# Patient Record
Sex: Female | Born: 1960 | Race: White | Hispanic: No | Smoking: Former smoker
Health system: Southern US, Community
[De-identification: ages and names within clinical notes are randomized; demographics above are authoritative.]

## PROBLEM LIST (undated history)

## (undated) DIAGNOSIS — I739 Peripheral vascular disease, unspecified: Secondary | ICD-10-CM

## (undated) DIAGNOSIS — I639 Cerebral infarction, unspecified: Secondary | ICD-10-CM

## (undated) DIAGNOSIS — D649 Anemia, unspecified: Secondary | ICD-10-CM

## (undated) DIAGNOSIS — C801 Malignant (primary) neoplasm, unspecified: Secondary | ICD-10-CM

## (undated) DIAGNOSIS — J189 Pneumonia, unspecified organism: Secondary | ICD-10-CM

## (undated) DIAGNOSIS — K869 Disease of pancreas, unspecified: Secondary | ICD-10-CM

## (undated) DIAGNOSIS — J449 Chronic obstructive pulmonary disease, unspecified: Secondary | ICD-10-CM

## (undated) DIAGNOSIS — R0600 Dyspnea, unspecified: Secondary | ICD-10-CM

## (undated) DIAGNOSIS — M1990 Unspecified osteoarthritis, unspecified site: Secondary | ICD-10-CM

## (undated) DIAGNOSIS — I10 Essential (primary) hypertension: Secondary | ICD-10-CM

## (undated) DIAGNOSIS — G629 Polyneuropathy, unspecified: Secondary | ICD-10-CM

## (undated) DIAGNOSIS — E039 Hypothyroidism, unspecified: Secondary | ICD-10-CM

## (undated) DIAGNOSIS — Z8489 Family history of other specified conditions: Secondary | ICD-10-CM

## (undated) HISTORY — PX: CARDIAC CATHETERIZATION: SHX172

---

## 2005-08-15 ENCOUNTER — Ambulatory Visit: Payer: Self-pay | Admitting: Internal Medicine

## 2008-04-26 ENCOUNTER — Emergency Department (HOSPITAL_BASED_OUTPATIENT_CLINIC_OR_DEPARTMENT_OTHER): Admission: EM | Admit: 2008-04-26 | Discharge: 2008-04-26 | Payer: Self-pay | Admitting: Emergency Medicine

## 2010-12-13 ENCOUNTER — Emergency Department (INDEPENDENT_AMBULATORY_CARE_PROVIDER_SITE_OTHER): Payer: BC Managed Care – PPO

## 2010-12-13 ENCOUNTER — Emergency Department (HOSPITAL_BASED_OUTPATIENT_CLINIC_OR_DEPARTMENT_OTHER)
Admission: EM | Admit: 2010-12-13 | Discharge: 2010-12-14 | Disposition: A | Discharge status: Home / Self Care | Payer: BC Managed Care – PPO | Attending: Emergency Medicine | Admitting: Emergency Medicine

## 2010-12-13 DIAGNOSIS — Z79899 Other long term (current) drug therapy: Secondary | ICD-10-CM | POA: Insufficient documentation

## 2010-12-13 DIAGNOSIS — R109 Unspecified abdominal pain: Secondary | ICD-10-CM

## 2010-12-13 DIAGNOSIS — K861 Other chronic pancreatitis: Secondary | ICD-10-CM

## 2010-12-13 DIAGNOSIS — K7689 Other specified diseases of liver: Secondary | ICD-10-CM | POA: Insufficient documentation

## 2010-12-13 DIAGNOSIS — K8689 Other specified diseases of pancreas: Secondary | ICD-10-CM | POA: Insufficient documentation

## 2010-12-13 DIAGNOSIS — E119 Type 2 diabetes mellitus without complications: Secondary | ICD-10-CM | POA: Insufficient documentation

## 2010-12-13 LAB — COMPREHENSIVE METABOLIC PANEL
AST: 18 U/L (ref 0–37)
Alkaline Phosphatase: 73 U/L (ref 39–117)
BUN: 9 mg/dL (ref 6–23)
CO2: 22 mEq/L (ref 19–32)
Creatinine, Ser: 0.6 mg/dL (ref 0.4–1.2)
GFR calc non Af Amer: >60 mL/min (ref >60)
Glucose, Bld: 314 mg/dL — ABNORMAL HIGH (ref 70–99)
Sodium: 133 mEq/L — ABNORMAL LOW (ref 135–145)
Total Bilirubin: 0.3 mg/dL (ref 0.3–1.2)

## 2010-12-13 LAB — URINALYSIS, ROUTINE W REFLEX MICROSCOPIC
Bilirubin Urine: NEGATIVE
Glucose, UA: >1000 mg/dL — AB
Leukocytes, UA: NEGATIVE
Nitrite: NEGATIVE
Protein, ur: 100 mg/dL — AB

## 2010-12-13 LAB — URINE MICROSCOPIC-ADD ON

## 2010-12-13 LAB — CBC
MCHC: 35.4 g/dL (ref 30.0–36.0)
MCV: 86.5 fL (ref 78.0–100.0)
Platelets: 264 10*3/uL (ref 150–400)
RDW: 13.4 % (ref 11.5–15.5)
WBC: 6.7 10*3/uL (ref 4.0–10.5)

## 2010-12-13 LAB — DIFFERENTIAL
Eosinophils Absolute: 0.3 10*3/uL (ref 0.0–0.7)
Eosinophils Relative: 5 % (ref 0–5)
Lymphocytes Relative: 39 % (ref 12–46)
Lymphs Abs: 2.7 10*3/uL (ref 0.7–4.0)
Monocytes Relative: 7 % (ref 3–12)
Neutro Abs: 3.3 10*3/uL (ref 1.7–7.7)

## 2010-12-13 LAB — LIPASE, BLOOD: Lipase: 5 U/L — ABNORMAL LOW (ref 11–59)

## 2010-12-14 ENCOUNTER — Encounter (HOSPITAL_BASED_OUTPATIENT_CLINIC_OR_DEPARTMENT_OTHER): Payer: Self-pay | Admitting: Radiology

## 2010-12-14 MED ORDER — IOHEXOL 300 MG/ML  SOLN
100.0000 mL | Freq: Once | INTRAMUSCULAR | Status: AC | PRN
  Administered 2010-12-14: 100 mL via INTRAVENOUS

## 2011-04-15 LAB — URINALYSIS, ROUTINE W REFLEX MICROSCOPIC
Hgb urine dipstick: NEGATIVE
Protein, ur: NEGATIVE
Specific Gravity, Urine: 1.012

## 2011-04-15 LAB — COMPREHENSIVE METABOLIC PANEL
AST: 24
Albumin: 3.7
Alkaline Phosphatase: 64
BUN: 9
Calcium: 8.9
GFR calc Af Amer: >60
Glucose, Bld: 155 — ABNORMAL HIGH
Sodium: 134 — ABNORMAL LOW
Total Protein: 6.3

## 2011-04-15 LAB — CBC
Hemoglobin: 13.2
MCHC: 34
MCV: 88.5
RBC: 4.37
RDW: 13.3
WBC: 4.5

## 2012-03-07 ENCOUNTER — Emergency Department (HOSPITAL_BASED_OUTPATIENT_CLINIC_OR_DEPARTMENT_OTHER)
Admission: EM | Admit: 2012-03-07 | Discharge: 2012-03-07 | Disposition: A | Discharge status: Home / Self Care | Payer: BC Managed Care – PPO | Attending: Emergency Medicine | Admitting: Emergency Medicine

## 2012-03-07 ENCOUNTER — Encounter (HOSPITAL_BASED_OUTPATIENT_CLINIC_OR_DEPARTMENT_OTHER): Payer: Self-pay | Admitting: *Deleted

## 2012-03-07 ENCOUNTER — Emergency Department (HOSPITAL_BASED_OUTPATIENT_CLINIC_OR_DEPARTMENT_OTHER): Payer: BC Managed Care – PPO

## 2012-03-07 DIAGNOSIS — E119 Type 2 diabetes mellitus without complications: Secondary | ICD-10-CM | POA: Insufficient documentation

## 2012-03-07 DIAGNOSIS — Z794 Long term (current) use of insulin: Secondary | ICD-10-CM | POA: Insufficient documentation

## 2012-03-07 DIAGNOSIS — R109 Unspecified abdominal pain: Secondary | ICD-10-CM | POA: Insufficient documentation

## 2012-03-07 HISTORY — DX: Polyneuropathy, unspecified: G62.9

## 2012-03-07 HISTORY — DX: Disease of pancreas, unspecified: K86.9

## 2012-03-07 LAB — URINALYSIS, ROUTINE W REFLEX MICROSCOPIC
Bilirubin Urine: NEGATIVE
Glucose, UA: 500 mg/dL — AB
Nitrite: NEGATIVE
Specific Gravity, Urine: 1.007 (ref 1.005–1.030)
Urobilinogen, UA: 0.2 mg/dL (ref 0.0–1.0)

## 2012-03-07 LAB — PREGNANCY, URINE: Preg Test, Ur: NEGATIVE

## 2012-03-07 NOTE — ED Provider Notes (Addendum)
History     CSN: [id number]  Arrival date & time 03/07/12  0025   First MD Initiated Contact with Patient 03/07/12 0309      Chief Complaint  Patient presents with  . Abdominal Pain    (Consider location/radiation/quality/duration/timing/severity/associated sxs/prior treatment) HPI This is a 51 year old white female with a history of diabetes and pancreatic excursion failure. She states that she often develops a pain in her right flank after exertion or stress. She helped her daughter move furniture 3 days ago. She has subsequently had pain in her right flank. The pain is severe, superficial, well localized and located just inferior to the ribs along the mid axillary line. It is worse with movement or palpation as well as deep breathing. There is no associated nausea, vomiting, diarrhea, fever or chills. There's been no hematuria.  Past Medical History  Diagnosis Date  . Diabetes mellitus   . Pancreas disorder   . Neuropathy     History reviewed. No pertinent past surgical history.  History reviewed. No pertinent family history.  History  Substance Use Topics  . Smoking status: Current Everyday Smoker  . Smokeless tobacco: Not on file  . Alcohol Use: No    OB History    Grav Para Term Preterm Abortions TAB SAB Ect Mult Living                  Review of Systems  All other systems reviewed and are negative.    Allergies  Review of patient's allergies indicates no known allergies.  Home Medications   Current Outpatient Rx  Name Route Sig Dispense Refill  . ASPIRIN 81 MG PO CHEW Oral Chew 81 mg by mouth daily.    Marland Kitchen VITAMIN D PO Oral Take 1 tablet by mouth.    Marland Kitchen CITALOPRAM HYDROBROMIDE 20 MG PO TABS Oral Take 20 mg by mouth daily.    Marland Kitchen GABAPENTIN 100 MG PO CAPS Oral Take 200 mg by mouth 3 (three) times daily.    . INSULIN GLARGINE 100 UNIT/ML [place] SOLN Subcutaneous Inject 20 Units into the skin daily.    . INSULIN LISPRO (HUMAN) 100 UNIT/ML [place] SOLN Subcutaneous  Inject 3-5 Units into the skin 3 (three) times daily before meals.    Marland Kitchen LOSARTAN POTASSIUM 50 MG PO TABS Oral Take 50 mg by mouth daily.    . MELOXICAM 7.5 MG PO TABS Oral Take 7.5 mg by mouth daily.    Marland Kitchen MIRABEGRON ER 25 MG PO TB24 Oral Take 25 mg by mouth daily.    . MULTIVITAMINS PO CAPS Oral Take 1 capsule by mouth daily.    Marland Kitchen PANCRELIPASE (LIP-PROT-AMYL) 24000 UNITS PO CPEP Oral Take 48,000 Units by mouth 3 (three) times daily with meals.    Marland Kitchen SIMVASTATIN 40 MG PO TABS Oral Take 40 mg by mouth every evening.      BP 167/75  Pulse 74  Temp 97.7 F (36.5 C) (Oral)  Resp 20  Ht 5\' 9"  (1.753 m)  Wt 150 lb (68.04 kg)  BMI 22.15 kg/m2  SpO2 99%  LMP 02/29/2012  Physical Exam General: Well-developed, well-nourished female in no acute distress; appearance consistent with age of record HENT: normocephalic, atraumatic Eyes: pupils equal round and reactive to light; extraocular muscles intact Neck: supple Heart: regular rate and rhythm Lungs: clear to auscultation bilaterally Abdomen: soft; nondistended; no masses or hepatosplenomegaly; bowel sounds present; Well localized pain just inferior to the ribs along the right midaxillary line without mass or swelling; lesser  tenderness laterally over right kidney Extremities: No deformity; full range of motion; pulses normal; no edema Neurologic: Awake, alert and oriented; motor function intact in all extremities and symmetric; no facial droop Skin: Warm and dry Psychiatric: Normal mood and affect    ED Course  Procedures (including critical care time)     MDM   Nursing notes and vitals signs, including pulse oximetry, reviewed.  Summary of this visit's results, reviewed by myself:  Labs:  Results for orders placed during the hospital encounter of 03/07/12  GLUCOSE, CAPILLARY      Component Value Range   Glucose-Capillary 366 (*) 70 - 99 mg/dL  URINALYSIS, ROUTINE W REFLEX MICROSCOPIC      Component Value Range   Color,  Urine YELLOW  YELLOW   APPearance CLEAR  CLEAR   Specific Gravity, Urine 1.007  1.005 - 1.030   pH 6.5  5.0 - 8.0   Glucose, UA 500 (*) NEGATIVE mg/dL   Hgb urine dipstick NEGATIVE  NEGATIVE   Bilirubin Urine NEGATIVE  NEGATIVE   Ketones, ur NEGATIVE  NEGATIVE mg/dL   Protein, ur NEGATIVE  NEGATIVE mg/dL   Urobilinogen, UA 0.2  0.0 - 1.0 mg/dL   Nitrite NEGATIVE  NEGATIVE   Leukocytes, UA NEGATIVE  NEGATIVE  PREGNANCY, URINE      Component Value Range   Preg Test, Ur NEGATIVE  NEGATIVE    Imaging Studies: Ct Abdomen Pelvis Wo Contrast  03/07/2012  *RADIOLOGY REPORT*  Clinical Data: Right flank pain  CT ABDOMEN AND PELVIS WITHOUT CONTRAST  Technique:  Multidetector CT imaging of the abdomen and pelvis was performed following the standard protocol without intravenous contrast.  Comparison: 12/13/2010  Findings: Linear opacities within the lingula and middle lobe, likely scarring.  Heart size within normal limits.  No pleural or pericardial effusion.  Organ abnormality/lesion detection is limited in the absence of intravenous contrast. Within this limitation, calcified lesions within the liver and mesentery, similar to prior, likely sequelae of prior granulomas infection.  Unremarkable spleen.  Atrophy of the pancreas with coarse calcifications which may reflect sequelae of chronic pancreatitis. Unremarkable biliary system and adrenal glands.  There is mild perinephric fat stranding, right greater than left. No hydronephrosis or hydroureter.  Unable to follow the distal ureteral course in their entirety due to the decompressed state and adjacent bowel loops.  Allowing for this, no calcification along the expected course.  No bowel obstruction.  No CT evidence for colitis.  Appendix not identified.  No right lower quadrant inflammation.  No free intraperitoneal air or fluid.  Advanced atherosclerosis of the aorta and branch vessels.  Further vascular evaluation is not possible given the absence of  intravenous contrast.  Circumferential bladder wall thickening is nonspecific.  Trace free fluid within the pelvis.  Nonspecific appearance to the uterus.  No adnexal mass identified.  No acute osseous finding.  Lumbosacral degenerative changes.  IMPRESSION: Mild perinephric fat stranding, right greater than left, may reflect ascending infection/pyelonephritis.  Correlate with urinalysis. No hydronephrosis or hydroureter.  Sequelae of prior granulomas infection.  Sequelae of chronic pancreatitis.   Original Report Authenticated By: Waneta Martins, M.D.    4:16 AM Patient advised of CT and lab findings. She declines narcotic pain medication.        Hanley Seamen, MD 03/07/12 0414  Hanley Seamen, MD 03/07/12 [phone]

## 2012-03-07 NOTE — ED Notes (Signed)
Pt states she has had episodes similar to this before, but they usually resolve. Occurs after strenuous exercise.Helped daughter move on Thursday and has had pain since. Hurts to cough, breathe and palpate. Also has hx of pancreatic failure.

## 2012-12-25 ENCOUNTER — Encounter (HOSPITAL_BASED_OUTPATIENT_CLINIC_OR_DEPARTMENT_OTHER): Payer: Self-pay | Admitting: *Deleted

## 2012-12-25 ENCOUNTER — Emergency Department (HOSPITAL_BASED_OUTPATIENT_CLINIC_OR_DEPARTMENT_OTHER)
Admission: EM | Admit: 2012-12-25 | Discharge: 2012-12-25 | Disposition: A | Discharge status: Home / Self Care | Payer: BC Managed Care – PPO | Attending: Emergency Medicine | Admitting: Emergency Medicine

## 2012-12-25 DIAGNOSIS — F17200 Nicotine dependence, unspecified, uncomplicated: Secondary | ICD-10-CM | POA: Insufficient documentation

## 2012-12-25 DIAGNOSIS — Z8719 Personal history of other diseases of the digestive system: Secondary | ICD-10-CM | POA: Insufficient documentation

## 2012-12-25 DIAGNOSIS — R209 Unspecified disturbances of skin sensation: Secondary | ICD-10-CM | POA: Insufficient documentation

## 2012-12-25 DIAGNOSIS — M5412 Radiculopathy, cervical region: Secondary | ICD-10-CM

## 2012-12-25 DIAGNOSIS — Z794 Long term (current) use of insulin: Secondary | ICD-10-CM | POA: Insufficient documentation

## 2012-12-25 DIAGNOSIS — M542 Cervicalgia: Secondary | ICD-10-CM | POA: Insufficient documentation

## 2012-12-25 DIAGNOSIS — E119 Type 2 diabetes mellitus without complications: Secondary | ICD-10-CM | POA: Insufficient documentation

## 2012-12-25 DIAGNOSIS — Z8669 Personal history of other diseases of the nervous system and sense organs: Secondary | ICD-10-CM | POA: Insufficient documentation

## 2012-12-25 DIAGNOSIS — Z79899 Other long term (current) drug therapy: Secondary | ICD-10-CM | POA: Insufficient documentation

## 2012-12-25 DIAGNOSIS — Z7982 Long term (current) use of aspirin: Secondary | ICD-10-CM | POA: Insufficient documentation

## 2012-12-25 MED ORDER — PREDNISONE 20 MG PO TABS: 60.0000 mg | ORAL_TABLET | Freq: Every day | ORAL | Status: DC

## 2012-12-25 MED ORDER — KETOROLAC TROMETHAMINE 60 MG/2ML IM SOLN
60.0000 mg | Freq: Once | INTRAMUSCULAR | Status: AC
  Administered 2012-12-25: 60 mg via INTRAMUSCULAR
  Filled 2012-12-25: qty 2

## 2012-12-25 MED ORDER — PREDNISONE 50 MG PO TABS
60.0000 mg | ORAL_TABLET | Freq: Once | ORAL | Status: AC
  Administered 2012-12-25: 60 mg via ORAL
  Filled 2012-12-25: qty 1

## 2012-12-25 MED ORDER — OXYCODONE-ACETAMINOPHEN 5-325 MG PO TABS: 1.0000 | ORAL_TABLET | Freq: Four times a day (QID) | ORAL | Status: DC | PRN

## 2012-12-25 MED ORDER — HYDROMORPHONE HCL PF 2 MG/ML IJ SOLN
2.0000 mg | Freq: Once | INTRAMUSCULAR | Status: AC
  Administered 2012-12-25: 2 mg via INTRAMUSCULAR
  Filled 2012-12-25: qty 1

## 2012-12-25 NOTE — ED Notes (Signed)
Pt states about 3 weeks ago, she was raking and started having some right shoulder pain. Took Tylenol and Ibuprofen as well as using ice. Got better, but s/s returned June 3rd. Pain radiates down arm and into hand as well as up into neck. "Weight on right side of body" Taking daughter's Dilaudid and muscle relaxers without relief. EKG done at triage.

## 2012-12-25 NOTE — ED Provider Notes (Signed)
History     This chart was scribed for Michelle Teall B. Bernette Mayers, MD by Jiles Prows, ED Scribe. The patient was seen in room MH04/MH04 and the patient's care was started at 6:50 PM.  CSN: [id number]  Arrival date & time 12/25/12  1820   Chief Complaint  Patient presents with  . Shoulder Pain   The history is provided by the patient and medical records. No language interpreter was used.   HPI Comments: Michelle Nicholson is a 52 y.o. female who presents to the Emergency Department complaining of moderate to severe, constant, radiating right shoulder pain onset 2.5 weeks ago.  Pt reports the pain radiates down her arm and her fingers are tingling.  She also states that the pain also radiates into her neck.  Pt denies headache, diaphoresis, fever, chills, nausea, vomiting, diarrhea, weakness, cough, SOB and any other pain.   Pt reports she has an appointment with [facility] on Monday.  Past Medical History  Diagnosis Date  . Diabetes mellitus   . Pancreas disorder   . Neuropathy     History reviewed. No pertinent past surgical history.  History reviewed. No pertinent family history.  History  Substance Use Topics  . Smoking status: Current Every Day Smoker  . Smokeless tobacco: Not on file  . Alcohol Use: No    OB History   Grav Para Term Preterm Abortions TAB SAB Ect Mult Living                  Review of Systems  Constitutional: Negative for fever and chills.  HENT: Positive for neck pain. Negative for sore throat and mouth sores.   Respiratory: Negative for cough and shortness of breath.   Gastrointestinal: Negative for nausea, vomiting and diarrhea.  Musculoskeletal: Negative for myalgias and back pain.  Skin: Negative for pallor and rash.  Neurological: Negative for seizures and syncope.   A complete 10 system review of systems was obtained and all systems are negative except as noted in the HPI and PMH.   Allergies  Review of patient's allergies indicates no known  allergies.  Home Medications   Current Outpatient Rx  Name  Route  Sig  Dispense  Refill  . aspirin 81 MG chewable tablet   Oral   Chew 81 mg by mouth daily.         . Cholecalciferol (VITAMIN D PO)   Oral   Take 1 tablet by mouth.         . citalopram (CELEXA) 20 MG tablet   Oral   Take 20 mg by mouth daily.         Marland Kitchen gabapentin (NEURONTIN) 100 MG capsule   Oral   Take 200 mg by mouth 3 (three) times daily.         . insulin glargine (LANTUS) 100 UNIT/ML injection   Subcutaneous   Inject 20 Units into the skin daily.         . insulin lispro (HUMALOG) 100 UNIT/ML injection   Subcutaneous   Inject 3-5 Units into the skin 3 (three) times daily before meals.         Marland Kitchen losartan (COZAAR) 50 MG tablet   Oral   Take 50 mg by mouth daily.         . meloxicam (MOBIC) 7.5 MG tablet   Oral   Take 7.5 mg by mouth daily.         . mirabegron ER (MYRBETRIQ) 25 MG TB24  Oral   Take 25 mg by mouth daily.         . Multiple Vitamin (MULTIVITAMIN) capsule   Oral   Take 1 capsule by mouth daily.         . Pancrelipase, Lip-Prot-Amyl, (CREON) 24000 UNITS CPEP   Oral   Take 48,000 Units by mouth 3 (three) times daily with meals.         . simvastatin (ZOCOR) 40 MG tablet   Oral   Take 40 mg by mouth every evening.           BP 154/60  Pulse 78  Temp(Src) 98.8 F (37.1 C) (Oral)  Resp 20  Ht 5\' 9"  (1.753 m)  Wt 155 lb (70.308 kg)  BMI 22.88 kg/m2  SpO2 98%  Physical Exam  Nursing note and vitals reviewed. Constitutional: She is oriented to person, place, and time. She appears well-developed and well-nourished.  HENT:  Head: Normocephalic and atraumatic.  Eyes: EOM are normal. Pupils are equal, round, and reactive to light.  Neck: Normal range of motion. Neck supple.  Cardiovascular: Normal rate, normal heart sounds and intact distal pulses.   Pulmonary/Chest: Effort normal and breath sounds normal.  Abdominal: Bowel sounds are normal. She  exhibits no distension. There is no tenderness.  Musculoskeletal: Normal range of motion. She exhibits tenderness (Soft tissue tenderness in right cerivcal paraspinal muscles). She exhibits no edema.  Neurological: She is alert and oriented to person, place, and time. She has normal strength. No cranial nerve deficit or sensory deficit.  Skin: Skin is warm and dry. No rash noted.  Psychiatric: She has a normal mood and affect.   ED Course  Procedures (including critical care time) DIAGNOSTIC STUDIES: Oxygen Saturation is 98% on RA, normal by my interpretation.    COORDINATION OF CARE: 6:52 PM - Discussed ED treatment with pt at bedside including pain medication and steroids and pt agrees.   Labs Reviewed - No data to display No results found.  1. Cervical radiculopathy     MDM  Pt feeling some better, suspect radiculopathy from muscle spasm. Will d/c with prednisone,percocet, she already has ortho follow up scheduled.      I personally performed the services described in this documentation, which was scribed in my presence. The recorded information has been reviewed and is accurate.     Sophi Calligan B. Bernette Mayers, MD 12/25/12 2025

## 2012-12-29 ENCOUNTER — Emergency Department (HOSPITAL_COMMUNITY): Payer: BC Managed Care – PPO

## 2012-12-29 ENCOUNTER — Encounter (HOSPITAL_COMMUNITY): Payer: Self-pay | Admitting: *Deleted

## 2012-12-29 ENCOUNTER — Emergency Department (HOSPITAL_COMMUNITY)
Admission: EM | Admit: 2012-12-29 | Discharge: 2012-12-29 | Disposition: A | Discharge status: Home / Self Care | Payer: BC Managed Care – PPO | Attending: Emergency Medicine | Admitting: Emergency Medicine

## 2012-12-29 DIAGNOSIS — I10 Essential (primary) hypertension: Secondary | ICD-10-CM | POA: Insufficient documentation

## 2012-12-29 DIAGNOSIS — Z794 Long term (current) use of insulin: Secondary | ICD-10-CM | POA: Insufficient documentation

## 2012-12-29 DIAGNOSIS — R5381 Other malaise: Secondary | ICD-10-CM | POA: Insufficient documentation

## 2012-12-29 DIAGNOSIS — E119 Type 2 diabetes mellitus without complications: Secondary | ICD-10-CM | POA: Insufficient documentation

## 2012-12-29 DIAGNOSIS — R531 Weakness: Secondary | ICD-10-CM

## 2012-12-29 DIAGNOSIS — R5383 Other fatigue: Secondary | ICD-10-CM | POA: Insufficient documentation

## 2012-12-29 DIAGNOSIS — F17200 Nicotine dependence, unspecified, uncomplicated: Secondary | ICD-10-CM | POA: Insufficient documentation

## 2012-12-29 DIAGNOSIS — Z79899 Other long term (current) drug therapy: Secondary | ICD-10-CM | POA: Insufficient documentation

## 2012-12-29 DIAGNOSIS — G609 Hereditary and idiopathic neuropathy, unspecified: Secondary | ICD-10-CM | POA: Insufficient documentation

## 2012-12-29 DIAGNOSIS — Z7982 Long term (current) use of aspirin: Secondary | ICD-10-CM | POA: Insufficient documentation

## 2012-12-29 HISTORY — DX: Essential (primary) hypertension: I10

## 2012-12-29 LAB — CBC WITH DIFFERENTIAL/PLATELET
Basophils Absolute: 0 10*3/uL (ref 0.0–0.1)
Basophils Relative: 0 % (ref 0–1)
Eosinophils Absolute: 0.1 10*3/uL (ref 0.0–0.7)
Eosinophils Relative: 1 % (ref 0–5)
HCT: 38.5 % (ref 36.0–46.0)
Hemoglobin: 13.6 g/dL (ref 12.0–15.0)
Lymphocytes Relative: 44 % (ref 12–46)
Lymphs Abs: 5.6 10*3/uL — ABNORMAL HIGH (ref 0.7–4.0)
MCH: 30.9 pg (ref 26.0–34.0)
MCHC: 35.3 g/dL (ref 30.0–36.0)
MCV: 87.5 fL (ref 78.0–100.0)
Monocytes Absolute: 0.9 10*3/uL (ref 0.1–1.0)
Monocytes Relative: 7 % (ref 3–12)
Neutro Abs: 6.2 10*3/uL (ref 1.7–7.7)
Neutrophils Relative %: 49 % (ref 43–77)
Platelets: 277 10*3/uL (ref 150–400)
RBC: 4.4 MIL/uL (ref 3.87–5.11)
RDW: 12.9 % (ref 11.5–15.5)
WBC: 12.7 10*3/uL — ABNORMAL HIGH (ref 4.0–10.5)

## 2012-12-29 LAB — URINALYSIS, ROUTINE W REFLEX MICROSCOPIC
Glucose, UA: 100 mg/dL — AB
Ketones, ur: NEGATIVE mg/dL
Nitrite: NEGATIVE
Protein, ur: 100 mg/dL — AB
Specific Gravity, Urine: 1.028 (ref 1.005–1.030)
Urobilinogen, UA: 1 mg/dL (ref 0.0–1.0)
pH: 6.5 (ref 5.0–8.0)

## 2012-12-29 LAB — URINE MICROSCOPIC-ADD ON

## 2012-12-29 LAB — CG4 I-STAT (LACTIC ACID): Lactic Acid, Venous: 1.26 mmol/L (ref 0.5–2.2)

## 2012-12-29 LAB — COMPREHENSIVE METABOLIC PANEL
ALT: 19 U/L (ref 0–35)
AST: 14 U/L (ref 0–37)
Albumin: 3.2 g/dL — ABNORMAL LOW (ref 3.5–5.2)
Alkaline Phosphatase: 64 U/L (ref 39–117)
BUN: 19 mg/dL (ref 6–23)
CO2: 25 mEq/L (ref 19–32)
Calcium: 8.5 mg/dL (ref 8.4–10.5)
Chloride: 101 mEq/L (ref 96–112)
Creatinine, Ser: 0.78 mg/dL (ref 0.50–1.10)
GFR calc Af Amer: >90 mL/min (ref >90)
GFR calc non Af Amer: >90 mL/min (ref >90)
Glucose, Bld: 154 mg/dL — ABNORMAL HIGH (ref 70–99)
Potassium: 3.4 mEq/L — ABNORMAL LOW (ref 3.5–5.1)
Sodium: 137 mEq/L (ref 135–145)
Total Bilirubin: 0.3 mg/dL (ref 0.3–1.2)
Total Protein: 6.2 g/dL (ref 6.0–8.3)

## 2012-12-29 LAB — GLUCOSE, CAPILLARY: Glucose-Capillary: 161 mg/dL — ABNORMAL HIGH (ref 70–99)

## 2012-12-29 LAB — TROPONIN I: Troponin I: <0.3 ng/mL (ref <0.30)

## 2012-12-29 MED ORDER — SODIUM CHLORIDE 0.9 % IV BOLUS (SEPSIS)
1000.0000 mL | Freq: Once | INTRAVENOUS | Status: AC
  Administered 2012-12-29: 1000 mL via INTRAVENOUS

## 2012-12-29 MED ORDER — NALOXONE HCL 0.4 MG/ML IJ SOLN
0.4000 mg | Freq: Once | INTRAMUSCULAR | Status: AC
  Administered 2012-12-29: 0.4 mg via INTRAVENOUS
  Filled 2012-12-29: qty 1

## 2012-12-29 MED ORDER — ONDANSETRON HCL 4 MG/2ML IJ SOLN
4.0000 mg | Freq: Once | INTRAMUSCULAR | Status: AC
  Administered 2012-12-29: 4 mg via INTRAVENOUS
  Filled 2012-12-29: qty 2

## 2012-12-29 NOTE — ED Provider Notes (Signed)
History    51yf brought in for evaluation after seeming very tired at [employer] [address] through and then subsequently driving over the curb. Pt states she feels tired. C/o neck pain as well, but this is chronic. Taking percocet for "pinched nerves." Unable/unwilling to tell how many specifically ingested today. Denies overdose though. Denies new pain complaints. NO fever or chills. Did not eat yet today. No sob. No urinary complaints. No recent falls.  CSN: [id number]  Arrival date & time 12/29/12  1406   First MD Initiated Contact with Patient 12/29/12 1413      Chief Complaint  Patient presents with  . Hypotension    (Consider location/radiation/quality/duration/timing/severity/associated sxs/prior treatment) HPI  Past Medical History  Diagnosis Date  . Diabetes mellitus   . Pancreas disorder   . Neuropathy   . Hypertension     History reviewed. No pertinent past surgical history.  History reviewed. No pertinent family history.  History  Substance Use Topics  . Smoking status: Current Every Day Smoker  . Smokeless tobacco: Not on file  . Alcohol Use: No    OB History   Grav Para Term Preterm Abortions TAB SAB Ect Mult Living                  Review of Systems  All systems reviewed and negative, other than as noted in HPI.  Allergies  Review of patient's allergies indicates no known allergies.  Home Medications   Current Outpatient Rx  Name  Route  Sig  Dispense  Refill  . aspirin 81 MG chewable tablet   Oral   Chew 81 mg by mouth daily.         . Cholecalciferol (VITAMIN D PO)   Oral   Take 1 tablet by mouth.         . citalopram (CELEXA) 20 MG tablet   Oral   Take 20 mg by mouth daily.         Marland Kitchen gabapentin (NEURONTIN) 100 MG capsule   Oral   Take 200 mg by mouth 3 (three) times daily.         . insulin glargine (LANTUS) 100 UNIT/ML injection   Subcutaneous   Inject 20 Units into the skin daily.         . insulin lispro (HUMALOG) 100  UNIT/ML injection   Subcutaneous   Inject 3-5 Units into the skin 3 (three) times daily before meals.         Marland Kitchen losartan (COZAAR) 50 MG tablet   Oral   Take 50 mg by mouth daily.         . meloxicam (MOBIC) 7.5 MG tablet   Oral   Take 7.5 mg by mouth daily.         . mirabegron ER (MYRBETRIQ) 25 MG TB24   Oral   Take 25 mg by mouth daily.         . Multiple Vitamin (MULTIVITAMIN) capsule   Oral   Take 1 capsule by mouth daily.         Marland Kitchen oxyCODONE-acetaminophen (PERCOCET/ROXICET) 5-325 MG per tablet   Oral   Take 1-2 tablets by mouth every 6 (six) hours as needed for pain.   20 tablet   0   . Pancrelipase, Lip-Prot-Amyl, (CREON) 24000 UNITS CPEP   Oral   Take 48,000 Units by mouth 3 (three) times daily with meals.         . predniSONE (DELTASONE) 20 MG tablet   Oral  Take 3 tablets (60 mg total) by mouth daily.   15 tablet   0   . simvastatin (ZOCOR) 40 MG tablet   Oral   Take 40 mg by mouth every evening.           BP 119/60  Pulse 55  Temp(Src) 97.6 F (36.4 C) (Oral)  Resp 20  SpO2 96%  Physical Exam  Nursing note and vitals reviewed. Constitutional: She is oriented to person, place, and time. She appears well-developed and well-nourished. No distress.  HENT:  Head: Normocephalic and atraumatic.  Eyes: Conjunctivae are normal. Right eye exhibits no discharge. Left eye exhibits no discharge.  Neck: Neck supple.  Cardiovascular: Normal rate, regular rhythm and normal heart sounds.  Exam reveals no gallop and no friction rub.   No murmur heard. Pulmonary/Chest: Effort normal and breath sounds normal. No respiratory distress.  Abdominal: Soft. She exhibits no distension. There is no tenderness.  Musculoskeletal: She exhibits no edema and no tenderness.  Neurological: She is oriented to person, place, and time. No cranial nerve deficit. She exhibits normal muscle tone. Coordination normal.  Drowsy but answers questions appropriately. No  deficits noted.   Skin: Skin is warm and dry.  Psychiatric: She has a normal mood and affect. Her behavior is normal. Thought content normal.    ED Course  Procedures (including critical care time)  Labs Reviewed  GLUCOSE, CAPILLARY - Abnormal; Notable for the following:    Glucose-Capillary 161 (*)    All other components within normal limits  COMPREHENSIVE METABOLIC PANEL - Abnormal; Notable for the following:    Potassium 3.4 (*)    Glucose, Bld 154 (*)    Albumin 3.2 (*)    All other components within normal limits  CBC WITH DIFFERENTIAL - Abnormal; Notable for the following:    WBC 12.7 (*)    Lymphs Abs 5.6 (*)    All other components within normal limits  TROPONIN I  URINALYSIS, ROUTINE W REFLEX MICROSCOPIC  CG4 I-STAT (LACTIC ACID)   Dg Chest Portable 1 View  12/29/2012   *RADIOLOGY REPORT*  Clinical Data: Hypotension  PORTABLE CHEST - 1 VIEW  Comparison: None.  Findings: Frontal view of the chest was obtained and reveals heart pulmonary vascularity be within normal limits.  The lungs are clear bilaterally.  No bony abnormality is seen.  IMPRESSION: No acute abnormality noted.   Original Report Authenticated By: Alcide Clever, M.D.   EKG:  Rhythm: sinus bradycardia Vent. rate 56 BPM PR interval 156 ms QRS duration 88 ms QT/QTc 436/421 ms ST segments: NS ST changes   1. Generalized weakness       MDM  51yF with generalized weakness/fatigue. Suspect pt overmedicated. W/u fairly unremarkable. UA with many bacteria, but also many squamous cells. W/o specific urinary complaints, tx deferred. Pt HD stable. She ambulated slowly, but w/o assistance prior to DC.  Protecting airway. Pt cautioned about potential sedating effects of meds. I feel safe for DC at this time.         Raeford Razor, MD 01/06/13 [phone]

## 2012-12-29 NOTE — ED Notes (Signed)
md at bedside

## 2012-12-29 NOTE — ED Notes (Addendum)
Per ems pt reports she started taking pain medication for pinched nerves on Saturday. Today pt went through [employer] [address] through, staff thought she was acting off, she jumped the curve and went into another parking lot. ems was called. Upon arrival. Pt hypotensive and lethargic. 18 G L AC, 1 L NS infused. CBG 182. Pt more alert at present, answering questions appropriately. Blood pressure elevated to 138/63.   Hx of DM, has not taken insulin dose today.   Daughter called and will come to ED. Daughter phone number [phone]

## 2012-12-29 NOTE — Progress Notes (Signed)
pt is seen by a Dr Michelle Nicholson in [place] per her daughter, Michelle Nicholson

## 2012-12-29 NOTE — ED Notes (Signed)
Pt ambulated to restroom, states she feels dizzy and nauseous, pt ambulated by self, became unsteady a couple times.

## 2012-12-29 NOTE — ED Notes (Signed)
[id number]<JY> Expected date:<BR> Expected time:<BR> Means of arrival:<BR> Comments:<BR> ems- hypotension, altered in car, weakness

## 2012-12-31 LAB — URINE CULTURE: Colony Count: >=100000

## 2013-01-02 ENCOUNTER — Encounter (HOSPITAL_BASED_OUTPATIENT_CLINIC_OR_DEPARTMENT_OTHER): Payer: Self-pay | Admitting: *Deleted

## 2013-01-02 ENCOUNTER — Emergency Department (HOSPITAL_BASED_OUTPATIENT_CLINIC_OR_DEPARTMENT_OTHER): Payer: BC Managed Care – PPO

## 2013-01-02 ENCOUNTER — Emergency Department (HOSPITAL_BASED_OUTPATIENT_CLINIC_OR_DEPARTMENT_OTHER)
Admission: EM | Admit: 2013-01-02 | Discharge: 2013-01-02 | Disposition: A | Discharge status: Home / Self Care | Payer: BC Managed Care – PPO | Attending: Emergency Medicine | Admitting: Emergency Medicine

## 2013-01-02 DIAGNOSIS — S60219A Contusion of unspecified wrist, initial encounter: Secondary | ICD-10-CM | POA: Insufficient documentation

## 2013-01-02 DIAGNOSIS — Z791 Long term (current) use of non-steroidal anti-inflammatories (NSAID): Secondary | ICD-10-CM | POA: Insufficient documentation

## 2013-01-02 DIAGNOSIS — IMO0002 Reserved for concepts with insufficient information to code with codable children: Secondary | ICD-10-CM | POA: Insufficient documentation

## 2013-01-02 DIAGNOSIS — Y929 Unspecified place or not applicable: Secondary | ICD-10-CM | POA: Insufficient documentation

## 2013-01-02 DIAGNOSIS — Z794 Long term (current) use of insulin: Secondary | ICD-10-CM | POA: Insufficient documentation

## 2013-01-02 DIAGNOSIS — F17200 Nicotine dependence, unspecified, uncomplicated: Secondary | ICD-10-CM | POA: Insufficient documentation

## 2013-01-02 DIAGNOSIS — S52599A Other fractures of lower end of unspecified radius, initial encounter for closed fracture: Secondary | ICD-10-CM | POA: Insufficient documentation

## 2013-01-02 DIAGNOSIS — E119 Type 2 diabetes mellitus without complications: Secondary | ICD-10-CM | POA: Insufficient documentation

## 2013-01-02 DIAGNOSIS — I10 Essential (primary) hypertension: Secondary | ICD-10-CM | POA: Insufficient documentation

## 2013-01-02 DIAGNOSIS — Z7982 Long term (current) use of aspirin: Secondary | ICD-10-CM | POA: Insufficient documentation

## 2013-01-02 DIAGNOSIS — Z8719 Personal history of other diseases of the digestive system: Secondary | ICD-10-CM | POA: Insufficient documentation

## 2013-01-02 DIAGNOSIS — Z8669 Personal history of other diseases of the nervous system and sense organs: Secondary | ICD-10-CM | POA: Insufficient documentation

## 2013-01-02 DIAGNOSIS — Y9389 Activity, other specified: Secondary | ICD-10-CM | POA: Insufficient documentation

## 2013-01-02 DIAGNOSIS — Z79899 Other long term (current) drug therapy: Secondary | ICD-10-CM | POA: Insufficient documentation

## 2013-01-02 DIAGNOSIS — R296 Repeated falls: Secondary | ICD-10-CM | POA: Insufficient documentation

## 2013-01-02 DIAGNOSIS — S52501A Unspecified fracture of the lower end of right radius, initial encounter for closed fracture: Secondary | ICD-10-CM

## 2013-01-02 MED ORDER — HYDROCODONE-ACETAMINOPHEN 5-325 MG PO TABS
1.0000 | ORAL_TABLET | Freq: Once | ORAL | Status: AC
  Administered 2013-01-02: 1 via ORAL
  Filled 2013-01-02: qty 1

## 2013-01-02 MED ORDER — HYDROCODONE-ACETAMINOPHEN 5-325 MG PO TABS: 1.0000 | ORAL_TABLET | ORAL | Status: DC | PRN

## 2013-01-02 NOTE — ED Notes (Signed)
Fell onto her right arm last night. Now c/o pain, swelling, bruising to right wrist

## 2013-01-02 NOTE — ED Notes (Signed)
Called to get disk made for to go with patient

## 2013-01-02 NOTE — ED Provider Notes (Signed)
History    Scribed for Michelle Cooper III, MD, the patient was seen in room MH01/MH01. This chart was scribed by Lewanda Rife, ED scribe. Patient's care was started at 1754   CSN: [id number]  Arrival date & time 01/02/13  1656   None     Chief Complaint  Patient presents with  . Arm Injury    (Consider location/radiation/quality/duration/timing/severity/associated sxs/prior treatment) The history is provided by the patient.   HPI Comments: Michelle Nicholson is a 52 y.o. female who presents to the Emergency Department complaining of constant moderate, non-radiating right wrist pain onset acute last night after breaking her fall with outstretched hand right hand. Reports associated right wrist swelling, and bruising. Reports symptoms are aggravated with touch, movement and alleviated by nothing. Denies associated head injury, or other related injuries. Reports she sees an orthopedist for her right shoulder and has an appointment in 2 days.   Past Medical History  Diagnosis Date  . Diabetes mellitus   . Pancreas disorder   . Neuropathy   . Hypertension     History reviewed. No pertinent past surgical history.  No family history on file.  History  Substance Use Topics  . Smoking status: Current Every Day Smoker  . Smokeless tobacco: Not on file  . Alcohol Use: No    OB History   Grav Para Term Preterm Abortions TAB SAB Ect Mult Living                  Review of Systems  Musculoskeletal: Positive for myalgias (right wrist ).  Skin: Positive for wound (brusing ).  Neurological: Negative for numbness.  Psychiatric/Behavioral: Negative for confusion.   A complete 10 system review of systems was obtained and all systems are negative except as noted in the HPI and PMH.    Allergies  Review of patient's allergies indicates no known allergies.  Home Medications   Current Outpatient Rx  Name  Route  Sig  Dispense  Refill  . aspirin 81 MG chewable tablet   Oral   Chew 81  mg by mouth daily.         . Cholecalciferol (VITAMIN D PO)   Oral   Take 1 tablet by mouth.         . citalopram (CELEXA) 20 MG tablet   Oral   Take 20 mg by mouth daily.         Marland Kitchen gabapentin (NEURONTIN) 100 MG capsule   Oral   Take 200 mg by mouth 3 (three) times daily.         . insulin glargine (LANTUS) 100 UNIT/ML injection   Subcutaneous   Inject 20 Units into the skin daily.         . insulin lispro (HUMALOG) 100 UNIT/ML injection   Subcutaneous   Inject 3-5 Units into the skin 3 (three) times daily before meals.         Marland Kitchen losartan (COZAAR) 50 MG tablet   Oral   Take 50 mg by mouth daily.         . meloxicam (MOBIC) 7.5 MG tablet   Oral   Take 7.5 mg by mouth daily.         . mirabegron ER (MYRBETRIQ) 25 MG TB24   Oral   Take 25 mg by mouth daily.         . Multiple Vitamin (MULTIVITAMIN) capsule   Oral   Take 1 capsule by mouth daily.         Marland Kitchen  oxyCODONE-acetaminophen (PERCOCET/ROXICET) 5-325 MG per tablet   Oral   Take 1-2 tablets by mouth every 6 (six) hours as needed for pain.   20 tablet   0   . Pancrelipase, Lip-Prot-Amyl, (CREON) 24000 UNITS CPEP   Oral   Take 48,000 Units by mouth 3 (three) times daily with meals.         . predniSONE (DELTASONE) 20 MG tablet   Oral   Take 3 tablets (60 mg total) by mouth daily.   15 tablet   0   . simvastatin (ZOCOR) 40 MG tablet   Oral   Take 40 mg by mouth every evening.           There were no vitals taken for this visit.  Physical Exam  Nursing note and vitals reviewed. Constitutional: She is oriented to person, place, and time. She appears well-developed and well-nourished. No distress.  HENT:  Head: Normocephalic and atraumatic.  Eyes: Conjunctivae and EOM are normal.  Neck: Neck supple. No tracheal deviation present.  Cardiovascular: Normal rate and intact distal pulses.   Pulses:      Radial pulses are 2+ on the right side.  Pulmonary/Chest: Effort normal. No  respiratory distress.  Musculoskeletal: She exhibits tenderness.       Right wrist: She exhibits decreased range of motion (secondary to pain ), tenderness, bony tenderness, swelling and deformity. She exhibits no laceration.  Ecchymosis noted on volar surface of right forearm, deformity noted on dorsum of right wrist primarily over distal radius, intact pulse, sensation, and tendon function  Neurological: She is alert and oriented to person, place, and time.  Skin: Skin is warm and dry.  Psychiatric: She has a normal mood and affect. Her behavior is normal.    ED Course  Procedures (including critical care time) Medications  HYDROcodone-acetaminophen (NORCO/VICODIN) 5-325 MG per tablet 1 tablet (1 tablet Oral Given 01/02/13 1810)     Labs Reviewed - No data to display Dg Wrist Complete Right  01/02/2013   *RADIOLOGY REPORT*  Clinical Data: Wrist pain, swelling, and bruising secondary to a fall last night.  RIGHT WRIST - COMPLETE 3+ VIEW  Comparison: None.  Findings: There is a slightly impacted fracture of the metaphysis of the distal radius.  There is a small avulsion of the ulnar styloid.  Minimal arthritic changes of the first carpal metacarpal joint.  IMPRESSION: Fractures of the distal right radius and ulna.   Original Report Authenticated By: Francene Boyers, M.D.   Rx splint, hydrocodone-acetaminophen q4h prn pain, F/U with [employer] tomorrow or Tuesday.  1. Fracture of right distal radius, closed, initial encounter    I personally performed the services described in this documentation, which was scribed in my presence. The recorded information has been reviewed and is accurate.  Osvaldo Human, M.D.     Michelle Cooper III, MD 01/03/13 [phone]

## 2015-07-15 DIAGNOSIS — I639 Cerebral infarction, unspecified: Secondary | ICD-10-CM

## 2015-07-15 HISTORY — DX: Cerebral infarction, unspecified: I63.9

## 2015-12-08 ENCOUNTER — Encounter (HOSPITAL_BASED_OUTPATIENT_CLINIC_OR_DEPARTMENT_OTHER): Payer: Self-pay | Admitting: Emergency Medicine

## 2015-12-08 ENCOUNTER — Observation Stay (HOSPITAL_BASED_OUTPATIENT_CLINIC_OR_DEPARTMENT_OTHER)
Admission: EM | Admit: 2015-12-08 | Discharge: 2015-12-10 | Disposition: A | Discharge status: Home / Self Care | Payer: BC Managed Care – PPO | Attending: Family Medicine | Admitting: Family Medicine

## 2015-12-08 ENCOUNTER — Emergency Department (HOSPITAL_BASED_OUTPATIENT_CLINIC_OR_DEPARTMENT_OTHER): Payer: BC Managed Care – PPO

## 2015-12-08 DIAGNOSIS — H5500 Unspecified nystagmus: Secondary | ICD-10-CM | POA: Diagnosis not present

## 2015-12-08 DIAGNOSIS — Z794 Long term (current) use of insulin: Secondary | ICD-10-CM | POA: Insufficient documentation

## 2015-12-08 DIAGNOSIS — Z7982 Long term (current) use of aspirin: Secondary | ICD-10-CM | POA: Diagnosis not present

## 2015-12-08 DIAGNOSIS — F17200 Nicotine dependence, unspecified, uncomplicated: Secondary | ICD-10-CM | POA: Diagnosis not present

## 2015-12-08 DIAGNOSIS — E039 Hypothyroidism, unspecified: Secondary | ICD-10-CM | POA: Diagnosis not present

## 2015-12-08 DIAGNOSIS — E1140 Type 2 diabetes mellitus with diabetic neuropathy, unspecified: Secondary | ICD-10-CM | POA: Diagnosis not present

## 2015-12-08 DIAGNOSIS — H9191 Unspecified hearing loss, right ear: Secondary | ICD-10-CM

## 2015-12-08 DIAGNOSIS — E871 Hypo-osmolality and hyponatremia: Secondary | ICD-10-CM | POA: Diagnosis not present

## 2015-12-08 DIAGNOSIS — H8109 Meniere's disease, unspecified ear: Secondary | ICD-10-CM | POA: Diagnosis present

## 2015-12-08 DIAGNOSIS — I10 Essential (primary) hypertension: Secondary | ICD-10-CM | POA: Diagnosis not present

## 2015-12-08 DIAGNOSIS — H8101 Meniere's disease, right ear: Secondary | ICD-10-CM | POA: Diagnosis not present

## 2015-12-08 DIAGNOSIS — R42 Dizziness and giddiness: Secondary | ICD-10-CM

## 2015-12-08 DIAGNOSIS — E1165 Type 2 diabetes mellitus with hyperglycemia: Secondary | ICD-10-CM | POA: Diagnosis not present

## 2015-12-08 DIAGNOSIS — R270 Ataxia, unspecified: Secondary | ICD-10-CM

## 2015-12-08 LAB — CBC WITH DIFFERENTIAL/PLATELET
BASOS PCT: 0 %
Basophils Absolute: 0 10*3/uL (ref 0.0–0.1)
EOS ABS: 0.3 10*3/uL (ref 0.0–0.7)
EOS PCT: 4 %
HCT: 37.7 % (ref 36.0–46.0)
Hemoglobin: 13 g/dL (ref 12.0–15.0)
Lymphocytes Relative: 29 %
Lymphs Abs: 2.5 10*3/uL (ref 0.7–4.0)
MCH: 30.4 pg (ref 26.0–34.0)
MCHC: 34.5 g/dL (ref 30.0–36.0)
MCV: 88.3 fL (ref 78.0–100.0)
MONO ABS: 0.6 10*3/uL (ref 0.1–1.0)
MONOS PCT: 7 %
Neutro Abs: 5.3 10*3/uL (ref 1.7–7.7)
Neutrophils Relative %: 60 %
Platelets: 320 10*3/uL (ref 150–400)
RBC: 4.27 MIL/uL (ref 3.87–5.11)
RDW: 13.3 % (ref 11.5–15.5)
WBC: 8.6 10*3/uL (ref 4.0–10.5)

## 2015-12-08 LAB — COMPREHENSIVE METABOLIC PANEL
ALBUMIN: 4 g/dL (ref 3.5–5.0)
ALT: 32 U/L (ref 14–54)
ANION GAP: 9 (ref 5–15)
AST: 27 U/L (ref 15–41)
Alkaline Phosphatase: 96 U/L (ref 38–126)
BILIRUBIN TOTAL: 0.4 mg/dL (ref 0.3–1.2)
BUN: 16 mg/dL (ref 6–20)
CO2: 25 mmol/L (ref 22–32)
Calcium: 9.3 mg/dL (ref 8.9–10.3)
Chloride: 99 mmol/L — ABNORMAL LOW (ref 101–111)
Creatinine, Ser: 0.88 mg/dL (ref 0.44–1.00)
GFR calc Af Amer: >60 mL/min (ref >60)
GFR calc non Af Amer: >60 mL/min (ref >60)
GLUCOSE: 215 mg/dL — AB (ref 65–99)
POTASSIUM: 3.8 mmol/L (ref 3.5–5.1)
SODIUM: 133 mmol/L — AB (ref 135–145)
TOTAL PROTEIN: 7.4 g/dL (ref 6.5–8.1)

## 2015-12-08 LAB — CBG MONITORING, ED: Glucose-Capillary: 141 mg/dL — ABNORMAL HIGH (ref 65–99)

## 2015-12-08 MED ORDER — ONDANSETRON HCL 4 MG/2ML IJ SOLN
4.0000 mg | Freq: Once | INTRAMUSCULAR | Status: AC
  Administered 2015-12-08: 4 mg via INTRAVENOUS
  Filled 2015-12-08: qty 2

## 2015-12-08 MED ORDER — MECLIZINE HCL 25 MG PO TABS
25.0000 mg | ORAL_TABLET | Freq: Once | ORAL | Status: AC
  Administered 2015-12-08: 25 mg via ORAL
  Filled 2015-12-08: qty 1

## 2015-12-08 NOTE — ED Provider Notes (Signed)
CSN: [id number]     Arrival date & time 12/08/15  1833 History  By signing my name below, I, Georgette Shell, attest that this documentation has been prepared under the direction and in the presence of Malvin Johns, MD. Electronically Signed: Georgette Shell, ED Scribe. 12/08/2015. 7:40 PM.   Chief Complaint  Patient presents with  . Ear Fullness    The history is provided by the patient. No language interpreter was used.    HPI Comments: Gicela Sancen is a 55 y.o. female with a h/x of diabetes who presents to the Emergency Department complaining of sudden onset right-sided tinnitus onset this evening with associated decreased hearing on the right. Pt states that she was working outside today, and is not sure if a bug flew into her ear causing her symptoms. Patient also reports she fell and struck her left forehead on the ground a week ago due to low blood sugar and had a negative head CT at that time. Patient denies trouble with speaking and balance, numbness and tingling in arms and legs, nausea, neck pain, rhinorrhea, and congestion.   Past Medical History  Diagnosis Date  . Diabetes mellitus   . Pancreas disorder   . Neuropathy ([place])   . Hypertension    History reviewed. No pertinent past surgical history. History reviewed. No pertinent family history. Social History  Substance Use Topics  . Smoking status: Current Every Day Smoker  . Smokeless tobacco: None  . Alcohol Use: No   OB History    No data available     Review of Systems  Constitutional: Negative for fever, chills, diaphoresis and fatigue.  HENT: Positive for hearing loss and tinnitus (right). Negative for congestion, ear pain, rhinorrhea and sneezing.   Eyes: Negative.   Respiratory: Negative for cough, chest tightness and shortness of breath.   Cardiovascular: Negative for chest pain and leg swelling.  Gastrointestinal: Negative for nausea, vomiting, abdominal pain, diarrhea and blood in stool.  Genitourinary: Negative for  frequency, hematuria, flank pain and difficulty urinating.  Musculoskeletal: Negative for back pain and arthralgias.  Skin: Negative for rash.  Neurological: Negative for dizziness, speech difficulty, weakness, numbness and headaches.     Allergies  Review of patient's allergies indicates no known allergies.  Home Medications   Prior to Admission medications   Medication Sig Start Date End Date Taking? Authorizing Provider  aspirin 81 MG chewable tablet Chew 81 mg by mouth daily.    Historical Provider, MD  Cholecalciferol (VITAMIN D PO) Take 1 tablet by mouth.    Historical Provider, MD  citalopram (CELEXA) 20 MG tablet Take 20 mg by mouth daily.    Historical Provider, MD  gabapentin (NEURONTIN) 100 MG capsule Take 200 mg by mouth 3 (three) times daily.    Historical Provider, MD  HYDROcodone-acetaminophen (NORCO/VICODIN) 5-325 MG per tablet Take 1 tablet by mouth every 4 (four) hours as needed for pain. 01/02/13   Mylinda Latina, MD  insulin glargine (LANTUS) 100 UNIT/ML injection Inject 20 Units into the skin daily.    Historical Provider, MD  insulin lispro (HUMALOG) 100 UNIT/ML injection Inject 3-5 Units into the skin 3 (three) times daily before meals.    Historical Provider, MD  losartan (COZAAR) 50 MG tablet Take 50 mg by mouth daily.    Historical Provider, MD  meloxicam (MOBIC) 7.5 MG tablet Take 7.5 mg by mouth daily.    Historical Provider, MD  mirabegron ER (MYRBETRIQ) 25 MG TB24 Take 25 mg by mouth daily.  Historical Provider, MD  Multiple Vitamin (MULTIVITAMIN) capsule Take 1 capsule by mouth daily.    Historical Provider, MD  oxyCODONE-acetaminophen (PERCOCET/ROXICET) 5-325 MG per tablet Take 1-2 tablets by mouth every 6 (six) hours as needed for pain. 12/25/12   Calvert Cantor, MD  Pancrelipase, Lip-Prot-Amyl, (CREON) 24000 UNITS CPEP Take 48,000 Units by mouth 3 (three) times daily with meals.    Historical Provider, MD  predniSONE (DELTASONE) 20 MG tablet Take 3 tablets  (60 mg total) by mouth daily. 12/25/12   Calvert Cantor, MD  simvastatin (ZOCOR) 40 MG tablet Take 40 mg by mouth every evening.    Historical Provider, MD   BP 185/107 mmHg  Pulse 70  Temp(Src) 97.8 F (36.6 C) (Oral)  Resp 14  Ht 5\' 9"  (1.753 m)  Wt 150 lb (68.04 kg)  BMI 22.14 kg/m2  SpO2 97% Physical Exam  Constitutional: She is oriented to person, place, and time. She appears well-developed and well-nourished.  HENT:  Head: Normocephalic and atraumatic.  Right Ear: Tympanic membrane normal. No foreign bodies. Tympanic membrane is not erythematous and not bulging. No middle ear effusion.  Left Ear: Tympanic membrane normal.  Eyes: Pupils are equal, round, and reactive to light.  Neck: Normal range of motion. Neck supple.  Cardiovascular: Normal rate, regular rhythm and normal heart sounds.   Pulmonary/Chest: Effort normal and breath sounds normal. No respiratory distress. She has no wheezes. She has no rales. She exhibits no tenderness.  Abdominal: Soft. Bowel sounds are normal. There is no tenderness. There is no rebound and no guarding.  Musculoskeletal: Normal range of motion. She exhibits no edema.  Lymphadenopathy:    She has no cervical adenopathy.  Neurological: She is alert and oriented to person, place, and time. She has normal strength. No sensory deficit. GCS eye subscore is 4. GCS verbal subscore is 5. GCS motor subscore is 6.  Motor 5 out of 5 all extremities, sensation grossly intact to light touch all extremities, finger to nose intact, no pronator drift, no facial drooping, no speech deficits, I movements intact, normal tongue protrusion, normal shoulder shrug, normal sensation in the face to light touch. Patient does have ataxia on ambulation  Skin: Skin is warm and dry. No rash noted.  Psychiatric: She has a normal mood and affect.  Nursing note and vitals reviewed.   ED Course  Procedures (including critical care time) DIAGNOSTIC STUDIES: Oxygen Saturation  is 100% on room air, normal by my interpretation.    COORDINATION OF CARE: 7:31 PM Discussed treatment plan with pt which includes head CT and blood work at bedside and pt agreed to plan.   Labs Review Labs Reviewed  COMPREHENSIVE METABOLIC PANEL - Abnormal; Notable for the following:    Sodium 133 (*)    Chloride 99 (*)    Glucose, Bld 215 (*)    All other components within normal limits  CBG MONITORING, ED - Abnormal; Notable for the following:    Glucose-Capillary 141 (*)    All other components within normal limits  CBC WITH DIFFERENTIAL/PLATELET    Imaging Review Ct Head Wo Contrast  12/08/2015  CLINICAL DATA:  Acute onset of right-sided hearing loss and status. Hyperglycemia. Recent fall with syncope. Initial encounter. EXAM: CT HEAD WITHOUT CONTRAST TECHNIQUE: Contiguous axial images were obtained from the base of the skull through the vertex without intravenous contrast. COMPARISON:  None. FINDINGS: There is no evidence of acute infarction, mass lesion, or intra- or extra-axial hemorrhage on CT. The posterior fossa,  including the cerebellum, brainstem and fourth ventricle, is within normal limits. The third and lateral ventricles, and basal ganglia are unremarkable in appearance. The cerebral hemispheres are symmetric in appearance, with normal gray-white differentiation. No mass effect or midline shift is seen. There is no evidence of fracture; visualized osseous structures are unremarkable in appearance. The visualized portions of the orbits are within normal limits. There is minimal partial opacification of the right mastoid air cells. The paranasal sinuses and left mastoid air cells are well-aerated. No significant soft tissue abnormalities are seen. IMPRESSION: 1. No acute intracranial pathology seen on CT. 2. Minimal partial opacification of the right mastoid air cells. Electronically Signed   By: Garald Balding M.D.   On: 12/08/2015 20:24   I have personally reviewed and  evaluated these images and lab results as part of my medical decision-making.   EKG Interpretation   Date/Time:  Saturday Dec 08 2015 20:55:15 EDT Ventricular Rate:  69 PR Interval:  167 QRS Duration: 98 QT Interval:  418 QTC Calculation: 448 R Axis:   74 Text Interpretation:  Sinus rhythm Probable left atrial enlargement  Anteroseptal infarct, age indeterminate since last tracing no significant  change Confirmed by Leon Montoya  MD, Kolina Kube [id number]) on 12/08/2015 9:02:20 PM      MDM   Final diagnoses:  Acute hearing loss of right ear  Ataxia   Patient presents with acute hearing loss of the right ear with tinnitus. She also has ataxia on exam. She has nystagmus but no other identified neurologic deficits. Her head CT is negative. Her EKG shows a sinus rhythm. I spoke with Dr. Nicole Kindred with neurology. He recommends patient getting an MRI. He feels that if the MRI is normal, patient can be discharged home with outpatient follow-up. Patient will be sent to the emergency department at [facility] to have an MRI. This probably should be done with and without contrast. I spoke with the ED physician, Dr. Billy Fischer who has accepted the patient for transfer.  I personally performed the services described in this documentation, which was scribed in my presence.  The recorded information has been reviewed and considered.     Malvin Johns, MD 12/08/15 2110

## 2015-12-08 NOTE — ED Notes (Signed)
Patient reports that she was out working in the yard when her blood sugar dropped to 57 - patient ate and is feeling better however now she has lost the hearing in her right ear

## 2015-12-09 ENCOUNTER — Emergency Department (HOSPITAL_COMMUNITY): Payer: BC Managed Care – PPO

## 2015-12-09 ENCOUNTER — Encounter (HOSPITAL_COMMUNITY): Payer: Self-pay | Admitting: Family Medicine

## 2015-12-09 DIAGNOSIS — I10 Essential (primary) hypertension: Secondary | ICD-10-CM

## 2015-12-09 DIAGNOSIS — E1140 Type 2 diabetes mellitus with diabetic neuropathy, unspecified: Secondary | ICD-10-CM | POA: Diagnosis not present

## 2015-12-09 DIAGNOSIS — E039 Hypothyroidism, unspecified: Secondary | ICD-10-CM | POA: Diagnosis not present

## 2015-12-09 DIAGNOSIS — Z794 Long term (current) use of insulin: Secondary | ICD-10-CM

## 2015-12-09 DIAGNOSIS — H8109 Meniere's disease, unspecified ear: Secondary | ICD-10-CM | POA: Diagnosis present

## 2015-12-09 DIAGNOSIS — H8101 Meniere's disease, right ear: Secondary | ICD-10-CM

## 2015-12-09 DIAGNOSIS — E1165 Type 2 diabetes mellitus with hyperglycemia: Secondary | ICD-10-CM

## 2015-12-09 LAB — GLUCOSE, CAPILLARY
GLUCOSE-CAPILLARY: 145 mg/dL — AB (ref 65–99)
GLUCOSE-CAPILLARY: 193 mg/dL — AB (ref 65–99)
GLUCOSE-CAPILLARY: 139 mg/dL — AB (ref 65–99)
GLUCOSE-CAPILLARY: 195 mg/dL — AB (ref 65–99)

## 2015-12-09 LAB — CBG MONITORING, ED
GLUCOSE-CAPILLARY: 59 mg/dL — AB (ref 65–99)
Glucose-Capillary: 215 mg/dL — ABNORMAL HIGH (ref 65–99)
Glucose-Capillary: 51 mg/dL — ABNORMAL LOW (ref 65–99)

## 2015-12-09 MED ORDER — INSULIN ASPART 100 UNIT/ML ~~LOC~~ SOLN: 0.0000 [IU] | Freq: Every day | SUBCUTANEOUS | Status: DC

## 2015-12-09 MED ORDER — MECLIZINE HCL 12.5 MG PO TABS
12.5000 mg | ORAL_TABLET | Freq: Three times a day (TID) | ORAL | Status: DC | PRN
  Administered 2015-12-09 – 2015-12-10 (×3): 12.5 mg via ORAL
  Filled 2015-12-09 (×4): qty 1

## 2015-12-09 MED ORDER — MECLIZINE HCL 25 MG PO TABS
25.0000 mg | ORAL_TABLET | Freq: Once | ORAL | Status: AC
  Administered 2015-12-09: 25 mg via ORAL
  Filled 2015-12-09: qty 1

## 2015-12-09 MED ORDER — LORAZEPAM 2 MG/ML IJ SOLN
1.0000 mg | Freq: Once | INTRAMUSCULAR | Status: AC
  Administered 2015-12-09: 1 mg via INTRAVENOUS
  Filled 2015-12-09: qty 1

## 2015-12-09 MED ORDER — ONDANSETRON HCL 4 MG/2ML IJ SOLN
4.0000 mg | Freq: Four times a day (QID) | INTRAMUSCULAR | Status: DC | PRN
  Administered 2015-12-09: 4 mg via INTRAVENOUS
  Filled 2015-12-09: qty 2

## 2015-12-09 MED ORDER — LORAZEPAM 1 MG PO TABS: 1.0000 mg | ORAL_TABLET | Freq: Once | ORAL | Status: DC

## 2015-12-09 MED ORDER — SODIUM CHLORIDE 0.9 % IV BOLUS (SEPSIS)
1000.0000 mL | Freq: Once | INTRAVENOUS | Status: AC
  Administered 2015-12-09: 1000 mL via INTRAVENOUS

## 2015-12-09 MED ORDER — GADOBENATE DIMEGLUMINE 529 MG/ML IV SOLN
15.0000 mL | Freq: Once | INTRAVENOUS | Status: AC | PRN
  Administered 2015-12-09: 14 mL via INTRAVENOUS

## 2015-12-09 MED ORDER — INSULIN DEGLUDEC 100 UNIT/ML ~~LOC~~ SOPN
15.0000 [IU] | PEN_INJECTOR | Freq: Every day | SUBCUTANEOUS | Status: DC
  Administered 2015-12-09: 15 [IU] via SUBCUTANEOUS

## 2015-12-09 MED ORDER — GABAPENTIN 300 MG PO CAPS
300.0000 mg | ORAL_CAPSULE | Freq: Two times a day (BID) | ORAL | Status: DC
  Administered 2015-12-09 – 2015-12-10 (×3): 300 mg via ORAL
  Filled 2015-12-09 (×3): qty 1

## 2015-12-09 MED ORDER — SERTRALINE HCL 100 MG PO TABS
100.0000 mg | ORAL_TABLET | Freq: Every day | ORAL | Status: DC
  Administered 2015-12-09 – 2015-12-10 (×2): 100 mg via ORAL
  Filled 2015-12-09 (×2): qty 1

## 2015-12-09 MED ORDER — ONDANSETRON HCL 4 MG PO TABS: 4.0000 mg | ORAL_TABLET | Freq: Four times a day (QID) | ORAL | Status: DC | PRN

## 2015-12-09 MED ORDER — PANCRELIPASE (LIP-PROT-AMYL) 12000-38000 UNITS PO CPEP
24000.0000 [IU] | ORAL_CAPSULE | Freq: Two times a day (BID) | ORAL | Status: DC
  Administered 2015-12-09 – 2015-12-10 (×2): 24000 [IU] via ORAL
  Filled 2015-12-09 (×2): qty 2

## 2015-12-09 MED ORDER — DEXTROSE 50 % IV SOLN
1.0000 | Freq: Once | INTRAVENOUS | Status: AC
  Administered 2015-12-09: 50 mL via INTRAVENOUS
  Filled 2015-12-09: qty 50

## 2015-12-09 MED ORDER — LOSARTAN POTASSIUM 50 MG PO TABS
50.0000 mg | ORAL_TABLET | Freq: Every day | ORAL | Status: DC
  Administered 2015-12-09 – 2015-12-10 (×2): 50 mg via ORAL
  Filled 2015-12-09 (×2): qty 1

## 2015-12-09 MED ORDER — INSULIN ASPART 100 UNIT/ML ~~LOC~~ SOLN
0.0000 [IU] | Freq: Three times a day (TID) | SUBCUTANEOUS | Status: DC
  Administered 2015-12-09: 1 [IU] via SUBCUTANEOUS
  Administered 2015-12-09: 2 [IU] via SUBCUTANEOUS
  Administered 2015-12-09: 1 [IU] via SUBCUTANEOUS

## 2015-12-09 MED ORDER — THYROID 30 MG PO TABS
90.0000 mg | ORAL_TABLET | Freq: Every day | ORAL | Status: DC
  Administered 2015-12-09 – 2015-12-10 (×2): 90 mg via ORAL
  Filled 2015-12-09 (×2): qty 3

## 2015-12-09 NOTE — ED Notes (Signed)
Orange juice given at this time for cbg of 59.  To recheck.  Admitting provider aware.

## 2015-12-09 NOTE — ED Provider Notes (Signed)
Patient arrives as a transfer from [facility] where she was initially evaluated by Dr. Malvin Johns for right sided tinnitus and hearing changes that started suddenly during the evening. No bleeding from the ear, fever, congestion. No dizziness until arrival at [facility] where she complains of increasing dizziness like surroundings spinning that occurs when she turns her head or tries to lie on her side. The dizziness is accompanied by nausea. She has difficulty walking and feels off balance. Head CT prior to coming here was negative. She was transferred here for MRI-brain w/wo CM to evaluate for stroke.   PE: the patient appears uncomfortable, afraid to open eyes for any length of time. She is alert, oriented, clear speech that is focused; has no weakness of extremities.   IV zofran and PO meclizine provided for symptomatic treatment while waiting for MR. This improved symptoms some but she still has difficulty moving without becoming symptomatic. MR negative for acute conditions. IV Ativan provided. Plan to re-evaluation in 1/2 hour to decide appropriate disposition.  Patient no better with Ativan. Additional Meclizine ordered. Attempt to sit her up in order to ambulate: patient is profoundly symptomatic, unable to tolerate sitting up in the bed. She lives alone and is felt a significant risk of injury at home.  Patient will require admission for symptom control until clinically improved and safe for continued treatment at home.   Charlann Lange, PA-C 12/10/15 0040  Gareth Morgan, MD 12/12/15 1314

## 2015-12-09 NOTE — H&P (Signed)
History and Physical  Patient Name: Michelle Nicholson     [id number]    DOB: [birth year]    DOA: 12/08/2015 PCP: PROVIDER NOT IN SYSTEM   Patient coming from: Home  Chief Complaint: Hearing loss  HPI: Michelle Nicholson is a 55 y.o. female with a past medical history significant for IDDM, HTN, and hypothyroidism who presents with hearing loss and then vertigo.  The patient was in her usual state of health until this afternoon she is gardening and felt she had low blood sugar.  She went inside and was eating something when she noticed abrupt hearing loss, loud tinnitus and aural fullness in the RIGHT ear.  She called a friend and could hear fine in her left ear but barely hear the phone on the right.  She tried to go to Urgent Care but they were closed so she went to the ER where she was ordered an MRI.  ED course: -At [facility] afebrile, hemodynamically stable, saturating well on room air -Na 133, K 3.8, Cr 0.88 (baseline), WBC 8.6K, Hgb 13 -MRI of the brain with and without contrast showed no infarction, mass or white matter changes, no evidence of MS or neuroma -While waiting for her MRI, the patient developed severe vertigo, sensation of spinning, and inability, and TRH was asked to evaluate   One week ago the patient had a hypoglycemic episode in [place] while visiting family, fell and hit her head in a hotel lobby and had a negative head CT.  Head laceration was repaired.  No previous episodes of vertigo.   Review of Systems:  Pt complains of hearing loss, vertigo, tinnitus, aural fullness.   Pt denies any recent upper respiratory infection, coryza, cough, fever, focal weakness, numbness, confusion.  All other systems negative except as just noted or noted in the history of present illness.    Past Medical History  Diagnosis Date  . Diabetes mellitus   . Pancreas disorder   . Neuropathy ([place])   . Hypertension     History reviewed. No pertinent past surgical history.  Social History:  Patient lives alone.  The patient walks unassisted.She smokes.    No Known Allergies  Prior to Admission medications   Medication Sig Start Date End Date Taking? Authorizing Provider  aspirin 81 MG chewable tablet Chew 81 mg by mouth daily.   Yes Historical Provider, MD  atorvastatin (LIPITOR) 20 MG tablet Take 20 mg by mouth daily.   Yes Historical Provider, MD  Cholecalciferol (VITAMIN D PO) Take 5,000 Units by mouth 2 (two) times daily.    Yes Historical Provider, MD  Coenzyme Q10 (COQ10) 100 MG CAPS Take 100 mg by mouth daily.   Yes Historical Provider, MD  DHEA 10 MG TABS Take 15 mg by mouth daily.   Yes Historical Provider, MD  dorzolamide-timolol (COSOPT) 22.3-6.8 MG/ML ophthalmic solution Place 1 drop into both eyes 2 (two) times daily.   Yes Historical Provider, MD  gabapentin (NEURONTIN) 300 MG capsule Take 300 mg by mouth 2 (two) times daily.   Yes Historical Provider, MD  hydroxypropyl methylcellulose / hypromellose (ISOPTO TEARS / GONIOVISC) 2.5 % ophthalmic solution Place 1 drop into both eyes 2 (two) times daily.   Yes Historical Provider, MD  insulin aspart (NOVOLOG FLEXPEN) 100 UNIT/ML FlexPen Inject 4 Units into the skin 3 (three) times daily with meals. Plus sliding scale as needed for blood sugar levels   Yes Historical Provider, MD  Insulin Degludec (TRESIBA FLEXTOUCH [place]) Inject 20 Units into  the skin daily.   Yes Historical Provider, MD  losartan (COZAAR) 50 MG tablet Take 50 mg by mouth daily.   Yes Historical Provider, MD  Melatonin 5 MG TABS Take 5 mg by mouth at bedtime.   Yes Historical Provider, MD  Menaquinone-7 (VITAMIN K2) 100 MCG CAPS Take 150 mg by mouth daily.   Yes Historical Provider, MD  Multiple Vitamin (MULTIVITAMIN) capsule Take 1 capsule by mouth daily.   Yes Historical Provider, MD  Omega-3 Fatty Acids (FISH OIL) 1000 MG CAPS Take 1,000 mg by mouth daily.   Yes Historical Provider, MD  OVER THE COUNTER MEDICATION Take 75 mg by mouth daily. Pregnenolone    Yes Historical Provider, MD  Pancrelipase, Lip-Prot-Amyl, (CREON) 24000 UNITS CPEP Take 24,000 Units by mouth 2 (two) times daily.    Yes Historical Provider, MD  sertraline (ZOLOFT) 100 MG tablet Take 100 mg by mouth daily.   Yes Historical Provider, MD  Thyroid (NATURE-THROID) 97.5 MG TABS Take 1 tablet by mouth daily.   Yes Historical Provider, MD  Turmeric 500 MG CAPS Take 500 mg by mouth daily.   Yes Historical Provider, MD       Physical Exam: BP 141/46 mmHg  Pulse 63  Temp(Src) 97.8 F (36.6 C) (Oral)  Resp 16  Ht 5\' 9"  (1.753 m)  Wt 68.04 kg (150 lb)  BMI 22.14 kg/m2  SpO2 92% General appearance: Well-developed, adult female, awake and in moderate discomfort from vertigo/nausea. Eyes: Anicteric, conjunctiva pink, lids and lashes normal.     ENT: Bilateral TMs are pale, reflective and without effusion.  No nasal deformity, discharge, or epistaxis.  OP moist without lesions. No thyromegaly or masses.   Lymph: No cervical or supraclavicular lymphadenopathy.  Skin: Warm and dry.  No jaundice.  No suspicious rashes or lesions. Cardiac: RRR, nl S1-S2, no murmurs appreciated.   Respiratory: Normal respiratory rate and rhythm.  Expiratory wheezes. Abdomen: Abdomen soft without rigidity.  No TTP. No ascites, distension.   MSK: No deformities or effusions. Neuro: Cranial nerves normal, except hearing is decreased on R.  There is no saccade that I see with HIT.  Nystagmus is mild, horizontal and seems symmetric.  Sensorium intact and responding to questions, attention normal.  Speech is fluent.  Moves all extremities equally and with normal coordination.    Psych: Behavior appropriate.  Affect normal.  No evidence of aural or visual hallucinations or delusions.       Labs on Admission:  I have personally reviewed following labs and imaging studies: CBC:  Recent Labs Lab 12/08/15 1950  WBC 8.6  NEUTROABS 5.3  HGB 13.0  HCT 37.7  MCV 88.3  PLT 99991111   Basic Metabolic  Panel:  Recent Labs Lab 12/08/15 1950  NA 133*  K 3.8  CL 99*  CO2 25  GLUCOSE 215*  BUN 16  CREATININE 0.88  CALCIUM 9.3   GFR: Estimated Creatinine Clearance: 74.6 mL/min (by C-G formula based on Cr of 0.88). Liver Function Tests:  Recent Labs Lab 12/08/15 1950  AST 27  ALT 32  ALKPHOS 96  BILITOT 0.4  PROT 7.4  ALBUMIN 4.0   No results for input(s): LIPASE, AMYLASE in the last 168 hours. No results for input(s): AMMONIA in the last 168 hours. Coagulation Profile: No results for input(s): INR, PROTIME in the last 168 hours. Cardiac Enzymes: No results for input(s): CKTOTAL, CKMB, CKMBINDEX, TROPONINI in the last 168 hours. BNP (last 3 results) No results for input(s): PROBNP in  the last 8760 hours. HbA1C: No results for input(s): HGBA1C in the last 72 hours. CBG:  Recent Labs Lab 12/08/15 1856 12/09/15 0443  GLUCAP 141* 59*   Lipid Profile: No results for input(s): CHOL, HDL, LDLCALC, TRIG, CHOLHDL, LDLDIRECT in the last 72 hours. Thyroid Function Tests: No results for input(s): TSH, T4TOTAL, FREET4, T3FREE, THYROIDAB in the last 72 hours. Anemia Panel: No results for input(s): VITAMINB12, FOLATE, FERRITIN, TIBC, IRON, RETICCTPCT in the last 72 hours. Sepsis Labs: @LABRCNTIP (procalcitonin:4,lacticidven:4) )No results found for this or any previous visit (from the past 240 hour(s)).       Radiological Exams on Admission: Reports reviewed: Ct Head Wo Contrast  12/08/2015  CLINICAL DATA:  Acute onset of right-sided hearing loss and status. Hyperglycemia. Recent fall with syncope. Initial encounter. EXAM: CT HEAD WITHOUT CONTRAST TECHNIQUE: Contiguous axial images were obtained from the base of the skull through the vertex without intravenous contrast. COMPARISON:  None. FINDINGS: There is no evidence of acute infarction, mass lesion, or intra- or extra-axial hemorrhage on CT. The posterior fossa, including the cerebellum, brainstem and fourth ventricle, is  within normal limits. The third and lateral ventricles, and basal ganglia are unremarkable in appearance. The cerebral hemispheres are symmetric in appearance, with normal gray-white differentiation. No mass effect or midline shift is seen. There is no evidence of fracture; visualized osseous structures are unremarkable in appearance. The visualized portions of the orbits are within normal limits. There is minimal partial opacification of the right mastoid air cells. The paranasal sinuses and left mastoid air cells are well-aerated. No significant soft tissue abnormalities are seen. IMPRESSION: 1. No acute intracranial pathology seen on CT. 2. Minimal partial opacification of the right mastoid air cells. Electronically Signed   By: Garald Balding M.D.   On: 12/08/2015 20:24   Mr Jeri Cos [id number] Contrast  12/09/2015  CLINICAL DATA:  Initial evaluation for acute dizziness. EXAM: MRI HEAD WITHOUT AND WITH CONTRAST TECHNIQUE: Multiplanar, multiecho pulse sequences of the brain and surrounding structures were obtained without and with intravenous contrast. CONTRAST:  62mL MULTIHANCE GADOBENATE DIMEGLUMINE 529 MG/ML IV SOLN COMPARISON:  Prior CT from 12/08/2015. FINDINGS: Cerebral volume within normal limits for patient age. Patchy T2/FLAIR hyperintensity within the pons like related to chronic small vessel ischemic disease. No other significant white matter changes. No abnormal foci of restricted diffusion to suggest acute intracranial infarct. Gray-white matter differentiation well maintained. Major intracranial vascular flow voids are preserved. 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 No acute or chronic intracranial hemorrhage. No areas of chronic infarction. No mass lesion, midline shift, or mass effect. No hydrocephalus. No extra-axial fluid collection. No abnormal enhancement. Major dural sinuses are patent. Craniocervical junction normal. Visualized upper cervical spine unremarkable. Pituitary gland within  normal limits. No acute abnormality about the orbits. Mild scattered mucosal thickening within the paranasal sinuses. No air-fluid levels to suggest active sinus infection. Small right mastoid effusion noted. Inner ear structures and internal auditory canals are grossly normal. Bone marrow signal intensity within normal limits. No scalp soft tissue abnormality. IMPRESSION: 1. No acute intracranial process identified. 2. Mild chronic small vessel ischemic disease involving the pons. 3. Small right mastoid effusion. Electronically Signed   By: Jeannine Boga M.D.   On: 12/09/2015 03:18        Assessment/Plan 1. Vertigo:  Probably Meniere's disease.  Patient lives alone and unable to go home at peak of symptoms.  Discussed causes of vertigo and description of Meniere's, likely improving course, usual fluctuations in  hearing, improvement in vertigo, role for vestibular medicines    -Lorazepam 1 mg once now -meclizine 12.5 mg PRN and may increase to 50 mg for symptom relief if needed -Referral to ENT as outpatient    2. IDDM:  -Continue Degludec slightly lower than home dose -Low dose sliding scale  3. HTN:  -Continue losartan  4. Hypothyroidism:  -Continue levothyroxine     DVT prophylaxis: Low risk, expect to be discharged today or tomorrow  Code Status: FULL  Family Communication: None  Disposition Plan: Anticipate observe and symptomatic care today.  If able to sit up today and take medicines by mouth, likely home with friend this afternoon.  ENT referral outpatient. Consults called: None Admission status: OBS, med surg   Medical decision making: Patient seen at 5:26 AM on 12/09/2015.  The patient was discussed with Charlann Lange, PA-C. What exists of the patient's chart was reviewed in depth.  Clinical condition: stable.        Edwin Dada [facility] Pager [phone]

## 2015-12-09 NOTE — ED Notes (Signed)
Attempted report at this time.  Nurse to call back when available. 

## 2015-12-09 NOTE — Progress Notes (Addendum)
Seen examined Re-start home meds Vestib rehab ordered-no therapist on staff given long weekend to see today Someone will se ein am If tol PO's and not dizzy [still dizzy up to bedside commode this afternoon] could d/c as early as tomorrow Unlikely needs ENT input unless not resolved with Vestib rehab  Verneita Griffes, MD [facility] [phone]

## 2015-12-09 NOTE — ED Notes (Signed)
Back from MRI.

## 2015-12-09 NOTE — ED Notes (Signed)
Dr. Loleta Books at the bedside.

## 2015-12-09 NOTE — ED Notes (Signed)
Taken to MRI 

## 2015-12-10 DIAGNOSIS — H8101 Meniere's disease, right ear: Secondary | ICD-10-CM | POA: Diagnosis not present

## 2015-12-10 LAB — GLUCOSE, CAPILLARY
GLUCOSE-CAPILLARY: 59 mg/dL — AB (ref 65–99)
Glucose-Capillary: 129 mg/dL — ABNORMAL HIGH (ref 65–99)

## 2015-12-10 MED ORDER — MECLIZINE HCL 12.5 MG PO TABS: 12.5000 mg | ORAL_TABLET | Freq: Three times a day (TID) | ORAL | Status: DC | PRN

## 2015-12-10 MED ORDER — INSULIN DEGLUDEC 100 UNIT/ML ~~LOC~~ SOPN
10.0000 [IU] | PEN_INJECTOR | Freq: Every day | SUBCUTANEOUS | Status: DC
  Administered 2015-12-10: 10 [IU] via SUBCUTANEOUS

## 2015-12-10 MED ORDER — INSULIN DEGLUDEC 100 UNIT/ML ~~LOC~~ SOPN: 7.0000 [IU] | PEN_INJECTOR | Freq: Every day | SUBCUTANEOUS | Status: AC

## 2015-12-10 NOTE — Care Management Note (Signed)
Case Management Note  Patient Details  Name: Michelle Nicholson MRN: [id number] Date of Birth: 01/26/1961  Subjective/Objective:                 Admitted with meniere syndrome   Action/Plan: Spoke with patient about discharge plan. Gave choice for home health, she selected [facility]. Contacted Donna at [place] and set up vestibular HHPT, [place] and aide visits. Patient stated taht she will have family or friends staying with her to assist for next few days Patient stated that she has a rolling walker and a 3N1.No equipment recommended by therapy.Gave her information about a home alert system per her request.   Expected Discharge Date:                  Expected Discharge Plan:  [place]  In-House Referral:  NA  Discharge planning Services  CM Consult  Post Acute Care Choice:  Home Health Choice offered to:  Patient  DME Arranged:  N/A DME Agency:     HH Arranged:  PT, OT, Nurse's Aide [place] Agency:  [facility]  Status of Service:  Completed, signed off  Medicare Important Message Given:    Date Medicare IM Given:    Medicare IM give by:    Date Additional Medicare IM Given:    Additional Medicare Important Message give by:     If discussed at [place] of Stay Meetings, dates discussed:    Additional Comments:  Amairani, Miccio, RN 12/10/2015, 11:35 AM

## 2015-12-10 NOTE — Discharge Summary (Signed)
Physician Discharge Summary  Michelle Nicholson [id number] DOB: Oct 09, 1960 DOA: 12/08/2015  PCP: PROVIDER NOT IN SYSTEM  Admit date: 12/08/2015 Discharge date: 12/10/2015  Time spent: 40 minutes  Recommendations for Outpatient Follow-up:  1. The patient needs outpatient vestibular rehabilitation and a heart prescription was given for her to present herself to outpatient therapy to obtain this 2. Recommend cut back of Lantus and low-dose insulin at night especially 3. Hyponatremia needs to be workup as an outpatient may benefit from urinary sodium and urinary osmolality-could be an effective DHEA affecting HPA-pituitary axis and would defer to wellness physician regarding this 4. Can continue other blood pressure medications for now 5. Stable for discharge but needs close monitoring at home and family is willing to do the same.  Discharge Diagnoses:  Principal Problem:   Meniere syndrome Active Problems:   Controlled type 2 diabetes mellitus with diabetic neuropathy, with long-term current use of insulin [facility])   Essential hypertension   Discharge Condition: Improved  Diet recommendation: Regular diabetic diet  Filed Weights   12/08/15 1845 12/09/15 0647  Weight: 68.04 kg (150 lb) 67.7 kg (149 lb 4 oz)    History of present illness:  55 year old female admitted from [facility] emergency room 12/09/2015 with low blood sugar, dizziness and found to have probable Mnire's disease Patient was given meclizine as well as lorazepam with no benefit and was observed overnight 528-12/10/2015 Vestibular rehabilitation was done on the patient and patient experienced good improvement of symptoms and was able to ambulate unsteadily but much better than prior I had long and extensive discussions with both patient and the family regarding her low blood sugar and made adjustments to her insulin and gave her prescription for vestibular rehabilitation follow-up in the outpatient setting I would recommend  that her wellness physician who prescribes her vitamin K2, DHEA, turmeric and other supplements discuss these medications with her primary care physician to ensure that no interactions occur and that her hyponatremia is also ruled out as being not secondary to one of the supplements She is stable for discharge    Discharge Exam: Filed Vitals:   12/09/15 2005 12/10/15 0407  BP: 161/66 119/41  Pulse: 63 58  Temp: 98.1 F (36.7 C) 97.9 F (36.6 C)  Resp: 18 16    General: EOMI NCAT Cardiovascular: S1-S2 no murmur rub or gallop Respiratory: Clinically clear no added sound Neuro intact to pwer  Discharge Instructions   Discharge Instructions    Diet - low sodium heart healthy    Complete by:  As directed      Diet - low sodium heart healthy    Complete by:  As directed      Discharge instructions    Complete by:  As directed   I would recommend that you cut back her nighttime dose of short acting insulin to maybe 2 units for the next couple of days and I'll decreased urine long-acting insulin from 20-7 units I would recommend that her primary physician as well as your wellness physician coordinate your care as you are on numerous medications that while good for you, may have interaction with allopathic medicines I would not workup your low sodium further unless there is care coordination between your integrative physician and your regular physician I recommend that you get outpatient vestibular rehabilitation and I do think that as long as you feel safe walking around slowly for the next week that you can safely go to the outpatient facility to get this rehabilitation I would  strongly recommend that you visit your primary physician prior to any long car trips to visit her family I recommend that you get a basic metabolic panel as well as further lab work as determined by her primary physician at office visit I would recommend also that you continue the meclizine for short period of  time until your symptoms subside of course you can always feel free to ask for referral to an ENT physician for expert management of the same from your primary care physician     Increase activity slowly    Complete by:  As directed      Increase activity slowly    Complete by:  As directed           Current Discharge Medication List    START taking these medications   Details  meclizine (ANTIVERT) 12.5 MG tablet Take 1 tablet (12.5 mg total) by mouth 3 (three) times daily as needed for dizziness. Qty: 30 tablet, Refills: 0      CONTINUE these medications which have CHANGED   Details  Insulin Degludec (TRESIBA FLEXTOUCH) 100 UNIT/ML SOPN Inject 7 Units into the skin daily.      CONTINUE these medications which have NOT CHANGED   Details  aspirin 81 MG chewable tablet Chew 81 mg by mouth daily.    atorvastatin (LIPITOR) 20 MG tablet Take 20 mg by mouth daily.    Cholecalciferol (VITAMIN D PO) Take 5,000 Units by mouth 2 (two) times daily.     Coenzyme Q10 (COQ10) 100 MG CAPS Take 100 mg by mouth daily.    DHEA 10 MG TABS Take 15 mg by mouth daily.    dorzolamide-timolol (COSOPT) 22.3-6.8 MG/ML ophthalmic solution Place 1 drop into both eyes 2 (two) times daily.    gabapentin (NEURONTIN) 300 MG capsule Take 300 mg by mouth 2 (two) times daily.    hydroxypropyl methylcellulose / hypromellose (ISOPTO TEARS / GONIOVISC) 2.5 % ophthalmic solution Place 1 drop into both eyes 2 (two) times daily.    insulin aspart (NOVOLOG FLEXPEN) 100 UNIT/ML FlexPen Inject 4 Units into the skin 3 (three) times daily with meals. Plus sliding scale as needed for blood sugar levels    losartan (COZAAR) 50 MG tablet Take 50 mg by mouth daily.    Melatonin 5 MG TABS Take 5 mg by mouth at bedtime.    Menaquinone-7 (VITAMIN K2) 100 MCG CAPS Take 150 mg by mouth daily.    Multiple Vitamin (MULTIVITAMIN) capsule Take 1 capsule by mouth daily.    Omega-3 Fatty Acids (FISH OIL) 1000 MG CAPS Take  1,000 mg by mouth daily.    OVER THE COUNTER MEDICATION Take 75 mg by mouth daily. Pregnenolone    Pancrelipase, Lip-Prot-Amyl, (CREON) 24000 UNITS CPEP Take 24,000 Units by mouth 2 (two) times daily.     sertraline (ZOLOFT) 100 MG tablet Take 100 mg by mouth daily.    Thyroid (NATURE-THROID) 97.5 MG TABS Take 1 tablet by mouth daily.    Turmeric 500 MG CAPS Take 500 mg by mouth daily.       No Known Allergies    The results of significant diagnostics from this hospitalization (including imaging, microbiology, ancillary and laboratory) are listed below for reference.    Significant Diagnostic Studies: Ct Head Wo Contrast  12/08/2015  CLINICAL DATA:  Acute onset of right-sided hearing loss and status. Hyperglycemia. Recent fall with syncope. Initial encounter. EXAM: CT HEAD WITHOUT CONTRAST TECHNIQUE: Contiguous axial images were obtained from the base  of the skull through the vertex without intravenous contrast. COMPARISON:  None. FINDINGS: There is no evidence of acute infarction, mass lesion, or intra- or extra-axial hemorrhage on CT. The posterior fossa, including the cerebellum, brainstem and fourth ventricle, is within normal limits. The third and lateral ventricles, and basal ganglia are unremarkable in appearance. The cerebral hemispheres are symmetric in appearance, with normal gray-white differentiation. No mass effect or midline shift is seen. There is no evidence of fracture; visualized osseous structures are unremarkable in appearance. The visualized portions of the orbits are within normal limits. There is minimal partial opacification of the right mastoid air cells. The paranasal sinuses and left mastoid air cells are well-aerated. No significant soft tissue abnormalities are seen. IMPRESSION: 1. No acute intracranial pathology seen on CT. 2. Minimal partial opacification of the right mastoid air cells. Electronically Signed   By: Garald Balding M.D.   On: 12/08/2015 20:24   Mr  Jeri Cos [id number] Contrast  12/09/2015  CLINICAL DATA:  Initial evaluation for acute dizziness. EXAM: MRI HEAD WITHOUT AND WITH CONTRAST TECHNIQUE: Multiplanar, multiecho pulse sequences of the brain and surrounding structures were obtained without and with intravenous contrast. CONTRAST:  85mL MULTIHANCE GADOBENATE DIMEGLUMINE 529 MG/ML IV SOLN COMPARISON:  Prior CT from 12/08/2015. FINDINGS: Cerebral volume within normal limits for patient age. Patchy T2/FLAIR hyperintensity within the pons like related to chronic small vessel ischemic disease. No other significant white matter changes. No abnormal foci of restricted diffusion to suggest acute intracranial infarct. Gray-white matter differentiation well maintained. Major intracranial vascular flow voids are preserved. 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 No acute or chronic intracranial hemorrhage. No areas of chronic infarction. No mass lesion, midline shift, or mass effect. No hydrocephalus. No extra-axial fluid collection. No abnormal enhancement. Major dural sinuses are patent. Craniocervical junction normal. Visualized upper cervical spine unremarkable. Pituitary gland within normal limits. No acute abnormality about the orbits. Mild scattered mucosal thickening within the paranasal sinuses. No air-fluid levels to suggest active sinus infection. Small right mastoid effusion noted. Inner ear structures and internal auditory canals are grossly normal. Bone marrow signal intensity within normal limits. No scalp soft tissue abnormality. IMPRESSION: 1. No acute intracranial process identified. 2. Mild chronic small vessel ischemic disease involving the pons. 3. Small right mastoid effusion. Electronically Signed   By: Jeannine Boga M.D.   On: 12/09/2015 03:18    Microbiology: No results found for this or any previous visit (from the past 240 hour(s)).   Labs: Basic Metabolic Panel:  Recent Labs Lab 12/08/15 1950  NA 133*  K 3.8  CL 99*   CO2 25  GLUCOSE 215*  BUN 16  CREATININE 0.88  CALCIUM 9.3   Liver Function Tests:  Recent Labs Lab 12/08/15 1950  AST 27  ALT 32  ALKPHOS 96  BILITOT 0.4  PROT 7.4  ALBUMIN 4.0   No results for input(s): LIPASE, AMYLASE in the last 168 hours. No results for input(s): AMMONIA in the last 168 hours. CBC:  Recent Labs Lab 12/08/15 1950  WBC 8.6  NEUTROABS 5.3  HGB 13.0  HCT 37.7  MCV 88.3  PLT 320   Cardiac Enzymes: No results for input(s): CKTOTAL, CKMB, CKMBINDEX, TROPONINI in the last 168 hours. BNP: BNP (last 3 results) No results for input(s): BNP in the last 8760 hours.  ProBNP (last 3 results) No results for input(s): PROBNP in the last 8760 hours.  CBG:  Recent Labs Lab 12/09/15 1210 12/09/15 1711 12/09/15 2000 12/10/15  0755 12/10/15 0906  GLUCAP 193* 139* 195* 59* 129*       Signed:  Nita Sells MD   [facility] 12/10/2015, 10:44 AM

## 2015-12-10 NOTE — Progress Notes (Signed)
Patient was discharged home with home health  by MD order; discharged instructions  review and give to patient with care notes; IV DIC; skin intact; patient will be escorted to the car by a volunteer via wheelchair.

## 2015-12-10 NOTE — Evaluation (Addendum)
Physical Therapy Evaluation and D/C Patient Details Name: Michelle Nicholson MRN: [id number] DOB: [birth year] Today's Date: 12/10/2015   History of Present Illness  Michelle Nicholson is a 55 y.o. female with a past medical history significant for IDDM, HTN, and hypothyroidism who presents with hearing loss and then vertigo.  The patient was in her usual state of health until this afternoon she is gardening and felt she had low blood sugar. She went inside and was eating something when she noticed abrupt hearing loss, loud tinnitus and aural fullness in the RIGHT ear. She called a friend and could hear fine in her left ear but barely hear the phone on the right. She tried to go to Urgent Care but they were closed so she went to the ER where she was ordered an MRI.  Clinical Impression  Pt admitted with above diagnosis. Pt currently with functional limitations due to the deficits listed below (see PT Problem List). Pt treated via canalith repositioning maneuver with decr dizziness reported after treatment.  Pt was able to ambulate with RW in controlled environment with good overall safety.  Sister states she can stay with pt a few days.  Will need HH f/u as below. Spent incr time updating family members and educating them re: pts mobility and treatment for BPPV and exercises. Will sign off as pt going home today.       Follow Up Recommendations Home health PT;Supervision/Assistance - 24 hour (for vestibular rehab, HHOT, [place])    Equipment Recommendations  None recommended by PT life alert - CM gave family information    Recommendations for Other Services       Precautions / Restrictions Precautions Precautions: Fall Restrictions Weight Bearing Restrictions: No      Mobility  Bed Mobility Overal bed mobility: Independent             General bed mobility comments: Dizziness with transitions.    Transfers Overall transfer level: Needs assistance Equipment used: Rolling walker (2  wheeled) Transfers: Sit to/from Stand Sit to Stand: Supervision         General transfer comment: Safe transfers with RW. Also used "A" target for compensation.   Ambulation/Gait Ambulation/Gait assistance: Min guard;Supervision Ambulation Distance (Feet): 150 Feet Assistive device: Rolling walker (2 wheeled) Gait Pattern/deviations: Step-through pattern;Decreased stride length   Gait velocity interpretation: Below normal speed for age/gender General Gait Details: Pt able to ambulate with RW with good safety overall.  Can function with RW in controlled environment.  Did use compensatory strategies to move around moving eyes then head then body.   Pt also used "A" Target.    Stairs            Wheelchair Mobility    Modified Rankin (Stroke Patients Only)       Balance Overall balance assessment: Needs assistance;History of Falls Sitting-balance support: No upper extremity supported;Feet supported Sitting balance-Leahy Scale: Fair     Standing balance support: Bilateral upper extremity supported;During functional activity Standing balance-Leahy Scale: Poor Standing balance comment: RElies on RW for balance.               High level balance activites: Direction changes;Turns;Sudden stops;Head turns High Level Balance Comments: min guard assist without LOB with RW             Pertinent Vitals/Pain Pain Assessment: No/denies pain  VSS    Home Living Family/patient expects to be discharged to:: Private residence Living Arrangements: Alone Available Help at Discharge: Family;Available 24 hours/day Type of  Home: House Home Access: Stairs to enter Entrance Stairs-Rails: Left Entrance Stairs-Number of Steps: 4 Home Layout: One level Home Equipment: Bedside commode;Walker - 2 wheels (to borrow rollator, shower chair)      Prior Function Level of Independence: Independent               Hand Dominance   Dominant Hand: Right    Extremity/Trunk  Assessment   Upper Extremity Assessment: Defer to OT evaluation           Lower Extremity Assessment: Overall WFL for tasks assessed      Cervical / Trunk Assessment: Normal  Communication   Communication: No difficulties  Cognition Arousal/Alertness: Awake/alert Behavior During Therapy: Flat affect Overall Cognitive Status: Within Functional Limits for tasks assessed                      General Comments General comments (skin integrity, edema, etc.): Pt with nystagmus that was moving several directions.  Pt had meclizine earlier.  Appeared to have symptoms of right BPPV therefore treated with canalith repositioning.  Pt with decr symptoms after treatment per pt report with dizzy 7/10 down to 3/10 after treatment.     Exercises Other Exercises Other Exercises: initiated x1 exercises. Gave handout for this and MEneires disease handout.       Assessment/Plan    PT Assessment Patient needs continued PT services;All further PT needs can be met in the next venue of care  PT Diagnosis Generalized weakness (dizziness)   PT Problem List Decreased activity tolerance;Decreased balance;Decreased mobility;Decreased knowledge of use of DME;Decreased safety awareness;Decreased knowledge of precautions (dizziness)  PT Treatment Interventions DME instruction;Gait training;Functional mobility training;Therapeutic activities;Therapeutic exercise;Balance training;Patient/family education;Stair training   PT Goals (Current goals can be found in the Care Plan section) Acute Rehab PT Goals Patient Stated Goal: to go home PT Goal Formulation: All assessment and education complete, DC therapy    Frequency     Barriers to discharge Decreased caregiver support      Co-evaluation               End of Session Equipment Utilized During Treatment: Gait belt Activity Tolerance: Patient limited by fatigue (limited by dizziness) Patient left: in bed;with call bell/phone within  reach;with bed alarm set;with family/visitor present Nurse Communication: Mobility status    Functional Assessment Tool Used: clinical judgment Functional Limitation: Mobility: Walking and moving around Mobility: Walking and Moving Around Current Status ([id number]): At least 1 percent but less than 20 percent impaired, limited or restricted Mobility: Walking and Moving Around Goal Status [phone]): At least 1 percent but less than 20 percent impaired, limited or restricted Mobility: Walking and Moving Around Discharge Status [phone]): At least 1 percent but less than 20 percent impaired, limited or restricted    Time: 0905-1009 PT Time Calculation (min) (ACUTE ONLY): 64 min   Charges:   PT Evaluation $PT Eval High Complexity: 1 Procedure PT Treatments $Gait Training: 8-22 mins $Self Care/Home Management: 8-22 $Canalith Rep Proc: 8-22 mins   PT G Codes:   PT G-Codes **NOT FOR INPATIENT CLASS** Functional Assessment Tool Used: clinical judgment Functional Limitation: Mobility: Walking and moving around Mobility: Walking and Moving Around Current Status ([id number]): At least 1 percent but less than 20 percent impaired, limited or restricted Mobility: Walking and Moving Around Goal Status [phone]): At least 1 percent but less than 20 percent impaired, limited or restricted Mobility: Walking and Moving Around Discharge Status [phone]): At least 1 percent but less  than 20 percent impaired, limited or restricted    Denice Paradise 12/10/2015, 11:02 AM Dontarius Sheley,PT Acute Rehabilitation [phone] (pager)

## 2016-04-05 ENCOUNTER — Encounter (HOSPITAL_BASED_OUTPATIENT_CLINIC_OR_DEPARTMENT_OTHER): Payer: Self-pay | Admitting: Emergency Medicine

## 2016-04-05 ENCOUNTER — Emergency Department (HOSPITAL_BASED_OUTPATIENT_CLINIC_OR_DEPARTMENT_OTHER)
Admission: EM | Admit: 2016-04-05 | Discharge: 2016-04-06 | Disposition: A | Discharge status: Home / Self Care | Payer: BC Managed Care – PPO | Attending: Emergency Medicine | Admitting: Emergency Medicine

## 2016-04-05 DIAGNOSIS — Y999 Unspecified external cause status: Secondary | ICD-10-CM | POA: Insufficient documentation

## 2016-04-05 DIAGNOSIS — Z794 Long term (current) use of insulin: Secondary | ICD-10-CM | POA: Insufficient documentation

## 2016-04-05 DIAGNOSIS — E119 Type 2 diabetes mellitus without complications: Secondary | ICD-10-CM | POA: Diagnosis not present

## 2016-04-05 DIAGNOSIS — I10 Essential (primary) hypertension: Secondary | ICD-10-CM | POA: Insufficient documentation

## 2016-04-05 DIAGNOSIS — Z7982 Long term (current) use of aspirin: Secondary | ICD-10-CM | POA: Diagnosis not present

## 2016-04-05 DIAGNOSIS — S0990XA Unspecified injury of head, initial encounter: Secondary | ICD-10-CM | POA: Insufficient documentation

## 2016-04-05 DIAGNOSIS — Z79899 Other long term (current) drug therapy: Secondary | ICD-10-CM | POA: Insufficient documentation

## 2016-04-05 DIAGNOSIS — Y929 Unspecified place or not applicable: Secondary | ICD-10-CM | POA: Insufficient documentation

## 2016-04-05 DIAGNOSIS — W11XXXA Fall on and from ladder, initial encounter: Secondary | ICD-10-CM | POA: Insufficient documentation

## 2016-04-05 DIAGNOSIS — Y939 Activity, unspecified: Secondary | ICD-10-CM | POA: Insufficient documentation

## 2016-04-05 DIAGNOSIS — F17200 Nicotine dependence, unspecified, uncomplicated: Secondary | ICD-10-CM | POA: Insufficient documentation

## 2016-04-05 MED ORDER — IBUPROFEN 200 MG PO TABS
600.0000 mg | ORAL_TABLET | Freq: Once | ORAL | Status: AC
  Administered 2016-04-06: 600 mg via ORAL
  Filled 2016-04-05: qty 1

## 2016-04-05 NOTE — ED Provider Notes (Signed)
[facility] DEPT [facility] Provider Note   CSN: [id number] Arrival date & time: 04/05/16  2130  By signing my name below, I, Soijett Blue, attest that this documentation has been prepared under the direction and in the presence of Carlina Derks, PA-C Electronically Signed: Soijett Blue, ED Scribe. 04/05/16. 11:59 PM.   History   Chief Complaint Chief Complaint  Patient presents with  . Head Injury    HPI Michelle Nicholson is a 55 y.o. female with a PMHx of DM, HTN, who presents to the Emergency Department complaining of head injury occurring 5:30 PM today. Pt notes that she was carrying an extension ladder with clothes on it, when it slipped and fell onto her head. Pt states that she felt dazed following the incident. Complains of pain to the top of her head. Pt reports that she takes daily ASA. Pt is having associated symptoms of neck pain x "jammed" sensation and HA. Pain is moderate, throbbing, nonradiating. She notes that she has tried tylenol with her last dose being at 6:30 PM with no relief of her symptoms. She denies LOC, neuro deficits, nausea/vomiting, back pain, and any other symptoms. Pt PCP is Dr. Fredric Mare.   Note: Patient states that she had a stroke affecting her balance a few months ago. This continues to affect her balance on a daily basis.  The history is provided by the patient. No language interpreter was used.    Past Medical History:  Diagnosis Date  . Diabetes mellitus   . Hypertension   . Neuropathy ([place])   . Pancreas disorder     Patient Active Problem List   Diagnosis Date Noted  . Controlled type 2 diabetes mellitus with diabetic neuropathy, with long-term current use of insulin ([place]) 12/09/2015  . Essential hypertension 12/09/2015  . Meniere syndrome 12/09/2015    History reviewed. No pertinent surgical history.  OB History    No data available       Home Medications    Prior to Admission medications   Medication Sig Start Date End Date Taking?  Authorizing Provider  aspirin 81 MG chewable tablet Chew 81 mg by mouth daily.    Historical Provider, MD  atorvastatin (LIPITOR) 20 MG tablet Take 20 mg by mouth daily.    Historical Provider, MD  Cholecalciferol (VITAMIN D PO) Take 5,000 Units by mouth 2 (two) times daily.     Historical Provider, MD  Coenzyme Q10 (COQ10) 100 MG CAPS Take 100 mg by mouth daily.    Historical Provider, MD  DHEA 10 MG TABS Take 15 mg by mouth daily.    Historical Provider, MD  dorzolamide-timolol (COSOPT) 22.3-6.8 MG/ML ophthalmic solution Place 1 drop into both eyes 2 (two) times daily.    Historical Provider, MD  gabapentin (NEURONTIN) 300 MG capsule Take 300 mg by mouth 2 (two) times daily.    Historical Provider, MD  HYDROcodone-acetaminophen (NORCO/VICODIN) 5-325 MG tablet Take 1 tablet by mouth every 6 (six) hours as needed for severe pain. 04/06/16   Javarie Crisp C Tanna Loeffler, PA-C  hydroxypropyl methylcellulose / hypromellose (ISOPTO TEARS / GONIOVISC) 2.5 % ophthalmic solution Place 1 drop into both eyes 2 (two) times daily.    Historical Provider, MD  insulin aspart (NOVOLOG FLEXPEN) 100 UNIT/ML FlexPen Inject 4 Units into the skin 3 (three) times daily with meals. Plus sliding scale as needed for blood sugar levels    Historical Provider, MD  Insulin Degludec (TRESIBA FLEXTOUCH) 100 UNIT/ML SOPN Inject 7 Units into the skin daily. 12/10/15  Nita Sells, MD  losartan (COZAAR) 50 MG tablet Take 50 mg by mouth daily.    Historical Provider, MD  meclizine (ANTIVERT) 12.5 MG tablet Take 1 tablet (12.5 mg total) by mouth 3 (three) times daily as needed for dizziness. 12/10/15   Nita Sells, MD  Melatonin 5 MG TABS Take 5 mg by mouth at bedtime.    Historical Provider, MD  Menaquinone-7 (VITAMIN K2) 100 MCG CAPS Take 150 mg by mouth daily.    Historical Provider, MD  methocarbamol (ROBAXIN) 500 MG tablet Take 1 tablet (500 mg total) by mouth 2 (two) times daily. 04/06/16   Lacey Wallman C Willodean Leven, PA-C  metoCLOPramide  (REGLAN) 10 MG tablet Take 1 tablet (10 mg total) by mouth every 6 (six) hours. 04/06/16   Alys Dulak C Jupiter Kabir, PA-C  Multiple Vitamin (MULTIVITAMIN) capsule Take 1 capsule by mouth daily.    Historical Provider, MD  naproxen (NAPROSYN) 500 MG tablet Take 1 tablet (500 mg total) by mouth 2 (two) times daily. 04/06/16   Reginold Beale C Srinidhi Landers, PA-C  Omega-3 Fatty Acids (FISH OIL) 1000 MG CAPS Take 1,000 mg by mouth daily.    Historical Provider, MD  OVER THE COUNTER MEDICATION Take 75 mg by mouth daily. Pregnenolone    Historical Provider, MD  Pancrelipase, Lip-Prot-Amyl, (CREON) 24000 UNITS CPEP Take 24,000 Units by mouth 2 (two) times daily.     Historical Provider, MD  sertraline (ZOLOFT) 100 MG tablet Take 100 mg by mouth daily.    Historical Provider, MD  Thyroid (NATURE-THROID) 97.5 MG TABS Take 1 tablet by mouth daily.    Historical Provider, MD  Turmeric 500 MG CAPS Take 500 mg by mouth daily.    Historical Provider, MD    Family History History reviewed. No pertinent family history.  Social History Social History  Substance Use Topics  . Smoking status: Current Every Day Smoker  . Smokeless tobacco: Not on file  . Alcohol use No     Allergies   Review of patient's allergies indicates no known allergies.   Review of Systems Review of Systems  Gastrointestinal: Negative for nausea and vomiting.  Musculoskeletal: Positive for neck pain. Negative for back pain.  Neurological: Positive for headaches. Negative for dizziness, syncope, weakness, light-headedness and numbness.       No tingling  All other systems reviewed and are negative.    Physical Exam Updated Vital Signs BP 160/71 (BP Location: Right Arm)   Pulse (!) 54   Temp 97.5 F (36.4 C) (Oral)   Resp 18   Ht 5\' 10"  (1.778 m)   Wt 150 lb (68 kg)   SpO2 93%   BMI 21.52 kg/m   Physical Exam  Constitutional: She is oriented to person, place, and time. She appears well-developed and well-nourished. No distress.  HENT:  Head:  Normocephalic and atraumatic.  Tenderness to central parietal area with no swelling, wounds, instability, or crepitus.    Eyes: Conjunctivae and EOM are normal. Pupils are equal, round, and reactive to light.  Neck: Normal range of motion. Neck supple.  Full ROM both passive and active against resistance. No crepitus, step-off, or deformity.   Cardiovascular: Normal rate, regular rhythm, normal heart sounds and intact distal pulses.   Pulmonary/Chest: Effort normal and breath sounds normal. No respiratory distress.  Abdominal: Soft. She exhibits no distension. There is no tenderness. There is no guarding.  Musculoskeletal: Normal range of motion. She exhibits no edema or tenderness.  Full ROM in all extremities and spine. No  midline spinal tenderness.  Lymphadenopathy:    She has no cervical adenopathy.  Neurological: She is alert and oriented to person, place, and time. She has normal strength. No cranial nerve deficit or sensory deficit.  No sensory deficits. Strength 5/5 in all extremities. No gait disturbance beyond what the patient states is normal for her. Coordination intact. Cranial nerves III-XII grossly intact. No facial droop.   Skin: Skin is warm and dry. She is not diaphoretic.  Psychiatric: She has a normal mood and affect. Her behavior is normal.  Nursing note and vitals reviewed.    ED Treatments / Results  DIAGNOSTIC STUDIES: Oxygen Saturation is 93% on RA, low by my interpretation.    COORDINATION OF CARE: 11:55 PM Discussed treatment plan with pt at bedside which includes muscle relaxer Rx, anti-inflammatory Rx, and pt agreed to plan.   Labs (all labs ordered are listed, but only abnormal results are displayed) Labs Reviewed - No data to display  EKG  EKG Interpretation None       Radiology No results found.  Procedures Procedures (including critical care time)  Medications Ordered in ED Medications  ibuprofen (ADVIL,MOTRIN) tablet 600 mg (600 mg Oral  Given 04/06/16 0041)  metoCLOPramide (REGLAN) tablet 10 mg (10 mg Oral Given 04/06/16 0044)  diphenhydrAMINE (BENADRYL) capsule 50 mg (50 mg Oral Given 04/06/16 0042)     Initial Impression / Assessment and Plan / ED Course  I have reviewed the triage vital signs and the nursing notes.  Pertinent labs & imaging results that were available during my care of the patient were reviewed by me and considered in my medical decision making (see chart for details).  Clinical Course     Findings and plan of care discussed with Everlene Balls, MD. Dr. Claudine Mouton personally evaluated and examined this patient.  Patient symptoms consistent with concussion. No vomiting. No focal neurological deficits on physical exam.  Pt observed in the ED and ongoing observation at home was discussed. CT is not indicated at this time based on the [place] head/C-spine CT rule. Discussed symptoms of post concussive syndrome and reasons to return to the emergency department including any new severe headaches, disequilibrium, vomiting, double vision, extremity weakness, difficulty ambulating, or any other concerning symptoms. Patient was given strict return precautions. Patient ambulated without assistance and was able to pass an oral fluid challenge prior to discharge. Patient has a friend at the bedside who was given instructions for observation of the patient at home. Patient and patient's friend voice understanding of all instructions and are comfortable with discharge.  Vitals:   04/05/16 2140 04/05/16 2351  BP: 179/65 160/71  Pulse: 60 (!) 54  Resp: 20 18  Temp: 97.7 F (36.5 C) 97.5 F (36.4 C)  TempSrc:  Oral  SpO2: 95% 93%  Weight: 68 kg   Height: 5\' 10"  (1.778 m)      Final Clinical Impressions(s) / ED Diagnoses   Final diagnoses:  Head injury, initial encounter    New Prescriptions New Prescriptions   HYDROCODONE-ACETAMINOPHEN (NORCO/VICODIN) 5-325 MG TABLET    Take 1 tablet by mouth every 6 (six) hours as  needed for severe pain.   METHOCARBAMOL (ROBAXIN) 500 MG TABLET    Take 1 tablet (500 mg total) by mouth 2 (two) times daily.   METOCLOPRAMIDE (REGLAN) 10 MG TABLET    Take 1 tablet (10 mg total) by mouth every 6 (six) hours.   NAPROXEN (NAPROSYN) 500 MG TABLET    Take 1 tablet (500  mg total) by mouth 2 (two) times daily.     Lorayne Bender, PA-C 04/06/16 0128    Lorayne Bender, PA-C 04/06/16 0128    Everlene Balls, MD 04/06/16 [phone]

## 2016-04-05 NOTE — ED Notes (Signed)
Pt states she was carrying a ladder with clothes on it after a yard sale and her hands slipped and the ladder hit her in the head. No LOC. "Dazed" Appears sleepy, but responds approp to questioning.

## 2016-04-05 NOTE — ED Notes (Signed)
PA at bedside.

## 2016-04-05 NOTE — ED Triage Notes (Signed)
Pt in c/o head pain after an extension ladder fell on her head today. Denies n/v, endorses taking aspirin every day. Pt to triage in wheelchair, alert, interactive, in NAD.

## 2016-04-06 MED ORDER — NAPROXEN 500 MG PO TABS: 500.0000 mg | ORAL_TABLET | Freq: Two times a day (BID) | ORAL | 0 refills | Status: DC

## 2016-04-06 MED ORDER — METHOCARBAMOL 500 MG PO TABS: 500.0000 mg | ORAL_TABLET | Freq: Two times a day (BID) | ORAL | 0 refills | Status: DC

## 2016-04-06 MED ORDER — HYDROCODONE-ACETAMINOPHEN 5-325 MG PO TABS: 1.0000 | ORAL_TABLET | Freq: Four times a day (QID) | ORAL | 0 refills | Status: DC | PRN

## 2016-04-06 MED ORDER — DIPHENHYDRAMINE HCL 25 MG PO CAPS
50.0000 mg | ORAL_CAPSULE | Freq: Once | ORAL | Status: AC
  Administered 2016-04-06: 50 mg via ORAL
  Filled 2016-04-06: qty 2

## 2016-04-06 MED ORDER — METOCLOPRAMIDE HCL 10 MG PO TABS
10.0000 mg | ORAL_TABLET | Freq: Once | ORAL | Status: AC
  Administered 2016-04-06: 10 mg via ORAL
  Filled 2016-04-06: qty 1

## 2016-04-06 MED ORDER — METOCLOPRAMIDE HCL 10 MG PO TABS: 10.0000 mg | ORAL_TABLET | Freq: Four times a day (QID) | ORAL | 0 refills | Status: DC

## 2016-04-06 NOTE — Discharge Instructions (Signed)
Expect your soreness to increase over the next 2-3 days. Take it easy, but do not lay around too much as this may make the stiffness worse. Take 500 mg of naproxen every 12 hours or 800 mg of ibuprofen every 8 hours for the next 3 days. Take these medications with food to avoid upset stomach. Reglan for nausea. Robaxin is a muscle relaxer and may help loosen stiff muscles. Vicodin for severe pain. Do not take the Robaxin or Vicodin while driving or performing other dangerous activities. Please know that these medications can also affect your balance, so use extreme caution.

## 2016-04-09 NOTE — Progress Notes (Signed)
04/09/2016 A.Velma Agnes RNCM 2020pm EDCM called by [place] who reports patient presents with prescription for Vicodin which was cut in half.  Pharmacy requesting clarification on what was prescribed to patient on discharge.  EDCM confirmed that patient was prescribed Vicodin on 09/24 and provided other medications patient discharged with.  Pharmacy wanting to know if Vicodin prescription printed on its own.  [facility] informed pharmacist did not have this information.  No further EDCM needs at this time.

## 2017-04-20 IMAGING — MR MR HEAD WO/W CM
10 of 13 series · 33 of 48 positions shown · IV contrast (Yes   MULTIHANCE)
Comparison: Prior CT from 12/08/2015.

CLINICAL DATA: Initial evaluation for acute dizziness.

EXAM:
MRI HEAD WITHOUT AND WITH CONTRAST
TECHNIQUE: Multiplanar, multiecho pulse sequences of the brain and surrounding
structures were obtained without and with intravenous contrast.
CONTRAST:  14mL MULTIHANCE GADOBENATE DIMEGLUMINE 529 MG/ML IV SOLN

[Series 3: T1 · sagittal · 5.0mm · 0.47mm/px · 2 of 25 slices shown]
[im 1/25]
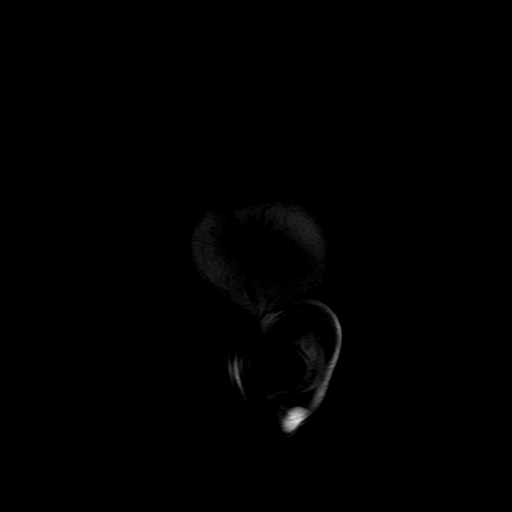
[im 13/25]
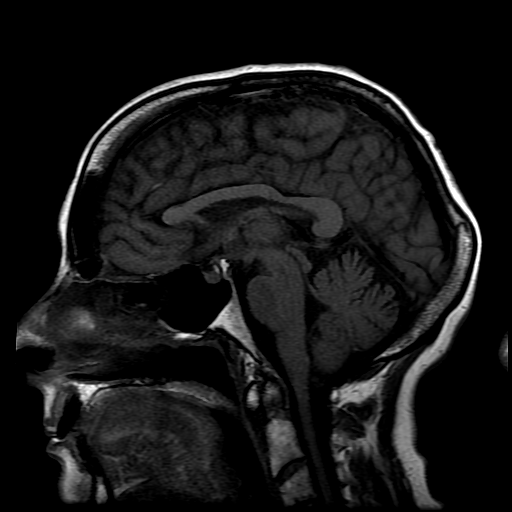

[Series 4: DWI · axial · 3.0mm · 1.09mm/px · z∈[-59,+91]mm · 8 of 102 slices shown (1 of 4)]
[im 1/102]
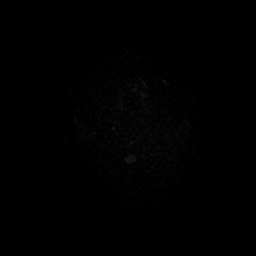
[im 15/102]
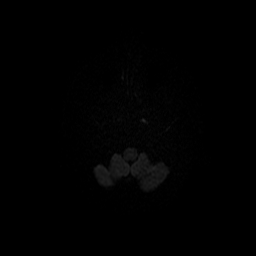
[im 29/102]
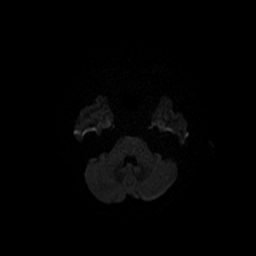
[im 44/102]
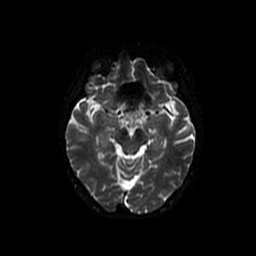
[im 58/102]
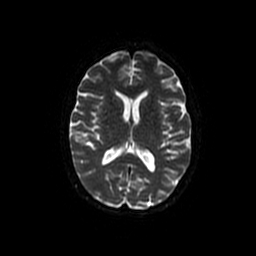
[im 73/102]
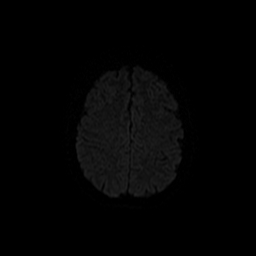
[im 87/102]
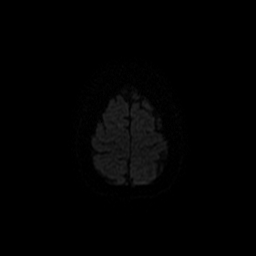
[im 102/102]
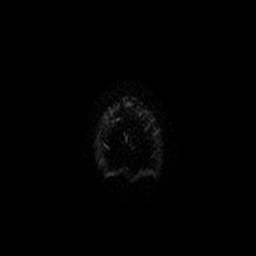

[Series 5: DWI · coronal · 5.0mm · 1.09mm/px · 6 of 68 slices shown (2 of 4)]
[im 1/68]
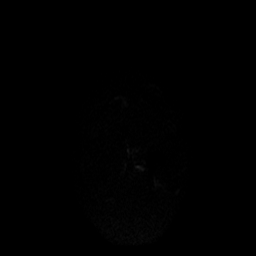
[im 14/68]
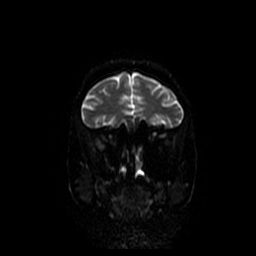
[im 27/68]
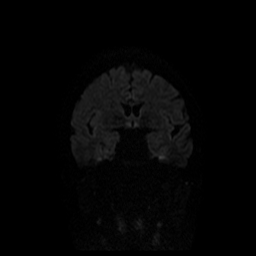
[im 41/68]
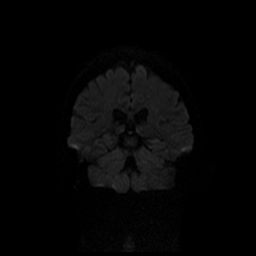
[im 54/68]
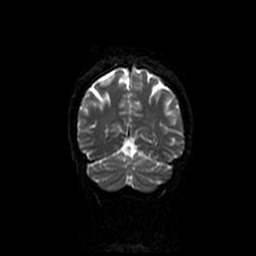
[im 68/68]
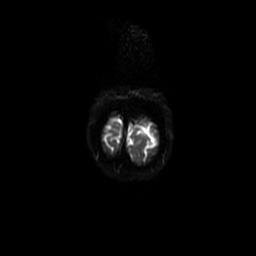

[Series 6: T2 · axial · 5.0mm · 0.43mm/px · z∈[-57,+93]mm · 2 of 26 slices shown]
[im 1/26]
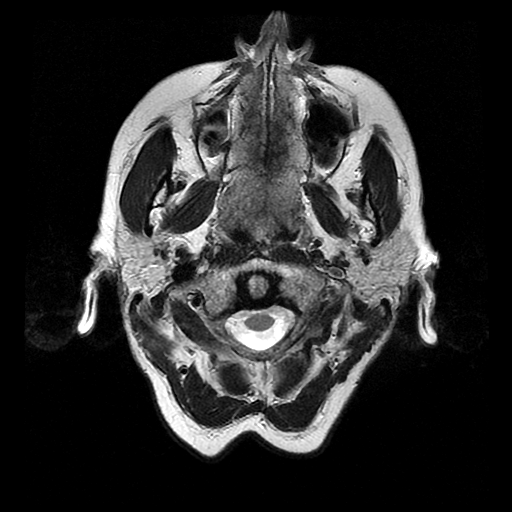
[im 26/26]
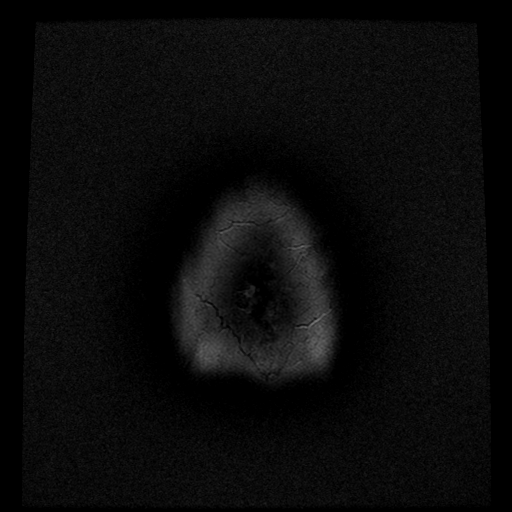

[Series 7: FLAIR · axial · 5.0mm · 0.43mm/px · z∈[-57,+93]mm · 2 of 26 slices shown]
[im 1/26]
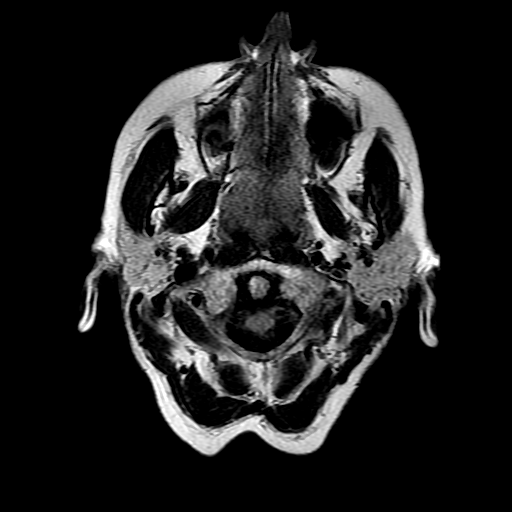
[im 26/26]
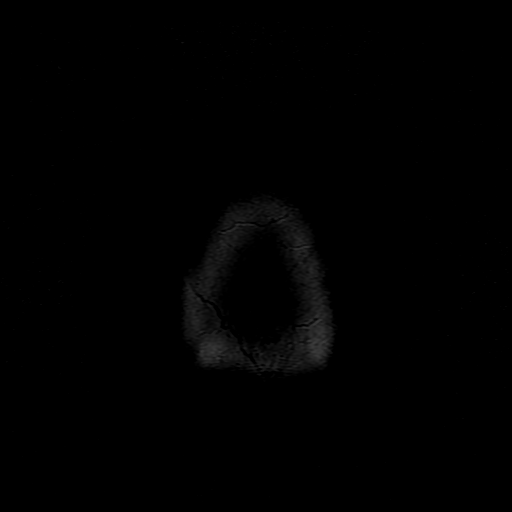

[Series 10: T2 post-contrast · coronal · 5.0mm · 0.39mm/px · 2 of 26 slices shown]
[im 1/26]
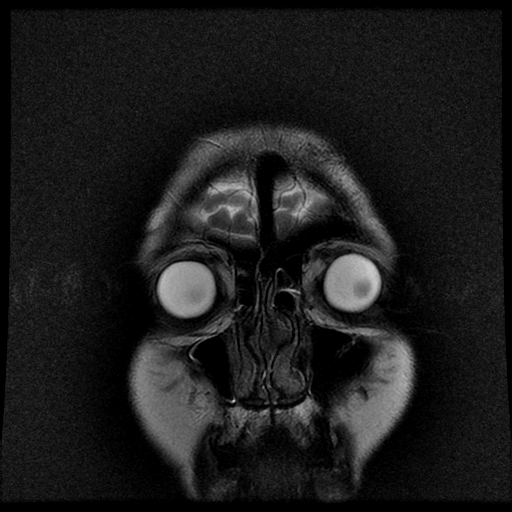
[im 26/26]
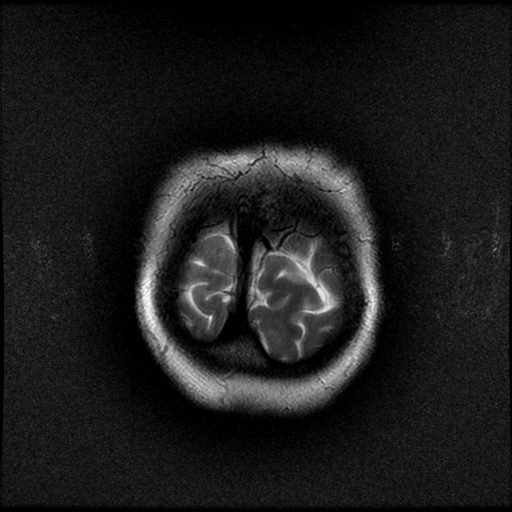

[Series 12: T1 post-contrast · coronal · 5.0mm · 0.39mm/px · 2 of 26 slices shown (1 of 2)]
[im 1/26]
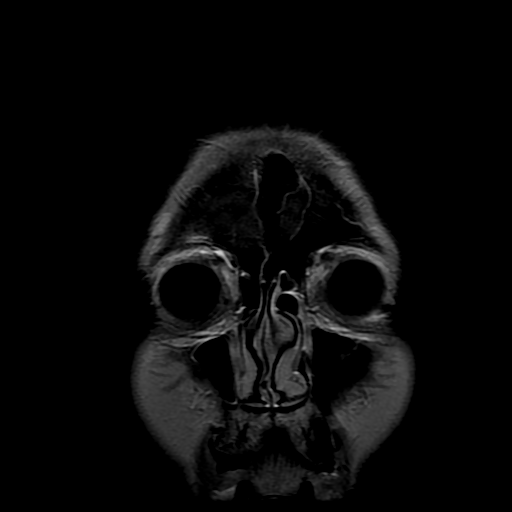
[im 26/26]
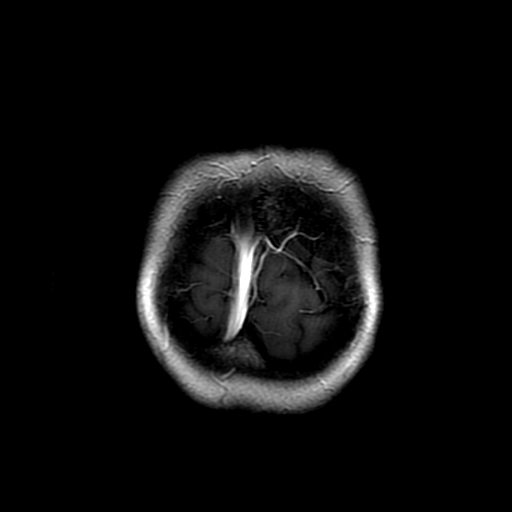

[Series 13: T1 post-contrast · sagittal · 5.0mm · 0.47mm/px · 2 of 25 slices shown (2 of 2)]
[im 1/25]
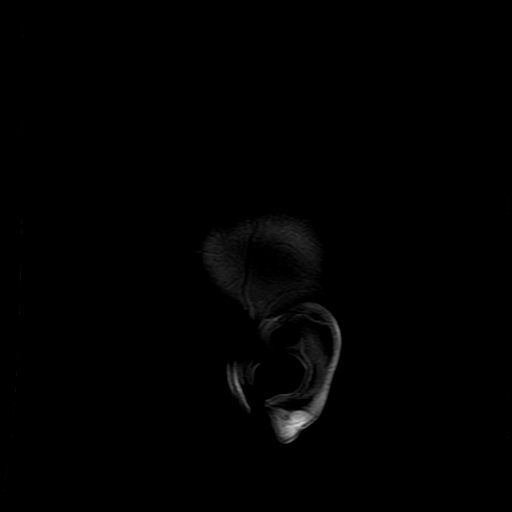
[im 25/25]
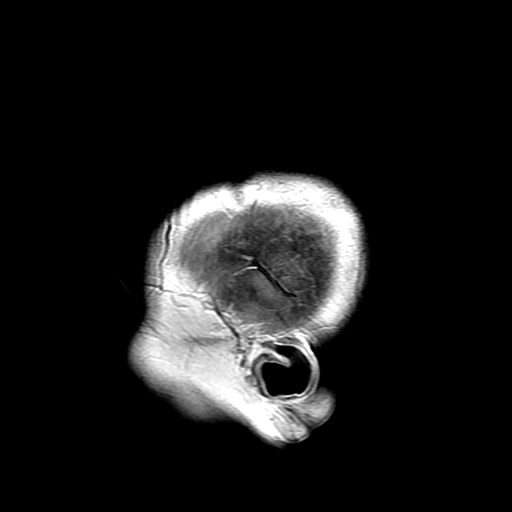

[Series 400: DWI · axial · 3.0mm · 1.09mm/px · z∈[-59,+91]mm · 4 of 51 slices shown (3 of 4)]
[im 1/51]
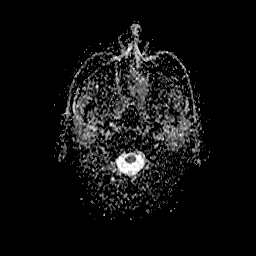
[im 17/51]
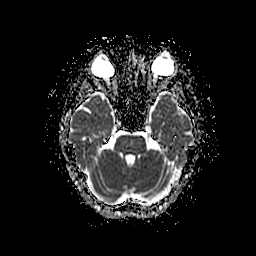
[im 34/51]
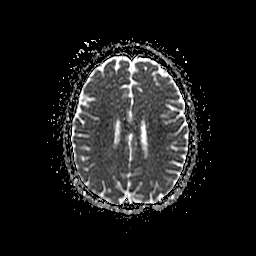
[im 51/51]
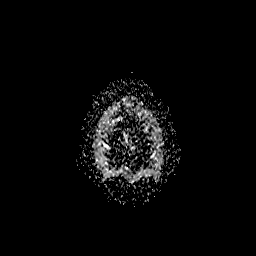

[Series 500: DWI · coronal · 5.0mm · 1.09mm/px · 3 of 34 slices shown (4 of 4)]
[im 1/34]
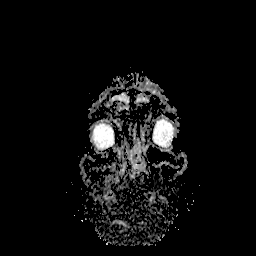
[im 17/34]
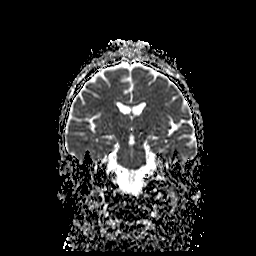
[im 34/34]
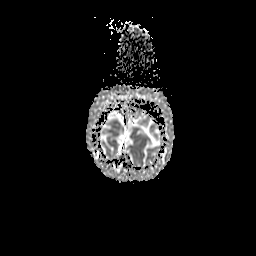

[33 of 48 positions shown; findings below may reference images not displayed]

FINDINGS: Cerebral volume within normal limits for patient age. Patchy
T2/FLAIR hyperintensity within the pons like related to chronic
small vessel ischemic disease. No other significant white matter
changes.

No abnormal foci of restricted diffusion to suggest acute
intracranial infarct. Gray-white matter differentiation well
maintained. Major intracranial vascular flow voids are preserved.
Left vertebral artery appears to be hypoplastic. No acute or chronic
intracranial hemorrhage. No areas of chronic infarction.

No mass lesion, midline shift, or mass effect. No hydrocephalus. No
extra-axial fluid collection. No abnormal enhancement. Major dural
sinuses are patent.

Craniocervical junction normal. Visualized upper cervical spine
unremarkable.

Pituitary gland within normal limits. No acute abnormality about the
orbits.

Mild scattered mucosal thickening within the paranasal sinuses. No
air-fluid levels to suggest active sinus infection. Small right
mastoid effusion noted. Inner ear structures and internal auditory
canals are grossly normal.

Bone marrow signal intensity within normal limits. No scalp soft
tissue abnormality.
IMPRESSION: 1. No acute intracranial process identified.
2. Mild chronic small vessel ischemic disease involving the pons.
3. Small right mastoid effusion.

## 2019-03-28 ENCOUNTER — Other Ambulatory Visit: Payer: Self-pay

## 2019-03-28 DIAGNOSIS — Z20822 Contact with and (suspected) exposure to covid-19: Secondary | ICD-10-CM

## 2019-03-29 LAB — NOVEL CORONAVIRUS, NAA: SARS-CoV-2, NAA: DETECTED — AB

## 2019-04-02 ENCOUNTER — Encounter (HOSPITAL_COMMUNITY): Payer: Self-pay | Admitting: Emergency Medicine

## 2019-04-02 ENCOUNTER — Emergency Department (HOSPITAL_COMMUNITY)
Admission: EM | Admit: 2019-04-02 | Discharge: 2019-04-03 | Disposition: A | Discharge status: Home / Self Care | Payer: BC Managed Care – PPO | Attending: Emergency Medicine | Admitting: Emergency Medicine

## 2019-04-02 ENCOUNTER — Other Ambulatory Visit: Payer: Self-pay

## 2019-04-02 DIAGNOSIS — Z7982 Long term (current) use of aspirin: Secondary | ICD-10-CM | POA: Insufficient documentation

## 2019-04-02 DIAGNOSIS — R8290 Unspecified abnormal findings in urine: Secondary | ICD-10-CM

## 2019-04-02 DIAGNOSIS — Z794 Long term (current) use of insulin: Secondary | ICD-10-CM | POA: Insufficient documentation

## 2019-04-02 DIAGNOSIS — F17210 Nicotine dependence, cigarettes, uncomplicated: Secondary | ICD-10-CM | POA: Insufficient documentation

## 2019-04-02 DIAGNOSIS — Z79899 Other long term (current) drug therapy: Secondary | ICD-10-CM | POA: Diagnosis not present

## 2019-04-02 DIAGNOSIS — E860 Dehydration: Secondary | ICD-10-CM

## 2019-04-02 DIAGNOSIS — E1140 Type 2 diabetes mellitus with diabetic neuropathy, unspecified: Secondary | ICD-10-CM | POA: Insufficient documentation

## 2019-04-02 DIAGNOSIS — U071 COVID-19: Secondary | ICD-10-CM | POA: Insufficient documentation

## 2019-04-02 DIAGNOSIS — I10 Essential (primary) hypertension: Secondary | ICD-10-CM | POA: Insufficient documentation

## 2019-04-02 DIAGNOSIS — R112 Nausea with vomiting, unspecified: Secondary | ICD-10-CM | POA: Diagnosis present

## 2019-04-02 DIAGNOSIS — E119 Type 2 diabetes mellitus without complications: Secondary | ICD-10-CM | POA: Insufficient documentation

## 2019-04-02 LAB — CBC
HCT: 38 % (ref 36.0–46.0)
Hemoglobin: 13.4 g/dL (ref 12.0–15.0)
MCH: 31.9 pg (ref 26.0–34.0)
MCHC: 35.3 g/dL (ref 30.0–36.0)
MCV: 90.5 fL (ref 80.0–100.0)
Platelets: 312 10*3/uL (ref 150–400)
RBC: 4.2 MIL/uL (ref 3.87–5.11)
RDW: 13 % (ref 11.5–15.5)
WBC: 7 10*3/uL (ref 4.0–10.5)
nRBC: 0 % (ref 0.0–0.2)

## 2019-04-02 MED ORDER — SODIUM CHLORIDE 0.9% FLUSH: 3.0000 mL | Freq: Once | INTRAVENOUS | Status: DC

## 2019-04-02 NOTE — ED Triage Notes (Signed)
Pt BIB [facility], c/o nausea/vomiting x 4 days. Tested COVID + x 1 week ago. EMS VSS.

## 2019-04-03 LAB — COMPREHENSIVE METABOLIC PANEL
ALT: 21 U/L (ref 0–44)
AST: 23 U/L (ref 15–41)
Albumin: 3.9 g/dL (ref 3.5–5.0)
Alkaline Phosphatase: 86 U/L (ref 38–126)
Anion gap: 11 (ref 5–15)
BUN: 10 mg/dL (ref 6–20)
CO2: 19 mmol/L — ABNORMAL LOW (ref 22–32)
Calcium: 9 mg/dL (ref 8.9–10.3)
Chloride: 97 mmol/L — ABNORMAL LOW (ref 98–111)
Creatinine, Ser: 0.87 mg/dL (ref 0.44–1.00)
GFR calc Af Amer: >60 mL/min (ref >60)
GFR calc non Af Amer: >60 mL/min (ref >60)
Glucose, Bld: 222 mg/dL — ABNORMAL HIGH (ref 70–99)
Potassium: 3.4 mmol/L — ABNORMAL LOW (ref 3.5–5.1)
Sodium: 127 mmol/L — ABNORMAL LOW (ref 135–145)
Total Bilirubin: 0.7 mg/dL (ref 0.3–1.2)
Total Protein: 7.2 g/dL (ref 6.5–8.1)

## 2019-04-03 LAB — URINALYSIS, ROUTINE W REFLEX MICROSCOPIC
Bilirubin Urine: NEGATIVE
Glucose, UA: NEGATIVE mg/dL
Hgb urine dipstick: NEGATIVE
Ketones, ur: 5 mg/dL — AB
Nitrite: NEGATIVE
Protein, ur: 100 mg/dL — AB
Specific Gravity, Urine: 1.016 (ref 1.005–1.030)
pH: 5 (ref 5.0–8.0)

## 2019-04-03 LAB — LIPASE, BLOOD: Lipase: 15 U/L (ref 11–51)

## 2019-04-03 LAB — I-STAT BETA HCG BLOOD, ED (MC, WL, AP ONLY): I-stat hCG, quantitative: <5 m[IU]/mL (ref <5)

## 2019-04-03 MED ORDER — SODIUM CHLORIDE 0.9 % IV BOLUS
1000.0000 mL | Freq: Once | INTRAVENOUS | Status: AC
  Administered 2019-04-03: 1000 mL via INTRAVENOUS

## 2019-04-03 MED ORDER — ONDANSETRON 4 MG PO TBDP: 4.0000 mg | ORAL_TABLET | Freq: Three times a day (TID) | ORAL | 0 refills | Status: DC | PRN

## 2019-04-03 MED ORDER — SODIUM CHLORIDE 0.9 % IV SOLN
1.0000 g | Freq: Once | INTRAVENOUS | Status: AC
  Administered 2019-04-03: 1 g via INTRAVENOUS
  Filled 2019-04-03: qty 10

## 2019-04-03 MED ORDER — CEPHALEXIN 500 MG PO CAPS: 500.0000 mg | ORAL_CAPSULE | Freq: Two times a day (BID) | ORAL | 0 refills | Status: DC

## 2019-04-03 MED ORDER — ONDANSETRON HCL 4 MG/2ML IJ SOLN
4.0000 mg | Freq: Once | INTRAMUSCULAR | Status: AC
  Administered 2019-04-03: 4 mg via INTRAVENOUS
  Filled 2019-04-03: qty 2

## 2019-04-03 NOTE — ED Provider Notes (Signed)
[facility] EMERGENCY DEPARTMENT Provider Note   CSN: [id number] Arrival date & time: 04/02/19  2317     History   Chief Complaint Chief Complaint  Patient presents with  . Abdominal Pain    HPI Michelle Nicholson is a 58 y.o. female.     Patient presents to the emergency department with a chief complaint of nausea and vomiting.  She states she was diagnosed with COVID-19 last week.  She reports that she has been having nausea and vomiting since Tuesday.  She has not been able to keep anything down.  She states that she did have some diarrhea as well.  She denies any abdominal pain, except for when she is vomiting.  She is concerned because 1 of her friends, who also tested positive for COVID-19, died yesterday from complications related to the coronavirus.  She denies any recent fevers.  She states that she feels lousy still.  She reports that she feels dehydrated.  The history is provided by the patient. No language interpreter was used.    Past Medical History:  Diagnosis Date  . Diabetes mellitus   . Hypertension   . Neuropathy   . Pancreas disorder     Patient Active Problem List   Diagnosis Date Noted  . Controlled type 2 diabetes mellitus with diabetic neuropathy, with long-term current use of insulin ([place]) 12/09/2015  . Essential hypertension 12/09/2015  . Meniere syndrome 12/09/2015    History reviewed. No pertinent surgical history.   OB History   No obstetric history on file.      Home Medications    Prior to Admission medications   Medication Sig Start Date End Date Taking? Authorizing Provider  aspirin 81 MG chewable tablet Chew 81 mg by mouth daily.    [provider]  atorvastatin (LIPITOR) 20 MG tablet Take 20 mg by mouth daily.    [provider]  Cholecalciferol (VITAMIN D PO) Take 5,000 Units by mouth 2 (two) times daily.     [provider]  Coenzyme Q10 (COQ10) 100 MG CAPS Take 100 mg by mouth daily.     [provider]  DHEA 10 MG TABS Take 15 mg by mouth daily.    [provider]  dorzolamide-timolol (COSOPT) 22.3-6.8 MG/ML ophthalmic solution Place 1 drop into both eyes 2 (two) times daily.    [provider]  gabapentin (NEURONTIN) 300 MG capsule Take 300 mg by mouth 2 (two) times daily.    [provider]  HYDROcodone-acetaminophen (NORCO/VICODIN) 5-325 MG tablet Take 1 tablet by mouth every 6 (six) hours as needed for severe pain. 04/06/16   Joy, Shawn C, PA-C  hydroxypropyl methylcellulose / hypromellose (ISOPTO TEARS / GONIOVISC) 2.5 % ophthalmic solution Place 1 drop into both eyes 2 (two) times daily.    [provider]  insulin aspart (NOVOLOG FLEXPEN) 100 UNIT/ML FlexPen Inject 4 Units into the skin 3 (three) times daily with meals. Plus sliding scale as needed for blood sugar levels    [provider]  Insulin Degludec (TRESIBA FLEXTOUCH) 100 UNIT/ML SOPN Inject 7 Units into the skin daily. 12/10/15   Nita Sells, MD  losartan (COZAAR) 50 MG tablet Take 50 mg by mouth daily.    [provider]  meclizine (ANTIVERT) 12.5 MG tablet Take 1 tablet (12.5 mg total) by mouth 3 (three) times daily as needed for dizziness. 12/10/15   Nita Sells, MD  Melatonin 5 MG TABS Take 5 mg by mouth at bedtime.  [provider]  Menaquinone-7 (VITAMIN K2) 100 MCG CAPS Take 150 mg by mouth daily.    [provider]  methocarbamol (ROBAXIN) 500 MG tablet Take 1 tablet (500 mg total) by mouth 2 (two) times daily. 04/06/16   Joy, Shawn C, PA-C  metoCLOPramide (REGLAN) 10 MG tablet Take 1 tablet (10 mg total) by mouth every 6 (six) hours. 04/06/16   Joy, Shawn C, PA-C  Multiple Vitamin (MULTIVITAMIN) capsule Take 1 capsule by mouth daily.    [provider]  naproxen (NAPROSYN) 500 MG tablet Take 1 tablet (500 mg total) by mouth 2 (two) times daily. 04/06/16   Joy, Shawn C, PA-C  Omega-3 Fatty Acids (FISH OIL)  1000 MG CAPS Take 1,000 mg by mouth daily.    [provider]  OVER THE COUNTER MEDICATION Take 75 mg by mouth daily. Pregnenolone    [provider]  Pancrelipase, Lip-Prot-Amyl, (CREON) 24000 UNITS CPEP Take 24,000 Units by mouth 2 (two) times daily.     [provider]  sertraline (ZOLOFT) 100 MG tablet Take 100 mg by mouth daily.    [provider]  Thyroid (NATURE-THROID) 97.5 MG TABS Take 1 tablet by mouth daily.    [provider]  Turmeric 500 MG CAPS Take 500 mg by mouth daily.    [provider]    Family History No family history on file.  Social History Social History   Tobacco Use  . Smoking status: Current Every Day Smoker  . Smokeless tobacco: Never Used  Substance Use Topics  . Alcohol use: No  . Drug use: No     Allergies   Patient has no known allergies.   Review of Systems Review of Systems  All other systems reviewed and are negative.    Physical Exam Updated Vital Signs BP (!) 188/77   Pulse 76   Temp 98.5 F (36.9 C) (Oral)   Resp 18   SpO2 100%   Physical Exam Vitals signs and nursing note reviewed.  Constitutional:      General: She is not in acute distress.    Appearance: She is well-developed.  HENT:     Head: Normocephalic and atraumatic.  Eyes:     Conjunctiva/sclera: Conjunctivae normal.  Neck:     Musculoskeletal: Neck supple.  Cardiovascular:     Rate and Rhythm: Normal rate and regular rhythm.     Heart sounds: No murmur.  Pulmonary:     Effort: Pulmonary effort is normal. No respiratory distress.     Breath sounds: Normal breath sounds.  Abdominal:     Palpations: Abdomen is soft.     Tenderness: There is no abdominal tenderness.     Comments: Soft and nontender  Musculoskeletal: Normal range of motion.  Skin:    General: Skin is warm and dry.  Neurological:     Mental Status: She is alert and oriented to person, place, and time.  Psychiatric:        Mood and  Affect: Mood normal.        Behavior: Behavior normal.      ED Treatments / Results  Labs (all labs ordered are listed, but only abnormal results are displayed) Labs Reviewed  COMPREHENSIVE METABOLIC PANEL - Abnormal; Notable for the following components:      Result Value   Sodium 127 (*)    Potassium 3.4 (*)    Chloride 97 (*)    CO2 19 (*)    Glucose, Bld 222 (*)  All other components within normal limits  URINALYSIS, ROUTINE W REFLEX MICROSCOPIC - Abnormal; Notable for the following components:   APPearance HAZY (*)    Ketones, ur 5 (*)    Protein, ur 100 (*)    Leukocytes,Ua SMALL (*)    Bacteria, UA RARE (*)    All other components within normal limits  LIPASE, BLOOD  CBC  I-STAT BETA HCG BLOOD, ED ([facility], [facility], [facility] ONLY)    EKG None  Radiology No results found.  Procedures Procedures (including critical care time)  Medications Ordered in ED Medications  sodium chloride flush (NS) 0.9 % injection 3 mL (has no administration in time range)     Initial Impression / Assessment and Plan / ED Course  I have reviewed the triage vital signs and the nursing notes.  Pertinent labs & imaging results that were available during my care of the patient were reviewed by me and considered in my medical decision making (see chart for details).        Patient with nausea and vomiting.  Diagnosed with COVID-19 last week.  She has labs, which were ordered in triage, which are notable for dehydration.  Questionable UTI.  Will give fluids, Zofran, and some Rocephin.  Patient is not hypoxic.  If she is feeling better after fluids, and tolerating oral intake, I believe that she can continue to manage her symptoms on an outpatient basis.  She will need to be prescribed an antiemetic.  Also consider coverage for abnormal urinalysis.  Patient signed out to [facility], PA-C, who will continue care.  Final Clinical Impressions(s) / ED Diagnoses   Final diagnoses:  None    ED  Discharge Orders    None       Montine Circle, PA-C 04/03/19 0601    Merryl Hacker, MD 04/17/19 2251

## 2019-04-03 NOTE — ED Notes (Signed)
Patient completed PO challenge - tolerated well. EDP aware and at bedside.

## 2019-04-03 NOTE — ED Notes (Signed)
Patient Alert and oriented to baseline. Stable and ambulatory to baseline. Patient verbalized understanding of the discharge instructions.  Patient belongings were taken by the patient.   

## 2019-04-03 NOTE — Discharge Instructions (Signed)
Your labs showed that you were in fact dehydrated.  We gave you fluids and nausea medicine in the ER, along with antibiotics for an abnormal urinalysis, which could be UTI.    Please take the nausea medicine and antibiotics as directed.   If you have any new or worsening symptoms, please return to the ER.

## 2019-04-03 NOTE — ED Notes (Signed)
Pt located in [place]

## 2019-04-03 NOTE — ED Provider Notes (Signed)
07:00: Assumed care of patient from [employer] PA-C at change of shift pending re-assessment & PO challenge.   Please see prior provider note for full H&P. Briefly patient is a 58 yo female recently diagnosed w/ COVID 19 who presented to the ED w/ complaints of N/V, inability to keep anything down. Recently had a friend that passed away from [place] 19 complications.   Physical Exam  BP (!) 179/62   Pulse 63   Temp 98.1 F (36.7 C) (Oral)   Resp 18   SpO2 97%   Physical Exam  ED Course/Procedures   Results for orders placed or performed during the hospital encounter of 04/02/19  Lipase, blood  Result Value Ref Range   Lipase 15 11 - 51 U/L  Comprehensive metabolic panel  Result Value Ref Range   Sodium 127 (L) 135 - 145 mmol/L   Potassium 3.4 (L) 3.5 - 5.1 mmol/L   Chloride 97 (L) 98 - 111 mmol/L   CO2 19 (L) 22 - 32 mmol/L   Glucose, Bld 222 (H) 70 - 99 mg/dL   BUN 10 6 - 20 mg/dL   Creatinine, Ser 0.87 0.44 - 1.00 mg/dL   Calcium 9.0 8.9 - 10.3 mg/dL   Total Protein 7.2 6.5 - 8.1 g/dL   Albumin 3.9 3.5 - 5.0 g/dL   AST 23 15 - 41 U/L   ALT 21 0 - 44 U/L   Alkaline Phosphatase 86 38 - 126 U/L   Total Bilirubin 0.7 0.3 - 1.2 mg/dL   GFR calc non Af Amer >60 >60 mL/min   GFR calc Af Amer >60 >60 mL/min   Anion gap 11 5 - 15  CBC  Result Value Ref Range   WBC 7.0 4.0 - 10.5 K/uL   RBC 4.20 3.87 - 5.11 MIL/uL   Hemoglobin 13.4 12.0 - 15.0 g/dL   HCT 38.0 36.0 - 46.0 %   MCV 90.5 80.0 - 100.0 fL   MCH 31.9 26.0 - 34.0 pg   MCHC 35.3 30.0 - 36.0 g/dL   RDW 13.0 11.5 - 15.5 %   Platelets 312 150 - 400 K/uL   nRBC 0.0 0.0 - 0.2 %  Urinalysis, Routine w reflex microscopic  Result Value Ref Range   Color, Urine YELLOW YELLOW   APPearance HAZY (A) CLEAR   Specific Gravity, Urine 1.016 1.005 - 1.030   pH 5.0 5.0 - 8.0   Glucose, UA NEGATIVE NEGATIVE mg/dL   Hgb urine dipstick NEGATIVE NEGATIVE   Bilirubin Urine NEGATIVE NEGATIVE   Ketones, ur 5 (A) NEGATIVE mg/dL   Protein, ur 100 (A) NEGATIVE mg/dL   Nitrite NEGATIVE NEGATIVE   Leukocytes,Ua SMALL (A) NEGATIVE   RBC / HPF 0-5 0 - 5 RBC/hpf   WBC, UA 11-20 0 - 5 WBC/hpf   Bacteria, UA RARE (A) NONE SEEN   Squamous Epithelial / LPF 6-10 0 - 5   Mucus PRESENT    Hyaline Casts, UA PRESENT   I-Stat beta hCG blood, ED  Result Value Ref Range   I-stat hCG, quantitative <5.0 <5 mIU/mL   Comment 3           No results found.  Procedures  MDM   Labs w/ possible UTI- started on Rocephin.  Renal function preserved.  Mild electrolyte abnormalities as above.   Received 2L NS, rocephin & zofran.  On re-assessment patient is tolerating PO food/fluids, she is feeling improved, we discussed options of her disposition and she is requesting to  go home as opposed to staying in the hospital which I feel is reasonable. Discharge instructions prepared by prior provider w/ anti-emetics & abx. I discussed results, treatment plan, need for follow-up, and very strict return precautions with the patient. Provided opportunity for questions, patient confirmed understanding and is in agreement with plan.       Amaryllis Dyke, PA-C 04/03/19 1028    Lucrezia Starch, MD 04/04/19 [phone]

## 2019-04-04 LAB — URINE CULTURE: Culture: NO GROWTH

## 2019-04-26 ENCOUNTER — Other Ambulatory Visit: Payer: Self-pay

## 2019-04-26 DIAGNOSIS — Z20822 Contact with and (suspected) exposure to covid-19: Secondary | ICD-10-CM

## 2019-04-28 LAB — NOVEL CORONAVIRUS, NAA: SARS-CoV-2, NAA: NOT DETECTED

## 2020-05-07 ENCOUNTER — Encounter (HOSPITAL_BASED_OUTPATIENT_CLINIC_OR_DEPARTMENT_OTHER): Payer: Self-pay | Admitting: *Deleted

## 2020-05-07 ENCOUNTER — Emergency Department (HOSPITAL_BASED_OUTPATIENT_CLINIC_OR_DEPARTMENT_OTHER)
Admission: EM | Admit: 2020-05-07 | Discharge: 2020-05-07 | Disposition: A | Discharge status: Home / Self Care | Payer: BC Managed Care – PPO | Attending: Emergency Medicine | Admitting: Emergency Medicine

## 2020-05-07 ENCOUNTER — Emergency Department (HOSPITAL_BASED_OUTPATIENT_CLINIC_OR_DEPARTMENT_OTHER): Payer: BC Managed Care – PPO

## 2020-05-07 ENCOUNTER — Other Ambulatory Visit: Payer: Self-pay

## 2020-05-07 DIAGNOSIS — J449 Chronic obstructive pulmonary disease, unspecified: Secondary | ICD-10-CM | POA: Insufficient documentation

## 2020-05-07 DIAGNOSIS — Z79899 Other long term (current) drug therapy: Secondary | ICD-10-CM | POA: Insufficient documentation

## 2020-05-07 DIAGNOSIS — Z20822 Contact with and (suspected) exposure to covid-19: Secondary | ICD-10-CM | POA: Diagnosis not present

## 2020-05-07 DIAGNOSIS — E1140 Type 2 diabetes mellitus with diabetic neuropathy, unspecified: Secondary | ICD-10-CM | POA: Diagnosis not present

## 2020-05-07 DIAGNOSIS — Z7982 Long term (current) use of aspirin: Secondary | ICD-10-CM | POA: Insufficient documentation

## 2020-05-07 DIAGNOSIS — I10 Essential (primary) hypertension: Secondary | ICD-10-CM | POA: Insufficient documentation

## 2020-05-07 DIAGNOSIS — J9801 Acute bronchospasm: Secondary | ICD-10-CM | POA: Diagnosis not present

## 2020-05-07 DIAGNOSIS — F17200 Nicotine dependence, unspecified, uncomplicated: Secondary | ICD-10-CM | POA: Diagnosis not present

## 2020-05-07 DIAGNOSIS — R0602 Shortness of breath: Secondary | ICD-10-CM

## 2020-05-07 DIAGNOSIS — Z794 Long term (current) use of insulin: Secondary | ICD-10-CM | POA: Insufficient documentation

## 2020-05-07 LAB — CBC WITH DIFFERENTIAL/PLATELET
Abs Immature Granulocytes: 0.06 10*3/uL (ref 0.00–0.07)
Basophils Absolute: 0.1 10*3/uL (ref 0.0–0.1)
Basophils Relative: 1 %
Eosinophils Absolute: 0.4 10*3/uL (ref 0.0–0.5)
Eosinophils Relative: 5 %
HCT: 31.3 % — ABNORMAL LOW (ref 36.0–46.0)
Hemoglobin: 10.3 g/dL — ABNORMAL LOW (ref 12.0–15.0)
Immature Granulocytes: 1 %
Lymphocytes Relative: 30 %
Lymphs Abs: 2.3 10*3/uL (ref 0.7–4.0)
MCH: 30.4 pg (ref 26.0–34.0)
MCHC: 32.9 g/dL (ref 30.0–36.0)
MCV: 92.3 fL (ref 80.0–100.0)
Monocytes Absolute: 0.6 10*3/uL (ref 0.1–1.0)
Monocytes Relative: 8 %
Neutro Abs: 4.3 10*3/uL (ref 1.7–7.7)
Neutrophils Relative %: 55 %
Platelets: 267 10*3/uL (ref 150–400)
RBC: 3.39 MIL/uL — ABNORMAL LOW (ref 3.87–5.11)
RDW: 13.3 % (ref 11.5–15.5)
WBC: 7.6 10*3/uL (ref 4.0–10.5)
nRBC: 0 % (ref 0.0–0.2)

## 2020-05-07 LAB — TROPONIN I (HIGH SENSITIVITY)
Troponin I (High Sensitivity): 3 ng/L (ref <18)
Troponin I (High Sensitivity): 4 ng/L (ref <18)

## 2020-05-07 LAB — BASIC METABOLIC PANEL
Anion gap: 8 (ref 5–15)
BUN: 15 mg/dL (ref 8–23)
CO2: 19 mmol/L — ABNORMAL LOW (ref 22–32)
Calcium: 8.7 mg/dL — ABNORMAL LOW (ref 8.9–10.3)
Chloride: 104 mmol/L (ref 98–111)
Creatinine, Ser: 0.92 mg/dL (ref 0.44–1.00)
GFR, Estimated: >60 mL/min (ref >60)
Glucose, Bld: 140 mg/dL — ABNORMAL HIGH (ref 70–99)
Potassium: 3.5 mmol/L (ref 3.5–5.1)
Sodium: 131 mmol/L — ABNORMAL LOW (ref 135–145)

## 2020-05-07 LAB — RESPIRATORY PANEL BY RT PCR (FLU A&B, COVID)
Influenza A by PCR: NEGATIVE
Influenza B by PCR: NEGATIVE
SARS Coronavirus 2 by RT PCR: NEGATIVE

## 2020-05-07 MED ORDER — ALBUTEROL SULFATE HFA 108 (90 BASE) MCG/ACT IN AERS
4.0000 | INHALATION_SPRAY | Freq: Once | RESPIRATORY_TRACT | Status: AC
  Administered 2020-05-07: 4 via RESPIRATORY_TRACT
  Filled 2020-05-07: qty 6.7

## 2020-05-07 MED ORDER — IOHEXOL 350 MG/ML SOLN
100.0000 mL | Freq: Once | INTRAVENOUS | Status: AC | PRN
  Administered 2020-05-07: 100 mL via INTRAVENOUS

## 2020-05-07 MED ORDER — METHYLPREDNISOLONE SODIUM SUCC 125 MG IJ SOLR
125.0000 mg | Freq: Once | INTRAMUSCULAR | Status: AC
  Administered 2020-05-07: 125 mg via INTRAVENOUS
  Filled 2020-05-07: qty 2

## 2020-05-07 MED ORDER — PREDNISONE 20 MG PO TABS: 40.0000 mg | ORAL_TABLET | Freq: Every day | ORAL | 0 refills | Status: AC

## 2020-05-07 NOTE — Discharge Instructions (Signed)
Evaluation today has been reassuring.  CT shows no evidence of blood clot in the lung, your cardiac labs and EKG look good.  You did have some wheezing on exam today, will treat for possible COPD or asthma exacerbation with short burst of steroids, please begin taking these tomorrow, I would also like for you to use your albuterol inhaler every 4-6 hours for the next 24 hours and then as needed.  Please call to schedule follow-up with your PCP and pulmonologist.  I have also made a referral for you to cardiology if you do not hear from them in the next week please call to schedule follow-up.  If you develop any worsening symptoms please return to the ED.

## 2020-05-07 NOTE — ED Provider Notes (Signed)
[facility] EMERGENCY DEPARTMENT Provider Note   CSN: [id number] Arrival date & time: 05/07/20  1856     History Chief Complaint  Patient presents with  . Shortness of Breath    Michelle Nicholson is a 59 y.o. female.  Michelle Nicholson is a 59 y.o. F with a recent history of vascular surgery 2 weeks ago and a significant PMHx of COPD, asthma, HTN, and T2DM who presents to the ED today from advice of her PCP for the chief complaint of "shortness of breath". Michelle Nicholson states that she underwent vascular surgery and stent placement in her LLE due to occlusive disease 2 weeks ago. She states that on Friday her left lower ankle was very painful and swollen and that she started having shortness of breath at that time.  .  She states that she became significantly short of breath Saturday night.  She states shortness of breath is present with exertion and improves with rest.  Michelle Nicholson states that she used her home pulse oximeter and that her oxygen saturation initially was in the high 80s on Saturday night but after using albuterol O2 sats immediately improved to the 90s. She states that her LLE swelling has decreased, but that she continues to have dyspnea especially on exertion.  Throughout these episodes of shortness of breath she denies experiencing any chest pain, lightheadedness or syncope.  No diaphoresis, nausea, vomiting or abdominal pain.  Michelle Nicholson has tried using her rescue inhaler today without relief and called her PCP but they did not have appointments available so sent her to urgent care for evaluation.  At urgent care did a workup to include CXR and Covid and flu tests which were all within normal limits. However, due to her recent surgical history she was encouraged to come to the ED for her SOB. Michelle Nicholson also endorses a family hx of cardiovascular disease, with her father passing away from an MI in his 60s.         Past Medical History:  Diagnosis Date  . Diabetes mellitus   .  Hypertension   . Neuropathy   . Pancreas disorder     Patient Active Problem List   Diagnosis Date Noted  . Controlled type 2 diabetes mellitus with diabetic neuropathy, with long-term current use of insulin ([place]) 12/09/2015  . Essential hypertension 12/09/2015  . Meniere syndrome 12/09/2015    History reviewed. No pertinent surgical history.   OB History   No obstetric history on file.     No family history on file.  Social History   Tobacco Use  . Smoking status: Current Every Day Smoker  . Smokeless tobacco: Never Used  Substance Use Topics  . Alcohol use: No  . Drug use: No    Home Medications Prior to Admission medications   Medication Sig Start Date End Date Taking? Authorizing Provider  aspirin 81 MG chewable tablet Chew 81 mg by mouth daily.    [provider]  atorvastatin (LIPITOR) 20 MG tablet Take 20 mg by mouth daily.    [provider]  cephALEXin (KEFLEX) 500 MG capsule Take 1 capsule (500 mg total) by mouth 2 (two) times daily. 04/03/19   Montine Circle, PA-C  Cholecalciferol (VITAMIN D PO) Take 5,000 Units by mouth 2 (two) times daily.     [provider]  Coenzyme Q10 (COQ10) 100 MG CAPS Take 100 mg by mouth daily.    [provider]  DHEA 10 MG TABS Take 15 mg  by mouth daily.    [provider]  dorzolamide-timolol (COSOPT) 22.3-6.8 MG/ML ophthalmic solution Place 1 drop into both eyes 2 (two) times daily.    [provider]  gabapentin (NEURONTIN) 300 MG capsule Take 300 mg by mouth 2 (two) times daily.    [provider]  HYDROcodone-acetaminophen (NORCO/VICODIN) 5-325 MG tablet Take 1 tablet by mouth every 6 (six) hours as needed for severe pain. 04/06/16   Joy, Shawn C, PA-C  hydroxypropyl methylcellulose / hypromellose (ISOPTO TEARS / GONIOVISC) 2.5 % ophthalmic solution Place 1 drop into both eyes 2 (two) times daily.    [provider]  insulin aspart (NOVOLOG FLEXPEN) 100  UNIT/ML FlexPen Inject 4 Units into the skin 3 (three) times daily with meals. Plus sliding scale as needed for blood sugar levels    [provider]  Insulin Degludec (TRESIBA FLEXTOUCH) 100 UNIT/ML SOPN Inject 7 Units into the skin daily. 12/10/15   Nita Sells, MD  losartan (COZAAR) 50 MG tablet Take 50 mg by mouth daily.    [provider]  meclizine (ANTIVERT) 12.5 MG tablet Take 1 tablet (12.5 mg total) by mouth 3 (three) times daily as needed for dizziness. 12/10/15   Nita Sells, MD  Melatonin 5 MG TABS Take 5 mg by mouth at bedtime.    [provider]  Menaquinone-7 (VITAMIN K2) 100 MCG CAPS Take 150 mg by mouth daily.    [provider]  methocarbamol (ROBAXIN) 500 MG tablet Take 1 tablet (500 mg total) by mouth 2 (two) times daily. 04/06/16   Joy, Shawn C, PA-C  metoCLOPramide (REGLAN) 10 MG tablet Take 1 tablet (10 mg total) by mouth every 6 (six) hours. 04/06/16   Joy, Shawn C, PA-C  Multiple Vitamin (MULTIVITAMIN) capsule Take 1 capsule by mouth daily.    [provider]  naproxen (NAPROSYN) 500 MG tablet Take 1 tablet (500 mg total) by mouth 2 (two) times daily. 04/06/16   Joy, Shawn C, PA-C  Omega-3 Fatty Acids (FISH OIL) 1000 MG CAPS Take 1,000 mg by mouth daily.    [provider]  ondansetron (ZOFRAN ODT) 4 MG disintegrating tablet Take 1 tablet (4 mg total) by mouth every 8 (eight) hours as needed. 04/03/19   Montine Circle, PA-C  OVER THE COUNTER MEDICATION Take 75 mg by mouth daily. Pregnenolone    [provider]  Pancrelipase, Lip-Prot-Amyl, (CREON) 24000 UNITS CPEP Take 24,000 Units by mouth 2 (two) times daily.     [provider]  predniSONE (DELTASONE) 20 MG tablet Take 2 tablets (40 mg total) by mouth daily for 5 days. 05/07/20 05/12/20  Jacqlyn Larsen, PA-C  sertraline (ZOLOFT) 100 MG tablet Take 100 mg by mouth daily.    [provider]  Thyroid (NATURE-THROID) 97.5 MG TABS  Take 1 tablet by mouth daily.    [provider]  Turmeric 500 MG CAPS Take 500 mg by mouth daily.    [provider]    Allergies    Patient has no known allergies.  Review of Systems   Review of Systems  Constitutional: Positive for fatigue. Negative for chills and fever.  HENT: Negative.   Respiratory: Positive for cough and shortness of breath. Negative for chest tightness and wheezing.   Cardiovascular: Positive for leg swelling. Negative for chest pain and palpitations.  Gastrointestinal: Negative for abdominal pain, nausea and vomiting.  Genitourinary: Negative for dysuria and frequency.  Musculoskeletal: Negative for arthralgias and myalgias.  Skin: Negative for  color change and rash.  Neurological: Negative for dizziness, syncope and light-headedness.  All other systems reviewed and are negative.   Physical Exam Updated Vital Signs BP (!) 151/86   Pulse 75   Temp 98.5 F (36.9 C) (Oral)   Resp 16   Ht 5\' 9"  (1.753 m)   Wt 68 kg   SpO2 100%   BMI 22.15 kg/m   Physical Exam Vitals and nursing note reviewed.  Constitutional:      General: She is not in acute distress.    Appearance: She is well-developed. She is not diaphoretic.     Comments: Patient is alert, somewhat ill-appearing but in no acute distress  HENT:     Head: Normocephalic and atraumatic.     Mouth/Throat:     Mouth: Mucous membranes are moist.     Pharynx: Oropharynx is clear.  Eyes:     General:        Right eye: No discharge.        Left eye: No discharge.     Pupils: Pupils are equal, round, and reactive to light.  Cardiovascular:     Rate and Rhythm: Normal rate and regular rhythm.     Heart sounds: Normal heart sounds. No murmur heard.  No friction rub. No gallop.   Pulmonary:     Effort: Pulmonary effort is normal. Tachypnea present. No respiratory distress.     Breath sounds: Decreased breath sounds and wheezing present. No rales.     Comments: Patient is mildly  tachypneic but without significantly increased respiratory effort, able to speak in full sentences, with normal sats while at rest, lungs with decreased breath sounds bilaterally with some faint end expiratory wheezing anteriorly, no rales or rhonchi Chest:     Chest wall: No tenderness.  Abdominal:     General: Bowel sounds are normal. There is no distension.     Palpations: Abdomen is soft. There is no mass.     Tenderness: There is no abdominal tenderness. There is no guarding.  Musculoskeletal:        General: No deformity.     Cervical back: Neck supple.     Right lower leg: No tenderness. No edema.     Left lower leg: No tenderness. No edema.     Comments: Bilateral lower extremities warm and well perfused without edema, 2+ distal pulses  Skin:    General: Skin is warm and dry.     Capillary Refill: Capillary refill takes less than 2 seconds.  Neurological:     Mental Status: She is alert and oriented to person, place, and time.     Coordination: Coordination normal.     Comments: Speech is clear, able to follow commands Moves extremities without ataxia, coordination intact  Psychiatric:        Mood and Affect: Mood normal.        Behavior: Behavior normal.     ED Results / Procedures / Treatments   Labs (all labs ordered are listed, but only abnormal results are displayed) Labs Reviewed  BASIC METABOLIC PANEL - Abnormal; Notable for the following components:      Result Value   Sodium 131 (*)    CO2 19 (*)    Glucose, Bld 140 (*)    Calcium 8.7 (*)    All other components within normal limits  CBC WITH DIFFERENTIAL/PLATELET - Abnormal; Notable for the following components:   RBC 3.39 (*)    Hemoglobin 10.3 (*)  HCT 31.3 (*)    All other components within normal limits  RESPIRATORY PANEL BY RT PCR (FLU A&B, COVID)  TROPONIN I (HIGH SENSITIVITY)  TROPONIN I (HIGH SENSITIVITY)    EKG EKG Interpretation  Date/Time:  Monday May 07 2020 19:56:46  EDT Ventricular Rate:  68 PR Interval:    QRS Duration: 90 QT Interval:  426 QTC Calculation: 751 R Axis:   79 Text Interpretation: Sinus rhythm Minimal ST elevation, inferior leads No significant change since last tracing Confirmed by Calvert Cantor [phone]) on 05/07/2020 8:22:38 PM   Radiology CT Angio Chest PE W and/or Wo Contrast  Result Date: 05/07/2020 CLINICAL DATA:  Shortness of breath for 2 weeks, PE suspected EXAM: CT ANGIOGRAPHY CHEST WITH CONTRAST TECHNIQUE: Multidetector CT imaging of the chest was performed using the standard protocol during bolus administration of intravenous contrast. Multiplanar CT image reconstructions and MIPs were obtained to evaluate the vascular anatomy. CONTRAST:  12mL OMNIPAQUE IOHEXOL 350 MG/ML SOLN COMPARISON:  Chest radiograph 05/07/2020, CT abdomen and pelvis 11/03/2017, MR abdomen 12/20/2010 FINDINGS: Cardiovascular: Satisfactory opacification the pulmonary arteries to the segmental level. No pulmonary artery filling defects are identified. Central pulmonary arteries are normal caliber. Normal heart size. No pericardial effusion. Three-vessel coronary artery atherosclerosis is seen. The aortic root is suboptimally assessed given cardiac pulsation artifact. Atherosclerotic plaque within the normal caliber aorta. No acute luminal abnormality of the imaged aorta. No periaortic stranding or hemorrhage. Normal 3 vessel branching of the aortic arch. Proximal great vessels are calcified but otherwise unremarkable. Few foci of gas in the brachiocephalic veins, likely related to intravenous access. No other major venous abnormalities are seen. Mediastinum/Nodes: No mediastinal fluid or gas. Normal thyroid gland and thoracic inlet. No acute abnormality of the trachea or esophagus. No worrisome mediastinal, hilar or axillary adenopathy. Lungs/Pleura: Mosaic regions of attenuation the lungs may reflect areas of air trapping or small airways disease versus atelectatic  changes accentuated by imaging during exhalation for the angiographic technique. Left apical bullous changes. No consolidation, features of edema, pneumothorax, or effusion. Bandlike area of opacity in the right middle lobe, likely reflecting scarring given similarity to comparison. No suspicious pulmonary nodules or masses. Upper Abdomen: Stable regions of calcifications seen in the posterior right lobe liver, likely reflecting sequela of prior granulomatous disease, and reportedly unchanged since at least 2013. No acute abnormalities present in the visualized portions of the upper abdomen. Vascular calcifications seen in the upper abdomen Musculoskeletal: Multilevel degenerative changes are present in the imaged portions of the spine. Mild some mild endplate deformities T5 and T6 likely accentuated by superimposed Schmorl's node formations. Evaluation for rib and sternal fractures limited by respiratory motion artifact. No acute or worrisome osseous abnormality. The osseous structures appear diffusely demineralized which may limit detection of small or nondisplaced fractures. Review of the MIP images confirms the above findings. IMPRESSION: 1. No evidence of acute pulmonary artery filling defect to suggest pulmonary embolism. 2. Mosaic regions of attenuation the lungs may reflect areas of air trapping or small airways disease versus atelectatic changes accentuated by imaging during exhalation for the angiographic technique. 3. Three-vessel coronary artery atherosclerosis. 4. Stable calcifications in the posterior right lobe liver, likely reflecting sequela of prior granulomatous disease, reportedly unchanged since at least 2013. 5. Aortic Atherosclerosis (ICD10-I70.0). Electronically Signed   By: Lovena Le M.D.   On: 05/07/2020 20:54    Procedures Procedures (including critical care time)  Medications Ordered in ED Medications  albuterol (VENTOLIN HFA) 108 (90 Base) MCG/ACT inhaler  4 puff (4 puffs  Inhalation Given 05/07/20 2022)  methylPREDNISolone sodium succinate (SOLU-MEDROL) 125 mg/2 mL injection 125 mg (125 mg Intravenous Given 05/07/20 2013)  iohexol (OMNIPAQUE) 350 MG/ML injection 100 mL (100 mLs Intravenous Contrast Given 05/07/20 2039)    ED Course  I have reviewed the triage vital signs and the nursing notes.  Pertinent labs & imaging results that were available during my care of the patient were reviewed by me and considered in my medical decision making (see chart for details).    MDM Rules/Calculators/A&P                          60 year old female presents with shortness of breath, she states ever since she had a Covid infection a little over a year ago she has had some chronic shortness of breath but over the past 3 days it has been worse primarily with exertion.  She recently had a vascular surgery done on her lower extremity, is on aspirin and Plavix, had some swelling and pain on Friday and one of the lower legs but that has since improved but shortness of breath has persisted.  Was seen and evaluated at urgent care today, had negative Covid antigen testing clear chest x-ray, but she was sent to the ED for further evaluation, they expressed concern for potential cardiac process given patient's personal and family history as well as concern for PE.  Currently at rest patient is in no acute respiratory distress, satting well on room air, she does have some wheezing present anteriorly and has a history of reactive airway disease.  Will treat with albuterol and steroids, will get basic labs, troponin and CT angio study of the chest, will also get Covid PCR.  I have independently ordered, reviewed and interpreted all labs and imaging: CBC: No leukocytosis, stable hemoglobin CMP: Sodium of 131, CO2 19, glucose 140, no other significant electrolyte derangements, normal renal function Troponin: Negative x2 EKG: No concerning ischemic changes Covid: Negative  CT angio of the  chest with no evidence of PE, there is some mosaic regions of attenuation that may represent air trapping or small airway disease versus atelectasis, suspect this may be related to patient's asthma and COPD.  Patient does have some three-vessel coronary artery disease noted on scan but lab work and EKG do not suggest acute ischemia.  Discussed reassuring CT results with patient as well as reassuring lab work-up.  Will treat with short course of steroids and continued use of albuterol will have patient follow-up closely with PCP and pulmonology for continued evaluation of shortness of breath.  We will also refer her to cardiology for further evaluation given three-vessel disease noted on CT today, question whether this could be contributing to her shortness of breath as well.  Do not think she needs emergent stress testing or evaluation but feel she would benefit from outpatient cardiology input.   At this time there does not appear to be any evidence of an acute emergency medical condition and the patient appears stable for discharge with appropriate outpatient follow up. Diagnosis was discussed with patient who verbalizes understanding and is agreeable to discharge.    Final Clinical Impression(s) / ED Diagnoses Final diagnoses:  SOB (shortness of breath)  Bronchospasm    Rx / DC Orders ED Discharge Orders         Ordered    predniSONE (DELTASONE) 20 MG tablet  Daily        05/07/20  2319    Ambulatory referral to Cardiology        05/07/20 2319           Janet Berlin 05/08/20 3009    Truddie Hidden, MD 05/08/20 [phone]

## 2020-05-07 NOTE — ED Notes (Signed)
Pt ambulated.  Sats remained 97-98% RA, HR 85-90.  Pt was tachypnea and reports feeling SOB-talking while ambulating.  EDP aware

## 2020-05-07 NOTE — ED Triage Notes (Signed)
Sent here by PMD for SOB r/o PE

## 2020-05-16 ENCOUNTER — Encounter: Payer: Self-pay | Admitting: General Practice

## 2021-04-20 ENCOUNTER — Encounter (HOSPITAL_BASED_OUTPATIENT_CLINIC_OR_DEPARTMENT_OTHER): Payer: Self-pay | Admitting: Emergency Medicine

## 2021-04-20 ENCOUNTER — Other Ambulatory Visit: Payer: Self-pay

## 2021-04-20 ENCOUNTER — Inpatient Hospital Stay (HOSPITAL_BASED_OUTPATIENT_CLINIC_OR_DEPARTMENT_OTHER)
Admission: EM | Admit: 2021-04-20 | Discharge: 2021-04-25 | DRG: 871 | Disposition: A | Discharge status: Home / Self Care | Payer: BC Managed Care – PPO | Attending: Internal Medicine | Admitting: Internal Medicine

## 2021-04-20 ENCOUNTER — Emergency Department (HOSPITAL_BASED_OUTPATIENT_CLINIC_OR_DEPARTMENT_OTHER): Payer: BC Managed Care – PPO

## 2021-04-20 DIAGNOSIS — F419 Anxiety disorder, unspecified: Secondary | ICD-10-CM | POA: Diagnosis not present

## 2021-04-20 DIAGNOSIS — E1142 Type 2 diabetes mellitus with diabetic polyneuropathy: Secondary | ICD-10-CM | POA: Diagnosis present

## 2021-04-20 DIAGNOSIS — H409 Unspecified glaucoma: Secondary | ICD-10-CM | POA: Diagnosis not present

## 2021-04-20 DIAGNOSIS — Z79899 Other long term (current) drug therapy: Secondary | ICD-10-CM

## 2021-04-20 DIAGNOSIS — Z8616 Personal history of COVID-19: Secondary | ICD-10-CM

## 2021-04-20 DIAGNOSIS — A419 Sepsis, unspecified organism: Principal | ICD-10-CM | POA: Diagnosis present

## 2021-04-20 DIAGNOSIS — J189 Pneumonia, unspecified organism: Secondary | ICD-10-CM | POA: Diagnosis not present

## 2021-04-20 DIAGNOSIS — J9601 Acute respiratory failure with hypoxia: Secondary | ICD-10-CM | POA: Diagnosis not present

## 2021-04-20 DIAGNOSIS — F17200 Nicotine dependence, unspecified, uncomplicated: Secondary | ICD-10-CM | POA: Diagnosis present

## 2021-04-20 DIAGNOSIS — E871 Hypo-osmolality and hyponatremia: Secondary | ICD-10-CM | POA: Diagnosis not present

## 2021-04-20 DIAGNOSIS — R6520 Severe sepsis without septic shock: Secondary | ICD-10-CM | POA: Diagnosis not present

## 2021-04-20 DIAGNOSIS — Z23 Encounter for immunization: Secondary | ICD-10-CM | POA: Diagnosis not present

## 2021-04-20 DIAGNOSIS — Z9862 Peripheral vascular angioplasty status: Secondary | ICD-10-CM

## 2021-04-20 DIAGNOSIS — E11649 Type 2 diabetes mellitus with hypoglycemia without coma: Secondary | ICD-10-CM | POA: Diagnosis not present

## 2021-04-20 DIAGNOSIS — D529 Folate deficiency anemia, unspecified: Secondary | ICD-10-CM | POA: Diagnosis not present

## 2021-04-20 DIAGNOSIS — E1165 Type 2 diabetes mellitus with hyperglycemia: Secondary | ICD-10-CM

## 2021-04-20 DIAGNOSIS — F32A Depression, unspecified: Secondary | ICD-10-CM | POA: Diagnosis not present

## 2021-04-20 DIAGNOSIS — E039 Hypothyroidism, unspecified: Secondary | ICD-10-CM | POA: Diagnosis not present

## 2021-04-20 DIAGNOSIS — E1151 Type 2 diabetes mellitus with diabetic peripheral angiopathy without gangrene: Secondary | ICD-10-CM | POA: Diagnosis not present

## 2021-04-20 DIAGNOSIS — N179 Acute kidney failure, unspecified: Secondary | ICD-10-CM | POA: Diagnosis not present

## 2021-04-20 DIAGNOSIS — J440 Chronic obstructive pulmonary disease with acute lower respiratory infection: Secondary | ICD-10-CM | POA: Diagnosis not present

## 2021-04-20 DIAGNOSIS — R7989 Other specified abnormal findings of blood chemistry: Secondary | ICD-10-CM | POA: Diagnosis present

## 2021-04-20 DIAGNOSIS — D509 Iron deficiency anemia, unspecified: Secondary | ICD-10-CM | POA: Diagnosis not present

## 2021-04-20 DIAGNOSIS — I10 Essential (primary) hypertension: Secondary | ICD-10-CM | POA: Diagnosis present

## 2021-04-20 DIAGNOSIS — Z7982 Long term (current) use of aspirin: Secondary | ICD-10-CM

## 2021-04-20 DIAGNOSIS — Z20822 Contact with and (suspected) exposure to covid-19: Secondary | ICD-10-CM | POA: Diagnosis present

## 2021-04-20 DIAGNOSIS — E1140 Type 2 diabetes mellitus with diabetic neuropathy, unspecified: Secondary | ICD-10-CM | POA: Diagnosis not present

## 2021-04-20 DIAGNOSIS — I70203 Unspecified atherosclerosis of native arteries of extremities, bilateral legs: Secondary | ICD-10-CM | POA: Diagnosis present

## 2021-04-20 DIAGNOSIS — Z794 Long term (current) use of insulin: Secondary | ICD-10-CM

## 2021-04-20 DIAGNOSIS — R0602 Shortness of breath: Secondary | ICD-10-CM | POA: Diagnosis present

## 2021-04-20 DIAGNOSIS — Z79890 Hormone replacement therapy (postmenopausal): Secondary | ICD-10-CM

## 2021-04-20 HISTORY — DX: Peripheral vascular disease, unspecified: I73.9

## 2021-04-20 LAB — COMPREHENSIVE METABOLIC PANEL
ALT: 10 U/L (ref 0–44)
AST: 18 U/L (ref 15–41)
Albumin: 3.4 g/dL — ABNORMAL LOW (ref 3.5–5.0)
Alkaline Phosphatase: 122 U/L (ref 38–126)
Anion gap: 11 (ref 5–15)
BUN: 21 mg/dL (ref 8–23)
CO2: 14 mmol/L — ABNORMAL LOW (ref 22–32)
Calcium: 8.4 mg/dL — ABNORMAL LOW (ref 8.9–10.3)
Chloride: 98 mmol/L (ref 98–111)
Creatinine, Ser: 1.5 mg/dL — ABNORMAL HIGH (ref 0.44–1.00)
GFR, Estimated: 39 mL/min — ABNORMAL LOW (ref >60)
Glucose, Bld: 228 mg/dL — ABNORMAL HIGH (ref 70–99)
Potassium: 3.9 mmol/L (ref 3.5–5.1)
Sodium: 123 mmol/L — ABNORMAL LOW (ref 135–145)
Total Bilirubin: 0.2 mg/dL — ABNORMAL LOW (ref 0.3–1.2)
Total Protein: 7.8 g/dL (ref 6.5–8.1)

## 2021-04-20 LAB — CBC WITH DIFFERENTIAL/PLATELET
Abs Immature Granulocytes: 0.14 10*3/uL — ABNORMAL HIGH (ref 0.00–0.07)
Basophils Absolute: 0 10*3/uL (ref 0.0–0.1)
Basophils Relative: 0 %
Eosinophils Absolute: 0 10*3/uL (ref 0.0–0.5)
Eosinophils Relative: 0 %
HCT: 28.1 % — ABNORMAL LOW (ref 36.0–46.0)
Hemoglobin: 9.4 g/dL — ABNORMAL LOW (ref 12.0–15.0)
Immature Granulocytes: 1 %
Lymphocytes Relative: 7 %
Lymphs Abs: 1.1 10*3/uL (ref 0.7–4.0)
MCH: 29.9 pg (ref 26.0–34.0)
MCHC: 33.5 g/dL (ref 30.0–36.0)
MCV: 89.5 fL (ref 80.0–100.0)
Monocytes Absolute: 1 10*3/uL (ref 0.1–1.0)
Monocytes Relative: 7 %
Neutro Abs: 13.1 10*3/uL — ABNORMAL HIGH (ref 1.7–7.7)
Neutrophils Relative %: 85 %
Platelets: 431 10*3/uL — ABNORMAL HIGH (ref 150–400)
RBC: 3.14 MIL/uL — ABNORMAL LOW (ref 3.87–5.11)
RDW: 13.8 % (ref 11.5–15.5)
WBC: 15.4 10*3/uL — ABNORMAL HIGH (ref 4.0–10.5)
nRBC: 0 % (ref 0.0–0.2)

## 2021-04-20 LAB — BASIC METABOLIC PANEL
Anion gap: 8 (ref 5–15)
BUN: 18 mg/dL (ref 8–23)
CO2: 14 mmol/L — ABNORMAL LOW (ref 22–32)
Calcium: 7.6 mg/dL — ABNORMAL LOW (ref 8.9–10.3)
Chloride: 105 mmol/L (ref 98–111)
Creatinine, Ser: 1.43 mg/dL — ABNORMAL HIGH (ref 0.44–1.00)
GFR, Estimated: 41 mL/min — ABNORMAL LOW (ref >60)
Glucose, Bld: 159 mg/dL — ABNORMAL HIGH (ref 70–99)
Potassium: 3.9 mmol/L (ref 3.5–5.1)
Sodium: 127 mmol/L — ABNORMAL LOW (ref 135–145)

## 2021-04-20 LAB — I-STAT VENOUS BLOOD GAS, ED
Acid-base deficit: 12 mmol/L — ABNORMAL HIGH (ref 0.0–2.0)
Bicarbonate: 13.9 mmol/L — ABNORMAL LOW (ref 20.0–28.0)
Calcium, Ion: 1.21 mmol/L (ref 1.15–1.40)
HCT: 28 % — ABNORMAL LOW (ref 36.0–46.0)
Hemoglobin: 9.5 g/dL — ABNORMAL LOW (ref 12.0–15.0)
O2 Saturation: 65 %
Patient temperature: 100.2
Potassium: 4 mmol/L (ref 3.5–5.1)
Sodium: 129 mmol/L — ABNORMAL LOW (ref 135–145)
TCO2: 15 mmol/L — ABNORMAL LOW (ref 22–32)
pCO2, Ven: 32 mmHg — ABNORMAL LOW (ref 44.0–60.0)
pH, Ven: 7.249 — ABNORMAL LOW (ref 7.250–7.430)
pO2, Ven: 41 mmHg (ref 32.0–45.0)

## 2021-04-20 LAB — TROPONIN I (HIGH SENSITIVITY): Troponin I (High Sensitivity): 5 ng/L (ref <18)

## 2021-04-20 LAB — BRAIN NATRIURETIC PEPTIDE: B Natriuretic Peptide: 165.1 pg/mL — ABNORMAL HIGH (ref 0.0–100.0)

## 2021-04-20 LAB — RESP PANEL BY RT-PCR (FLU A&B, COVID) ARPGX2
Influenza A by PCR: NEGATIVE
Influenza B by PCR: NEGATIVE
SARS Coronavirus 2 by RT PCR: NEGATIVE

## 2021-04-20 LAB — LACTIC ACID, PLASMA
Lactic Acid, Venous: 1.5 mmol/L (ref 0.5–1.9)
Lactic Acid, Venous: 1.2 mmol/L (ref 0.5–1.9)

## 2021-04-20 LAB — GLUCOSE, CAPILLARY: Glucose-Capillary: 125 mg/dL — ABNORMAL HIGH (ref 70–99)

## 2021-04-20 MED ORDER — INFLUENZA VAC SPLIT QUAD 0.5 ML IM SUSY
0.5000 mL | PREFILLED_SYRINGE | INTRAMUSCULAR | Status: AC
  Administered 2021-04-21: 0.5 mL via INTRAMUSCULAR
  Filled 2021-04-20: qty 0.5

## 2021-04-20 MED ORDER — INSULIN ASPART 100 UNIT/ML IJ SOLN
0.0000 [IU] | Freq: Three times a day (TID) | INTRAMUSCULAR | Status: DC
  Administered 2021-04-21: 5 [IU] via SUBCUTANEOUS
  Administered 2021-04-21 – 2021-04-22 (×5): 3 [IU] via SUBCUTANEOUS
  Administered 2021-04-23: 7 [IU] via SUBCUTANEOUS
  Administered 2021-04-23: 3 [IU] via SUBCUTANEOUS
  Administered 2021-04-24 (×2): 5 [IU] via SUBCUTANEOUS
  Administered 2021-04-25: 2 [IU] via SUBCUTANEOUS
  Administered 2021-04-25: 3 [IU] via SUBCUTANEOUS

## 2021-04-20 MED ORDER — ACETAMINOPHEN 325 MG PO TABS
650.0000 mg | ORAL_TABLET | Freq: Four times a day (QID) | ORAL | Status: DC | PRN
  Administered 2021-04-21: 650 mg via ORAL
  Filled 2021-04-20: qty 2

## 2021-04-20 MED ORDER — SERTRALINE HCL 100 MG PO TABS
100.0000 mg | ORAL_TABLET | Freq: Every day | ORAL | Status: DC
  Administered 2021-04-21 – 2021-04-25 (×5): 100 mg via ORAL
  Filled 2021-04-20 (×6): qty 1

## 2021-04-20 MED ORDER — SODIUM CHLORIDE 0.9 % IV SOLN
1.0000 g | INTRAVENOUS | Status: DC
  Administered 2021-04-21 – 2021-04-24 (×4): 1 g via INTRAVENOUS
  Filled 2021-04-20 (×5): qty 10

## 2021-04-20 MED ORDER — NICOTINE 21 MG/24HR TD PT24: 21.0000 mg | MEDICATED_PATCH | Freq: Every day | TRANSDERMAL | Status: DC | PRN

## 2021-04-20 MED ORDER — SODIUM CHLORIDE 0.9 % IV SOLN
500.0000 mg | Freq: Once | INTRAVENOUS | Status: AC
  Administered 2021-04-20: 500 mg via INTRAVENOUS
  Filled 2021-04-20: qty 500

## 2021-04-20 MED ORDER — SODIUM CHLORIDE 0.9 % IV SOLN
1.0000 g | Freq: Once | INTRAVENOUS | Status: AC
  Administered 2021-04-20: 1 g via INTRAVENOUS
  Filled 2021-04-20: qty 10

## 2021-04-20 MED ORDER — ASPIRIN 81 MG PO CHEW
81.0000 mg | CHEWABLE_TABLET | Freq: Every day | ORAL | Status: DC
  Administered 2021-04-21: 81 mg via ORAL
  Filled 2021-04-20: qty 1

## 2021-04-20 MED ORDER — LACTATED RINGERS IV SOLN: INTRAVENOUS | Status: AC

## 2021-04-20 MED ORDER — METHYLPREDNISOLONE SODIUM SUCC 40 MG IJ SOLR
80.0000 mg | Freq: Two times a day (BID) | INTRAMUSCULAR | Status: DC
  Administered 2021-04-20 – 2021-04-21 (×2): 80 mg via INTRAVENOUS
  Filled 2021-04-20 (×2): qty 2

## 2021-04-20 MED ORDER — ACETAMINOPHEN 650 MG RE SUPP: 650.0000 mg | Freq: Four times a day (QID) | RECTAL | Status: DC | PRN

## 2021-04-20 MED ORDER — SODIUM CHLORIDE 0.9 % IV SOLN
500.0000 mg | INTRAVENOUS | Status: DC
  Administered 2021-04-21: 500 mg via INTRAVENOUS
  Filled 2021-04-20 (×2): qty 500

## 2021-04-20 MED ORDER — MELATONIN 5 MG PO TABS
5.0000 mg | ORAL_TABLET | Freq: Every evening | ORAL | Status: DC | PRN
  Administered 2021-04-21 – 2021-04-22 (×2): 5 mg via ORAL
  Filled 2021-04-20 (×2): qty 1

## 2021-04-20 MED ORDER — ACETAMINOPHEN 325 MG PO TABS
650.0000 mg | ORAL_TABLET | Freq: Once | ORAL | Status: AC
  Administered 2021-04-20: 650 mg via ORAL
  Filled 2021-04-20: qty 2

## 2021-04-20 MED ORDER — THYROID 97.5 MG PO TABS: 1.0000 | ORAL_TABLET | Freq: Every day | ORAL | Status: DC

## 2021-04-20 MED ORDER — ATORVASTATIN CALCIUM 10 MG PO TABS
20.0000 mg | ORAL_TABLET | Freq: Every day | ORAL | Status: DC
  Administered 2021-04-21: 20 mg via ORAL
  Filled 2021-04-20: qty 2

## 2021-04-20 MED ORDER — SODIUM CHLORIDE 0.9 % IV BOLUS
1000.0000 mL | Freq: Once | INTRAVENOUS | Status: AC
  Administered 2021-04-20: 1000 mL via INTRAVENOUS

## 2021-04-20 MED ORDER — ALBUTEROL SULFATE (2.5 MG/3ML) 0.083% IN NEBU
2.5000 mg | INHALATION_SOLUTION | RESPIRATORY_TRACT | Status: DC | PRN
  Administered 2021-04-20 – 2021-04-24 (×4): 2.5 mg via RESPIRATORY_TRACT
  Filled 2021-04-20 (×4): qty 3

## 2021-04-20 NOTE — ED Notes (Signed)
Walked patient on pulse ox with SATs starting at 96% sitting in the waiting room and then were 94% once getting to room and sitting in bed. Patient with tachypnea and labored breathing. Bilateral coarse crackles noted.

## 2021-04-20 NOTE — ED Triage Notes (Signed)
Pt reports shortness of breath for the past 4 days. Pt only able to speak in short phrases. Pt also reports sore throat and cough.

## 2021-04-20 NOTE — ED Provider Notes (Signed)
[facility] DEPARTMENT Provider Note   CSN: [id number] Arrival date & time: 04/20/21  1618     History Chief Complaint  Patient presents with  . Shortness of Breath    Michelle Nicholson is a 60 y.o. female history of diabetes, here presenting with shortness of breath and chills.  Patient states that for the last 3 to 4 days, she has progressive worsening shortness of breath.  She started with a sore throat and she has not been able to eat that much.  Patient states that she had COVID in 2019 and had received 3 shots already.  Denies any sick contacts with COVID.  The history is provided by the patient.      Past Medical History:  Diagnosis Date  . Diabetes mellitus   . Hypertension   . Neuropathy   . Pancreas disorder   . Peripheral vascular disease [facility])     Patient Active Problem List   Diagnosis Date Noted  . Controlled type 2 diabetes mellitus with diabetic neuropathy, with long-term current use of insulin ([place]) 12/09/2015  . Essential hypertension 12/09/2015  . Meniere syndrome 12/09/2015    Past Surgical History:  Procedure Laterality Date  . CARDIAC CATHETERIZATION       OB History   No obstetric history on file.     History reviewed. No pertinent family history.  Social History   Tobacco Use  . Smoking status: Every Day  . Smokeless tobacco: Never  Substance Use Topics  . Alcohol use: No  . Drug use: No    Home Medications Prior to Admission medications   Medication Sig Start Date End Date Taking? Authorizing Provider  aspirin 81 MG chewable tablet Chew 81 mg by mouth daily.    [provider]  atorvastatin (LIPITOR) 20 MG tablet Take 20 mg by mouth daily.    [provider]  cephALEXin (KEFLEX) 500 MG capsule Take 1 capsule (500 mg total) by mouth 2 (two) times daily. 04/03/19   Montine Circle, PA-C  Cholecalciferol (VITAMIN D PO) Take 5,000 Units by mouth 2 (two) times daily.     [provider]   Coenzyme Q10 (COQ10) 100 MG CAPS Take 100 mg by mouth daily.    [provider]  DHEA 10 MG TABS Take 15 mg by mouth daily.    [provider]  dorzolamide-timolol (COSOPT) 22.3-6.8 MG/ML ophthalmic solution Place 1 drop into both eyes 2 (two) times daily.    [provider]  gabapentin (NEURONTIN) 300 MG capsule Take 300 mg by mouth 2 (two) times daily.    [provider]  HYDROcodone-acetaminophen (NORCO/VICODIN) 5-325 MG tablet Take 1 tablet by mouth every 6 (six) hours as needed for severe pain. 04/06/16   Joy, Shawn C, PA-C  hydroxypropyl methylcellulose / hypromellose (ISOPTO TEARS / GONIOVISC) 2.5 % ophthalmic solution Place 1 drop into both eyes 2 (two) times daily.    [provider]  insulin aspart (NOVOLOG FLEXPEN) 100 UNIT/ML FlexPen Inject 4 Units into the skin 3 (three) times daily with meals. Plus sliding scale as needed for blood sugar levels    [provider]  Insulin Degludec (TRESIBA FLEXTOUCH) 100 UNIT/ML SOPN Inject 7 Units into the skin daily. 12/10/15   Nita Sells, MD  losartan (COZAAR) 50 MG tablet Take 50 mg by mouth daily.    [provider]  meclizine (ANTIVERT) 12.5 MG tablet Take 1 tablet (12.5 mg total) by mouth 3 (three) times daily as needed for  dizziness. 12/10/15   Nita Sells, MD  Melatonin 5 MG TABS Take 5 mg by mouth at bedtime.    [provider]  Menaquinone-7 (VITAMIN K2) 100 MCG CAPS Take 150 mg by mouth daily.    [provider]  methocarbamol (ROBAXIN) 500 MG tablet Take 1 tablet (500 mg total) by mouth 2 (two) times daily. 04/06/16   Joy, Shawn C, PA-C  metoCLOPramide (REGLAN) 10 MG tablet Take 1 tablet (10 mg total) by mouth every 6 (six) hours. 04/06/16   Joy, Shawn C, PA-C  Multiple Vitamin (MULTIVITAMIN) capsule Take 1 capsule by mouth daily.    [provider]  naproxen (NAPROSYN) 500 MG tablet Take 1 tablet (500 mg total) by mouth 2 (two) times  daily. 04/06/16   Joy, Shawn C, PA-C  Omega-3 Fatty Acids (FISH OIL) 1000 MG CAPS Take 1,000 mg by mouth daily.    [provider]  ondansetron (ZOFRAN ODT) 4 MG disintegrating tablet Take 1 tablet (4 mg total) by mouth every 8 (eight) hours as needed. 04/03/19   Montine Circle, PA-C  OVER THE COUNTER MEDICATION Take 75 mg by mouth daily. Pregnenolone    [provider]  Pancrelipase, Lip-Prot-Amyl, (CREON) 24000 UNITS CPEP Take 24,000 Units by mouth 2 (two) times daily.     [provider]  sertraline (ZOLOFT) 100 MG tablet Take 100 mg by mouth daily.    [provider]  Thyroid (NATURE-THROID) 97.5 MG TABS Take 1 tablet by mouth daily.    [provider]  Turmeric 500 MG CAPS Take 500 mg by mouth daily.    [provider]    Allergies    Patient has no known allergies.  Review of Systems   Review of Systems  Respiratory:  Positive for shortness of breath.   All other systems reviewed and are negative.  Physical Exam Updated Vital Signs BP (!) 148/50   Pulse 81   Temp 98.4 F (36.9 C) (Oral)   Resp (!) 32   Ht 5\' 9"  (1.753 m)   Wt 68.5 kg   SpO2 98%   BMI 22.30 kg/m   Physical Exam Vitals and nursing note reviewed.  HENT:     Head: Normocephalic.     Mouth/Throat:     Comments: Mucous membrane is dry.  No tonsillar erythema or exudate Eyes:     Extraocular Movements: Extraocular movements intact.     Pupils: Pupils are equal, round, and reactive to light.  Cardiovascular:     Rate and Rhythm: Normal rate and regular rhythm.  Pulmonary:     Comments: Crackles bilateral bases Abdominal:     General: Bowel sounds are normal.     Palpations: Abdomen is soft.  Musculoskeletal:        General: Normal range of motion.  Skin:    General: Skin is warm.     Capillary Refill: Capillary refill takes less than 2 seconds.  Neurological:     General: No focal deficit present.     Mental Status: She is alert and oriented to  person, place, and time.  Psychiatric:        Mood and Affect: Mood normal.        Behavior: Behavior normal.    ED Results / Procedures / Treatments   Labs (all labs ordered are listed, but only abnormal results are displayed) Labs Reviewed  CBC WITH DIFFERENTIAL/PLATELET - Abnormal; Notable for the following components:      Result Value  WBC 15.4 (*)    RBC 3.14 (*)    Hemoglobin 9.4 (*)    HCT 28.1 (*)    Platelets 431 (*)    Neutro Abs 13.1 (*)    Abs Immature Granulocytes 0.14 (*)    All other components within normal limits  COMPREHENSIVE METABOLIC PANEL - Abnormal; Notable for the following components:   Sodium 123 (*)    CO2 14 (*)    Glucose, Bld 228 (*)    Creatinine, Ser 1.50 (*)    Calcium 8.4 (*)    Albumin 3.4 (*)    Total Bilirubin 0.2 (*)    GFR, Estimated 39 (*)    All other components within normal limits  BRAIN NATRIURETIC PEPTIDE - Abnormal; Notable for the following components:   B Natriuretic Peptide 165.1 (*)    All other components within normal limits  I-STAT VENOUS BLOOD GAS, ED - Abnormal; Notable for the following components:   pH, Ven 7.249 (*)    pCO2, Ven 32.0 (*)    Bicarbonate 13.9 (*)    TCO2 15 (*)    Acid-base deficit 12.0 (*)    Sodium 129 (*)    HCT 28.0 (*)    Hemoglobin 9.5 (*)    All other components within normal limits  RESP PANEL BY RT-PCR (FLU A&B, COVID) ARPGX2  CULTURE, BLOOD (ROUTINE X 2)  CULTURE, BLOOD (ROUTINE X 2)  LACTIC ACID, PLASMA  LACTIC ACID, PLASMA  TROPONIN I (HIGH SENSITIVITY)    EKG EKG Interpretation  Date/Time:  Saturday April 20 2021 16:30:20 EDT Ventricular Rate:  95 PR Interval:  153 QRS Duration: 99 QT Interval:  344 QTC Calculation: 433 R Axis:   0 Text Interpretation: poor baseline, grossly unchanged Confirmed by Wandra Arthurs [phone]) on 04/20/2021 4:51:52 PM  Radiology DG Chest Port 1 View  Result Date: 04/20/2021 CLINICAL DATA:  SOB x 4 days, sore throat, cough, bilat coarse  crackles. Hx stroke. Hx pna. EXAM: PORTABLE CHEST 1 VIEW COMPARISON:  Chest x-ray 05/07/2020, CT chest 05/07/2020, chest x-ray 08/12/2019 FINDINGS: The heart and mediastinal contours are unchanged. Aortic calcification. Hazy patchy airspace opacities of bilateral lower mid lung zones. Slightly increased interstitial markings. No pleural effusion. No pneumothorax. No acute osseous abnormality. IMPRESSION: Hazy patchy airspace opacities of bilateral lower mid lung zones. Findings likely represent infection/inflammation. Followup PA and lateral chest X-ray is recommended in 3-4 weeks following therapy to ensure resolution. Electronically Signed   By: Iven Finn M.D.   On: 04/20/2021 17:30    Procedures Procedures   CRITICAL CARE Performed by: Wandra Arthurs   Total critical care time: 30 minutes  Critical care time was exclusive of separately billable procedures and treating other patients.  Critical care was necessary to treat or prevent imminent or life-threatening deterioration.  Critical care was time spent personally by me on the following activities: development of treatment plan with patient and/or surrogate as well as nursing, discussions with consultants, evaluation of patient's response to treatment, examination of patient, obtaining history from patient or surrogate, ordering and performing treatments and interventions, ordering and review of laboratory studies, ordering and review of radiographic studies, pulse oximetry and re-evaluation of patient's condition.   Medications Ordered in ED Medications  azithromycin (ZITHROMAX) 500 mg in sodium chloride 0.9 % 250 mL IVPB (500 mg Intravenous New Bag/Given 04/20/21 1746)  acetaminophen (TYLENOL) tablet 650 mg (650 mg Oral Given 04/20/21 1725)  sodium chloride 0.9 % bolus 1,000 mL (1,000 mLs Intravenous New  Bag/Given 04/20/21 1731)  cefTRIAXone (ROCEPHIN) 1 g in sodium chloride 0.9 % 100 mL IVPB (1 g Intravenous New Bag/Given 04/20/21 1740)     ED Course  I have reviewed the triage vital signs and the nursing notes.  Pertinent labs & imaging results that were available during my care of the patient were reviewed by me and considered in my medical decision making (see chart for details).    MDM Rules/Calculators/A&P                           Michelle Nicholson is a 60 y.o. female who presented with shortness of breath and cough.  Patient is very tachypneic.  Patient was put on 4 L nasal cannula for comfort.  Concern for either COVID versus pneumonia versus COPD.  Plan to get CBC and CMP and VBG and chest x-ray and lactate and cultures  6:20 PM Patient's VBG showed metabolic acidosis.  Patient's bicarb is 15 and her sodium is 123.  Patient also has acute renal failure with creatinine 1.5.  Patient's anion gap is normal.  Patient adamantly denies any overdose.  She has not been eating and drinking much so likely has non-anion gap metabolic acidosis from dehydration.  Chest x-ray showed pneumonia.  Given IV antibiotics.  We will admit for dehydration, pneumonia, hyponatremia  Final Clinical Impression(s) / ED Diagnoses Final diagnoses:  None    Rx / DC Orders ED Discharge Orders     None        Drenda Freeze, MD 04/20/21 1821

## 2021-04-20 NOTE — ED Notes (Signed)
X-ray at bedside

## 2021-04-20 NOTE — ED Notes (Signed)
Placed on 4L [place] due to work of breathing.

## 2021-04-20 NOTE — H&P (Signed)
History and Physical    PLEASE NOTE THAT DRAGON DICTATION SOFTWARE WAS USED IN THE CONSTRUCTION OF THIS NOTE.   Michelle Nicholson QPY:[id number] DOB: December 28, 1960 DOA: 04/20/2021  PCP: [facility], [facility] Patient coming from: home   I have personally briefly reviewed patient's old medical records in [place]  Chief Complaint: Shortness of breath  HPI: Michelle Nicholson is a 60 y.o. female with medical history significant for type 2 diabetes mellitus, hypertension, acquired hypothyroidism, chronic hyponatremia with baseline serum sodium 1 27-1 31, chronic anemia with baseline hemoglobin 10-11.5, who is admitted to [facility] on 04/20/2021 by way of transfer from [place] emergency department with severe sepsis due to bilateral pneumonia after presenting from home to the latter facility complaining of shortness of breath.  The patient reports 3 to 4 days of progressive shortness of breath associated with new onset nonproductive cough over that timeframe.  She also notes associated subjective fever and chills in the absence of full body rigors or generalized myalgias.  Denies any associated orthopnea, PND, or new onset peripheral edema.  No recent chest pain, diaphoresis, palpitations, nausea, vomiting, presyncope, or syncope.  Not associated with any wheezing, hemoptysis, new lower extremity erythema, or calf tenderness. Denies any recent trauma, travel, surgical procedures, or periods of prolonged diminished ambulatory status. No recent melena or hematochezia.  Denies any associated headache or neck stiffness, but acknowledges a mild sore throat associated with the above timeframe.  Not associate with any recent abdominal pain, diarrhea, rash.  Denies any known recent COVID-19 exposures.Denies dysuria, gross hematuria, or change in urinary urgency/frequency.   Denies any known chronic underlying pulmonary pathology, while acknowledging that she is a current smoker, having smoked  1 pack/day for over 20 years.  Denies any baseline supplemental oxygen requirements.  Per chart review, she has a documented history of chronic hyponatremia with associated baseline serum sodium range of 1 27-1 31.  In terms of her prior serum sodium values, most recent prior was noted to be 128 on 01/24/2021, preceded by 131 in October 2021, as well as 127 in September 2020.   ED Course:  Vital signs in the ED were notable for the following: Tetramex 100.2, heart rate 72-92, blood pressure 124/52 -147/80; respiratory rate 20-29, oxygen saturation initially noted to be 89% on room air, which is subsequently improved to 94 to 97% on 5 L nasal cannula.  Labs were notable for the following: VBG notable for 7.249/32.  CMP notable for the following: Sodium 123, which is corrected to approximately 125 and taking into account concomitant hyperglycemia, potassium 3.9, bicarbonate 14, anion gap 11, BUN 21, creatinine 1.50 relative to most recent prior value 0.95 in July 2022, glucose 228, and liver enzymes were found to be within normal limits.  BNP 165 without prior BNP data point available for point comparison.  High-sensitivity troponin I noted to be 5.  CBC notable for white cell count 15,400 with 85% neutrophils.  Hemoglobin 9.4 and associated with normocytic/normochromic findings as well as nonelevated RDW.  Initial lactate 1.5, with repeat value trending down to 1.2.  COVID-19/influenza PCR were checked in the ED today found to be negative.  Blood cultures x2 collected prior to initiation of IV antibiotics.  Imaging and additional notable ED work-up: EKG interpretation was limited by the presence of significant artifact, but within these confines, appears to demonstrate sinus rhythm with heart rate 95, normal intervals, and no overt evidence of T wave or ST changes.  Chest x-ray shows hazy airspace opacities in the bilateral lower lung fields suggestive of infection, in the absence of overt evidence of edema,  pleural effusion, or pneumothorax.  While in the ED, the following were administered: Acetaminophen 650 mg p.o. x1, azithromycin, Rocephin, and a 1 L normal saline bolus.  Subsequently, the patient was transferred to [facility] for further evaluation and management of severe sepsis due to bilateral pneumonia and associated acute hypoxic respiratory failure.     Review of Systems: As per HPI otherwise 10 point review of systems negative.   Past Medical History:  Diagnosis Date   Diabetes mellitus    Hypertension    Neuropathy    Pancreas disorder    Peripheral vascular disease ([place])     Past Surgical History:  Procedure Laterality Date   CARDIAC CATHETERIZATION      Social History:  reports that she has been smoking. She has never used smokeless tobacco. She reports that she does not drink alcohol and does not use drugs.   No Known Allergies  History reviewed. No pertinent family history.  Family history reviewed and not pertinent    Prior to Admission medications   Medication Sig Start Date End Date Taking? Authorizing Provider  aspirin 81 MG chewable tablet Chew 81 mg by mouth daily.   Yes [provider]  atorvastatin (LIPITOR) 20 MG tablet Take 20 mg by mouth daily.   Yes [provider]  Cholecalciferol (VITAMIN D PO) Take 5,000 Units by mouth 2 (two) times daily.    Yes [provider]  Coenzyme Q10 (COQ10) 100 MG CAPS Take 100 mg by mouth daily.   Yes [provider]  insulin aspart (NOVOLOG) 100 UNIT/ML FlexPen Inject 4 Units into the skin 3 (three) times daily with meals. Plus sliding scale as needed for blood sugar levels   Yes [provider]  Insulin Degludec (TRESIBA FLEXTOUCH) 100 UNIT/ML SOPN Inject 7 Units into the skin daily. 12/10/15  Yes Nita Sells, MD  losartan (COZAAR) 50 MG tablet Take 50 mg by mouth daily.   Yes [provider]  Menaquinone-7 (VITAMIN K2) 100 MCG CAPS Take 150 mg by mouth  daily.   Yes [provider]  Multiple Vitamin (MULTIVITAMIN) capsule Take 1 capsule by mouth daily.   Yes [provider]  Omega-3 Fatty Acids (FISH OIL) 1000 MG CAPS Take 1,000 mg by mouth daily.   Yes [provider]  Pancrelipase, Lip-Prot-Amyl, 24000-76000 units CPEP Take 24,000 Units by mouth 2 (two) times daily.    Yes [provider]  pregabalin (LYRICA) 75 MG capsule Take 75 mg by mouth 2 (two) times daily.   Yes [provider]  sertraline (ZOLOFT) 100 MG tablet Take 100 mg by mouth daily.   Yes [provider]  Thyroid 97.5 MG TABS Take 1 tablet by mouth daily.   Yes [provider]  cephALEXin (KEFLEX) 500 MG capsule Take 1 capsule (500 mg total) by mouth 2 (two) times daily. 04/03/19   Montine Circle, PA-C  DHEA 10 MG TABS Take 15 mg by mouth daily.    [provider]  dorzolamide-timolol (COSOPT) 22.3-6.8 MG/ML ophthalmic solution Place 1 drop into both eyes 2 (two) times daily.    [provider]  gabapentin (NEURONTIN) 300 MG capsule Take 300 mg by mouth 2 (two) times daily.    [provider]  HYDROcodone-acetaminophen (NORCO/VICODIN) 5-325 MG tablet Take 1 tablet by mouth every 6 (six) hours as needed for  severe pain. 04/06/16   Joy, Shawn C, PA-C  hydroxypropyl methylcellulose / hypromellose (ISOPTO TEARS / GONIOVISC) 2.5 % ophthalmic solution Place 1 drop into both eyes 2 (two) times daily.    [provider]  meclizine (ANTIVERT) 12.5 MG tablet Take 1 tablet (12.5 mg total) by mouth 3 (three) times daily as needed for dizziness. 12/10/15   Nita Sells, MD  Melatonin 5 MG TABS Take 5 mg by mouth at bedtime.    [provider]  methocarbamol (ROBAXIN) 500 MG tablet Take 1 tablet (500 mg total) by mouth 2 (two) times daily. 04/06/16   Joy, Shawn C, PA-C  metoCLOPramide (REGLAN) 10 MG tablet Take 1 tablet (10 mg total) by mouth every 6 (six) hours. 04/06/16   Joy, Shawn  C, PA-C  naproxen (NAPROSYN) 500 MG tablet Take 1 tablet (500 mg total) by mouth 2 (two) times daily. 04/06/16   Joy, Shawn C, PA-C  ondansetron (ZOFRAN ODT) 4 MG disintegrating tablet Take 1 tablet (4 mg total) by mouth every 8 (eight) hours as needed. 04/03/19   Montine Circle, PA-C  OVER THE COUNTER MEDICATION Take 75 mg by mouth daily. Pregnenolone    [provider]  Turmeric 500 MG CAPS Take 500 mg by mouth daily.    [provider]     Objective    Physical Exam: Vitals:   04/20/21 1845 04/20/21 1900 04/20/21 1930 04/20/21 2042  BP:   (!) 124/50 (!) 132/52  Pulse: 78 73 72 74  Resp: (!) 35 (!) 22 (!) 27 20  Temp:    98.1 F (36.7 C)  TempSrc:    Oral  SpO2: 96% 93% 94% 97%  Weight:      Height:        General: appears to be stated age; alert, oriented; mildly increased work of breathing noted Skin: warm, dry, no rash Head:  AT/[place] Mouth:  Oral mucosa membranes appear dry, normal dentition Neck: supple; trachea midline Heart:  RRR; did not appreciate any M/R/G Lungs: Bibasilar crackles noted, but otherwise CTAB, did not appreciate any wheezes, or rhonchi Abdomen: + BS; soft, ND, NT Vascular: 2+ pedal pulses b/l; 2+ radial pulses b/l Extremities: no peripheral edema, no muscle wasting Neuro: strength and sensation intact in upper and lower extremities b/l   Labs on Admission: I have personally reviewed following labs and imaging studies  CBC: Recent Labs  Lab 04/20/21 1635 04/20/21 1648  WBC 15.4*  --   NEUTROABS 13.1*  --   HGB 9.4* 9.5*  HCT 28.1* 28.0*  MCV 89.5  --   PLT 431*  --    Basic Metabolic Panel: Recent Labs  Lab 04/20/21 1635 04/20/21 1648 04/20/21 1948  NA 123* 129* 127*  K 3.9 4.0 3.9  CL 98  --  105  CO2 14*  --  14*  GLUCOSE 228*  --  159*  BUN 21  --  18  CREATININE 1.50*  --  1.43*  CALCIUM 8.4*  --  7.6*   GFR: Estimated Creatinine Clearance: 42.6 mL/min (A) (by C-G formula based on SCr of 1.43 mg/dL  (H)). Liver Function Tests: Recent Labs  Lab 04/20/21 1635  AST 18  ALT 10  ALKPHOS 122  BILITOT 0.2*  PROT 7.8  ALBUMIN 3.4*   No results for input(s): LIPASE, AMYLASE in the last 168 hours. No results for input(s): AMMONIA in the last 168 hours. Coagulation Profile: No results for input(s): INR, PROTIME in the last 168 hours. Cardiac  Enzymes: No results for input(s): CKTOTAL, CKMB, CKMBINDEX, TROPONINI in the last 168 hours. BNP (last 3 results) No results for input(s): PROBNP in the last 8760 hours. HbA1C: No results for input(s): HGBA1C in the last 72 hours. CBG: Recent Labs  Lab 04/20/21 2051  GLUCAP 125*   Lipid Profile: No results for input(s): CHOL, HDL, LDLCALC, TRIG, CHOLHDL, LDLDIRECT in the last 72 hours. Thyroid Function Tests: No results for input(s): TSH, T4TOTAL, FREET4, T3FREE, THYROIDAB in the last 72 hours. Anemia Panel: No results for input(s): VITAMINB12, FOLATE, FERRITIN, TIBC, IRON, RETICCTPCT in the last 72 hours. Urine analysis:    Component Value Date/Time   COLORURINE YELLOW 04/02/2019 2321   APPEARANCEUR HAZY (A) 04/02/2019 2321   LABSPEC 1.016 04/02/2019 2321   PHURINE 5.0 04/02/2019 2321   GLUCOSEU NEGATIVE 04/02/2019 2321   HGBUR NEGATIVE 04/02/2019 2321   BILIRUBINUR NEGATIVE 04/02/2019 2321   KETONESUR 5 (A) 04/02/2019 2321   PROTEINUR 100 (A) 04/02/2019 2321   UROBILINOGEN 1.0 12/29/2012 1628   NITRITE NEGATIVE 04/02/2019 2321   LEUKOCYTESUR SMALL (A) 04/02/2019 2321    Radiological Exams on Admission: DG Chest Port 1 View  Result Date: 04/20/2021 CLINICAL DATA:  SOB x 4 days, sore throat, cough, bilat coarse crackles. Hx stroke. Hx pna. EXAM: PORTABLE CHEST 1 VIEW COMPARISON:  Chest x-ray 05/07/2020, CT chest 05/07/2020, chest x-ray 08/12/2019 FINDINGS: The heart and mediastinal contours are unchanged. Aortic calcification. Hazy patchy airspace opacities of bilateral lower mid lung zones. Slightly increased interstitial  markings. No pleural effusion. No pneumothorax. No acute osseous abnormality. IMPRESSION: Hazy patchy airspace opacities of bilateral lower mid lung zones. Findings likely represent infection/inflammation. Followup PA and lateral chest X-ray is recommended in 3-4 weeks following therapy to ensure resolution. Electronically Signed   By: Iven Finn M.D.   On: 04/20/2021 17:30     EKG: Independently reviewed, with result as described above.    Assessment/Plan   Michelle Nicholson is a 60 y.o. female with medical history significant for type 2 diabetes mellitus, hypertension, acquired hypothyroidism, chronic hyponatremia with baseline serum sodium 1 27-1 31, chronic anemia with baseline hemoglobin 10-11.5, who is admitted to [facility] on 04/20/2021 by way of transfer from [place] emergency department with severe sepsis due to bilateral pneumonia after presenting from home to the latter facility complaining of shortness of breath.   Principal Problem:   Bilateral pneumonia Active Problems:   Controlled type 2 diabetes mellitus with diabetic neuropathy, with long-term current use of insulin (HCC)   Essential hypertension   Severe sepsis (HCC)   Shortness of breath   Acute respiratory failure with hypoxia (HCC)   Chronic hyponatremia   AKI (acute kidney injury) ([place])     #) Severe sepsis due to bilateral pneumonia: Diagnosis on the basis of 3 to 4 days of progressive shortness of breath associated with new onset nonproductive cough, subjective fever, chills, and presenting chest x-ray showing evidence of bilateral airspace opacities in the lower lung fields suggestive of infection, with COVID-19/influenza PCR found to be negative.  SIRS criteria met via presenting leukocytosis and tachypnea.  Criteria are met for patient sepsis to be considered severe in nature on the basis of concomitant acute hypoxic respiratory failure, as further detailed below as well as the evidence of  endorgan damage in the form of presenting acute kidney injury.  No evidence of hypotension, and lactate found to be nonelevated x2 values, as quantified above.  In the absence of lactic acid  level that is greater than or equal to 4.0, and in the absence of any associated hypotension, there are no indications for administration of a 30 mL/kg IVF bolus at this time.  Blood cultures x2 were collected at [facility] ED today prior to initiation of IV antibiotics.  No overt evidence of additional underlying infectious process at this time, although will check urinalysis to further evaluate.  Of note, while the patient denies any known history of COPD, suspect that there may be an element of obstructive lung disease given her report of long-term tobacco abuse.  Will initiate systemic corticosteroids accordingly, and monitor closely for clinical response.   Plan: Follow-up results of blood cultures x2 elected in the ED today.  Continue azithromycin and Rocephin.  Repeat CBC with differential in the morning.  Monitor on telemetry.  Monitor continuous pulse oximetry.  Add on's procalcitonin level.  Check strep pneumoniae urine antigen.  Mycoplasma antibodies, Legionella urine antigen.  Flutter valve and incentive incentive spirometry been ordered.  Blood gas.  Check urinalysis, as above.  As needed albuterol nebulizer.       #) Acute hypoxic respiratory failure: in the context of no known baseline supplemental oxygen requirements, presenting O2 sat noted to be in the high 80s, subsequently improving to 95 to 97% on 5 L nasal cannula. No known chronic underlying pulmonary conditions although there may be an element of undiagnosed obstructive lung disease in the setting of long smoking history, as further detailed above.  Presenting with hypoxia respiratory failure appears to be on the basis of bilateral pneumonia, as further detailed above, will noting that COVID-19/influenza PCR were found to be negative  when checked in the ED this evening.  Otherwise, chest x-ray shows no evidence of additional acute cardiopulmonary process, including no evidence of edema, pleural effusion, or pneumothorax.   ACS is felt to be less likely at this time in the absence of any recent chest pain and in the context of negative troponin and presenting EKG showing no evidence of acute ischemic process.  We will continue to trend serial troponin to further evaluate.  Clinically, presentation is felt to be less suggestive of acutely decompensated heart failure, including no evidence of edema on chest x-ray.  Of note, BNP is minimally elevated, without any prior data point available for point comparison.  Clinically, presentation appears less suggestive of acute pulmonary embolism.   Plan: further evaluation and management of presenting severe sepsis due to bilateral pneumonia, as above, including monitoring of continuous pulse oximetry with prn supplemental O2 to maintain O2 sats greater than or equal to 92%. monitor on telemetry. Check CMP and CBC in the morning. Check serum Mg and Phos levels.  Check blood gas.  Flutter valve and incentive spirometry.  Start systemic steroids, as further detailed above.  As needed albuterol nebulizer ordered.  Trend troponin.        #) Acute kidney injury: Presenting creatinine noted to be 1.50 relative to most recent prior value of 0.95 in July 2022.  Suspect that this is prerenal in nature, with multifactorial contributions including diminished renal perfusion as a consequence of decline in oxygen delivery capacity due to acute hypoxic respiratory failure, as above, as well as element of intravascular depletion as a consequence of severe sepsis due to bilateral pneumonia.  Potential pharmacologic exacerbation from home losartan.   Plan: Check urinalysis with microscopy.  Add on random urine sodium as well as random urine creatinine.  Gentle continuous IV fluids overnight  in the form of  lactated Ringer's at 50 cc/h x 10 hours.  Monitor strict I's and O's and daily weights.  Tempt avoid nephrotoxic agents.  Hold home losartan for now.  Repeat CMP in the morning.  We will also hold home gabapentin for now given the exclusive renal clearance of this medication.      #)acute on Chronic hypoosmolar hyponatremia: Documented history of such, with baseline serum sodium range of 127 - 131, with initial corrected serum sodium found to be slightly lower than his baseline range, 125.  However, repeat BMP performed at [facility] has showed interval improvement with IV fluids into patient's baseline serum sodium range, suggesting an element of dehydration, as it would have been anticipated for patient's serum sodium level to possibly worsen if this was a predominantly SIADH driven process in the context of her presenting pneumonia.  Outpatient medications potentially contributing to her chronic hyponatremia include naproxen and Zoloft.  Plan: Monitor strict I's and O's Daily weights.  Repeat CMP in the morning.  Check urinalysis.       #) Type 2 diabetes mellitus: Documented history of such on Tresiba as well as sliding scale NovoLog 3 times daily with meals.  This is also associated with diabetic peripheral polyneuropathy, on both gabapentin and Lyrica as an outpatient.  Presenting blood sugar noted to be 228, in the absence of anion gap metabolic acidosis to suggest DKA.  Plan: We will hold home low-dose basal insulin for now.  Accu-Cheks before every meal and at bedtime with low sliding scale insulin.  In setting of presenting acute kidney injury, will hold home gabapentin/Lyrica for now.  Repeat CMP in the morning.       #) Essential Hypertension: documented history of such, with outpatient antihypertensive regimen including losartan. Blood pressure in the ED today has been in the systolic range of the 272Z to 140s, without evidence of hypotension.  However, in the setting  of presenting severe sepsis as well as acute kidney injury, will hold home losartan for now.   Plan: Close monitoring of subsequent blood pressure via routine vital signs.  Hold home losartan for now, as further detailed above.      #) Acquired hypothyroidism: Documented history of such, on [place] Thyroid supplementation as an outpatient.  Plan: Continue home thyroid supplementation.     #) Chronic tobacco abuse: Patient reports that she is a current smoker, having smoked approximately 1 pack/day over at least the last 20 years.  Plan: Counseled the patient on the importance of complete smoking discontinuation.  As needed nicotine patch has been ordered for use during this hospitalization.        #) Chronic anemia: Documented history of such, with chart review revealing baseline hemoglobin range of 10-11.5.  Presenting hemoglobin found to be slightly lower than this established baseline range, with presenting hemoglobin noted to be 9.4 and associated with normocytic/normochromic findings as well as nonelevated RDW.  Patient denies any recent melena or hematochezia.  No evidence of active bleed at this time.  May represent acute exacerbation of chronic anemia on the basis of acute illness confounded by presenting acute kidney injury.  Will check INR and repeat CBC in the morning, with consideration for expansion of evaluation of patient's anemia should hemoglobin continue to trend down.  Of note, she is on a daily baby aspirin at home, but otherwise on no blood thinners as an outpatient.  Plan: Check INR.  Repeat CBC in the morning.  DVT prophylaxis: SCDs Code Status: Full code Family Communication: none Disposition Plan: Per Rounding Team Consults called: none  Admission status: med-tele;   Of note, this patient was added by me to the following Admit List/Treatment Team:  mcadmits.   Of note, the Adult Admission Order Set (Multimorbid order set) was used by me in the  admission process for this patient.  PLEASE NOTE THAT DRAGON DICTATION SOFTWARE WAS USED IN THE CONSTRUCTION OF THIS NOTE.   Rhetta Mura DO [facility] Pager [phone] From [place]   04/20/2021, 8:58 PM

## 2021-04-21 ENCOUNTER — Inpatient Hospital Stay (HOSPITAL_COMMUNITY): Payer: BC Managed Care – PPO

## 2021-04-21 DIAGNOSIS — Z23 Encounter for immunization: Secondary | ICD-10-CM | POA: Diagnosis not present

## 2021-04-21 DIAGNOSIS — H409 Unspecified glaucoma: Secondary | ICD-10-CM | POA: Diagnosis present

## 2021-04-21 DIAGNOSIS — R7989 Other specified abnormal findings of blood chemistry: Secondary | ICD-10-CM

## 2021-04-21 DIAGNOSIS — Z20822 Contact with and (suspected) exposure to covid-19: Secondary | ICD-10-CM | POA: Diagnosis present

## 2021-04-21 DIAGNOSIS — I10 Essential (primary) hypertension: Secondary | ICD-10-CM | POA: Diagnosis present

## 2021-04-21 DIAGNOSIS — E1151 Type 2 diabetes mellitus with diabetic peripheral angiopathy without gangrene: Secondary | ICD-10-CM | POA: Diagnosis present

## 2021-04-21 DIAGNOSIS — R0682 Tachypnea, not elsewhere classified: Secondary | ICD-10-CM | POA: Diagnosis not present

## 2021-04-21 DIAGNOSIS — R0603 Acute respiratory distress: Secondary | ICD-10-CM | POA: Diagnosis not present

## 2021-04-21 DIAGNOSIS — Z79899 Other long term (current) drug therapy: Secondary | ICD-10-CM | POA: Diagnosis not present

## 2021-04-21 DIAGNOSIS — Z794 Long term (current) use of insulin: Secondary | ICD-10-CM | POA: Diagnosis not present

## 2021-04-21 DIAGNOSIS — N179 Acute kidney failure, unspecified: Secondary | ICD-10-CM | POA: Diagnosis present

## 2021-04-21 DIAGNOSIS — R040 Epistaxis: Secondary | ICD-10-CM | POA: Diagnosis present

## 2021-04-21 DIAGNOSIS — R0602 Shortness of breath: Secondary | ICD-10-CM | POA: Diagnosis present

## 2021-04-21 DIAGNOSIS — J189 Pneumonia, unspecified organism: Secondary | ICD-10-CM | POA: Diagnosis present

## 2021-04-21 DIAGNOSIS — E039 Hypothyroidism, unspecified: Secondary | ICD-10-CM | POA: Diagnosis present

## 2021-04-21 DIAGNOSIS — D649 Anemia, unspecified: Secondary | ICD-10-CM | POA: Diagnosis not present

## 2021-04-21 DIAGNOSIS — E1140 Type 2 diabetes mellitus with diabetic neuropathy, unspecified: Secondary | ICD-10-CM | POA: Diagnosis present

## 2021-04-21 DIAGNOSIS — D529 Folate deficiency anemia, unspecified: Secondary | ICD-10-CM | POA: Diagnosis present

## 2021-04-21 DIAGNOSIS — D509 Iron deficiency anemia, unspecified: Secondary | ICD-10-CM | POA: Diagnosis present

## 2021-04-21 DIAGNOSIS — A419 Sepsis, unspecified organism: Secondary | ICD-10-CM | POA: Diagnosis present

## 2021-04-21 DIAGNOSIS — F17200 Nicotine dependence, unspecified, uncomplicated: Secondary | ICD-10-CM | POA: Diagnosis present

## 2021-04-21 DIAGNOSIS — F32A Depression, unspecified: Secondary | ICD-10-CM | POA: Diagnosis present

## 2021-04-21 DIAGNOSIS — J440 Chronic obstructive pulmonary disease with acute lower respiratory infection: Secondary | ICD-10-CM | POA: Diagnosis present

## 2021-04-21 DIAGNOSIS — R6520 Severe sepsis without septic shock: Secondary | ICD-10-CM | POA: Diagnosis present

## 2021-04-21 DIAGNOSIS — E871 Hypo-osmolality and hyponatremia: Secondary | ICD-10-CM | POA: Diagnosis present

## 2021-04-21 DIAGNOSIS — E1142 Type 2 diabetes mellitus with diabetic polyneuropathy: Secondary | ICD-10-CM | POA: Diagnosis present

## 2021-04-21 DIAGNOSIS — E876 Hypokalemia: Secondary | ICD-10-CM | POA: Diagnosis not present

## 2021-04-21 DIAGNOSIS — Z7982 Long term (current) use of aspirin: Secondary | ICD-10-CM | POA: Diagnosis not present

## 2021-04-21 DIAGNOSIS — I70203 Unspecified atherosclerosis of native arteries of extremities, bilateral legs: Secondary | ICD-10-CM | POA: Diagnosis present

## 2021-04-21 DIAGNOSIS — E11649 Type 2 diabetes mellitus with hypoglycemia without coma: Secondary | ICD-10-CM | POA: Diagnosis not present

## 2021-04-21 DIAGNOSIS — E119 Type 2 diabetes mellitus without complications: Secondary | ICD-10-CM | POA: Diagnosis not present

## 2021-04-21 DIAGNOSIS — J9601 Acute respiratory failure with hypoxia: Secondary | ICD-10-CM | POA: Diagnosis present

## 2021-04-21 DIAGNOSIS — F419 Anxiety disorder, unspecified: Secondary | ICD-10-CM | POA: Diagnosis present

## 2021-04-21 DIAGNOSIS — Z8616 Personal history of COVID-19: Secondary | ICD-10-CM | POA: Diagnosis not present

## 2021-04-21 LAB — PROCALCITONIN: Procalcitonin: 0.27 ng/mL

## 2021-04-21 LAB — CBC WITH DIFFERENTIAL/PLATELET
Abs Immature Granulocytes: 0.09 10*3/uL — ABNORMAL HIGH (ref 0.00–0.07)
Basophils Absolute: 0 10*3/uL (ref 0.0–0.1)
Basophils Relative: 0 %
Eosinophils Absolute: 0 10*3/uL (ref 0.0–0.5)
Eosinophils Relative: 0 %
HCT: 24.7 % — ABNORMAL LOW (ref 36.0–46.0)
Hemoglobin: 8.3 g/dL — ABNORMAL LOW (ref 12.0–15.0)
Immature Granulocytes: 1 %
Lymphocytes Relative: 5 %
Lymphs Abs: 0.7 10*3/uL (ref 0.7–4.0)
MCH: 29.4 pg (ref 26.0–34.0)
MCHC: 33.6 g/dL (ref 30.0–36.0)
MCV: 87.6 fL (ref 80.0–100.0)
Monocytes Absolute: 0.3 10*3/uL (ref 0.1–1.0)
Monocytes Relative: 2 %
Neutro Abs: 12.1 10*3/uL — ABNORMAL HIGH (ref 1.7–7.7)
Neutrophils Relative %: 92 %
Platelets: 374 10*3/uL (ref 150–400)
RBC: 2.82 MIL/uL — ABNORMAL LOW (ref 3.87–5.11)
RDW: 13.6 % (ref 11.5–15.5)
WBC: 13.2 10*3/uL — ABNORMAL HIGH (ref 4.0–10.5)
nRBC: 0 % (ref 0.0–0.2)

## 2021-04-21 LAB — GLUCOSE, CAPILLARY
Glucose-Capillary: 221 mg/dL — ABNORMAL HIGH (ref 70–99)
Glucose-Capillary: 284 mg/dL — ABNORMAL HIGH (ref 70–99)
Glucose-Capillary: 212 mg/dL — ABNORMAL HIGH (ref 70–99)
Glucose-Capillary: 217 mg/dL — ABNORMAL HIGH (ref 70–99)

## 2021-04-21 LAB — BASIC METABOLIC PANEL
Anion gap: 11 (ref 5–15)
BUN: 19 mg/dL (ref 8–23)
CO2: 13 mmol/L — ABNORMAL LOW (ref 22–32)
Calcium: 8.4 mg/dL — ABNORMAL LOW (ref 8.9–10.3)
Chloride: 108 mmol/L (ref 98–111)
Creatinine, Ser: 1.32 mg/dL — ABNORMAL HIGH (ref 0.44–1.00)
GFR, Estimated: 46 mL/min — ABNORMAL LOW (ref >60)
Glucose, Bld: 237 mg/dL — ABNORMAL HIGH (ref 70–99)
Potassium: 4.1 mmol/L (ref 3.5–5.1)
Sodium: 132 mmol/L — ABNORMAL LOW (ref 135–145)

## 2021-04-21 LAB — ECHOCARDIOGRAM COMPLETE
Area-P 1/2: 3.12 cm2
Height: 69 in
MV M vel: 5.31 m/s
MV Peak grad: 112.8 mmHg
Radius: 0.4 cm
S' Lateral: 2.2 cm
Weight: 2416 oz

## 2021-04-21 LAB — COMPREHENSIVE METABOLIC PANEL
ALT: 11 U/L (ref 0–44)
AST: 17 U/L (ref 15–41)
Albumin: 2.6 g/dL — ABNORMAL LOW (ref 3.5–5.0)
Alkaline Phosphatase: 105 U/L (ref 38–126)
Anion gap: 10 (ref 5–15)
BUN: 16 mg/dL (ref 8–23)
CO2: 12 mmol/L — ABNORMAL LOW (ref 22–32)
Calcium: 8.2 mg/dL — ABNORMAL LOW (ref 8.9–10.3)
Chloride: 107 mmol/L (ref 98–111)
Creatinine, Ser: 1.33 mg/dL — ABNORMAL HIGH (ref 0.44–1.00)
GFR, Estimated: 45 mL/min — ABNORMAL LOW (ref >60)
Glucose, Bld: 176 mg/dL — ABNORMAL HIGH (ref 70–99)
Potassium: 4 mmol/L (ref 3.5–5.1)
Sodium: 129 mmol/L — ABNORMAL LOW (ref 135–145)
Total Bilirubin: 0.6 mg/dL (ref 0.3–1.2)
Total Protein: 6.3 g/dL — ABNORMAL LOW (ref 6.5–8.1)

## 2021-04-21 LAB — BLOOD GAS, VENOUS
Acid-base deficit: 11 mmol/L — ABNORMAL HIGH (ref 0.0–2.0)
Bicarbonate: 13.2 mmol/L — ABNORMAL LOW (ref 20.0–28.0)
Drawn by: 6344
O2 Saturation: 89.5 %
Patient temperature: 37
pCO2, Ven: 22.8 mmHg — ABNORMAL LOW (ref 44.0–60.0)
pH, Ven: 7.38 (ref 7.250–7.430)
pO2, Ven: 60.7 mmHg — ABNORMAL HIGH (ref 32.0–45.0)

## 2021-04-21 LAB — MAGNESIUM
Magnesium: 2.1 mg/dL (ref 1.7–2.4)
Magnesium: 2.2 mg/dL (ref 1.7–2.4)

## 2021-04-21 LAB — PROTIME-INR
INR: 1.2 (ref 0.8–1.2)
Prothrombin Time: 15 seconds (ref 11.4–15.2)

## 2021-04-21 LAB — HIV ANTIBODY (ROUTINE TESTING W REFLEX): HIV Screen 4th Generation wRfx: NONREACTIVE

## 2021-04-21 LAB — PHOSPHORUS: Phosphorus: 3.2 mg/dL (ref 2.5–4.6)

## 2021-04-21 LAB — D-DIMER, QUANTITATIVE: D-Dimer, Quant: 1.19 ug/mL-FEU — ABNORMAL HIGH (ref 0.00–0.50)

## 2021-04-21 LAB — TROPONIN I (HIGH SENSITIVITY): Troponin I (High Sensitivity): 12 ng/L (ref <18)

## 2021-04-21 LAB — LACTIC ACID, PLASMA: Lactic Acid, Venous: 1.2 mmol/L (ref 0.5–1.9)

## 2021-04-21 MED ORDER — ALBUTEROL SULFATE HFA 108 (90 BASE) MCG/ACT IN AERS: 1.0000 | INHALATION_SPRAY | Freq: Four times a day (QID) | RESPIRATORY_TRACT | Status: DC | PRN

## 2021-04-21 MED ORDER — BUPROPION HCL ER (XL) 150 MG PO TB24
150.0000 mg | ORAL_TABLET | Freq: Every morning | ORAL | Status: DC
  Administered 2021-04-21 – 2021-04-25 (×5): 150 mg via ORAL
  Filled 2021-04-21 (×5): qty 1

## 2021-04-21 MED ORDER — ADULT MULTIVITAMIN W/MINERALS CH
1.0000 | ORAL_TABLET | Freq: Every morning | ORAL | Status: DC
  Administered 2021-04-21 – 2021-04-25 (×5): 1 via ORAL
  Filled 2021-04-21 (×4): qty 1

## 2021-04-21 MED ORDER — LEVOTHYROXINE SODIUM 50 MCG PO TABS
50.0000 ug | ORAL_TABLET | Freq: Every day | ORAL | Status: DC
  Administered 2021-04-21: 50 ug via ORAL
  Filled 2021-04-21: qty 1

## 2021-04-21 MED ORDER — PREGABALIN 100 MG PO CAPS
300.0000 mg | ORAL_CAPSULE | Freq: Every day | ORAL | Status: DC
  Administered 2021-04-21 – 2021-04-24 (×4): 300 mg via ORAL
  Filled 2021-04-21 (×4): qty 3

## 2021-04-21 MED ORDER — INSULIN GLARGINE-YFGN 100 UNIT/ML ~~LOC~~ SOLN
10.0000 [IU] | Freq: Every day | SUBCUTANEOUS | Status: DC
  Administered 2021-04-21: 10 [IU] via SUBCUTANEOUS
  Filled 2021-04-21 (×2): qty 0.1

## 2021-04-21 MED ORDER — METHYLPREDNISOLONE SODIUM SUCC 40 MG IJ SOLR
40.0000 mg | Freq: Two times a day (BID) | INTRAMUSCULAR | Status: DC
  Administered 2021-04-21 – 2021-04-23 (×4): 40 mg via INTRAVENOUS
  Filled 2021-04-21 (×4): qty 1

## 2021-04-21 MED ORDER — LATANOPROST 0.005 % OP SOLN
1.0000 [drp] | Freq: Every day | OPHTHALMIC | Status: DC
  Administered 2021-04-21 – 2021-04-24 (×4): 1 [drp] via OPHTHALMIC
  Filled 2021-04-21: qty 2.5

## 2021-04-21 MED ORDER — CLOPIDOGREL BISULFATE 75 MG PO TABS
75.0000 mg | ORAL_TABLET | Freq: Every morning | ORAL | Status: DC
  Administered 2021-04-21 – 2021-04-25 (×5): 75 mg via ORAL
  Filled 2021-04-21 (×5): qty 1

## 2021-04-21 MED ORDER — ASPIRIN EC 81 MG PO TBEC
81.0000 mg | DELAYED_RELEASE_TABLET | Freq: Every day | ORAL | Status: DC
  Administered 2021-04-21 – 2021-04-25 (×5): 81 mg via ORAL
  Filled 2021-04-21 (×5): qty 1

## 2021-04-21 MED ORDER — METHAZOLAMIDE 25 MG PO TABS
50.0000 mg | ORAL_TABLET | Freq: Two times a day (BID) | ORAL | Status: DC
  Administered 2021-04-21 – 2021-04-25 (×9): 50 mg via ORAL
  Filled 2021-04-21: qty 1
  Filled 2021-04-21 (×10): qty 2

## 2021-04-21 MED ORDER — PREDNISOLONE ACETATE 1 % OP SUSP
1.0000 [drp] | Freq: Four times a day (QID) | OPHTHALMIC | Status: DC
  Administered 2021-04-21 – 2021-04-25 (×17): 1 [drp] via OPHTHALMIC
  Filled 2021-04-21: qty 5

## 2021-04-21 MED ORDER — SODIUM CHLORIDE 1 G PO TABS
1.0000 g | ORAL_TABLET | Freq: Three times a day (TID) | ORAL | Status: DC | PRN
  Administered 2021-04-21: 1 g via ORAL
  Filled 2021-04-21 (×2): qty 1

## 2021-04-21 MED ORDER — ATORVASTATIN CALCIUM 40 MG PO TABS
40.0000 mg | ORAL_TABLET | Freq: Every morning | ORAL | Status: DC
  Administered 2021-04-21 – 2021-04-25 (×5): 40 mg via ORAL
  Filled 2021-04-21 (×5): qty 1

## 2021-04-21 MED ORDER — INSULIN DEGLUDEC 100 UNIT/ML ~~LOC~~ SOPN: 10.0000 [IU] | PEN_INJECTOR | Freq: Every day | SUBCUTANEOUS | Status: DC

## 2021-04-21 MED ORDER — SODIUM BICARBONATE 650 MG PO TABS
1300.0000 mg | ORAL_TABLET | Freq: Two times a day (BID) | ORAL | Status: DC
  Administered 2021-04-21 – 2021-04-25 (×9): 1300 mg via ORAL
  Filled 2021-04-21 (×10): qty 2

## 2021-04-21 MED ORDER — LEVOTHYROXINE SODIUM 50 MCG PO TABS
50.0000 ug | ORAL_TABLET | Freq: Every day | ORAL | Status: DC
  Administered 2021-04-22 – 2021-04-25 (×4): 50 ug via ORAL
  Filled 2021-04-21 (×4): qty 1

## 2021-04-21 NOTE — Progress Notes (Signed)
Bilateral lower extremity venous duplex completed. Refer to "CV Proc" under chart review to view preliminary results.  04/21/2021 2:41 PM Kelby Aline., MHA, RVT, RDCS, RDMS

## 2021-04-21 NOTE — Progress Notes (Signed)
PROGRESS NOTE    Michelle Nicholson  LFY:[id number] DOB: 1960/12/27 DOA: 04/20/2021 PCP: [facility]   Chief Complaint  Patient presents with   Shortness of Breath  Brief Narrative/Hospital Course: 60 year old female with T2DM, HTN, hypothyroidism chronic hyponatremia 127-131, chronic anemia 10-11 0.5 presented to the [facility] with 3 to 4 days of progressive shortness of breath, new onset of productive cough, subjective fever and chills. In the ED fever 100.2, rest of the vitals fairly stable hypoxic at 89% room air needing 5 L nasal cannula saturating 94 to 92%. VBG notable for 7.249/32.  CMP Sodium 123, which is corrected to approximately 125 taking into account concomitant hyperglycemia, potassium 3.9, bicarbonate 14, anion gap 11, BUN 21, creatinine 1.50 relative to most recent prior value 0.95 in July 2022, glucose 228, and liver enzymes were found to be within normal limits.  BNP 165, leukocytosis 15.4, hemoglobin 9.4 lactic acid 1.5 COVID-19 and influenza PCR negative blood cultures x2 were sent placed on ceftriaxone azithromycin EKG no overt T wave or ST changes chest x-ray HCA airspace opacities in the bilateral lower lobe.  Patient was found to have pneumonia severe sepsis and acute hypoxic respiratory failure admitted  Subjective: Seen and examined this morning. Overnight afebrile T-max 100.2 Labs shows bicarb 12 C/o shortness of breath and feels worse today. [place] weaned to 3l earlier but she has taken it off for eating.  and on RA now spo2 at 95%   Assessment & Plan:  Severe sepsis POA due to pneumonia Bilateral pneumonia: Met severe sepsis criteria with pneumonia, leukocytosis, tachypnea in 36 on admission.  Chest x-ray reviewed.BNP normal troponin negative.Patient complains of ongoing dyspnea shortness of breath despite WBC improving.Will check D-dimer.  Follow-up blood culture, pending urine strep antigen, Legionella.  Continue with current ceftriaxone,  azithro and IV steroids. Recent Labs  Lab 04/20/21 1635 04/20/21 1721 04/20/21 1858 04/21/21 0123 04/21/21 1014  WBC 15.4*  --   --  13.2*  --   LATICACIDVEN  --  1.5 1.2  --  1.2  PROCALCITON  --   --   --  0.27  --     Acute hypoxic respiratory failure in the setting of pneumonia. Will check D dimer.Troponin negative. Cont [place] supplement to maintain spo2 at least > 94 %.D Dimer  mildly up at 1.1- less likely PE - get VQ scan/Duplex.Was able to come off [place] on my exam.  Longstanding smoking history ?suspect underlying COPD:Continue bronchodilators, Solu-Medrol will need PFT outpatient.  AKI: Creatinine improving nicely.  Continue IV fluids repeat BMP today Recent Labs    05/07/20 1946 04/20/21 1635 04/20/21 1948 04/21/21 0123  BUN 15 21 18 16   CREATININE 0.92 1.50* 1.43* 1.33*    Acute on chronic metabolic acidosis bicarb on admission 14, now at 12.  She is on long-term methazolamide. Previous bicarb was in 29, 10/252021. Check lactate, added po bicarb.  Monitor labs.  Chronic hyponatremia: Monitor sodium level.  She takes 1 g salt tablets 3 times daily as needed for "brain fog".  This could be in the setting of her  methalazomide use. Recent Labs  Lab 04/20/21 1635 04/20/21 1648 04/20/21 1948 04/21/21 0123  NA 123* 129* 127* 129*    T2dm with neuropathy with long-term Tresiba:update hemoglobin A1c.  Blood sugar uncontrolled, continue sliding scale insulin and monitor CBG while on steroid.  Resume  longacting insulin at 10 units ( on 16 u tresiba at home).  Resume her Lyrica No results  found for: HGBA1C  Recent Labs  Lab 04/20/21 2051 04/21/21 0829  GLUCAP 125* 221*    Essential hypertension: Blood pressure well controlled holding home losartan and amlodipine for now.  PVD with angioplasty of her legs last year- resume asa 81, plavix and statins  Hypothyroidism: Continue thyroid supplement  Chronic tobaccos use: Cessation advised  Glaucoma  had eye surgery 2 wks  ago- resume prednisone and latanaprost and her  methalazomide  Anxiety/depression: Resume her Wellbutrin/Zoloft.  Chronic anemia: downtrending , transfsue if < 7 gm- monitor. Recent Labs  Lab 04/20/21 1635 04/20/21 1648 04/21/21 0123  HGB 9.4* 9.5* 8.3*  HCT 28.1* 28.0* 24.7*     DVT prophylaxis: SCDs Start: 04/20/21 2102 Code Status:   Code Status: Full Code Family Communication: plan of care discussed with patient at bedside. Status is: Admitted as observation Patient remains hospitalized for ongoing management of hypoxic respiratory failure and bilateral pneumonia and sepsis Dispo: The patient is from: Home              Anticipated d/c is to: Home              Patient currently is not medically stable to d/c.   Difficult to place patient No Objective: Vitals: Today's Vitals   04/21/21 0345 04/21/21 0458 04/21/21 0503 04/21/21 0900  BP:  (!) 135/52  (!) 134/46  Pulse:  69  64  Resp:  20  16  Temp:  98.9 F (37.2 C)  97.6 F (36.4 C)  TempSrc:  Oral  Oral  SpO2:  95%  96%  Weight:      Height:      PainSc: 0-No pain  5  0-No pain   Physical Examination: General exam: Aa0x3, in mild distress, weak,older than stated age. HEENT:Oral mucosa moist, Ear/Nose WNL grossly,dentition normal. Respiratory system: B/l mild expiratory wheezing , no use of accessory muscle, non tender. Cardiovascular system: S1 & S2 +No JVD. Gastrointestinal system: Abdomen soft, NT,ND, BS+. Nervous System:Alert, awake, moving extremities. Extremities: edema none, distal peripheral pulses palpable.  Skin: No rashes, no icterus. MSK: Normal muscle bulk, tone, power.  Medications reviewed:  Scheduled Meds:  aspirin  81 mg Oral Daily   atorvastatin  40 mg Oral q morning   buPROPion  150 mg Oral q morning   clopidogrel  75 mg Oral q morning   insulin aspart  0-9 Units Subcutaneous TID WC   insulin degludec  10 Units Subcutaneous QPC breakfast   latanoprost  1 drop Both Eyes QHS   [START ON  04/22/2021] levothyroxine  50 mcg Oral QAC breakfast   methazolamide  50 mg Oral BID   methylPREDNISolone (SOLU-MEDROL) injection  40 mg Intravenous Q12H   multivitamin with minerals  1 tablet Oral q morning   prednisoLONE acetate  1 drop Right Eye QID   pregabalin  300 mg Oral QHS   sertraline  100 mg Oral Daily   sodium bicarbonate  1,300 mg Oral BID   Continuous Infusions:  azithromycin     cefTRIAXone (ROCEPHIN)  IV     Diet Order             Diet regular Room service appropriate? Yes; Fluid consistency: Thin  Diet effective now                 Intake/Output  Intake/Output Summary (Last 24 hours) at 04/21/2021 1158 Last data filed at 04/21/2021 0427 Gross per 24 hour  Intake 1938.08 ml  Output 600 ml  Net  1338.08 ml   Intake/Output from previous day: 10/08 0701 - 10/09 0700 In: 1938.1 [P.O.:240; I.V.:300; IV Piggyback:1398.1] Out: 600 [Urine:600] Net IO Since Admission: 1,338.08 mL [04/21/21 1158]   Weight change:   Wt Readings from Last 3 Encounters:  04/20/21 68.5 kg  05/07/20 68 kg  04/05/16 68 kg     Consultants:see note  Procedures:see note Antimicrobials: Anti-infectives (From admission, onward)    Start     Dose/Rate Route Frequency Ordered Stop   04/21/21 1800  azithromycin (ZITHROMAX) 500 mg in sodium chloride 0.9 % 250 mL IVPB        500 mg 250 mL/hr over 60 Minutes Intravenous Every 24 hours 04/20/21 2107     04/21/21 1800  cefTRIAXone (ROCEPHIN) 1 g in sodium chloride 0.9 % 100 mL IVPB        1 g 200 mL/hr over 30 Minutes Intravenous Every 24 hours 04/20/21 2107     04/20/21 1730  cefTRIAXone (ROCEPHIN) 1 g in sodium chloride 0.9 % 100 mL IVPB        1 g 200 mL/hr over 30 Minutes Intravenous  Once 04/20/21 1722 04/20/21 1821   04/20/21 1730  azithromycin (ZITHROMAX) 500 mg in sodium chloride 0.9 % 250 mL IVPB        500 mg 250 mL/hr over 60 Minutes Intravenous  Once 04/20/21 1722 04/20/21 1846      Culture/Microbiology Other culture-see  note  Unresulted Labs (From admission, onward)     Start     Ordered   04/22/21 0500  Comprehensive metabolic panel  Daily,   R      04/21/21 0905   04/22/21 0500  CBC  Daily,   R      04/21/21 0905   04/21/21 1505  Basic metabolic panel  Add-on,   AD       Question:  Specimen collection method  Answer:  Lab=Lab collect   04/21/21 1137   04/21/21 1031  D-dimer, quantitative  Add-on,   AD        04/21/21 1031   04/20/21 2134  Strep pneumoniae urinary antigen  Once,   R        04/20/21 2133   04/20/21 2134  Legionella Pneumophila Serogp 1 Ur Ag  Once,   R        04/20/21 2133   04/20/21 2134  Mycoplasma pneumoniae antibody, IgM  Add-on,   AD        04/20/21 2133   04/20/21 2134  Urinalysis, Complete w Microscopic  Once,   R        04/20/21 2133   04/20/21 2134  Sodium, urine, random  Add-on,   AD        04/20/21 2133   04/20/21 2134  Creatinine, urine, random  Add-on,   AD        04/20/21 2133           Data Reviewed: I have personally reviewed following labs and imaging studies CBC: Recent Labs  Lab 04/20/21 1635 04/20/21 1648 04/21/21 0123  WBC 15.4*  --  13.2*  NEUTROABS 13.1*  --  12.1*  HGB 9.4* 9.5* 8.3*  HCT 28.1* 28.0* 24.7*  MCV 89.5  --  87.6  PLT 431*  --  697   Basic Metabolic Panel: Recent Labs  Lab 04/20/21 1635 04/20/21 1648 04/20/21 1948 04/21/21 0123  NA 123* 129* 127* 129*  K 3.9 4.0 3.9 4.0  CL 98  --  105 107  CO2  14*  --  14* 12*  GLUCOSE 228*  --  159* 176*  BUN 21  --  18 16  CREATININE 1.50*  --  1.43* 1.33*  CALCIUM 8.4*  --  7.6* 8.2*  MG  --   --   --  2.1  2.2  PHOS  --   --   --  3.2   GFR: Estimated Creatinine Clearance: 45.8 mL/min (A) (by C-G formula based on SCr of 1.33 mg/dL (H)). Liver Function Tests: Recent Labs  Lab 04/20/21 1635 04/21/21 0123  AST 18 17  ALT 10 11  ALKPHOS 122 105  BILITOT 0.2* 0.6  PROT 7.8 6.3*  ALBUMIN 3.4* 2.6*   No results for input(s): LIPASE, AMYLASE in the last 168 hours. No  results for input(s): AMMONIA in the last 168 hours. Coagulation Profile: Recent Labs  Lab 04/21/21 0123  INR 1.2   Cardiac Enzymes: No results for input(s): CKTOTAL, CKMB, CKMBINDEX, TROPONINI in the last 168 hours. BNP (last 3 results) No results for input(s): PROBNP in the last 8760 hours. HbA1C: No results for input(s): HGBA1C in the last 72 hours. CBG: Recent Labs  Lab 04/20/21 2051 04/21/21 0829  GLUCAP 125* 221*   Lipid Profile: No results for input(s): CHOL, HDL, LDLCALC, TRIG, CHOLHDL, LDLDIRECT in the last 72 hours. Thyroid Function Tests: No results for input(s): TSH, T4TOTAL, FREET4, T3FREE, THYROIDAB in the last 72 hours. Anemia Panel: No results for input(s): VITAMINB12, FOLATE, FERRITIN, TIBC, IRON, RETICCTPCT in the last 72 hours. Sepsis Labs: Recent Labs  Lab 04/20/21 1721 04/20/21 1858 04/21/21 0123 04/21/21 1014  PROCALCITON  --   --  0.27  --   LATICACIDVEN 1.5 1.2  --  1.2    Recent Results (from the past 240 hour(s))  Resp Panel by RT-PCR (Flu A&B, Covid) Nasopharyngeal Swab     Status: None   Collection Time: 04/20/21  4:38 PM   Specimen: Nasopharyngeal Swab; Nasopharyngeal(NP) swabs in vial transport medium  Result Value Ref Range Status   SARS Coronavirus 2 by RT PCR NEGATIVE NEGATIVE Final    Comment: (NOTE) SARS-CoV-2 target nucleic acids are NOT DETECTED.  The SARS-CoV-2 RNA is generally detectable in upper respiratory specimens during the acute phase of infection. The lowest concentration of SARS-CoV-2 viral copies this assay can detect is 138 copies/mL. A negative result does not preclude SARS-Cov-2 infection and should not be used as the sole basis for treatment or other patient management decisions. A negative result may occur with  improper specimen collection/handling, submission of specimen other than nasopharyngeal swab, presence of viral mutation(s) within the areas targeted by this assay, and inadequate number of  viral copies(<138 copies/mL). A negative result must be combined with clinical observations, patient history, and epidemiological information. The expected result is Negative.  Fact Sheet for Patients:  EntrepreneurPulse.com.au  Fact Sheet for Healthcare Providers:  IncredibleEmployment.be  This test is no t yet approved or cleared by the [place] FDA and  has been authorized for detection and/or diagnosis of SARS-CoV-2 by FDA under an Emergency Use Authorization (EUA). This EUA will remain  in effect (meaning this test can be used) for the duration of the COVID-19 declaration under Section 564(b)(1) of the Act, 21 U.S.C.section 360bbb-3(b)(1), unless the authorization is terminated  or revoked sooner.       Influenza A by PCR NEGATIVE NEGATIVE Final   Influenza B by PCR NEGATIVE NEGATIVE Final    Comment: (NOTE) The Xpert Xpress SARS-CoV-2/FLU/RSV plus assay is  intended as an aid in the diagnosis of influenza from Nasopharyngeal swab specimens and should not be used as a sole basis for treatment. Nasal washings and aspirates are unacceptable for Xpert Xpress SARS-CoV-2/FLU/RSV testing.  Fact Sheet for Patients: EntrepreneurPulse.com.au  Fact Sheet for Healthcare Providers: IncredibleEmployment.be  This test is not yet approved or cleared by the [place] FDA and has been authorized for detection and/or diagnosis of SARS-CoV-2 by FDA under an Emergency Use Authorization (EUA). This EUA will remain in effect (meaning this test can be used) for the duration of the COVID-19 declaration under Section 564(b)(1) of the Act, 21 U.S.C. section 360bbb-3(b)(1), unless the authorization is terminated or revoked.  Performed at [facility], [address] [place], [place] [address]   Blood culture (routine x 2)     Status: None (Preliminary result)   Collection Time: 04/20/21  5:22 PM    Specimen: Right Antecubital; Blood  Result Value Ref Range Status   Specimen Description   Final    RIGHT ANTECUBITAL BLOOD Performed at [facility], [address] [place], [place] [address]    Special Requests   Final    Blood Culture adequate volume BOTTLES DRAWN AEROBIC AND ANAEROBIC Performed at [facility], [address] [place], [place] [address]    Culture   Final    NO GROWTH < 12 HOURS Performed at [facility], [place] [address] [place], [place] [address]    Report Status PENDING  Incomplete  Blood culture (routine x 2)     Status: None (Preliminary result)   Collection Time: 04/20/21  5:28 PM   Specimen: BLOOD LEFT WRIST  Result Value Ref Range Status   Specimen Description   Final    BLOOD LEFT WRIST BLOOD Performed at [facility], [address] [place], [place] [address]    Special Requests   Final    Blood Culture adequate volume BOTTLES DRAWN AEROBIC AND ANAEROBIC Performed at [facility], [address] [place], [place] [address]    Culture   Final    NO GROWTH < 12 HOURS Performed at [facility], [place] [address], [place], [place] [address]    Report Status PENDING  Incomplete     Radiology Studies: DG Chest Port 1 View  Result Date: 04/20/2021 CLINICAL DATA:  SOB x 4 days, sore throat, cough, bilat coarse crackles. Hx stroke. Hx pna. EXAM: PORTABLE CHEST 1 VIEW COMPARISON:  Chest x-ray 05/07/2020, CT chest 05/07/2020, chest x-ray 08/12/2019 FINDINGS: The heart and mediastinal contours are unchanged. Aortic calcification. Hazy patchy airspace opacities of bilateral lower mid lung zones. Slightly increased interstitial markings. No pleural effusion. No pneumothorax. No acute osseous abnormality. IMPRESSION: Hazy patchy airspace opacities of bilateral lower mid lung zones. Findings likely represent infection/inflammation. Followup PA and lateral chest X-ray is recommended in 3-4 weeks following therapy  to ensure resolution. Electronically Signed   By: Iven Finn M.D.   On: 04/20/2021 17:30     LOS: 0 days   Antonieta Pert, MD [facility]  04/21/2021, 11:58 AM

## 2021-04-21 NOTE — Progress Notes (Signed)
  Echocardiogram 2D Echocardiogram has been performed.  [name] 04/21/2021, 2:04 PM

## 2021-04-22 ENCOUNTER — Inpatient Hospital Stay (HOSPITAL_COMMUNITY): Payer: BC Managed Care – PPO

## 2021-04-22 LAB — COMPREHENSIVE METABOLIC PANEL
ALT: 14 U/L (ref 0–44)
AST: 21 U/L (ref 15–41)
Albumin: 2.3 g/dL — ABNORMAL LOW (ref 3.5–5.0)
Alkaline Phosphatase: 92 U/L (ref 38–126)
Anion gap: 8 (ref 5–15)
BUN: 23 mg/dL (ref 8–23)
CO2: 16 mmol/L — ABNORMAL LOW (ref 22–32)
Calcium: 8.4 mg/dL — ABNORMAL LOW (ref 8.9–10.3)
Chloride: 108 mmol/L (ref 98–111)
Creatinine, Ser: 1.21 mg/dL — ABNORMAL HIGH (ref 0.44–1.00)
GFR, Estimated: 51 mL/min — ABNORMAL LOW (ref >60)
Glucose, Bld: 217 mg/dL — ABNORMAL HIGH (ref 70–99)
Potassium: 4.1 mmol/L (ref 3.5–5.1)
Sodium: 132 mmol/L — ABNORMAL LOW (ref 135–145)
Total Bilirubin: 0.1 mg/dL — ABNORMAL LOW (ref 0.3–1.2)
Total Protein: 6 g/dL — ABNORMAL LOW (ref 6.5–8.1)

## 2021-04-22 LAB — FERRITIN: Ferritin: 247 ng/mL (ref 11–307)

## 2021-04-22 LAB — GLUCOSE, CAPILLARY
Glucose-Capillary: 223 mg/dL — ABNORMAL HIGH (ref 70–99)
Glucose-Capillary: 246 mg/dL — ABNORMAL HIGH (ref 70–99)
Glucose-Capillary: 203 mg/dL — ABNORMAL HIGH (ref 70–99)

## 2021-04-22 LAB — CBC
HCT: 22.5 % — ABNORMAL LOW (ref 36.0–46.0)
Hemoglobin: 7.6 g/dL — ABNORMAL LOW (ref 12.0–15.0)
MCH: 29.7 pg (ref 26.0–34.0)
MCHC: 33.8 g/dL (ref 30.0–36.0)
MCV: 87.9 fL (ref 80.0–100.0)
Platelets: 371 10*3/uL (ref 150–400)
RBC: 2.56 MIL/uL — ABNORMAL LOW (ref 3.87–5.11)
RDW: 13.9 % (ref 11.5–15.5)
WBC: 15.5 10*3/uL — ABNORMAL HIGH (ref 4.0–10.5)
nRBC: 0 % (ref 0.0–0.2)

## 2021-04-22 LAB — RETICULOCYTES
Immature Retic Fract: 14.3 % (ref 2.3–15.9)
RBC.: 2.55 MIL/uL — ABNORMAL LOW (ref 3.87–5.11)
Retic Count, Absolute: 45.4 10*3/uL (ref 19.0–186.0)
Retic Ct Pct: 1.8 % (ref 0.4–3.1)

## 2021-04-22 LAB — IRON AND TIBC
Iron: 17 ug/dL — ABNORMAL LOW (ref 28–170)
Saturation Ratios: 10 % — ABNORMAL LOW (ref 10.4–31.8)
TIBC: 178 ug/dL — ABNORMAL LOW (ref 250–450)
UIBC: 161 ug/dL

## 2021-04-22 LAB — MYCOPLASMA PNEUMONIAE ANTIBODY, IGM: Mycoplasma pneumo IgM: <770 U/mL (ref 0–769)

## 2021-04-22 LAB — FOLATE: Folate: 5.2 ng/mL — ABNORMAL LOW (ref >5.9)

## 2021-04-22 LAB — VITAMIN B12: Vitamin B-12: 241 pg/mL (ref 180–914)

## 2021-04-22 MED ORDER — INSULIN GLARGINE-YFGN 100 UNIT/ML ~~LOC~~ SOLN
16.0000 [IU] | Freq: Every day | SUBCUTANEOUS | Status: DC
  Administered 2021-04-22 – 2021-04-23 (×2): 16 [IU] via SUBCUTANEOUS
  Filled 2021-04-22 (×3): qty 0.16

## 2021-04-22 MED ORDER — AZITHROMYCIN 250 MG PO TABS
500.0000 mg | ORAL_TABLET | Freq: Every evening | ORAL | Status: DC
  Administered 2021-04-22 – 2021-04-24 (×3): 500 mg via ORAL
  Filled 2021-04-22 (×3): qty 2

## 2021-04-22 MED ORDER — CYANOCOBALAMIN 500 MCG PO TABS
250.0000 ug | ORAL_TABLET | Freq: Every day | ORAL | Status: DC
  Administered 2021-04-22 – 2021-04-25 (×4): 250 ug via ORAL
  Filled 2021-04-22 (×4): qty 1

## 2021-04-22 MED ORDER — SODIUM CHLORIDE 0.9 % IV SOLN: INTRAVENOUS | Status: DC

## 2021-04-22 MED ORDER — TECHNETIUM TO 99M ALBUMIN AGGREGATED
4.1000 | Freq: Once | INTRAVENOUS | Status: AC | PRN
  Administered 2021-04-22: 4.1 via INTRAVENOUS

## 2021-04-22 MED ORDER — PHENOL 1.4 % MT LIQD
1.0000 | OROMUCOSAL | Status: DC | PRN
  Filled 2021-04-22: qty 177

## 2021-04-22 NOTE — Progress Notes (Signed)
PROGRESS NOTE    Michelle Nicholson  OZD:[id number] DOB: 11-25-1960 DOA: 04/20/2021 PCP: Health, [facility]   Chief Complaint  Patient presents with   Shortness of Breath  Brief Narrative/Hospital Course: 60 year old female with T2DM, HTN, hypothyroidism chronic hyponatremia 127-131, chronic anemia 10-11 0.5 presented to the [facility] with 3 to 4 days of progressive shortness of breath, new onset of productive cough, subjective fever and chills. In the ED fever 100.2, rest of the vitals fairly stable hypoxic at 89% room air needing 5 L nasal cannula saturating 94 to 92%. VBG notable for 7.249/32.  CMP Sodium 123, which is corrected to approximately 125 taking into account concomitant hyperglycemia, potassium 3.9, bicarbonate 14, anion gap 11, BUN 21, creatinine 1.50 relative to most recent prior value 0.95 in July 2022, glucose 228, and liver enzymes were found to be within normal limits.  BNP 165, leukocytosis 15.4, hemoglobin 9.4 lactic acid 1.5 COVID-19 and influenza PCR negative blood cultures x2 were sent placed on ceftriaxone azithromycin EKG no overt T wave or ST changes chest x-ray HCA airspace opacities in the bilateral lower lobe.  Patient was found to have pneumonia severe sepsis and acute hypoxic respiratory failure admitted  Subjective: Seen and examined this morning.  Patient reports shortness of breath overall much better oxygen weaned down to 2 L saturating 98% Afebrile overnight slight bump in WBC count creatinine and bicarb level improving   Assessment & Plan:  Severe sepsis POA due to pneumonia Bilateral pneumonia: Met severe sepsis criteria with pneumonia, leukocytosis, tachypnea in 36 on admission.  Chest x-ray reviewed.BNP normal troponin negative.Follow-up blood culture, pending urine strep antigen, Legionella.  Continue current ceftriaxone, azithro and IV steroids, supplemental oxygen bronchodilators.  Wbc bumped likely from steroid.  Clinically she is  feeling better. Recent Labs  Lab 04/20/21 1635 04/20/21 1721 04/20/21 1858 04/21/21 0123 04/21/21 1014 04/22/21 0111  WBC 15.4*  --   --  13.2*  --  15.5*  LATICACIDVEN  --  1.5 1.2  --  1.2  --   PROCALCITON  --   --   --  0.27  --   --     Acute hypoxic respiratory failure in the setting of pneumonia.D Dimer  mildly up at 1.1- less likely PE - pending VQ scan, negative LE  DVT and duplex, echocardiogram no right heart dysfunction, normal EF.  Wean off oxygen as tolerated.  Follow-up chest x-ray VQ scan today  Longstanding smoking history ?suspect underlying COPD:Continue bronchodilators, Solu-Medrol will need PFT outpatient.  AKI: Creatinine is improving nicely continue IV fluids.  Recent Labs    05/07/20 1946 04/20/21 1635 04/20/21 1948 04/21/21 0123 04/21/21 1105 04/22/21 0111  BUN 15 21 18 16 19 23   CREATININE 0.92 1.50* 1.43* 1.33* 1.32* 1.21*    Acute on chronic metabolic acidosis bicarb on admission 14, >12-added oral bicarb level improving.She is on long-term methazolamide. Previous bicarb was in 28, 10/252021.  Normal lactate.  Chronic hyponatremia: Monitor sodium level.  She takes 1 g salt tablets 3 times daily as needed for "brain fog".  Sodium improving.This could be in the setting of her  methalazomide use (but she reports she had hyponatremia even before this med). Recent Labs  Lab 04/20/21 1648 04/20/21 1948 04/21/21 0123 04/21/21 1105 04/22/21 0111  NA 129* 127* 129* 132* 132*    T2dm with neuropathy with long-term Tresiba:update hemoglobin A1c.  Blood sugar poorly controlled, increase Lantus to 16 units, current dose SSI, monitor intake ( on  16 u tresiba at home). No results found for: HGBA1C  Recent Labs  Lab 04/21/21 0829 04/21/21 1237 04/21/21 1635 04/21/21 2040 04/22/21 0929  GLUCAP 221* 284* 212* 217* 223*    Essential hypertension: Well-controlled, holding home losartan a continue mlodipine for now.  PVD with angioplasty of her legs last  year- cont her asa 31, plavix and statins  Hypothyroidism: Continue thyroid supplement  Chronic tobaccos use: Cessation advised  Glaucoma  had eye surgery 2 wks ago- resumed prednisone and latanaprost and her  methalazomide po.  Anxiety/depression:cont her Wellbutrin/Zoloft.  Chronic anemia: Previous baseline hemoglobin 9-10. noted further downtrending of hemoglobin.Transfsue if < 7 gm- monitor.  Check anemia panel and FOBT. Recent Labs  Lab 04/20/21 1635 04/20/21 1648 04/21/21 0123 04/22/21 0111  HGB 9.4* 9.5* 8.3* 7.6*  HCT 28.1* 28.0* 24.7* 22.5*    DVT prophylaxis: SCDs Start: 04/20/21 2102 caution w/ chemical prophylaxis due to anemia, already on aspirin Plavix Code Status:   Code Status: Full Code Family Communication: plan of care discussed with patient at bedside.  Status is: inpatient Patient remains hospitalized for ongoing management of hypoxic respiratory failure and bilateral pneumonia and sepsis Dispo: The patient is from: Home              Anticipated d/c is to: Home              Patient currently is not medically stable to d/c.   Difficult to place patient No Objective: Vitals: Today's Vitals   04/21/21 2041 04/22/21 0347 04/22/21 0745 04/22/21 1101  BP: (!) 140/53 (!) 139/54 (!) 135/44   Pulse: 71 65 69   Resp: 20 20 18    Temp: 97.7 F (36.5 C) 97.7 F (36.5 C) (!) 97.5 F (36.4 C)   TempSrc: Oral Oral Oral   SpO2: 96% 95% 94%   Weight:  66 kg    Height:      PainSc:    0-No pain   Physical Examination: General exam: AAOx 3, pleasant, well  older than stated age, weak appearing. HEENT:Oral mucosa moist, Ear/Nose WNL grossly, dentition normal. Respiratory system: bilaterally diminished at the base, no use of accessory muscle Cardiovascular system: S1 & S2 +, No JVD,. Gastrointestinal system: Abdomen soft, NT,ND, BS+ Nervous System:Alert, awake, moving extremities and grossly nonfocal Extremities: no edema, distal peripheral pulses palpable.   Skin: No rashes,no icterus. MSK: Normal muscle bulk,tone, power   Medications reviewed:  Scheduled Meds:  aspirin EC  81 mg Oral Daily   atorvastatin  40 mg Oral q morning   buPROPion  150 mg Oral q morning   clopidogrel  75 mg Oral q morning   insulin aspart  0-9 Units Subcutaneous TID WC   insulin glargine-yfgn  16 Units Subcutaneous Daily   latanoprost  1 drop Both Eyes QHS   levothyroxine  50 mcg Oral QAC breakfast   methazolamide  50 mg Oral BID   methylPREDNISolone (SOLU-MEDROL) injection  40 mg Intravenous Q12H   multivitamin with minerals  1 tablet Oral q morning   prednisoLONE acetate  1 drop Right Eye QID   pregabalin  300 mg Oral QHS   sertraline  100 mg Oral Daily   sodium bicarbonate  1,300 mg Oral BID   vitamin B-12  250 mcg Oral Daily   Continuous Infusions:  sodium chloride     azithromycin 500 mg (04/21/21 1845)   cefTRIAXone (ROCEPHIN)  IV 1 g (04/21/21 1757)   Diet Order  Diet regular Room service appropriate? Yes; Fluid consistency: Thin  Diet effective now                 Intake/Output  Intake/Output Summary (Last 24 hours) at 04/22/2021 1101 Last data filed at 04/22/2021 0356 Gross per 24 hour  Intake 700 ml  Output 950 ml  Net -250 ml   Intake/Output from previous day: 10/09 0701 - 10/10 0700 In: 700 [P.O.:700] Out: 1450 [Urine:1450] Net IO Since Admission: 588.08 mL [04/22/21 1101]   Weight change: -2.493 kg  Wt Readings from Last 3 Encounters:  04/22/21 66 kg  05/07/20 68 kg  04/05/16 68 kg     Consultants:see note  Procedures:see note Antimicrobials: Anti-infectives (From admission, onward)    Start     Dose/Rate Route Frequency Ordered Stop   04/21/21 1800  azithromycin (ZITHROMAX) 500 mg in sodium chloride 0.9 % 250 mL IVPB        500 mg 250 mL/hr over 60 Minutes Intravenous Every 24 hours 04/20/21 2107     04/21/21 1800  cefTRIAXone (ROCEPHIN) 1 g in sodium chloride 0.9 % 100 mL IVPB        1 g 200 mL/hr  over 30 Minutes Intravenous Every 24 hours 04/20/21 2107     04/20/21 1730  cefTRIAXone (ROCEPHIN) 1 g in sodium chloride 0.9 % 100 mL IVPB        1 g 200 mL/hr over 30 Minutes Intravenous  Once 04/20/21 1722 04/20/21 1821   04/20/21 1730  azithromycin (ZITHROMAX) 500 mg in sodium chloride 0.9 % 250 mL IVPB        500 mg 250 mL/hr over 60 Minutes Intravenous  Once 04/20/21 1722 04/20/21 1846      Culture/Microbiology Other culture-see note  Unresulted Labs (From admission, onward)     Start     Ordered   04/22/21 0845  Occult blood card to lab, stool  ONCE - STAT,   STAT        04/22/21 0844   04/22/21 0500  Comprehensive metabolic panel  Daily,   R      04/21/21 0905   04/22/21 0500  CBC  Daily,   R      04/21/21 0905   04/20/21 2134  Strep pneumoniae urinary antigen  Once,   R        04/20/21 2133   04/20/21 2134  Legionella Pneumophila Serogp 1 Ur Ag  Once,   R        04/20/21 2133   04/20/21 2134  Mycoplasma pneumoniae antibody, IgM  Add-on,   AD        04/20/21 2133   04/20/21 2134  Urinalysis, Complete w Microscopic  Once,   R        04/20/21 2133   04/20/21 2134  Sodium, urine, random  Add-on,   AD        04/20/21 2133   04/20/21 2134  Creatinine, urine, random  Add-on,   AD        04/20/21 2133           Data Reviewed: I have personally reviewed following labs and imaging studies CBC: Recent Labs  Lab 04/20/21 1635 04/20/21 1648 04/21/21 0123 04/22/21 0111  WBC 15.4*  --  13.2* 15.5*  NEUTROABS 13.1*  --  12.1*  --   HGB 9.4* 9.5* 8.3* 7.6*  HCT 28.1* 28.0* 24.7* 22.5*  MCV 89.5  --  87.6 87.9  PLT 431*  --  374  818   Basic Metabolic Panel: Recent Labs  Lab 04/20/21 1635 04/20/21 1648 04/20/21 1948 04/21/21 0123 04/21/21 1105 04/22/21 0111  NA 123* 129* 127* 129* 132* 132*  K 3.9 4.0 3.9 4.0 4.1 4.1  CL 98  --  105 107 108 108  CO2 14*  --  14* 12* 13* 16*  GLUCOSE 228*  --  159* 176* 237* 217*  BUN 21  --  18 16 19 23   CREATININE 1.50*   --  1.43* 1.33* 1.32* 1.21*  CALCIUM 8.4*  --  7.6* 8.2* 8.4* 8.4*  MG  --   --   --  2.1  2.2  --   --   PHOS  --   --   --  3.2  --   --    GFR: Estimated Creatinine Clearance: 50.2 mL/min (A) (by C-G formula based on SCr of 1.21 mg/dL (H)). Liver Function Tests: Recent Labs  Lab 04/20/21 1635 04/21/21 0123 04/22/21 0111  AST 18 17 21   ALT 10 11 14   ALKPHOS 122 105 92  BILITOT 0.2* 0.6 0.1*  PROT 7.8 6.3* 6.0*  ALBUMIN 3.4* 2.6* 2.3*   No results for input(s): LIPASE, AMYLASE in the last 168 hours. No results for input(s): AMMONIA in the last 168 hours. Coagulation Profile: Recent Labs  Lab 04/21/21 0123  INR 1.2   Cardiac Enzymes: No results for input(s): CKTOTAL, CKMB, CKMBINDEX, TROPONINI in the last 168 hours. BNP (last 3 results) No results for input(s): PROBNP in the last 8760 hours. HbA1C: No results for input(s): HGBA1C in the last 72 hours. CBG: Recent Labs  Lab 04/21/21 0829 04/21/21 1237 04/21/21 1635 04/21/21 2040 04/22/21 0929  GLUCAP 221* 284* 212* 217* 223*   Lipid Profile: No results for input(s): CHOL, HDL, LDLCALC, TRIG, CHOLHDL, LDLDIRECT in the last 72 hours. Thyroid Function Tests: No results for input(s): TSH, T4TOTAL, FREET4, T3FREE, THYROIDAB in the last 72 hours. Anemia Panel: Recent Labs    04/21/21 1112 04/22/21 0111  VITAMINB12 241  --   FOLATE 5.2*  --   FERRITIN 247  --   TIBC 178*  --   IRON 17*  --   RETICCTPCT  --  1.8   Sepsis Labs: Recent Labs  Lab 04/20/21 1721 04/20/21 1858 04/21/21 0123 04/21/21 1014  PROCALCITON  --   --  0.27  --   LATICACIDVEN 1.5 1.2  --  1.2    Recent Results (from the past 240 hour(s))  Resp Panel by RT-PCR (Flu A&B, Covid) Nasopharyngeal Swab     Status: None   Collection Time: 04/20/21  4:38 PM   Specimen: Nasopharyngeal Swab; Nasopharyngeal(NP) swabs in vial transport medium  Result Value Ref Range Status   SARS Coronavirus 2 by RT PCR NEGATIVE NEGATIVE Final    Comment:  (NOTE) SARS-CoV-2 target nucleic acids are NOT DETECTED.  The SARS-CoV-2 RNA is generally detectable in upper respiratory specimens during the acute phase of infection. The lowest concentration of SARS-CoV-2 viral copies this assay can detect is 138 copies/mL. A negative result does not preclude SARS-Cov-2 infection and should not be used as the sole basis for treatment or other patient management decisions. A negative result may occur with  improper specimen collection/handling, submission of specimen other than nasopharyngeal swab, presence of viral mutation(s) within the areas targeted by this assay, and inadequate number of viral copies(<138 copies/mL). A negative result must be combined with clinical observations, patient history, and epidemiological information. The expected result is Negative.  Fact Sheet for Patients:  EntrepreneurPulse.com.au  Fact Sheet for Healthcare Providers:  IncredibleEmployment.be  This test is no t yet approved or cleared by the [place] FDA and  has been authorized for detection and/or diagnosis of SARS-CoV-2 by FDA under an Emergency Use Authorization (EUA). This EUA will remain  in effect (meaning this test can be used) for the duration of the COVID-19 declaration under Section 564(b)(1) of the Act, 21 U.S.C.section 360bbb-3(b)(1), unless the authorization is terminated  or revoked sooner.       Influenza A by PCR NEGATIVE NEGATIVE Final   Influenza B by PCR NEGATIVE NEGATIVE Final    Comment: (NOTE) The Xpert Xpress SARS-CoV-2/FLU/RSV plus assay is intended as an aid in the diagnosis of influenza from Nasopharyngeal swab specimens and should not be used as a sole basis for treatment. Nasal washings and aspirates are unacceptable for Xpert Xpress SARS-CoV-2/FLU/RSV testing.  Fact Sheet for Patients: EntrepreneurPulse.com.au  Fact Sheet for Healthcare  Providers: IncredibleEmployment.be  This test is not yet approved or cleared by the [place] FDA and has been authorized for detection and/or diagnosis of SARS-CoV-2 by FDA under an Emergency Use Authorization (EUA). This EUA will remain in effect (meaning this test can be used) for the duration of the COVID-19 declaration under Section 564(b)(1) of the Act, 21 U.S.C. section 360bbb-3(b)(1), unless the authorization is terminated or revoked.  Performed at [facility], [address] [place], [place] [address]   Blood culture (routine x 2)     Status: None (Preliminary result)   Collection Time: 04/20/21  5:22 PM   Specimen: Right Antecubital; Blood  Result Value Ref Range Status   Specimen Description   Final    RIGHT ANTECUBITAL BLOOD Performed at [facility], [address] [place], [place] [address]    Special Requests   Final    Blood Culture adequate volume BOTTLES DRAWN AEROBIC AND ANAEROBIC Performed at [facility], [address] [place], [place] [address]    Culture   Final    NO GROWTH 2 DAYS Performed at [facility], [place] [address], [place], [place] [address]    Report Status PENDING  Incomplete  Blood culture (routine x 2)     Status: None (Preliminary result)   Collection Time: 04/20/21  5:28 PM   Specimen: BLOOD LEFT WRIST  Result Value Ref Range Status   Specimen Description   Final    BLOOD LEFT WRIST BLOOD Performed at [facility], [address] [place], [place] [address]    Special Requests   Final    Blood Culture adequate volume BOTTLES DRAWN AEROBIC AND ANAEROBIC Performed at [facility], [address] [place], [place] [address]    Culture   Final    NO GROWTH 2 DAYS Performed at [facility], [place] [address], [place], [place] [address]    Report Status PENDING  Incomplete     Radiology Studies: DG Chest Port 1 View  Result Date:  04/20/2021 CLINICAL DATA:  SOB x 4 days, sore throat, cough, bilat coarse crackles. Hx stroke. Hx pna. EXAM: PORTABLE CHEST 1 VIEW COMPARISON:  Chest x-ray 05/07/2020, CT chest 05/07/2020, chest x-ray 08/12/2019 FINDINGS: The heart and mediastinal contours are unchanged. Aortic calcification. Hazy patchy airspace opacities of bilateral lower mid lung zones. Slightly increased interstitial markings. No pleural effusion. No pneumothorax. No acute osseous abnormality. IMPRESSION: Hazy patchy airspace  opacities of bilateral lower mid lung zones. Findings likely represent infection/inflammation. Followup PA and lateral chest X-ray is recommended in 3-4 weeks following therapy to ensure resolution. Electronically Signed   By: Iven Finn M.D.   On: 04/20/2021 17:30   ECHOCARDIOGRAM COMPLETE  Result Date: 04/21/2021    ECHOCARDIOGRAM REPORT   Patient Name:   DAVE MANNES Date of Exam: 04/21/2021 Medical Rec #:  [id number]  Height:       69.0 in Accession #:    [id number] Weight:       151.0 lb Date of Birth:  [birth year]  BSA:          1.833 m Patient Age:    72 years   BP:           134/46 mmHg Patient Gender: F          HR:           62 bpm. Exam Location:  Inpatient Procedure: 2D Echo, Cardiac Doppler and Color Doppler Indications:    [place] respiratory distress R06.03  History:        Patient has no prior history of Echocardiogram examinations.                 Risk Factors:Diabetes and Hypertension.  Sonographer:    Bernadene Person RDCS Referring Phys: [id number] [place] IMPRESSIONS  1. Left ventricular ejection fraction, by estimation, is 60 to 65%. The left ventricle has normal function. The left ventricle has no regional wall motion abnormalities. Left ventricular diastolic parameters are consistent with Grade II diastolic dysfunction (pseudonormalization).  2. Right ventricular systolic function is normal. The right ventricular size is normal. There is normal pulmonary artery systolic pressure.  3. Left atrial  size was mildly dilated.  4. The mitral valve is normal in structure. Mild to moderate mitral valve regurgitation. No evidence of mitral stenosis.  5. The aortic valve is normal in structure. Aortic valve regurgitation is not visualized. Mild aortic valve sclerosis is present, with no evidence of aortic valve stenosis.  6. The inferior vena cava is normal in size with greater than 50% respiratory variability, suggesting right atrial pressure of 3 mmHg. FINDINGS  Left Ventricle: Left ventricular ejection fraction, by estimation, is 60 to 65%. The left ventricle has normal function. The left ventricle has no regional wall motion abnormalities. The left ventricular internal cavity size was normal in size. There is  no left ventricular hypertrophy. Left ventricular diastolic parameters are consistent with Grade II diastolic dysfunction (pseudonormalization). Right Ventricle: The right ventricular size is normal. No increase in right ventricular wall thickness. Right ventricular systolic function is normal. There is normal pulmonary artery systolic pressure. The tricuspid regurgitant velocity is 1.87 m/s, and  with an assumed right atrial pressure of 8 mmHg, the estimated right ventricular systolic pressure is 48.1 mmHg. Left Atrium: Left atrial size was mildly dilated. Right Atrium: Right atrial size was normal in size. Pericardium: There is no evidence of pericardial effusion. Mitral Valve: The mitral valve is normal in structure. Mild mitral annular calcification. Mild to moderate mitral valve regurgitation. No evidence of mitral valve stenosis. Tricuspid Valve: The tricuspid valve is normal in structure. Tricuspid valve regurgitation is not demonstrated. No evidence of tricuspid stenosis. Aortic Valve: The aortic valve is normal in structure. Aortic valve regurgitation is not visualized. Mild aortic valve sclerosis is present, with no evidence of aortic valve stenosis. Pulmonic Valve: The pulmonic valve was normal in  structure. Pulmonic valve regurgitation is not visualized. No  evidence of pulmonic stenosis. Aorta: The aortic root is normal in size and structure. Venous: The inferior vena cava is normal in size with greater than 50% respiratory variability, suggesting right atrial pressure of 3 mmHg. IAS/Shunts: No atrial level shunt detected by color flow Doppler.  LEFT VENTRICLE PLAX 2D LVIDd:         4.20 cm   Diastology LVIDs:         2.20 cm   LV e' medial:    6.41 cm/s LV PW:         1.10 cm   LV E/e' medial:  18.3 LV IVS:        1.10 cm   LV e' lateral:   8.03 cm/s LVOT diam:     2.10 cm   LV E/e' lateral: 14.6 LV SV:         94 LV SV Index:   51 LVOT Area:     3.46 cm  RIGHT VENTRICLE RV S prime:     14.80 cm/s TAPSE (M-mode): 2.6 cm LEFT ATRIUM             Index        RIGHT ATRIUM           Index LA diam:        3.10 cm 1.69 cm/m   RA Area:     13.30 cm LA Vol (A2C):   61.1 ml 33.33 ml/m  RA Volume:   29.40 ml  16.04 ml/m LA Vol (A4C):   59.1 ml 32.24 ml/m LA Biplane Vol: 60.1 ml 32.78 ml/m  AORTIC VALVE LVOT Vmax:   112.00 cm/s LVOT Vmean:  73.200 cm/s LVOT VTI:    0.272 m  AORTA Ao Root diam: 3.50 cm Ao Asc diam:  3.60 cm MITRAL VALVE                  TRICUSPID VALVE MV Area (PHT): 3.12 cm       TR Peak grad:   14.0 mmHg MV Decel Time: 243 msec       TR Vmax:        187.00 cm/s MR Peak grad:    112.8 mmHg MR Mean grad:    84.0 mmHg    SHUNTS MR Vmax:         531.00 cm/s  Systemic VTI:  0.27 m MR Vmean:        440.0 cm/s   Systemic Diam: 2.10 cm MR PISA:         1.01 cm MR PISA Eff ROA: 7 mm MR PISA Radius:  0.40 cm MV E velocity: 117.00 cm/s MV A velocity: 100.00 cm/s MV E/A ratio:  1.17 Mihai Croitoru MD Electronically signed by Sanda Klein MD Signature Date/Time: 04/21/2021/3:22:03 PM    Final    VAS [place] LOWER EXTREMITY VENOUS (DVT)  Result Date: 04/22/2021  Lower Venous DVT Study Patient Name:  Michelle Nicholson  Date of Exam:   04/21/2021 Medical Rec #: [id number]   Accession #:    [id number] Date of  Birth: [birth year]   Patient Gender: F Patient Age:   17 years Exam Location:  [facility] Procedure:      VAS [place] LOWER EXTREMITY VENOUS (DVT) Referring Phys: [place] --------------------------------------------------------------------------------  Indications: Elevated d-dimer.  Comparison Study: No prior study Performing Technologist: Maudry Mayhew MHA, RDMS, RVT, RDCS  Examination Guidelines: A complete evaluation includes B-mode imaging, spectral Doppler, color Doppler, and power Doppler as needed of all accessible portions  of each vessel. Bilateral testing is considered an integral part of a complete examination. Limited examinations for reoccurring indications may be performed as noted. The reflux portion of the exam is performed with the patient in reverse Trendelenburg.  +---------+---------------+---------+-----------+----------+--------------+ RIGHT    CompressibilityPhasicitySpontaneityPropertiesThrombus Aging +---------+---------------+---------+-----------+----------+--------------+ CFV      Full           Yes      Yes                                 +---------+---------------+---------+-----------+----------+--------------+ SFJ      Full                                                        +---------+---------------+---------+-----------+----------+--------------+ FV Prox  Full                                                        +---------+---------------+---------+-----------+----------+--------------+ FV Mid   Full                                                        +---------+---------------+---------+-----------+----------+--------------+ FV DistalFull                                                        +---------+---------------+---------+-----------+----------+--------------+ PFV      Full                                                        +---------+---------------+---------+-----------+----------+--------------+ POP       Full           Yes      Yes                                 +---------+---------------+---------+-----------+----------+--------------+ PTV      Full                                                        +---------+---------------+---------+-----------+----------+--------------+ PERO     Full                                                        +---------+---------------+---------+-----------+----------+--------------+   +---------+---------------+---------+-----------+----------+--------------+ LEFT  CompressibilityPhasicitySpontaneityPropertiesThrombus Aging +---------+---------------+---------+-----------+----------+--------------+ CFV      Full           Yes      Yes                                 +---------+---------------+---------+-----------+----------+--------------+ SFJ      Full                                                        +---------+---------------+---------+-----------+----------+--------------+ FV Prox  Full                                                        +---------+---------------+---------+-----------+----------+--------------+ FV Mid   Full                                                        +---------+---------------+---------+-----------+----------+--------------+ FV DistalFull                                                        +---------+---------------+---------+-----------+----------+--------------+ PFV      Full                                                        +---------+---------------+---------+-----------+----------+--------------+ POP      Full           Yes      Yes                                 +---------+---------------+---------+-----------+----------+--------------+ PTV      Full                                                        +---------+---------------+---------+-----------+----------+--------------+ PERO     Full                                                         +---------+---------------+---------+-----------+----------+--------------+     Summary: BILATERAL: - No evidence of deep vein thrombosis seen in the lower extremities, bilaterally. -No evidence of popliteal cyst, bilaterally.   *See table(s) above for measurements and observations. Electronically signed by Orlie Pollen on 04/22/2021 at 6:48:39 AM.    Final  LOS: 1 day   Antonieta Pert, MD [facility]  04/22/2021, 11:01 AM

## 2021-04-22 NOTE — Consult Note (Signed)
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  Wound type: full thickness surgical  Pressure Injury POA: NA Measurement: 3cm x 1.5cm x 0.5cm  Wound FMM:CRFVOH; pale  Drainage (amount, consistency, odor) moderate with odor Periwound:macerated  Dressing procedure/placement/frequency: Silver hydrofiber for infected surgical wound, wound edges are so macerated from having a non absorbant dressing in place (DuoDerm). This will be absorbant and treat any biofilm/bioburdan on the wound bed. The periwound is intact and clean so I do not suspect an acute infection.  Top with foam dressing; change every other day while inpatient  Follow up with dermatology as scheduled as an outpatient.   Discussed POC with patient and bedside nurse.  Re consult if needed, will not follow at this time. Thanks  Janaiya Beauchesne [employer], RN,CWOCN, CNS, [place] [phone])

## 2021-04-22 NOTE — Progress Notes (Signed)
Patient made aware nursing staff still have to collect urine specimen. Verbalized understanding not to flush urine. Collection hat/container provided. Day shift RN, Leatrice Jewels, aware of need to collect urine specimen.

## 2021-04-23 LAB — COMPREHENSIVE METABOLIC PANEL
ALT: 27 U/L (ref 0–44)
AST: 45 U/L — ABNORMAL HIGH (ref 15–41)
Albumin: 2.3 g/dL — ABNORMAL LOW (ref 3.5–5.0)
Alkaline Phosphatase: 112 U/L (ref 38–126)
Anion gap: 8 (ref 5–15)
BUN: 27 mg/dL — ABNORMAL HIGH (ref 8–23)
CO2: 15 mmol/L — ABNORMAL LOW (ref 22–32)
Calcium: 8.3 mg/dL — ABNORMAL LOW (ref 8.9–10.3)
Chloride: 109 mmol/L (ref 98–111)
Creatinine, Ser: 1.17 mg/dL — ABNORMAL HIGH (ref 0.44–1.00)
GFR, Estimated: 53 mL/min — ABNORMAL LOW (ref >60)
Glucose, Bld: 255 mg/dL — ABNORMAL HIGH (ref 70–99)
Potassium: 4.4 mmol/L (ref 3.5–5.1)
Sodium: 132 mmol/L — ABNORMAL LOW (ref 135–145)
Total Bilirubin: 0.6 mg/dL (ref 0.3–1.2)
Total Protein: 5.9 g/dL — ABNORMAL LOW (ref 6.5–8.1)

## 2021-04-23 LAB — CBC
HCT: 22.8 % — ABNORMAL LOW (ref 36.0–46.0)
Hemoglobin: 7.3 g/dL — ABNORMAL LOW (ref 12.0–15.0)
MCH: 28.7 pg (ref 26.0–34.0)
MCHC: 32 g/dL (ref 30.0–36.0)
MCV: 89.8 fL (ref 80.0–100.0)
Platelets: 391 10*3/uL (ref 150–400)
RBC: 2.54 MIL/uL — ABNORMAL LOW (ref 3.87–5.11)
RDW: 14.1 % (ref 11.5–15.5)
WBC: 12.7 10*3/uL — ABNORMAL HIGH (ref 4.0–10.5)
nRBC: 0 % (ref 0.0–0.2)

## 2021-04-23 LAB — GLUCOSE, CAPILLARY
Glucose-Capillary: 277 mg/dL — ABNORMAL HIGH (ref 70–99)
Glucose-Capillary: 308 mg/dL — ABNORMAL HIGH (ref 70–99)
Glucose-Capillary: 64 mg/dL — ABNORMAL LOW (ref 70–99)
Glucose-Capillary: 319 mg/dL — ABNORMAL HIGH (ref 70–99)

## 2021-04-23 MED ORDER — INSULIN ASPART 100 UNIT/ML IJ SOLN
7.0000 [IU] | Freq: Once | INTRAMUSCULAR | Status: AC
  Administered 2021-04-23: 7 [IU] via SUBCUTANEOUS

## 2021-04-23 MED ORDER — UMECLIDINIUM BROMIDE 62.5 MCG/INH IN AEPB
1.0000 | INHALATION_SPRAY | Freq: Every day | RESPIRATORY_TRACT | Status: DC
  Administered 2021-04-24 – 2021-04-25 (×2): 1 via RESPIRATORY_TRACT
  Filled 2021-04-23: qty 7

## 2021-04-23 MED ORDER — FOLIC ACID 1 MG PO TABS
1.0000 mg | ORAL_TABLET | Freq: Every day | ORAL | Status: DC
  Administered 2021-04-23 – 2021-04-25 (×3): 1 mg via ORAL
  Filled 2021-04-23 (×3): qty 1

## 2021-04-23 MED ORDER — BUDESONIDE 0.25 MG/2ML IN SUSP
0.2500 mg | Freq: Two times a day (BID) | RESPIRATORY_TRACT | Status: DC
  Administered 2021-04-23: 0.25 mg via RESPIRATORY_TRACT
  Filled 2021-04-23: qty 2

## 2021-04-23 MED ORDER — INSULIN ASPART 100 UNIT/ML IJ SOLN
5.0000 [IU] | Freq: Three times a day (TID) | INTRAMUSCULAR | Status: DC
  Administered 2021-04-23: 5 [IU] via SUBCUTANEOUS

## 2021-04-23 MED ORDER — PREDNISONE 20 MG PO TABS
20.0000 mg | ORAL_TABLET | Freq: Every day | ORAL | Status: DC
  Administered 2021-04-24: 20 mg via ORAL
  Filled 2021-04-23: qty 1

## 2021-04-23 MED ORDER — FERROUS SULFATE 325 (65 FE) MG PO TABS
325.0000 mg | ORAL_TABLET | Freq: Two times a day (BID) | ORAL | Status: DC
  Administered 2021-04-23 – 2021-04-25 (×4): 325 mg via ORAL
  Filled 2021-04-23 (×4): qty 1

## 2021-04-23 NOTE — Progress Notes (Signed)
Inpatient Diabetes Program Recommendations  AACE/ADA: New Consensus Statement on Inpatient Glycemic Control (2015)  Target Ranges:  Prepandial:   less than 140 mg/dL      Peak postprandial:   less than 180 mg/dL (1-2 hours)      Critically ill patients:  140 - 180 mg/dL   Lab Results  Component Value Date   GLUCAP 277 (H) 04/23/2021    Review of Glycemic Control Results for ASIAH, BROWDER (MRN [id number]) as of 04/23/2021 10:55  Ref. Range 04/22/2021 09:29 04/22/2021 12:53 04/22/2021 17:04 04/23/2021 09:15  Glucose-Capillary Latest Ref Range: 70 - 99 mg/dL 223 (H) 246 (H) 203 (H) 277 (H)   Diabetes history: DM2 Outpatient Diabetes medications: Tresiba 16 units qhs, Novolog 4 units tid meal coverage Current orders for Inpatient glycemic control: Semglee 16 units qd, Novolog 0-9 units tid correction, Solumedrol 40 mg q 12 hrs.  Inpatient Diabetes Program Recommendations:   While on steroids: -Add Novolog 4 units tid meal coverage if eats 50% -Increase Novolog correction to 0-15 units tid + hs 0-5 units Secure chat sent to Dr. Antonieta Pert.  Thank you, Nani Gasser. Kendalynn Wideman, RN, MSN, CDE  Diabetes Coordinator Inpatient Glycemic Control Team Team Pager [phone] (8am-5pm) 04/23/2021 10:56 AM

## 2021-04-23 NOTE — Evaluation (Signed)
Physical Therapy Evaluation Patient Details Name: Michelle Nicholson MRN: [id number] DOB: 1960-12-02 Today's Date: 04/23/2021  History of Present Illness  Pt is a 60 y.o. F who presents with progressive SOB, cough, subjective fever and chills. Pt found to have severe sepsis due to pneumonia with associated leukocytosis, tachypnea. Signifiacnt PMH: chronic hyponatremia, T2DM, HTN, chronic anemia.  Clinical Impression  PTA, pt lives alone and is independent; she is typically active and enjoys dancing. Pt presents with decreased cardiopulmonary endurance compared to her baseline. Pt ambulating 150 feet with a walker at a modI level (walker used for energy conservation only). Pt maintaining 90-95% on RA for majority of walk; brief desaturation to 87%, but rebounded quickly with short standing rest break. Instruction provided for IS use; pt pulling 750 mL. Education additionally provided regarding energy conservation techniques, activity recommendations, home set up. No PT follow up anticipated.      Recommendations for follow up therapy are one component of a multi-disciplinary discharge planning process, led by the attending physician.  Recommendations may be updated based on patient status, additional functional criteria and insurance authorization.  Follow Up Recommendations No PT follow up;Supervision - Intermittent    Equipment Recommendations  None recommended by PT    Recommendations for Other Services       Precautions / Restrictions Precautions Precautions: None Restrictions Weight Bearing Restrictions: No      Mobility  Bed Mobility Overal bed mobility: Independent                  Transfers Overall transfer level: Independent Equipment used: None                Ambulation/Gait Ambulation/Gait assistance: Modified independent (Device/Increase time) Gait Distance (Feet): 150 Feet Assistive device: Rolling walker (2 wheeled) Gait Pattern/deviations: WFL(Within  Functional Limits) Gait velocity: decreased   General Gait Details: Walker used for energy conservation only; no gross imbalance noted  Financial trader Rankin (Stroke Patients Only)       Balance Overall balance assessment: No apparent balance deficits (not formally assessed)                                           Pertinent Vitals/Pain Pain Assessment: No/denies pain    Home Living Family/patient expects to be discharged to:: Private residence Living Arrangements: Alone Available Help at Discharge: Family Type of Home: House Home Access: Stairs to enter Entrance Stairs-Rails: Left Entrance Stairs-Number of Steps: 3 Home Layout: One level Home Equipment: Shower seat - built in;Walker - 2 wheels;Cane - single point      Prior Function Level of Independence: Independent         Comments: Retired Agricultural engineer        Extremity/Trunk Assessment   Upper Extremity Assessment Upper Extremity Assessment: Overall WFL for tasks assessed    Lower Extremity Assessment Lower Extremity Assessment: Overall WFL for tasks assessed    Cervical / Trunk Assessment Cervical / Trunk Assessment: Normal  Communication   Communication: No difficulties  Cognition Arousal/Alertness: Awake/alert Behavior During Therapy: WFL for tasks assessed/performed Overall Cognitive Status: Within Functional Limits for tasks assessed  General Comments      Exercises     Assessment/Plan    PT Assessment Patient needs continued PT services  PT Problem List Decreased activity tolerance;Decreased mobility;Cardiopulmonary status limiting activity       PT Treatment Interventions Gait training;Stair training;Functional mobility training;Therapeutic activities;Therapeutic exercise;Balance training;Patient/family education    PT Goals (Current goals can  be found in the Care Plan section)  Acute Rehab PT Goals Patient Stated Goal: return to baseline PT Goal Formulation: With patient Time For Goal Achievement: 05/07/21 Potential to Achieve Goals: Good    Frequency Min 3X/week   Barriers to discharge        Co-evaluation               AM-PAC PT "6 Clicks" Mobility  Outcome Measure Help needed turning from your back to your side while in a flat bed without using bedrails?: None Help needed moving from lying on your back to sitting on the side of a flat bed without using bedrails?: None Help needed moving to and from a bed to a chair (including a wheelchair)?: None Help needed standing up from a chair using your arms (e.g., wheelchair or bedside chair)?: None Help needed to walk in hospital room?: None Help needed climbing 3-5 steps with a railing? : A Little 6 Click Score: 23    End of Session   Activity Tolerance: Patient tolerated treatment well Patient left: with call bell/phone within reach;Other (comment);with family/visitor present (sitting EOB) Nurse Communication: Mobility status PT Visit Diagnosis: Difficulty in walking, not elsewhere classified (R26.2)    Time: 8242-3536 PT Time Calculation (min) (ACUTE ONLY): 35 min   Charges:   PT Evaluation $PT Eval Moderate Complexity: 1 Mod PT Treatments $Therapeutic Activity: 8-22 mins        Wyona Almas, PT, DPT Acute Rehabilitation Services Pager [phone] Office [phone]   Deno Etienne 04/23/2021, 4:49 PM

## 2021-04-23 NOTE — Progress Notes (Signed)
PROGRESS NOTE    Michelle Nicholson  HKV:[id number] DOB: 1960/09/16 DOA: 04/20/2021 PCP: [facility], [facility]   Chief Complaint  Patient presents with   Shortness of Breath  Brief Narrative/Hospital Course: 60 year old female with T2DM, HTN, hypothyroidism chronic hyponatremia 127-131, chronic anemia 10-11 0.5 presented to the [facility] with 3 to 4 days of progressive shortness of breath, new onset of productive cough, subjective fever and chills. In the ED fever 100.2, rest of the vitals fairly stable hypoxic at 89% room air needing 5 L nasal cannula saturating 94 to 92%. VBG notable for 7.249/32.  CMP Sodium 123, which is corrected to approximately 125 taking into account concomitant hyperglycemia, potassium 3.9, bicarbonate 14, anion gap 11, BUN 21, creatinine 1.50 relative to most recent prior value 0.95 in July 2022, glucose 228, and liver enzymes were found to be within normal limits.  BNP 165, leukocytosis 15.4, hemoglobin 9.4 lactic acid 1.5 COVID-19 and influenza PCR negative blood cultures x2 were sent placed on ceftriaxone azithromycin EKG no overt T wave or ST changes chest x-ray HCA airspace opacities in the bilateral lower lobe.  Patient was found to have pneumonia severe sepsis and acute hypoxic respiratory failure admitted  Subjective: Feeling better but has DOE Overnight no fever doing well on room air.  WBC counts improving. No new complaints.  Assessment & Plan:  Severe sepsis POA due to pneumonia Bilateral pneumonia: Met severe sepsis criteria with pneumonia, leukocytosis, tachypnea in 36 on admission.BNP normal troponin negative.blood culture no growth so far, patient is clinically improving leukocytosis improving continue ceftriaxone azithromycin change to oral steroid.  Urine antigen pending. Recent Labs  Lab 04/20/21 1635 04/20/21 1721 04/20/21 1858 04/21/21 0123 04/21/21 1014 04/22/21 0111 04/23/21 0109  WBC 15.4*  --   --  13.2*  --  15.5* 12.7*   LATICACIDVEN  --  1.5 1.2  --  1.2  --   --   PROCALCITON  --   --   --  0.27  --   --   --     Acute hypoxic respiratory failure in the setting of pneumonia.D Dimer  mildly up at 1.1- less likely PE VQ scan was normal duplex negative and echo with no right heart dysfunction, normal EF.  WEAN OFF O2. Is still dyspnea and and short of breath tomorrow can try lasix  intermittently if creat stable  Longstanding smoking history ?suspect underlying COPD:Continue bronchodilators,  add pulnicort, add incruse, cont steroid-will need PFT outpatient and pulmonary follow-up. She says her PCP had given disk inhaler but could not take powder- soudns like advair.  AKI: Creatinine is improving nicely -stop IV fluids encourage oral intake  Recent Labs    05/07/20 1946 04/20/21 1635 04/20/21 1948 04/21/21 0123 04/21/21 1105 04/22/21 0111 04/23/21 0109  BUN $Re'15 21 18 16 19 23 'Swe$ 27*  CREATININE 0.92 1.50* 1.43* 1.33* 1.32* 1.21* 1.17*    Acute on chronic metabolic acidosis bicarb on admission 14, >12-added oral bicarb -and bicarb level improving.She is on long-term methazolamide. Previous bicarb was in 2, 10/252021.  Here has normal lactate level.  Chronic hyponatremia:stable and improved.She takes 1 g salt tablets 3 times daily as needed for "brain fog".  This could be in the setting of her  methalazomide use (but she reports she had hyponatremia even before this med). Recent Labs  Lab 04/20/21 1948 04/21/21 0123 04/21/21 1105 04/22/21 0111 04/23/21 0109  NA 127* 129* 132* 132* 132*    T2dm with neuropathy with long-term  Tresiba: Poorly controlled likely from steroid use.  Continue Lantus to 16 units, current dose SSI, add Premeal insulin ( on 16 u tresiba at home). No results found for: HGBA1C  Recent Labs  Lab 04/22/21 0929 04/22/21 1253 04/22/21 1704 04/23/21 0915 04/23/21 1202  GLUCAP 223* 246* 203* 277* 308*    Essential hypertension: Stable.  Holding home losartan a continue amlodipine  for now.  PVD with angioplasty of her legs last year- cont her asa 46, plavix and statins  Hypothyroidism: Continue her thyroid supplement  Chronic tobaccos use: Cessation advised  Glaucoma  had eye surgery 2 wks ago- resumed prednisone and latanaprost and her  methalazomide po.  Anxiety/depression:cont her Wellbutrin/Zoloft.  Chronic anemia Folate deficiency B12 insufficiency iron deficiency: Previous baseline hemoglobin 9-10.  Hb downtrending. Transfsue if < 7 gm- checked anemia panel B12 is borderline, folic acid is < 5.2, iron low- add  b12, folate and iron supplement. pending FOBT. She is on dual antiplatelet. Recent Labs  Lab 04/20/21 1635 04/20/21 1648 04/21/21 0123 04/22/21 0111 04/23/21 0109  HGB 9.4* 9.5* 8.3* 7.6* 7.3*  HCT 28.1* 28.0* 24.7* 22.5* 22.8*    DVT prophylaxis: SCDs Start: 04/20/21 2102 caution w/ chemical prophylaxis due to anemia, already on aspirin Plavix Code Status:   Code Status: Full Code Family Communication: plan of care discussed with patient at bedside.  Status is: inpatient Patient remains hospitalized for ongoing management of hypoxic respiratory failure and bilateral pneumonia and sepsis Dispo: The patient is from: Home              Anticipated d/c is to: Home. PTOT eval today              Patient currently is not medically stable to d/c.   Difficult to place patient No Objective: Vitals: Today's Vitals   04/22/21 2000 04/22/21 2053 04/23/21 0500 04/23/21 0815  BP:  (!) 157/55 (!) 153/61 (!) 146/45  Pulse:  66 71 70  Resp:  18 18 (!) 21  Temp:  97.7 F (36.5 C) (!) 97.3 F (36.3 C) 98.2 F (36.8 C)  TempSrc:  Oral Oral Oral  SpO2:  97% 92% 97%  Weight:      Height:      PainSc: 0-No pain      Physical Examination: General exam: AAOx 3, thin, frail, HEENT:Oral mucosa moist, Ear/Nose WNL grossly, dentition normal. Respiratory system: bilaterally basal crackles,no use of accessory muscle Cardiovascular system: S1 & S2 +, No  JVD,. Gastrointestinal system: Abdomen soft,NT,ND, BS+ Nervous System:Alert, awake, moving extremities and grossly nonfocal Extremities: No edema, distal peripheral pulses palpable.  Skin: No rashes,no icterus. MSK: Normal muscle bulk,tone, power   Medications reviewed:  Scheduled Meds:  aspirin EC  81 mg Oral Daily   atorvastatin  40 mg Oral q morning   azithromycin  500 mg Oral QPM   buPROPion  150 mg Oral q morning   clopidogrel  75 mg Oral q morning   ferrous sulfate  325 mg Oral BID WC   folic acid  1 mg Oral Daily   insulin aspart  0-9 Units Subcutaneous TID WC   insulin aspart  5 Units Subcutaneous TID WC   insulin glargine-yfgn  16 Units Subcutaneous Daily   latanoprost  1 drop Both Eyes QHS   levothyroxine  50 mcg Oral QAC breakfast   methazolamide  50 mg Oral BID   multivitamin with minerals  1 tablet Oral q morning   prednisoLONE acetate  1 drop Right Eye  QID   [START ON 04/24/2021] predniSONE  20 mg Oral Q breakfast   pregabalin  300 mg Oral QHS   sertraline  100 mg Oral Daily   sodium bicarbonate  1,300 mg Oral BID   vitamin B-12  250 mcg Oral Daily   Continuous Infusions:  cefTRIAXone (ROCEPHIN)  IV 1 g (04/22/21 1734)   Diet Order             Diet Carb Modified Fluid consistency: Thin; Room service appropriate? Yes  Diet effective now                 Intake/Output  Intake/Output Summary (Last 24 hours) at 04/23/2021 1248 Last data filed at 04/23/2021 1228 Gross per 24 hour  Intake 1918.15 ml  Output --  Net 1918.15 ml   Intake/Output from previous day: 10/10 0701 - 10/11 0700 In: 476 [I.V.:226; IV Piggyback:250] Out: -  Net IO Since Admission: 2,506.23 mL [04/23/21 1248]   Weight change:   Wt Readings from Last 3 Encounters:  04/22/21 66 kg  05/07/20 68 kg  04/05/16 68 kg     Consultants:see note  Procedures:see note Antimicrobials: Anti-infectives (From admission, onward)    Start     Dose/Rate Route Frequency Ordered Stop    04/22/21 1800  azithromycin (ZITHROMAX) tablet 500 mg        500 mg Oral Every evening 04/22/21 1546     04/21/21 1800  azithromycin (ZITHROMAX) 500 mg in sodium chloride 0.9 % 250 mL IVPB  Status:  Discontinued        500 mg 250 mL/hr over 60 Minutes Intravenous Every 24 hours 04/20/21 2107 04/22/21 1546   04/21/21 1800  cefTRIAXone (ROCEPHIN) 1 g in sodium chloride 0.9 % 100 mL IVPB        1 g 200 mL/hr over 30 Minutes Intravenous Every 24 hours 04/20/21 2107     04/20/21 1730  cefTRIAXone (ROCEPHIN) 1 g in sodium chloride 0.9 % 100 mL IVPB        1 g 200 mL/hr over 30 Minutes Intravenous  Once 04/20/21 1722 04/20/21 1821   04/20/21 1730  azithromycin (ZITHROMAX) 500 mg in sodium chloride 0.9 % 250 mL IVPB        500 mg 250 mL/hr over 60 Minutes Intravenous  Once 04/20/21 1722 04/20/21 1846      Culture/Microbiology Other culture-see note  Unresulted Labs (From admission, onward)     Start     Ordered   04/22/21 0845  Occult blood card to lab, stool  ONCE - STAT,   STAT        04/22/21 0844   04/22/21 0500  Comprehensive metabolic panel  Daily,   R      04/21/21 0905   04/22/21 0500  CBC  Daily,   R      04/21/21 0905   04/20/21 2134  Strep pneumoniae urinary antigen  Once,   R        04/20/21 2133   04/20/21 2134  Legionella Pneumophila Serogp 1 Ur Ag  Once,   R        04/20/21 2133   04/20/21 2134  Urinalysis, Complete w Microscopic Urine, Random  Once,   R        04/20/21 2133   04/20/21 2134  Sodium, urine, random  Add-on,   AD        04/20/21 2133   04/20/21 2134  Creatinine, urine, random  Add-on,   AD  04/20/21 2133           Data Reviewed: I have personally reviewed following labs and imaging studies CBC: Recent Labs  Lab 04/20/21 1635 04/20/21 1648 04/21/21 0123 04/22/21 0111 04/23/21 0109  WBC 15.4*  --  13.2* 15.5* 12.7*  NEUTROABS 13.1*  --  12.1*  --   --   HGB 9.4* 9.5* 8.3* 7.6* 7.3*  HCT 28.1* 28.0* 24.7* 22.5* 22.8*  MCV 89.5  --   87.6 87.9 89.8  PLT 431*  --  374 371 553   Basic Metabolic Panel: Recent Labs  Lab 04/20/21 1948 04/21/21 0123 04/21/21 1105 04/22/21 0111 04/23/21 0109  NA 127* 129* 132* 132* 132*  K 3.9 4.0 4.1 4.1 4.4  CL 105 107 108 108 109  CO2 14* 12* 13* 16* 15*  GLUCOSE 159* 176* 237* 217* 255*  BUN $Re'18 16 19 23 'GOg$ 27*  CREATININE 1.43* 1.33* 1.32* 1.21* 1.17*  CALCIUM 7.6* 8.2* 8.4* 8.4* 8.3*  MG  --  2.1  2.2  --   --   --   PHOS  --  3.2  --   --   --    GFR: Estimated Creatinine Clearance: 51.9 mL/min (A) (by C-G formula based on SCr of 1.17 mg/dL (H)). Liver Function Tests: Recent Labs  Lab 04/20/21 1635 04/21/21 0123 04/22/21 0111 04/23/21 0109  AST $Re'18 17 21 'NLp$ 45*  ALT $Re'10 11 14 27  'gyT$ ALKPHOS 122 105 92 112  BILITOT 0.2* 0.6 0.1* 0.6  PROT 7.8 6.3* 6.0* 5.9*  ALBUMIN 3.4* 2.6* 2.3* 2.3*   No results for input(s): LIPASE, AMYLASE in the last 168 hours. No results for input(s): AMMONIA in the last 168 hours. Coagulation Profile: Recent Labs  Lab 04/21/21 0123  INR 1.2   Cardiac Enzymes: No results for input(s): CKTOTAL, CKMB, CKMBINDEX, TROPONINI in the last 168 hours. BNP (last 3 results) No results for input(s): PROBNP in the last 8760 hours. HbA1C: No results for input(s): HGBA1C in the last 72 hours. CBG: Recent Labs  Lab 04/22/21 0929 04/22/21 1253 04/22/21 1704 04/23/21 0915 04/23/21 1202  GLUCAP 223* 246* 203* 277* 308*   Lipid Profile: No results for input(s): CHOL, HDL, LDLCALC, TRIG, CHOLHDL, LDLDIRECT in the last 72 hours. Thyroid Function Tests: No results for input(s): TSH, T4TOTAL, FREET4, T3FREE, THYROIDAB in the last 72 hours. Anemia Panel: Recent Labs    04/21/21 1112 04/22/21 0111  VITAMINB12 241  --   FOLATE 5.2*  --   FERRITIN 247  --   TIBC 178*  --   IRON 17*  --   RETICCTPCT  --  1.8   Sepsis Labs: Recent Labs  Lab 04/20/21 1721 04/20/21 1858 04/21/21 0123 04/21/21 1014  PROCALCITON  --   --  0.27  --   LATICACIDVEN 1.5  1.2  --  1.2    Recent Results (from the past 240 hour(s))  Resp Panel by RT-PCR (Flu A&B, Covid) Nasopharyngeal Swab     Status: None   Collection Time: 04/20/21  4:38 PM   Specimen: Nasopharyngeal Swab; Nasopharyngeal(NP) swabs in vial transport medium  Result Value Ref Range Status   SARS Coronavirus 2 by RT PCR NEGATIVE NEGATIVE Final    Comment: (NOTE) SARS-CoV-2 target nucleic acids are NOT DETECTED.  The SARS-CoV-2 RNA is generally detectable in upper respiratory specimens during the acute phase of infection. The lowest concentration of SARS-CoV-2 viral copies this assay can detect is 138 copies/mL. A negative result does not preclude SARS-Cov-2  infection and should not be used as the sole basis for treatment or other patient management decisions. A negative result may occur with  improper specimen collection/handling, submission of specimen other than nasopharyngeal swab, presence of viral mutation(s) within the areas targeted by this assay, and inadequate number of viral copies(<138 copies/mL). A negative result must be combined with clinical observations, patient history, and epidemiological information. The expected result is Negative.  Fact Sheet for Patients:  EntrepreneurPulse.com.au  Fact Sheet for Healthcare Providers:  IncredibleEmployment.be  This test is no t yet approved or cleared by the [place] FDA and  has been authorized for detection and/or diagnosis of SARS-CoV-2 by FDA under an Emergency Use Authorization (EUA). This EUA will remain  in effect (meaning this test can be used) for the duration of the COVID-19 declaration under Section 564(b)(1) of the Act, 21 U.S.C.section 360bbb-3(b)(1), unless the authorization is terminated  or revoked sooner.       Influenza A by PCR NEGATIVE NEGATIVE Final   Influenza B by PCR NEGATIVE NEGATIVE Final    Comment: (NOTE) The Xpert Xpress SARS-CoV-2/FLU/RSV plus assay is  intended as an aid in the diagnosis of influenza from Nasopharyngeal swab specimens and should not be used as a sole basis for treatment. Nasal washings and aspirates are unacceptable for Xpert Xpress SARS-CoV-2/FLU/RSV testing.  Fact Sheet for Patients: EntrepreneurPulse.com.au  Fact Sheet for Healthcare Providers: IncredibleEmployment.be  This test is not yet approved or cleared by the [place] FDA and has been authorized for detection and/or diagnosis of SARS-CoV-2 by FDA under an Emergency Use Authorization (EUA). This EUA will remain in effect (meaning this test can be used) for the duration of the COVID-19 declaration under Section 564(b)(1) of the Act, 21 U.S.C. section 360bbb-3(b)(1), unless the authorization is terminated or revoked.  Performed at [facility], [address] [place], [place] [address]   Blood culture (routine x 2)     Status: None (Preliminary result)   Collection Time: 04/20/21  5:22 PM   Specimen: Right Antecubital; Blood  Result Value Ref Range Status   Specimen Description   Final    RIGHT ANTECUBITAL BLOOD Performed at [facility], [address] [place], [place] [address]    Special Requests   Final    Blood Culture adequate volume BOTTLES DRAWN AEROBIC AND ANAEROBIC Performed at [facility], [address] [place], [place] [address]    Culture   Final    NO GROWTH 3 DAYS Performed at [facility], [address], [place], [place] [address]    Report Status PENDING  Incomplete  Blood culture (routine x 2)     Status: None (Preliminary result)   Collection Time: 04/20/21  5:28 PM   Specimen: BLOOD LEFT WRIST  Result Value Ref Range Status   Specimen Description   Final    BLOOD LEFT WRIST BLOOD Performed at [facility], [address] [place], [place] [address]    Special Requests   Final    Blood Culture adequate volume BOTTLES DRAWN  AEROBIC AND ANAEROBIC Performed at [facility], [address] [place], [place] [address]    Culture   Final    NO GROWTH 3 DAYS Performed at [facility], [place] [address] [place], [place] [address]    Report Status PENDING  Incomplete     Radiology Studies: NM Pulmonary Perfusion  Result Date: 04/22/2021  CLINICAL DATA:  Respiratory failure.  Shortness of breath.  COPD. EXAM: NUCLEAR MEDICINE PERFUSION LUNG SCAN TECHNIQUE: Perfusion images were obtained in multiple projections after intravenous injection of radiopharmaceutical. Ventilation scans intentionally deferred if perfusion scan and chest x-ray adequate for interpretation during COVID 19 epidemic. RADIOPHARMACEUTICALS:  4.1 mCi Tc-11m MAA IV COMPARISON:  Chest x-ray today. FINDINGS: Vague patchy nonsegmental perfusion defects noted bilaterally, likely related to obstructive lung disease. No segmental or subsegmental wedge-shaped perfusion defects to suggest pulmonary embolus. IMPRESSION: No evidence of pulmonary embolus. Electronically Signed   By: Rolm Baptise M.D.   On: 04/22/2021 12:25   DG CHEST PORT 1 VIEW  Result Date: 04/22/2021 CLINICAL DATA:  Shortness of breath EXAM: PORTABLE CHEST 1 VIEW COMPARISON:  04/20/2021 FINDINGS: Heart is normal size. Diffuse interstitial prominence with hazy patchy opacities throughout the lungs. Findings could reflect edema or infection. Findings similar to prior study. No effusions. No acute bony abnormality. IMPRESSION: Interstitial prominence and patchy bilateral airspace disease, edema versus infection. Electronically Signed   By: Rolm Baptise M.D.   On: 04/22/2021 11:21   ECHOCARDIOGRAM COMPLETE  Result Date: 04/21/2021    ECHOCARDIOGRAM REPORT   Patient Name:   KATTLEYA KUHNERT Date of Exam: 04/21/2021 Medical Rec #:  [id number]  Height:       69.0 in Accession #:    [id number] Weight:       151.0 lb Date of Birth:  [birth year]  BSA:          1.833 m Patient Age:    73 years   BP:            134/46 mmHg Patient Gender: F          HR:           62 bpm. Exam Location:  Inpatient Procedure: 2D Echo, Cardiac Doppler and Color Doppler Indications:    Acutre respiratory distress R06.03  History:        Patient has no prior history of Echocardiogram examinations.                 Risk Factors:Diabetes and Hypertension.  Sonographer:    Bernadene Person RDCS Referring Phys: [id number] Delphos Calhoun IMPRESSIONS  1. Left ventricular ejection fraction, by estimation, is 60 to 65%. The left ventricle has normal function. The left ventricle has no regional wall motion abnormalities. Left ventricular diastolic parameters are consistent with Grade II diastolic dysfunction (pseudonormalization).  2. Right ventricular systolic function is normal. The right ventricular size is normal. There is normal pulmonary artery systolic pressure.  3. Left atrial size was mildly dilated.  4. The mitral valve is normal in structure. Mild to moderate mitral valve regurgitation. No evidence of mitral stenosis.  5. The aortic valve is normal in structure. Aortic valve regurgitation is not visualized. Mild aortic valve sclerosis is present, with no evidence of aortic valve stenosis.  6. The inferior vena cava is normal in size with greater than 50% respiratory variability, suggesting right atrial pressure of 3 mmHg. FINDINGS  Left Ventricle: Left ventricular ejection fraction, by estimation, is 60 to 65%. The left ventricle has normal function. The left ventricle has no regional wall motion abnormalities. The left ventricular internal cavity size was normal in size. There is  no left ventricular hypertrophy. Left ventricular diastolic parameters are consistent with Grade II diastolic dysfunction (pseudonormalization). Right Ventricle: The right ventricular size is normal. No increase in right ventricular wall thickness. Right ventricular systolic function is normal. There is normal pulmonary  artery systolic pressure. The tricuspid  regurgitant velocity is 1.87 m/s, and  with an assumed right atrial pressure of 8 mmHg, the estimated right ventricular systolic pressure is 12.4 mmHg. Left Atrium: Left atrial size was mildly dilated. Right Atrium: Right atrial size was normal in size. Pericardium: There is no evidence of pericardial effusion. Mitral Valve: The mitral valve is normal in structure. Mild mitral annular calcification. Mild to moderate mitral valve regurgitation. No evidence of mitral valve stenosis. Tricuspid Valve: The tricuspid valve is normal in structure. Tricuspid valve regurgitation is not demonstrated. No evidence of tricuspid stenosis. Aortic Valve: The aortic valve is normal in structure. Aortic valve regurgitation is not visualized. Mild aortic valve sclerosis is present, with no evidence of aortic valve stenosis. Pulmonic Valve: The pulmonic valve was normal in structure. Pulmonic valve regurgitation is not visualized. No evidence of pulmonic stenosis. Aorta: The aortic root is normal in size and structure. Venous: The inferior vena cava is normal in size with greater than 50% respiratory variability, suggesting right atrial pressure of 3 mmHg. IAS/Shunts: No atrial level shunt detected by color flow Doppler.  LEFT VENTRICLE PLAX 2D LVIDd:         4.20 cm   Diastology LVIDs:         2.20 cm   LV e' medial:    6.41 cm/s LV PW:         1.10 cm   LV E/e' medial:  18.3 LV IVS:        1.10 cm   LV e' lateral:   8.03 cm/s LVOT diam:     2.10 cm   LV E/e' lateral: 14.6 LV SV:         94 LV SV Index:   51 LVOT Area:     3.46 cm  RIGHT VENTRICLE RV S prime:     14.80 cm/s TAPSE (M-mode): 2.6 cm LEFT ATRIUM             Index        RIGHT ATRIUM           Index LA diam:        3.10 cm 1.69 cm/m   RA Area:     13.30 cm LA Vol (A2C):   61.1 ml 33.33 ml/m  RA Volume:   29.40 ml  16.04 ml/m LA Vol (A4C):   59.1 ml 32.24 ml/m LA Biplane Vol: 60.1 ml 32.78 ml/m  AORTIC VALVE LVOT Vmax:   112.00 cm/s LVOT Vmean:  73.200 cm/s LVOT VTI:     0.272 m  AORTA Ao Root diam: 3.50 cm Ao Asc diam:  3.60 cm MITRAL VALVE                  TRICUSPID VALVE MV Area (PHT): 3.12 cm       TR Peak grad:   14.0 mmHg MV Decel Time: 243 msec       TR Vmax:        187.00 cm/s MR Peak grad:    112.8 mmHg MR Mean grad:    84.0 mmHg    SHUNTS MR Vmax:         531.00 cm/s  Systemic VTI:  0.27 m MR Vmean:        440.0 cm/s   Systemic Diam: 2.10 cm MR PISA:         1.01 cm MR PISA Eff ROA: 7 mm MR PISA Radius:  0.40 cm MV E velocity: 117.00 cm/s MV A  velocity: 100.00 cm/s MV E/A ratio:  1.17 Mihai Croitoru MD Electronically signed by Sanda Klein MD Signature Date/Time: 04/21/2021/3:22:03 PM    Final    VAS [place] LOWER EXTREMITY VENOUS (DVT)  Result Date: 04/22/2021  Lower Venous DVT Study Patient Name:  TARRIN MENN  Date of Exam:   04/21/2021 Medical Rec #: [id number]   Accession #:    [id number] Date of Birth: 31-Jul-1960   Patient Gender: F Patient Age:   4 years Exam Location:  [facility] Procedure:      VAS [place] LOWER EXTREMITY VENOUS (DVT) Referring Phys: [place] --------------------------------------------------------------------------------  Indications: Elevated d-dimer.  Comparison Study: No prior study Performing Technologist: Maudry Mayhew MHA, RDMS, RVT, RDCS  Examination Guidelines: A complete evaluation includes B-mode imaging, spectral Doppler, color Doppler, and power Doppler as needed of all accessible portions of each vessel. Bilateral testing is considered an integral part of a complete examination. Limited examinations for reoccurring indications may be performed as noted. The reflux portion of the exam is performed with the patient in reverse Trendelenburg.  +---------+---------------+---------+-----------+----------+--------------+ RIGHT    CompressibilityPhasicitySpontaneityPropertiesThrombus Aging +---------+---------------+---------+-----------+----------+--------------+ CFV      Full           Yes      Yes                                  +---------+---------------+---------+-----------+----------+--------------+ SFJ      Full                                                        +---------+---------------+---------+-----------+----------+--------------+ FV Prox  Full                                                        +---------+---------------+---------+-----------+----------+--------------+ FV Mid   Full                                                        +---------+---------------+---------+-----------+----------+--------------+ FV DistalFull                                                        +---------+---------------+---------+-----------+----------+--------------+ PFV      Full                                                        +---------+---------------+---------+-----------+----------+--------------+ POP      Full           Yes      Yes                                 +---------+---------------+---------+-----------+----------+--------------+  PTV      Full                                                        +---------+---------------+---------+-----------+----------+--------------+ PERO     Full                                                        +---------+---------------+---------+-----------+----------+--------------+   +---------+---------------+---------+-----------+----------+--------------+ LEFT     CompressibilityPhasicitySpontaneityPropertiesThrombus Aging +---------+---------------+---------+-----------+----------+--------------+ CFV      Full           Yes      Yes                                 +---------+---------------+---------+-----------+----------+--------------+ SFJ      Full                                                        +---------+---------------+---------+-----------+----------+--------------+ FV Prox  Full                                                         +---------+---------------+---------+-----------+----------+--------------+ FV Mid   Full                                                        +---------+---------------+---------+-----------+----------+--------------+ FV DistalFull                                                        +---------+---------------+---------+-----------+----------+--------------+ PFV      Full                                                        +---------+---------------+---------+-----------+----------+--------------+ POP      Full           Yes      Yes                                 +---------+---------------+---------+-----------+----------+--------------+ PTV      Full                                                        +---------+---------------+---------+-----------+----------+--------------+  PERO     Full                                                        +---------+---------------+---------+-----------+----------+--------------+     Summary: BILATERAL: - No evidence of deep vein thrombosis seen in the lower extremities, bilaterally. -No evidence of popliteal cyst, bilaterally.   *See table(s) above for measurements and observations. Electronically signed by Orlie Pollen on 04/22/2021 at 6:48:39 AM.    Final      LOS: 2 days   Antonieta Pert, MD [facility]  04/23/2021, 12:48 PM

## 2021-04-24 LAB — BASIC METABOLIC PANEL
Anion gap: 7 (ref 5–15)
BUN: 22 mg/dL (ref 8–23)
CO2: 19 mmol/L — ABNORMAL LOW (ref 22–32)
Calcium: 8.3 mg/dL — ABNORMAL LOW (ref 8.9–10.3)
Chloride: 107 mmol/L (ref 98–111)
Creatinine, Ser: 1.05 mg/dL — ABNORMAL HIGH (ref 0.44–1.00)
GFR, Estimated: >60 mL/min (ref >60)
Glucose, Bld: 94 mg/dL (ref 70–99)
Potassium: 3.6 mmol/L (ref 3.5–5.1)
Sodium: 133 mmol/L — ABNORMAL LOW (ref 135–145)

## 2021-04-24 LAB — HEMOGLOBIN A1C
Hgb A1c MFr Bld: 9 % — ABNORMAL HIGH (ref 4.8–5.6)
Mean Plasma Glucose: 211.6 mg/dL

## 2021-04-24 LAB — CBC
HCT: 24.2 % — ABNORMAL LOW (ref 36.0–46.0)
Hemoglobin: 8 g/dL — ABNORMAL LOW (ref 12.0–15.0)
MCH: 29.2 pg (ref 26.0–34.0)
MCHC: 33.1 g/dL (ref 30.0–36.0)
MCV: 88.3 fL (ref 80.0–100.0)
Platelets: 428 10*3/uL — ABNORMAL HIGH (ref 150–400)
RBC: 2.74 MIL/uL — ABNORMAL LOW (ref 3.87–5.11)
RDW: 14.1 % (ref 11.5–15.5)
WBC: 12.4 10*3/uL — ABNORMAL HIGH (ref 4.0–10.5)
nRBC: 0 % (ref 0.0–0.2)

## 2021-04-24 LAB — GLUCOSE, CAPILLARY
Glucose-Capillary: 89 mg/dL (ref 70–99)
Glucose-Capillary: 92 mg/dL (ref 70–99)
Glucose-Capillary: 260 mg/dL — ABNORMAL HIGH (ref 70–99)
Glucose-Capillary: 267 mg/dL — ABNORMAL HIGH (ref 70–99)
Glucose-Capillary: 163 mg/dL — ABNORMAL HIGH (ref 70–99)

## 2021-04-24 LAB — STREP PNEUMONIAE URINARY ANTIGEN: Strep Pneumo Urinary Antigen: NEGATIVE

## 2021-04-24 MED ORDER — INSULIN GLARGINE-YFGN 100 UNIT/ML ~~LOC~~ SOLN
10.0000 [IU] | Freq: Every day | SUBCUTANEOUS | Status: DC
  Administered 2021-04-24 – 2021-04-25 (×2): 10 [IU] via SUBCUTANEOUS
  Filled 2021-04-24 (×2): qty 0.1

## 2021-04-24 NOTE — Evaluation (Signed)
Occupational Therapy Evaluation Patient Details Name: Michelle Nicholson MRN: [id number] DOB: 07-13-1961 Today's Date: 04/24/2021   History of Present Illness Pt is a 60 y.o. F who presents with progressive SOB, cough, subjective fever and chills. Pt found to have severe sepsis due to pneumonia with associated leukocytosis, tachypnea. Signifiacnt PMH: chronic hyponatremia, T2DM, HTN, chronic anemia.   Clinical Impression   PTA patient was living alone in a private residence and was grossly I with ADLs/IADLs without AD. Patient currently functioning close to baseline demonstrating observed ADLs including toileting and UB/LB dressing with Mod I to I. Patient limited by deficits listed below including decreased activity tolerance and decreased cardiopulmonary status. Written and verbal education provided on energy conservation techniques. Patient expressed verbal understanding. Patient does not require continued acute occupational therapy services at this time with OT to sign off.    Recommendations for follow up therapy are one component of a multi-disciplinary discharge planning process, led by the attending physician.  Recommendations may be updated based on patient status, additional functional criteria and insurance authorization.   Follow Up Recommendations  No OT follow up    Equipment Recommendations  None recommended by OT    Recommendations for Other Services       Precautions / Restrictions Precautions Precautions: Other (comment) Precaution Comments: monitor o2 sats Restrictions Weight Bearing Restrictions: No      Mobility Bed Mobility Overal bed mobility: Independent                  Transfers Overall transfer level: Independent Equipment used: None                  Balance Overall balance assessment: No apparent balance deficits (not formally assessed)                                         ADL either performed or assessed with clinical  judgement   ADL Overall ADL's : Modified independent                                             Vision   Vision Assessment?: No apparent visual deficits     Perception     Praxis      Pertinent Vitals/Pain Pain Assessment: No/denies pain     Hand Dominance     Extremity/Trunk Assessment Upper Extremity Assessment Upper Extremity Assessment: Overall WFL for tasks assessed   Lower Extremity Assessment Lower Extremity Assessment: Defer to PT evaluation   Cervical / Trunk Assessment Cervical / Trunk Assessment: Normal   Communication Communication Communication: No difficulties   Cognition Arousal/Alertness: Awake/alert Behavior During Therapy: WFL for tasks assessed/performed Overall Cognitive Status: Within Functional Limits for tasks assessed                                     General Comments  pt on RA upon arrival, sats from 87-88%. Pt continues to desat with mobility even short distances in hospital room.    Exercises     Shoulder Instructions      Home Living Family/patient expects to be discharged to:: Private residence Living Arrangements: Alone Available Help at Discharge: Family Type of Home: House Home Access: Stairs  to enter Entrance Stairs-Number of Steps: 3 Entrance Stairs-Rails: Left Home Layout: One level     Bathroom Shower/Tub: Walk-in shower         Home Equipment: Shower seat - built in;Walker - 2 wheels;Cane - single point          Prior Functioning/Environment Level of Independence: Independent        Comments: Retired Therapist, nutritional Problem List:        OT Treatment/Interventions:      OT Goals(Current goals can be found in the care plan section) Acute Rehab OT Goals Patient Stated Goal: return to baseline OT Goal Formulation: With patient  OT Frequency:     Barriers to D/C:            Co-evaluation              AM-PAC OT "6 Clicks" Daily Activity      Outcome Measure Help from another person eating meals?: None Help from another person taking care of personal grooming?: None Help from another person toileting, which includes using toliet, bedpan, or urinal?: None Help from another person bathing (including washing, rinsing, drying)?: None Help from another person to put on and taking off regular upper body clothing?: None Help from another person to put on and taking off regular lower body clothing?: None 6 Click Score: 24   End of Session Nurse Communication: Mobility status  Activity Tolerance: Patient tolerated treatment well;Patient limited by fatigue Patient left: Other (comment) (Seated EOB)                   Time: 2878-6767 OT Time Calculation (min): 14 min Charges:  OT General Charges $OT Visit: 1 Visit OT Evaluation $OT Eval Low Complexity: 1 Low  Michelle Nicholson H. OTR/L Supplemental OT, Department of rehab services [phone]  Michelle Whittier R H. 04/24/2021, 12:43 PM

## 2021-04-24 NOTE — Progress Notes (Signed)
PROGRESS NOTE    Michelle Nicholson  QZE:[id number] DOB: [birth year] DOA: 04/20/2021 PCP: [facility]   Chief Complaint  Patient presents with   Shortness of Breath  Brief Narrative/Hospital Course: 60 year old female with T2DM, HTN, hypothyroidism chronic hyponatremia 127-131, chronic anemia 10-11 0.5 presented to the [place] with 3 to 4 days of progressive shortness of breath, new onset of productive cough, subjective fever and chills. In the ED fever 100.2, rest of the vitals fairly stable hypoxic at 89% room air needing 5 L nasal cannula saturating 94 to 92%. VBG notable for 7.249/32.  CMP Sodium 123, which is corrected to approximately 125 taking into account concomitant hyperglycemia, potassium 3.9, bicarbonate 14, anion gap 11, BUN 21, creatinine 1.50 relative to most recent prior value 0.95 in July 2022, glucose 228, and liver enzymes were found to be within normal limits.  BNP 165, leukocytosis 15.4, hemoglobin 9.4 lactic acid 1.5 COVID-19 and influenza PCR negative blood cultures x2 were sent placed on ceftriaxone azithromycin EKG no overt T wave or ST changes chest x-ray HCA airspace opacities in the bilateral lower lobe.  Patient was found to have pneumonia severe sepsis and acute hypoxic respiratory failure admitted  Subjective: Overnight afebrile.  Yesterday she was able to ambulate with PT, has been off oxygen. Hypoglycemic this morning.  Eating her breakfast.  Reports she is doing okay while on the bed but get extremely fatigued tired and also has shortness of breath while moving in the room  Assessment & Plan:  Severe sepsis POA due to pneumonia Bilateral pneumonia: Met severe sepsis criteria with pneumonia, leukocytosis, tachypnea in 36 on admission.BNP normal troponin negative.blood culture no growth so far.  She is clinically improving-stop steroids today. continue ceftriaxone/azithromycin, incentive spirometry , bronchodilators.  Encourage  ambulation Recent Labs  Lab 04/20/21 1635 04/20/21 1721 04/20/21 1858 04/21/21 0123 04/21/21 1014 04/22/21 0111 04/23/21 0109 04/24/21 0135  WBC 15.4*  --   --  13.2*  --  15.5* 12.7* 12.4*  LATICACIDVEN  --  1.5 1.2  --  1.2  --   --   --   PROCALCITON  --   --   --  0.27  --   --   --   --     Acute hypoxic respiratory failure in the setting of pneumonia.D Dimer  mildly up at 1.1- VQ scan was normal, duplex negative and echo with no right heart dysfunction, normal EF.  Able to come off oxygen.  Ambulated with PT briefly desaturated in 87%  but rebounded quickly after a short standing break.  Continue incentive spirometry , bronchodilators.    Longstanding smoking history ?suspect underlying COPD:Continue bronchodilators, continue Pulmicort nebulizer, and steroid . Will need PFT outpatient and pulmonary follow-up. She says her PCP had given disk inhaler but could not take powder- sounds like advair.  AKI: Due to sepsis.  Resolved.  Off IV fluids, encourage p.o.   Recent Labs    05/07/20 1946 04/20/21 1635 04/20/21 1948 04/21/21 0123 04/21/21 1105 04/22/21 0111 04/23/21 0109 04/24/21 0135  BUN _0 27* 22  CREATININE 0.92 1.50* 1.43* 1.33* 1.32* 1.21* 1.17* 1.05*    Acute on chronic metabolic acidosis bicarb on admission 14, as low as 12-level improving on oral bicarb. She is on long-term methazolamide. Previous bicarb was in 6, 10/252021.  Here has normal lactate level.  Chronic hyponatremia: stable.She takes 1 g salt tablets 3 times daily as needed for "brain fog".  This could be in the setting of her  methalazomide use (but she reports she had hyponatremia even before this med). Recent Labs  Lab 04/21/21 0123 04/21/21 1105 04/22/21 0111 04/23/21 0109 04/24/21 0135  NA 129* 132* 132* 132* 133*    T2dm with neuropathy with long-term Tresiba: Blood sugar fluctuating/brittle-blood sugar dropping likely after backing off on steroid.  Cut down on Lantus at 10  units  and sliding scale.( on 16 u tresiba at home). No results found for: HGBA1C  Recent Labs  Lab 04/23/21 1202 04/23/21 1702 04/23/21 2110 04/24/21 0735 04/24/21 0858  GLUCAP 308* 64* 319* 89 92    Essential hypertension: Well-controlled.  Holding  home losartan a continue amlodipine for now.  PVD with angioplasty of her legs last year- cont her asa 4, plavix and statins  Hypothyroidism: Continue her thyroid supplement  Chronic tobaccos use: Cessation advised  Glaucoma  had eye surgery 2 wks ago- resumed prednisone and latanaprost and her  methalazomide po.  Anxiety/depression:cont her Wellbutrin/Zoloft.  Chronic anemia Folate deficiency B12 insufficiency iron deficiency: Previous baseline hemoglobin 9-10.  Hb downtrended to 7.3- anemia panel showed B12 is borderline, folic acid is < 5.2, iron low-continue b12, folate and iron supplement.  She has had previous scope in the past, advised outpatient follow-up PCP/GI and CBC check in 1  Recent Labs  Lab 04/20/21 1648 04/21/21 0123 04/22/21 0111 04/23/21 0109 04/24/21 0135  HGB 9.5* 8.3* 7.6* 7.3* 8.0*  HCT 28.0* 24.7* 22.5* 22.8* 24.2*    DVT prophylaxis: SCDs Start: 04/20/21 2102 caution w/ chemical prophylaxis due to anemia, already on aspirin Plavix Code Status:   Code Status: Full Code Family Communication: plan of care discussed with patient at bedside.  Status is: inpatient Patient remains hospitalized for ongoing management of hypoxic respiratory failure and bilateral pneumonia and sepsis Dispo: The patient is from: Home              Anticipated d/c is to: Home.  Anticipating discharge tomorrow.  Seen by PT OT no PT needed.                Patient currently is not medically stable to d/c.   Difficult to place patient No Objective: Vitals: Today's Vitals   04/23/21 1944 04/23/21 2300 04/24/21 0519 04/24/21 0737  BP: (!) 154/59  (!) 163/61 (!) 149/59  Pulse: 76  85 78  Resp: _0 Temp: 98 F (36.7 C)   98.9 F (37.2 C) 98.6 F (37 C)  TempSrc: Oral  Oral Oral  SpO2: 96%  93%   Weight:      Height:      PainSc:  0-No pain     Physical Examination: General exam: AAOx 3, weak fatigued, not in distress.  On room air.  HEENT:Oral mucosa moist, Ear/Nose WNL grossly, dentition normal. Respiratory system: bilaterally air entry present decreased at the base, no use of accessory muscle Cardiovascular system: S1 & S2 +, No JVD,. Gastrointestinal system: Abdomen soft, NT,ND, BS+ Nervous System:Alert, awake, moving extremities and grossly nonfocal Extremities: No edema, distal peripheral pulses palpable.  Skin: No rashes,no icterus. MSK: Normal muscle bulk,tone, power   Medications reviewed:  Scheduled Meds:  aspirin EC  81 mg Oral Daily   atorvastatin  40 mg Oral q morning   azithromycin  500 mg Oral QPM   buPROPion  150 mg Oral q morning   clopidogrel  75 mg Oral q morning   ferrous sulfate  325 mg Oral  BID WC   folic acid  1 mg Oral Daily   insulin aspart  0-9 Units Subcutaneous TID WC   insulin glargine-yfgn  10 Units Subcutaneous Daily   latanoprost  1 drop Both Eyes QHS   levothyroxine  50 mcg Oral QAC breakfast   methazolamide  50 mg Oral BID   multivitamin with minerals  1 tablet Oral q morning   prednisoLONE acetate  1 drop Right Eye QID   pregabalin  300 mg Oral QHS   sertraline  100 mg Oral Daily   sodium bicarbonate  1,300 mg Oral BID   umeclidinium bromide  1 puff Inhalation Daily   vitamin B-12  250 mcg Oral Daily   Continuous Infusions:  cefTRIAXone (ROCEPHIN)  IV 1 g (04/23/21 1806)   Diet Order             Diet Carb Modified Fluid consistency: Thin; Room service appropriate? Yes  Diet effective now                 Intake/Output  Intake/Output Summary (Last 24 hours) at 04/24/2021 0950 Last data filed at 04/24/2021 0900 Gross per 24 hour  Intake 1922.18 ml  Output --  Net 1922.18 ml   Intake/Output from previous day: 10/11 0701 - 10/12 0700 In:  1922.2 [P.O.:940; I.V.:882.2; IV Piggyback:100] Out: -  Net IO Since Admission: 3,206.23 mL [04/24/21 0950]   Weight change:   Wt Readings from Last 3 Encounters:  04/22/21 66 kg  05/07/20 68 kg  04/05/16 68 kg     Consultants:see note  Procedures:see note Antimicrobials: Anti-infectives (From admission, onward)    Start     Dose/Rate Route Frequency Ordered Stop   04/22/21 1800  azithromycin (ZITHROMAX) tablet 500 mg        500 mg Oral Every evening 04/22/21 1546     04/21/21 1800  azithromycin (ZITHROMAX) 500 mg in sodium chloride 0.9 % 250 mL IVPB  Status:  Discontinued        500 mg 250 mL/hr over 60 Minutes Intravenous Every 24 hours 04/20/21 2107 04/22/21 1546   04/21/21 1800  cefTRIAXone (ROCEPHIN) 1 g in sodium chloride 0.9 % 100 mL IVPB        1 g 200 mL/hr over 30 Minutes Intravenous Every 24 hours 04/20/21 2107     04/20/21 1730  cefTRIAXone (ROCEPHIN) 1 g in sodium chloride 0.9 % 100 mL IVPB        1 g 200 mL/hr over 30 Minutes Intravenous  Once 04/20/21 1722 04/20/21 1821   04/20/21 1730  azithromycin (ZITHROMAX) 500 mg in sodium chloride 0.9 % 250 mL IVPB        500 mg 250 mL/hr over 60 Minutes Intravenous  Once 04/20/21 1722 04/20/21 1846      Culture/Microbiology Other culture-see note  Unresulted Labs (From admission, onward)     Start     Ordered   04/24/21 0950  Hemoglobin A1c  Add-on,   AD        04/24/21 0949   04/24/21 0500  Basic metabolic panel  Daily,   R     Question:  Specimen collection method  Answer:  Lab=Lab collect   04/23/21 1254   04/23/21 1255  Legionella Pneumophila Serogp 1 Ur Ag  Once,   R        04/23/21 1254   04/23/21 1255  Strep pneumoniae urinary antigen  Once,   R        04/23/21 1254     04/22/21 0845  Occult blood card to lab, stool  ONCE - STAT,   STAT        04/22/21 0844   04/20/21 2134  Urinalysis, Complete w Microscopic Urine, Random  Once,   R        04/20/21 2133   04/20/21 2134  Creatinine, urine, random  Add-on,    AD        04/20/21 2133           Data Reviewed: I have personally reviewed following labs and imaging studies CBC: Recent Labs  Lab 04/20/21 1635 04/20/21 1648 04/21/21 0123 04/22/21 0111 04/23/21 0109 04/24/21 0135  WBC 15.4*  --  13.2* 15.5* 12.7* 12.4*  NEUTROABS 13.1*  --  12.1*  --   --   --   HGB 9.4* 9.5* 8.3* 7.6* 7.3* 8.0*  HCT 28.1* 28.0* 24.7* 22.5* 22.8* 24.2*  MCV 89.5  --  87.6 87.9 89.8 88.3  PLT 431*  --  374 371 391 428*   Basic Metabolic Panel: Recent Labs  Lab 04/21/21 0123 04/21/21 1105 04/22/21 0111 04/23/21 0109 04/24/21 0135  NA 129* 132* 132* 132* 133*  K 4.0 4.1 4.1 4.4 3.6  CL 107 108 108 109 107  CO2 12* 13* 16* 15* 19*  GLUCOSE 176* 237* 217* 255* 94  BUN 16 19 23 27* 22  CREATININE 1.33* 1.32* 1.21* 1.17* 1.05*  CALCIUM 8.2* 8.4* 8.4* 8.3* 8.3*  MG 2.1  2.2  --   --   --   --   PHOS 3.2  --   --   --   --    GFR: Estimated Creatinine Clearance: 57.9 mL/min (A) (by C-G formula based on SCr of 1.05 mg/dL (H)). Liver Function Tests: Recent Labs  Lab 04/20/21 1635 04/21/21 0123 04/22/21 0111 04/23/21 0109  AST 18 17 21 45*  ALT 10 11 14 27  ALKPHOS 122 105 92 112  BILITOT 0.2* 0.6 0.1* 0.6  PROT 7.8 6.3* 6.0* 5.9*  ALBUMIN 3.4* 2.6* 2.3* 2.3*   No results for input(s): LIPASE, AMYLASE in the last 168 hours. No results for input(s): AMMONIA in the last 168 hours. Coagulation Profile: Recent Labs  Lab 04/21/21 0123  INR 1.2   Cardiac Enzymes: No results for input(s): CKTOTAL, CKMB, CKMBINDEX, TROPONINI in the last 168 hours. BNP (last 3 results) No results for input(s): PROBNP in the last 8760 hours. HbA1C: No results for input(s): HGBA1C in the last 72 hours. CBG: Recent Labs  Lab 04/23/21 1202 04/23/21 1702 04/23/21 2110 04/24/21 0735 04/24/21 0858  GLUCAP 308* 64* 319* 89 92   Lipid Profile: No results for input(s): CHOL, HDL, LDLCALC, TRIG, CHOLHDL, LDLDIRECT in the last 72 hours. Thyroid Function  Tests: No results for input(s): TSH, T4TOTAL, FREET4, T3FREE, THYROIDAB in the last 72 hours. Anemia Panel: Recent Labs    04/21/21 1112 04/22/21 0111  VITAMINB12 241  --   FOLATE 5.2*  --   FERRITIN 247  --   TIBC 178*  --   IRON 17*  --   RETICCTPCT  --  1.8   Sepsis Labs: Recent Labs  Lab 04/20/21 1721 04/20/21 1858 04/21/21 0123 04/21/21 1014  PROCALCITON  --   --  0.27  --   LATICACIDVEN 1.5 1.2  --  1.2    Recent Results (from the past 240 hour(s))  Resp Panel by RT-PCR (Flu A&B, Covid) Nasopharyngeal Swab     Status: None   Collection Time: 04/20/21    4:38 PM   Specimen: Nasopharyngeal Swab; Nasopharyngeal(NP) swabs in vial transport medium  Result Value Ref Range Status   SARS Coronavirus 2 by RT PCR NEGATIVE NEGATIVE Final    Comment: (NOTE) SARS-CoV-2 target nucleic acids are NOT DETECTED.  The SARS-CoV-2 RNA is generally detectable in upper respiratory specimens during the acute phase of infection. The lowest concentration of SARS-CoV-2 viral copies this assay can detect is 138 copies/mL. A negative result does not preclude SARS-Cov-2 infection and should not be used as the sole basis for treatment or other patient management decisions. A negative result may occur with  improper specimen collection/handling, submission of specimen other than nasopharyngeal swab, presence of viral mutation(s) within the areas targeted by this assay, and inadequate number of viral copies(<138 copies/mL). A negative result must be combined with clinical observations, patient history, and epidemiological information. The expected result is Negative.  Fact Sheet for Patients:  https://www.fda.gov/media/152166/download  Fact Sheet for Healthcare Providers:  https://www.fda.gov/media/152162/download  This test is no t yet approved or cleared by the [place] FDA and  has been authorized for detection and/or diagnosis of SARS-CoV-2 by FDA under an Emergency Use  Authorization (EUA). This EUA will remain  in effect (meaning this test can be used) for the duration of the COVID-19 declaration under Section 564(b)(1) of the Act, 21 U.S.C.section 360bbb-3(b)(1), unless the authorization is terminated  or revoked sooner.       Influenza A by PCR NEGATIVE NEGATIVE Final   Influenza B by PCR NEGATIVE NEGATIVE Final    Comment: (NOTE) The Xpert Xpress SARS-CoV-2/FLU/RSV plus assay is intended as an aid in the diagnosis of influenza from Nasopharyngeal swab specimens and should not be used as a sole basis for treatment. Nasal washings and aspirates are unacceptable for Xpert Xpress SARS-CoV-2/FLU/RSV testing.  Fact Sheet for Patients: https://www.fda.gov/media/152166/download  Fact Sheet for Healthcare Providers: https://www.fda.gov/media/152162/download  This test is not yet approved or cleared by the [place] FDA and has been authorized for detection and/or diagnosis of SARS-CoV-2 by FDA under an Emergency Use Authorization (EUA). This EUA will remain in effect (meaning this test can be used) for the duration of the COVID-19 declaration under Section 564(b)(1) of the Act, 21 U.S.C. section 360bbb-3(b)(1), unless the authorization is terminated or revoked.  Performed at [facility] [place], [address] [place], [place] [address]   Blood culture (routine x 2)     Status: None (Preliminary result)   Collection Time: 04/20/21  5:22 PM   Specimen: Right Antecubital; Blood  Result Value Ref Range Status   Specimen Description   Final    RIGHT ANTECUBITAL BLOOD Performed at [facility] [place], [address] [place], [place] [address]    Special Requests   Final    Blood Culture adequate volume BOTTLES DRAWN AEROBIC AND ANAEROBIC Performed at [facility] [place], [address] [place], [place] [address]    Culture   Final    NO GROWTH 4 DAYS Performed at [facility], [address] [place], [place]  [address]    Report Status PENDING  Incomplete  Blood culture (routine x 2)     Status: None (Preliminary result)   Collection Time: 04/20/21  5:28 PM   Specimen: BLOOD LEFT WRIST  Result Value Ref Range Status   Specimen Description   Final    BLOOD LEFT WRIST BLOOD Performed at [facility] [place], [address] [place], [place] [address]      Special Requests   Final    Blood Culture adequate volume BOTTLES DRAWN AEROBIC AND ANAEROBIC Performed at [facility] [place], [address] [place], [place] [address]    Culture   Final    NO GROWTH 4 DAYS Performed at [facility], [address] [place], [place] [address]    Report Status PENDING  Incomplete     Radiology Studies: NM Pulmonary Perfusion  Result Date: 04/22/2021 CLINICAL DATA:  Respiratory failure.  Shortness of breath.  COPD. EXAM: NUCLEAR MEDICINE PERFUSION LUNG SCAN TECHNIQUE: Perfusion images were obtained in multiple projections after intravenous injection of radiopharmaceutical. Ventilation scans intentionally deferred if perfusion scan and chest x-ray adequate for interpretation during COVID 19 epidemic. RADIOPHARMACEUTICALS:  4.1 mCi Tc-99m MAA IV COMPARISON:  Chest x-ray today. FINDINGS: Vague patchy nonsegmental perfusion defects noted bilaterally, likely related to obstructive lung disease. No segmental or subsegmental wedge-shaped perfusion defects to suggest pulmonary embolus. IMPRESSION: No evidence of pulmonary embolus. Electronically Signed   By: Kevin  Dover M.D.   On: 04/22/2021 12:25     LOS: 3 days   Ramesh KC, MD [facility]  04/24/2021, 9:50 AM    

## 2021-04-24 NOTE — Progress Notes (Signed)
Physical Therapy Treatment Patient Details Name: Michelle Nicholson MRN: [id number] DOB: [birth year] Today's Date: 04/24/2021   History of Present Illness Pt is a 60 y.o. F who presents with progressive SOB, cough, subjective fever and chills. Pt found to have severe sepsis due to pneumonia with associated leukocytosis, tachypnea. Signifiacnt PMH: chronic hyponatremia, T2DM, HTN, chronic anemia.    PT Comments    Pt remains limited by SOB and fatigue when mobilizing, although tolerates increased ambulation compared to previous session. Pt is unable to maintain oxygen sats at rest or when mobilizing on room air at this time. PT provides 3L of supplemental oxygen with continued desaturation when ambulating. PT provides education on energy conservation strategies. Pt will benefit from continued acute PT services to aide in improving tolerance for community mobility.   Recommendations for follow up therapy are one component of a multi-disciplinary discharge planning process, led by the attending physician.  Recommendations may be updated based on patient status, additional functional criteria and insurance authorization.  Follow Up Recommendations  No PT follow up;Supervision - Intermittent     Equipment Recommendations  None recommended by PT    Recommendations for Other Services       Precautions / Restrictions Precautions Precautions: Other (comment) Precaution Comments: monitor o2 sats Restrictions Weight Bearing Restrictions: No     Mobility  Bed Mobility Overal bed mobility: Independent                  Transfers Overall transfer level: Independent                  Ambulation/Gait Ambulation/Gait assistance: Independent Gait Distance (Feet): 200 Feet (200' x 2) Assistive device: None Gait Pattern/deviations: Step-through pattern Gait velocity: functional Gait velocity interpretation: >2.62 ft/sec, indicative of community ambulatory General Gait Details: pt with  steady step-through gait, seated rest break due to SOB   Stairs Stairs: Yes Stairs assistance: Modified independent (Device/Increase time) Stair Management: One rail Left;Alternating pattern Number of Stairs: 4     Wheelchair Mobility    Modified Rankin (Stroke Patients Only)       Balance Overall balance assessment: No apparent balance deficits (not formally assessed)                                          Cognition Arousal/Alertness: Awake/alert Behavior During Therapy: WFL for tasks assessed/performed Overall Cognitive Status: Within Functional Limits for tasks assessed                                        Exercises      General Comments General comments (skin integrity, edema, etc.): pt on RA upon arrival, sats from 87-88%. Pt continues to desat with mobility on RA to low 80s. PT places pt on 3L [place] with desat to mid 80s when ambulating. Pt requires 2 minutes to recover when seated and resting on 3L [address] [place]. At end of session pt again desats to 90% on RA. Pt left with OT at end of session.      Pertinent Vitals/Pain Pain Assessment: No/denies pain    Home Living                      Prior Function            PT  Goals (current goals can now be found in the care plan section) Acute Rehab PT Goals Patient Stated Goal: return to baseline Additional Goals Additional Goal #1: Pt will report 3/10 DOE or less when ambulating for >400' on room air to demonstrate improved activity tolerance Progress towards PT goals: Progressing toward goals    Frequency    Min 3X/week      PT Plan Current plan remains appropriate    Co-evaluation              AM-PAC PT "6 Clicks" Mobility   Outcome Measure  Help needed turning from your back to your side while in a flat bed without using bedrails?: None Help needed moving from lying on your back to sitting on the side of a flat bed without using bedrails?: None Help needed  moving to and from a bed to a chair (including a wheelchair)?: None Help needed standing up from a chair using your arms (e.g., wheelchair or bedside chair)?: None Help needed to walk in hospital room?: None Help needed climbing 3-5 steps with a railing? : None 6 Click Score: 24    End of Session Equipment Utilized During Treatment: Oxygen Activity Tolerance: Patient limited by fatigue Patient left: in bed;with call bell/phone within reach Nurse Communication: Mobility status PT Visit Diagnosis: Difficulty in walking, not elsewhere classified (R26.2)     Time: 5974-1638 PT Time Calculation (min) (ACUTE ONLY): 23 min  Charges:  $Gait Training: 8-22 mins $Therapeutic Activity: 8-22 mins                     Zenaida Niece, PT, DPT Acute Rehabilitation Pager: [phone]    Zenaida Niece 04/24/2021, 12:28 PM

## 2021-04-25 ENCOUNTER — Other Ambulatory Visit (HOSPITAL_COMMUNITY): Payer: Self-pay

## 2021-04-25 ENCOUNTER — Other Ambulatory Visit: Payer: Self-pay

## 2021-04-25 ENCOUNTER — Emergency Department (HOSPITAL_BASED_OUTPATIENT_CLINIC_OR_DEPARTMENT_OTHER)
Admission: EM | Admit: 2021-04-25 | Discharge: 2021-04-26 | Disposition: A | Discharge status: Home / Self Care | Payer: BC Managed Care – PPO | Attending: Emergency Medicine | Admitting: Emergency Medicine

## 2021-04-25 DIAGNOSIS — Z79899 Other long term (current) drug therapy: Secondary | ICD-10-CM | POA: Insufficient documentation

## 2021-04-25 DIAGNOSIS — R040 Epistaxis: Secondary | ICD-10-CM | POA: Insufficient documentation

## 2021-04-25 DIAGNOSIS — E119 Type 2 diabetes mellitus without complications: Secondary | ICD-10-CM | POA: Insufficient documentation

## 2021-04-25 DIAGNOSIS — F17200 Nicotine dependence, unspecified, uncomplicated: Secondary | ICD-10-CM | POA: Insufficient documentation

## 2021-04-25 DIAGNOSIS — R0682 Tachypnea, not elsewhere classified: Secondary | ICD-10-CM | POA: Insufficient documentation

## 2021-04-25 DIAGNOSIS — E876 Hypokalemia: Secondary | ICD-10-CM | POA: Insufficient documentation

## 2021-04-25 DIAGNOSIS — Z794 Long term (current) use of insulin: Secondary | ICD-10-CM | POA: Insufficient documentation

## 2021-04-25 DIAGNOSIS — I10 Essential (primary) hypertension: Secondary | ICD-10-CM | POA: Insufficient documentation

## 2021-04-25 DIAGNOSIS — D649 Anemia, unspecified: Secondary | ICD-10-CM | POA: Insufficient documentation

## 2021-04-25 DIAGNOSIS — E871 Hypo-osmolality and hyponatremia: Secondary | ICD-10-CM | POA: Insufficient documentation

## 2021-04-25 DIAGNOSIS — Z7982 Long term (current) use of aspirin: Secondary | ICD-10-CM | POA: Insufficient documentation

## 2021-04-25 LAB — CULTURE, BLOOD (ROUTINE X 2)
Culture: NO GROWTH
Culture: NO GROWTH
Special Requests: ADEQUATE
Special Requests: ADEQUATE

## 2021-04-25 LAB — BASIC METABOLIC PANEL
Anion gap: 10 (ref 5–15)
BUN: 19 mg/dL (ref 8–23)
CO2: 18 mmol/L — ABNORMAL LOW (ref 22–32)
Calcium: 8.1 mg/dL — ABNORMAL LOW (ref 8.9–10.3)
Chloride: 104 mmol/L (ref 98–111)
Creatinine, Ser: 1.05 mg/dL — ABNORMAL HIGH (ref 0.44–1.00)
Glucose, Bld: 291 mg/dL — ABNORMAL HIGH (ref 70–99)
Potassium: 3.5 mmol/L (ref 3.5–5.1)
Sodium: 132 mmol/L — ABNORMAL LOW (ref 135–145)

## 2021-04-25 LAB — GLUCOSE, CAPILLARY
Glucose-Capillary: 189 mg/dL — ABNORMAL HIGH (ref 70–99)
Glucose-Capillary: 230 mg/dL — ABNORMAL HIGH (ref 70–99)

## 2021-04-25 LAB — LEGIONELLA PNEUMOPHILA SEROGP 1 UR AG: L. pneumophila Serogp 1 Ur Ag: NEGATIVE

## 2021-04-25 MED ORDER — AMOXICILLIN-POT CLAVULANATE 875-125 MG PO TABS
1.0000 | ORAL_TABLET | Freq: Two times a day (BID) | ORAL | 0 refills | Status: AC
  Filled 2021-04-25: qty 6, 3d supply, fill #0

## 2021-04-25 MED ORDER — FERROUS SULFATE 325 (65 FE) MG PO TABS
325.0000 mg | ORAL_TABLET | Freq: Two times a day (BID) | ORAL | 0 refills | Status: DC
  Filled 2021-04-25: qty 60, 30d supply, fill #0

## 2021-04-25 MED ORDER — IPRATROPIUM-ALBUTEROL 0.5-2.5 (3) MG/3ML IN SOLN
3.0000 mL | Freq: Four times a day (QID) | RESPIRATORY_TRACT | 0 refills | Status: DC | PRN
  Filled 2021-04-25: qty 180, 15d supply, fill #0

## 2021-04-25 MED ORDER — SODIUM BICARBONATE 650 MG PO TABS
650.0000 mg | ORAL_TABLET | Freq: Two times a day (BID) | ORAL | 0 refills | Status: AC
  Filled 2021-04-25: qty 14, 7d supply, fill #0

## 2021-04-25 MED ORDER — FOLIC ACID 1 MG PO TABS
1.0000 mg | ORAL_TABLET | Freq: Every day | ORAL | 0 refills | Status: AC
  Filled 2021-04-25: qty 30, 30d supply, fill #0

## 2021-04-25 MED ORDER — CYANOCOBALAMIN 250 MCG PO TABS
250.0000 ug | ORAL_TABLET | Freq: Every day | ORAL | 0 refills | Status: AC
  Filled 2021-04-25: qty 30, 30d supply, fill #0

## 2021-04-25 NOTE — ED Triage Notes (Signed)
Discharged from hospital today. Arrives c/o nosebleed and SOB. No epistaxis at this time.

## 2021-04-25 NOTE — TOC Initial Note (Addendum)
Transition of Care [facility]) - Initial/Assessment Note    Patient Details  Name: Michelle Nicholson MRN: [id number] Date of Birth: September 29, 1960  Transition of Care [facility] CM/SW Contact:    Marilu Favre, RN Phone Number: 04/25/2021, 1:01 PM  Clinical Narrative:                 Spoke to patient at bedside regarding home oxygen. [facility] will bring oxygen to hospital room prior to discharge today and explain oxygen equipment .   NCM answered questions.   Oxygen ordered with Freda Munro with [facility] and NEB machine.   Oxygen order is for -1 l . Secure chatted MD   Expected Discharge Plan: Home/Self Care Barriers to Discharge: No Barriers Identified   Patient Goals and CMS Choice Patient states their goals for this hospitalization and ongoing recovery are:: to return to home CMS Medicare.gov Compare Post Acute Care list provided to:: Patient Choice offered to / list presented to : Patient  Expected Discharge Plan and Services Expected Discharge Plan: Home/Self Care   Discharge Planning Services: CM Consult Post Acute Care Choice: Durable Medical Equipment Living arrangements for the past 2 months: Single Family Home Expected Discharge Date: 04/25/21               DME Arranged: Oxygen DME Agency: [employer] Date DME Agency Contacted: 04/25/21 Time DME Agency Contacted: 1300 Representative spoke with at DME Agency: Freda Munro Freda Munro) [facility] Arranged: NA          Prior Living Arrangements/Services Living arrangements for the past 2 months: Single Family Home Lives with:: Significant Other Patient language and need for interpreter reviewed:: Yes              Criminal Activity/Legal Involvement Pertinent to Current Situation/Hospitalization: No - Comment as needed  Activities of Daily Living Home Assistive Devices/Equipment: None ADL Screening (condition at time of admission) Patient's cognitive ability adequate to safely complete daily activities?: Yes Is the patient deaf or  have difficulty hearing?: No Does the patient have difficulty seeing, even when wearing glasses/contacts?: No Does the patient have difficulty concentrating, remembering, or making decisions?: No Patient able to express need for assistance with ADLs?: No Does the patient have difficulty dressing or bathing?: No Independently performs ADLs?: Yes (appropriate for developmental age) Does the patient have difficulty walking or climbing stairs?: No Weakness of Legs: None Weakness of Arms/Hands: None  Permission Sought/Granted   Permission granted to share information with : Yes, Verbal Permission Granted  Share Information with NAME: [name] other at bedside           Emotional Assessment Appearance:: Appears stated age Attitude/Demeanor/Rapport: Engaged Affect (typically observed): Accepting Orientation: : Oriented to Self, Oriented to Place, Oriented to  Time, Oriented to Situation Alcohol / Substance Use: Not Applicable Psych Involvement: No (comment)  Admission diagnosis:  Hyponatremia [E87.1] Pneumonia [J18.9] AKI (acute kidney injury) ([place]) [N17.9] Community acquired pneumonia, unspecified laterality [J18.9] Patient Active Problem List   Diagnosis Date Noted   Severe sepsis ([place]) 04/21/2021   Shortness of breath 04/21/2021   Acute respiratory failure with hypoxia ([place]) 04/21/2021   Chronic hyponatremia 04/21/2021   AKI (acute kidney injury) ([place]) 04/21/2021   Pneumonia 04/21/2021   Bilateral pneumonia 04/20/2021   Controlled type 2 diabetes mellitus with diabetic neuropathy, with long-term current use of insulin ([place]) 12/09/2015   Essential hypertension 12/09/2015   Meniere syndrome 12/09/2015   PCP:  [facility]:   [place],  [place] - [id number] [place] [address] Phone: [phone] Fax: [place] [address] [place]  [address] Phone: [phone] Fax: [phone]     Social Determinants of Health (SDOH) Interventions    Readmission Risk Interventions No flowsheet data found.

## 2021-04-25 NOTE — Progress Notes (Signed)
SATURATION QUALIFICATIONS: (This note is used to comply with regulatory documentation for home oxygen)  Patient Saturations on Room Air at Rest = 90%  Patient Saturations on Room Air while Ambulating = 84%  Patient Saturations on 2 Liters of oxygen while Ambulating = 90%  Please briefly explain why patient needs home oxygen: Pt is dyspnea on exertion and while ambulating.

## 2021-04-25 NOTE — Discharge Summary (Signed)
Physician Discharge Summary  Michelle Nicholson IPJ:[id number] DOB: 16-May-1961 DOA: 04/20/2021  PCP: [facility] [place] date: 04/20/2021 Discharge date: 04/25/2021  Admitted From: home Disposition:  home  Recommendations for Outpatient Follow-up:  Follow up with PCP in 1-2 weeks Please obtain BMP/CBC in one week  Home Health:no  Equipment/Devices: none  Discharge Condition: Stable Code Status:   Code Status: Full Code Diet recommendation:  Diet Order             Diet - low sodium heart healthy           Diet Carb Modified Fluid consistency: Thin; Room service appropriate? Yes  Diet effective now                    Brief/Interim Summary: 60 year old female with T2DM, HTN, hypothyroidism chronic hyponatremia 127-131, chronic anemia 10-11 0.5 presented to the [facility] with 3 to 4 days of progressive shortness of breath, new onset of productive cough, subjective fever and chills. In the ED fever 100.2, rest of the vitals fairly stable hypoxic at 89% room air needing 5 L nasal cannula saturating 94 to 92%. VBG notable for 7.249/32.  CMP Sodium 123, which is corrected to approximately 125 taking into account concomitant hyperglycemia, potassium 3.9, bicarbonate 14, anion gap 11, BUN 21, creatinine 1.50 relative to most recent prior value 0.95 in July 2022, glucose 228, and liver enzymes were found to be within normal limits.  BNP 165, leukocytosis 15.4, hemoglobin 9.4 lactic acid 1.5 COVID-19 and influenza PCR negative blood cultures x2 were sent placed on ceftriaxone azithromycin EKG no overt T wave or ST changes chest x-ray HCA airspace opacities in the bilateral lower lobe.  Patient was found to have pneumonia severe sepsis and acute hypoxic respiratory failure admitted.  Patient was treated with IV antibiotics severe sepsis parameters improved leukocytosis improved at this time able to cough oxygen may need oxygen with ambulation.  May have COPD underlying [place]  pulmonary follow-up with outpatient.   AKI hypernatremia and has stabilized. Is able to ambulate with PT OT at this time stable for discharge home. She walked in hallway needing 2l [place] on ambulation. She feels improved and stable for discharge home. Advised pulmo follow up as OP.  Discharge Diagnoses:  Severe sepsis POA due to pneumonia Bilateral pneumonia: Met severe sepsis criteria with pneumonia, leukocytosis, tachypnea in 36 on admission.BNP normal troponin negative.blood culture no growth so far.  At this time patient is clinically improved.  Leukocytosis almost resolved.  Treated with ceftriaxone/azithromycin, incentive spirometry , bronchodilators.  Continue oral antibiotics on discharge and follow-up with PCP. Recent Labs  Lab 04/20/21 1635 04/20/21 1721 04/20/21 1858 04/21/21 0123 04/21/21 1014 04/22/21 0111 04/23/21 0109 04/24/21 0135  WBC 15.4*  --   --  13.2*  --  15.5* 12.7* 12.4*  LATICACIDVEN  --  1.5 1.2  --  1.2  --   --   --   PROCALCITON  --   --   --  0.27  --   --   --   --     Acute hypoxic respiratory failure in the setting of pneumonia.D Dimer  mildly up at 1.1- VQ scan was normal, duplex negative and echo with no right heart dysfunction, normal EF.  Able to come off oxygen.  Ambulated with PT briefly desaturated in 87%  but rebounded quickly after a short standing break.  Eval for oxygen on ambulation prior to discharge- dropped to 84% on  RA and and did well on 2l [place]  Longstanding smoking history ?suspect underlying COPD:Continue bronchodilators, continue Pulmicort nebulizer, and steroid . Will need PFT outpatient and pulmonary follow-up. She says her PCP had given disk inhaler but could not take powder- sounds like advair.  AKI: Due to sepsis.  Resolved.  Off IV fluids, encourage p.o.   Recent Labs    05/07/20 1946 04/20/21 1635 04/20/21 1948 04/21/21 0123 04/21/21 1105 04/22/21 0111 04/23/21 0109 04/24/21 0135 04/25/21 0234  BUN _0 27*  22 19  CREATININE 0.92 1.50* 1.43* 1.33* 1.32* 1.21* 1.17* 1.05* 1.05*    Acute on chronic metabolic acidosis bicarb on admission 14, as low as 12-level improving on oral bicarb.  Had HD today.  Continue oral bicarb for few days follow-up with PCP.  She is on long-term methazolamide. Previous bicarb was in 76, 10/252021.  Here has normal lactate level.  Chronic hyponatremia: stable.She takes 1 g salt tablets 3 times daily as needed for "brain fog".  This could be in the setting of her  methalazomide use (but she reports she had hyponatremia even before this med). Recent Labs  Lab 04/21/21 1105 04/22/21 0111 04/23/21 0109 04/24/21 0135 04/25/21 0234  NA 132* 132* 132* 133* 132*    T2dm with neuropathy with long-term Tresiba: Uncontrolled HbA1c 9.0.  Blood sugar fluctuating/brittle-blood sugar dropped likely after backing off on steroid.  At this time no more hypoglycemia blood sugar fairly controlled patient may resume home insulin with close monitoring of blood sugar at home ( on 16 u tresiba at home).  Recent Labs  Lab 04/24/21 0858 04/24/21 1121 04/24/21 1655 04/24/21 2016 04/25/21 0825  GLUCAP 92 260* 267* 163* 189*    Essential hypertension: Well-controlled.  Resume home losartan on d.c  PVD with angioplasty of her legs last year- cont her asa 67, plavix and statins  Hypothyroidism: Continue her thyroid supplement  Chronic tobaccos use: Cessation advised  Glaucoma  had eye surgery 2 wks ago- cont prednisone and latanaprost drops her  methalazomide po.  Anxiety/depression:cont her Wellbutrin/Zoloft.  Chronic anemia Folate deficiency B12 insufficiency iron deficiency: Previous baseline hemoglobin 9-10.  Hb downtrended to 7.3- anemia panel showed B12 is borderline, folic acid is < 5.2, iron low-continue b12, folate and iron supplement.  She has had previous scope in the past, advised outpatient follow-up PCP/GI and CBC check in 1 .  Continue on supplement Recent Labs  Lab  04/20/21 1648 04/21/21 0123 04/22/21 0111 04/23/21 0109 04/24/21 0135  HGB 9.5* 8.3* 7.6* 7.3* 8.0*  HCT 28.0* 24.7* 22.5* 22.8* 24.2*      Consults: none  Subjective: AAOX3, ABLE TO WALK IN HALLWAY AND DID WELL, NO FEVER NO COUGH NO RESP DISTRESS, BREATHING OVERALL BETTER.  Discharge Exam: Vitals:   04/25/21 0421 04/25/21 1126  BP: (!) 159/64 (!) 146/55  Pulse: 77 69  Resp: 17 20  Temp: 98.8 F (37.1 C) 98.4 F (36.9 C)  SpO2: 90% 94%   General: Pt is alert, awake, not in acute distress Cardiovascular: RRR, S1/S2 +, no rubs, no gallops Respiratory: CTA bilaterally, no wheezing, no rhonchi Abdominal: Soft, NT, ND, bowel sounds + Extremities: no edema, no cyanosis  Discharge Instructions  Discharge Instructions     Diet - low sodium heart healthy   Complete by: As directed    Discharge instructions   Complete by: As directed    Please call call MD or return to ER for similar or worsening recurring problem that  brought you to hospital or if any fever,nausea/vomiting,abdominal pain, uncontrolled pain, chest pain,  shortness of breath or any other alarming symptoms.  Please follow-up your doctor as instructed in a week time and call the office for appointment. Check cbc in 5-7 days and Chest Xray in 4 wks from PCP   Please avoid alcohol, smoking, or any other illicit substance and maintain healthy habits including taking your regular medications as prescribed.  You were cared for by a hospitalist during your hospital stay. If you have any questions about your discharge medications or the care you received while you were in the hospital after you are discharged, you can call the unit and ask to speak with the hospitalist on call if the hospitalist that took care of you is not available.  Once you are discharged, your primary care physician will handle any further medical issues. Please note that NO REFILLS for any discharge medications will be authorized once you are  discharged, as it is imperative that you return to your primary care physician (or establish a relationship with a primary care physician if you do not have one) for your aftercare needs so that they can reassess your need for medications and monitor your lab values   Discharge wound care:   Complete by: As directed    Clean LE wound with saline, pat dry. Cut to fit silver hydrofiber (Aquacel Ag+) and place small piece in wound bed, top with silicone foam   For home use only DME Nebulizer machine   Complete by: As directed    Suspected COPD   Patient needs a nebulizer to treat with the following condition: Shortness of breath   Length of Need: Lifetime   For home use only DME oxygen   Complete by: As directed    2l on ambulation   Length of Need: Lifetime   Mode or (Route): Nasal cannula   Liters per Minute: 2   Oxygen delivery system: Gas   Increase activity slowly   Complete by: As directed       Allergies as of 04/25/2021       Reactions   Dorzolamide Hcl-timolol Mal Itching, Other (See Comments)   Makes her eyes burn Conjunctival Injection with Burning Visual Acuity Blurry   Brimonidine Rash, Other (See Comments)   Follicular conjunctivitis   Netarsudil-latanoprost Other (See Comments)   Redness, tearing, burning        Medication List     STOP taking these medications    sulfamethoxazole-trimethoprim 800-160 MG tablet Commonly known as: BACTRIM DS       TAKE these medications    acetaminophen 500 MG tablet Commonly known as: TYLENOL Take 1,000 mg by mouth every 6 (six) hours as needed for headache (pain).   albuterol 108 (90 Base) MCG/ACT inhaler Commonly known as: VENTOLIN HFA Inhale 1 puff into the lungs every 6 (six) hours as needed for wheezing or shortness of breath.   amLODipine 5 MG tablet Commonly known as: NORVASC Take 5 mg by mouth every morning.   amLODipine 2.5 MG tablet Commonly known as: NORVASC Take 2.5 mg by mouth at bedtime.    amoxicillin-clavulanate 875-125 MG tablet Commonly known as: Augmentin Take 1 tablet by mouth 2 (two) times daily for 3 days.   aspirin 81 MG EC tablet Take 81 mg by mouth every morning.   atorvastatin 40 MG tablet Commonly known as: LIPITOR Take 40 mg by mouth every morning.   buPROPion 150 MG 24 hr tablet Commonly  known as: WELLBUTRIN XL Take 150 mg by mouth every morning.   CAL-MAG-ZINC PO Take 1 tablet by mouth every morning.   clopidogrel 75 MG tablet Commonly known as: PLAVIX Take 75 mg by mouth every morning.   ferrous sulfate 325 (65 FE) MG tablet Take 1 tablet (325 mg total) by mouth 2 (two) times daily with a meal.   Fish Oil 1000 MG Caps Take 2,000 mg by mouth every morning.   fluconazole 150 MG tablet Commonly known as: DIFLUCAN Take 150 mg by mouth See admin instructions. Take one tablet (150 mg) once as needed for yeast infection from antibiotic, may repeat in 3 days if still needed.   folic acid 1 MG tablet Commonly known as: FOLVITE Take 1 tablet (1 mg total) by mouth daily.   insulin aspart 100 UNIT/ML FlexPen Commonly known as: NOVOLOG Inject 4 Units into the skin See admin instructions. Inject 4 units subcutaneously three times daily after meals - plus sliding scale adjustment as directed up to 30 units daily   insulin degludec 100 UNIT/ML FlexTouch Pen Commonly known as: Tyler Aas FlexTouch Inject 7 Units into the skin daily. What changed:  how much to take when to take this   ipratropium-albuterol 0.5-2.5 (3) MG/3ML Soln Commonly known as: DUONEB Take 3 mLs by nebulization every 6 (six) hours as needed for up to 15 days.   latanoprost 0.005 % ophthalmic solution Commonly known as: XALATAN Place 1 drop into both eyes at bedtime.   levothyroxine 50 MCG tablet Commonly known as: SYNTHROID Take 50 mcg by mouth daily before breakfast.   losartan 50 MG tablet Commonly known as: COZAAR Take 50 mg by mouth every morning.   methazolamide 50  MG tablet Commonly known as: NEPTAZANE Take 50 mg by mouth 2 (two) times daily.   multivitamin with minerals Tabs tablet Take 1 tablet by mouth every morning.   Pancrelipase (Lip-Prot-Amyl) 24000-76000 units Cpep Take 2 capsules by mouth daily after breakfast.   prednisoLONE acetate 1 % ophthalmic suspension Commonly known as: PRED FORTE Place 1 drop into the right eye 4 (four) times daily.   pregabalin 300 MG capsule Commonly known as: LYRICA Take 300 mg by mouth at bedtime.   sertraline 100 MG tablet Commonly known as: ZOLOFT Take 100 mg by mouth every morning.   sodium bicarbonate 650 MG tablet Take 1 tablet (650 mg total) by mouth 2 (two) times daily for 7 days.   sodium chloride 1 g tablet Take 1 g by mouth 3 (three) times daily as needed (brain fog from sodium insufficiency).   vitamin B-12 250 MCG tablet Commonly known as: CYANOCOBALAMIN Take 1 tablet (250 mcg total) by mouth daily.   Vitamin D (Ergocalciferol) 1.25 MG (50000 UNIT) Caps capsule Commonly known as: DRISDOL Take 50,000 Units by mouth every _0 /13/22 1148   04/25/21 0000  For home use only DME Nebulizer machine       Comments: Suspected COPD  Question Answer  Comment  Patient needs a nebulizer to treat with the following condition Shortness of breath   Length of Need Lifetime      04/25/21 1148              Discharge Care Instructions  (From admission, onward)           Start     Ordered   04/25/21 0000  Discharge wound care:       Comments: Clean LE wound with saline, pat dry. Cut to fit silver hydrofiber (Aquacel Ag+) and place small piece in wound  bed, top with silicone foam   33/54/56 1148            Follow-up Information     [facility] Follow up in 1 week(s).   Why: CBC in 1 week, chest x-ray in 4 weeks Contact information: [place] [address] [phone]                Allergies  Allergen Reactions   Dorzolamide Hcl-Timolol Mal Itching and Other (See Comments)    Makes her eyes burn Conjunctival Injection with Burning Visual Acuity Blurry    Brimonidine Rash and Other (See Comments)    Follicular conjunctivitis   Netarsudil-Latanoprost Other (See Comments)    Redness, tearing, burning    The results of significant diagnostics from this hospitalization (including imaging, microbiology, ancillary and laboratory) are listed below for reference.    Microbiology: Recent Results (from the past 240 hour(s))  Resp Panel by RT-PCR (Flu A&B, Covid) Nasopharyngeal Swab     Status: None   Collection Time: 04/20/21  4:38 PM   Specimen: Nasopharyngeal Swab; Nasopharyngeal(NP) swabs in vial transport medium  Result Value Ref Range Status   SARS Coronavirus 2 by RT PCR NEGATIVE NEGATIVE Final    Comment: (NOTE) SARS-CoV-2 target nucleic acids are NOT DETECTED.  The SARS-CoV-2 RNA is generally detectable in upper respiratory specimens during the acute phase of infection. The lowest concentration of SARS-CoV-2 viral copies this assay can detect is 138 copies/mL. A negative result does not preclude SARS-Cov-2 infection and should not be used as the sole basis for treatment or other patient management decisions. A negative result may occur with  improper specimen collection/handling, submission of specimen other than nasopharyngeal swab, presence of viral mutation(s) within the areas targeted by this assay, and inadequate number of viral copies(<138 copies/mL). A negative result must be combined with clinical observations, patient history, and  epidemiological information. The expected result is Negative.  Fact Sheet for Patients:  EntrepreneurPulse.com.au  Fact Sheet for Healthcare Providers:  IncredibleEmployment.be  This test is no t yet approved or cleared by the [place] FDA and  has been authorized for detection and/or diagnosis of SARS-CoV-2 by FDA under an Emergency Use Authorization (EUA). This EUA will remain  in effect (meaning this test can be used) for the duration of the COVID-19 declaration under Section 564(b)(1) of the Act, 21 U.S.C.section 360bbb-3(b)(1), unless the authorization is terminated  or revoked sooner.       Influenza A by PCR NEGATIVE NEGATIVE Final   Influenza B by PCR NEGATIVE NEGATIVE Final    Comment: (NOTE) The Xpert Xpress SARS-CoV-2/FLU/RSV plus assay is intended as an aid in the diagnosis of influenza from Nasopharyngeal swab specimens and should not be used as a sole basis for treatment. Nasal washings and aspirates are unacceptable for Xpert Xpress SARS-CoV-2/FLU/RSV testing.  Fact Sheet for Patients: EntrepreneurPulse.com.au  Fact Sheet  for Healthcare Providers: IncredibleEmployment.be  This test is not yet approved or cleared by the [place] and has been authorized for detection and/or diagnosis of SARS-CoV-2 by FDA under an Emergency Use Authorization (EUA). This EUA will remain in effect (meaning this test can be used) for the duration of the COVID-19 declaration under Section 564(b)(1) of the Act, 21 U.S.C. section 360bbb-3(b)(1), unless the authorization is terminated or revoked.  Performed at [facility], [address] [place], [place] [address]   Blood culture (routine x 2)     Status: None   Collection Time: 04/20/21  5:22 PM   Specimen: Right Antecubital; Blood  Result Value Ref Range Status   Specimen Description   Final    RIGHT ANTECUBITAL BLOOD Performed at  [facility], [address] [place], [place] [address]    Special Requests   Final    Blood Culture adequate volume BOTTLES DRAWN AEROBIC AND ANAEROBIC Performed at [facility], [address] [place], [place] [address]    Culture   Final    NO GROWTH 5 DAYS Performed at [facility], [place] [address], [place], [place] [address]    Report Status 04/25/2021 FINAL  Final  Blood culture (routine x 2)     Status: None   Collection Time: 04/20/21  5:28 PM   Specimen: BLOOD LEFT WRIST  Result Value Ref Range Status   Specimen Description   Final    BLOOD LEFT WRIST BLOOD Performed at [facility], [address] [place], [place] [address]    Special Requests   Final    Blood Culture adequate volume BOTTLES DRAWN AEROBIC AND ANAEROBIC Performed at [facility], [address], [place], [place] [address]    Culture   Final    NO GROWTH 5 DAYS Performed at [facility], [place] [address] [place], [place] [address]    Report Status 04/25/2021 FINAL  Final    Procedures/Studies: NM Pulmonary Perfusion  Result Date: 04/22/2021 CLINICAL DATA:  Respiratory failure.  Shortness of breath.  COPD. EXAM: NUCLEAR MEDICINE PERFUSION LUNG SCAN TECHNIQUE: Perfusion images were obtained in multiple projections after intravenous injection of radiopharmaceutical. Ventilation scans intentionally deferred if perfusion scan and chest x-ray adequate for interpretation during COVID 19 epidemic. RADIOPHARMACEUTICALS:  4.1 mCi Tc-2m MAA IV COMPARISON:  Chest x-ray today. FINDINGS: Vague patchy nonsegmental perfusion defects noted bilaterally, likely related to obstructive lung disease. No segmental or subsegmental wedge-shaped perfusion defects to suggest pulmonary embolus. IMPRESSION: No evidence of pulmonary embolus. Electronically Signed   By: Rolm Baptise M.D.   On: 04/22/2021 12:25   DG CHEST PORT 1 VIEW  Result Date: 04/22/2021 CLINICAL DATA:   Shortness of breath EXAM: PORTABLE CHEST 1 VIEW COMPARISON:  04/20/2021 FINDINGS: Heart is normal size. Diffuse interstitial prominence with hazy patchy opacities throughout the lungs. Findings could reflect edema or infection. Findings similar to prior study. No effusions. No acute bony abnormality. IMPRESSION: Interstitial prominence and patchy bilateral airspace disease, edema versus infection. Electronically Signed   By: Rolm Baptise M.D.   On: 04/22/2021 11:21   DG Chest Port 1 View  Result Date: 04/20/2021 CLINICAL DATA:  SOB x 4 days, sore throat, cough, bilat coarse crackles. Hx stroke. Hx pna. EXAM: PORTABLE CHEST 1 VIEW COMPARISON:  Chest x-ray 05/07/2020, CT chest 05/07/2020, chest x-ray 08/12/2019 FINDINGS: The heart and mediastinal contours are unchanged. Aortic calcification.  Hazy patchy airspace opacities of bilateral lower mid lung zones. Slightly increased interstitial markings. No pleural effusion. No pneumothorax. No acute osseous abnormality. IMPRESSION: Hazy patchy airspace opacities of bilateral lower mid lung zones. Findings likely represent infection/inflammation. Followup PA and lateral chest X-ray is recommended in 3-4 weeks following therapy to ensure resolution. Electronically Signed   By: Iven Finn M.D.   On: 04/20/2021 17:30   ECHOCARDIOGRAM COMPLETE  Result Date: 04/21/2021    ECHOCARDIOGRAM REPORT   Patient Name:   Michelle Nicholson Date of Exam: 04/21/2021 Medical Rec #:  [id number]  Height:       69.0 in Accession #:    [id number] Weight:       151.0 lb Date of Birth:  09-10-1960  BSA:          1.833 m Patient Age:    59 years   BP:           134/46 mmHg Patient Gender: F          HR:           62 bpm. Exam Location:  Inpatient Procedure: 2D Echo, Cardiac Doppler and Color Doppler Indications:    [place] respiratory distress R06.03  History:        Patient has no prior history of Echocardiogram examinations.                 Risk Factors:Diabetes and Hypertension.  Sonographer:     Bernadene Person RDCS Referring Phys: [id number] [place] IMPRESSIONS  1. Left ventricular ejection fraction, by estimation, is 60 to 65%. The left ventricle has normal function. The left ventricle has no regional wall motion abnormalities. Left ventricular diastolic parameters are consistent with Grade II diastolic dysfunction (pseudonormalization).  2. Right ventricular systolic function is normal. The right ventricular size is normal. There is normal pulmonary artery systolic pressure.  3. Left atrial size was mildly dilated.  4. The mitral valve is normal in structure. Mild to moderate mitral valve regurgitation. No evidence of mitral stenosis.  5. The aortic valve is normal in structure. Aortic valve regurgitation is not visualized. Mild aortic valve sclerosis is present, with no evidence of aortic valve stenosis.  6. The inferior vena cava is normal in size with greater than 50% respiratory variability, suggesting right atrial pressure of 3 mmHg. FINDINGS  Left Ventricle: Left ventricular ejection fraction, by estimation, is 60 to 65%. The left ventricle has normal function. The left ventricle has no regional wall motion abnormalities. The left ventricular internal cavity size was normal in size. There is  no left ventricular hypertrophy. Left ventricular diastolic parameters are consistent with Grade II diastolic dysfunction (pseudonormalization). Right Ventricle: The right ventricular size is normal. No increase in right ventricular wall thickness. Right ventricular systolic function is normal. There is normal pulmonary artery systolic pressure. The tricuspid regurgitant velocity is 1.87 m/s, and  with an assumed right atrial pressure of 8 mmHg, the estimated right ventricular systolic pressure is 20.1 mmHg. Left Atrium: Left atrial size was mildly dilated. Right Atrium: Right atrial size was normal in size. Pericardium: There is no evidence of pericardial effusion. Mitral Valve: The mitral valve is normal in  structure. Mild mitral annular calcification. Mild to moderate mitral valve regurgitation. No evidence of mitral valve stenosis. Tricuspid Valve: The tricuspid valve is normal in structure. Tricuspid valve regurgitation is not demonstrated. No evidence of tricuspid stenosis. Aortic Valve: The aortic valve is normal in structure. Aortic valve regurgitation is not visualized. Mild aortic  valve sclerosis is present, with no evidence of aortic valve stenosis. Pulmonic Valve: The pulmonic valve was normal in structure. Pulmonic valve regurgitation is not visualized. No evidence of pulmonic stenosis. Aorta: The aortic root is normal in size and structure. Venous: The inferior vena cava is normal in size with greater than 50% respiratory variability, suggesting right atrial pressure of 3 mmHg. IAS/Shunts: No atrial level shunt detected by color flow Doppler.  LEFT VENTRICLE PLAX 2D LVIDd:         4.20 cm   Diastology LVIDs:         2.20 cm   LV e' medial:    6.41 cm/s LV PW:         1.10 cm   LV E/e' medial:  18.3 LV IVS:        1.10 cm   LV e' lateral:   8.03 cm/s LVOT diam:     2.10 cm   LV E/e' lateral: 14.6 LV SV:         94 LV SV Index:   51 LVOT Area:     3.46 cm  RIGHT VENTRICLE RV S prime:     14.80 cm/s TAPSE (M-mode): 2.6 cm LEFT ATRIUM             Index        RIGHT ATRIUM           Index LA diam:        3.10 cm 1.69 cm/m   RA Area:     13.30 cm LA Vol (A2C):   61.1 ml 33.33 ml/m  RA Volume:   29.40 ml  16.04 ml/m LA Vol (A4C):   59.1 ml 32.24 ml/m LA Biplane Vol: 60.1 ml 32.78 ml/m  AORTIC VALVE LVOT Vmax:   112.00 cm/s LVOT Vmean:  73.200 cm/s LVOT VTI:    0.272 m  AORTA Ao Root diam: 3.50 cm Ao Asc diam:  3.60 cm MITRAL VALVE                  TRICUSPID VALVE MV Area (PHT): 3.12 cm       TR Peak grad:   14.0 mmHg MV Decel Time: 243 msec       TR Vmax:        187.00 cm/s MR Peak grad:    112.8 mmHg MR Mean grad:    84.0 mmHg    SHUNTS MR Vmax:         531.00 cm/s  Systemic VTI:  0.27 m MR Vmean:         440.0 cm/s   Systemic Diam: 2.10 cm MR PISA:         1.01 cm MR PISA Eff ROA: 7 mm MR PISA Radius:  0.40 cm MV E velocity: 117.00 cm/s MV A velocity: 100.00 cm/s MV E/A ratio:  1.17 Mihai Croitoru MD Electronically signed by Sanda Klein MD Signature Date/Time: 04/21/2021/3:22:03 PM    Final    VAS [place] LOWER EXTREMITY VENOUS (DVT)  Result Date: 04/22/2021  Lower Venous DVT Study Patient Name:  Michelle Nicholson  Date of Exam:   04/21/2021 Medical Rec #: [id number]   Accession #:    [id number] Date of Birth: October 01, 1960   Patient Gender: F Patient Age:   17 years Exam Location:  [facility] Procedure:      VAS [place] LOWER EXTREMITY VENOUS (DVT) Referring Phys: [place] --------------------------------------------------------------------------------  Indications: Elevated d-dimer.  Comparison Study: No prior study Performing Technologist: Sharyn Lull  Simonetti MHA, RDMS, RVT, RDCS  Examination Guidelines: A complete evaluation includes B-mode imaging, spectral Doppler, color Doppler, and power Doppler as needed of all accessible portions of each vessel. Bilateral testing is considered an integral part of a complete examination. Limited examinations for reoccurring indications may be performed as noted. The reflux portion of the exam is performed with the patient in reverse Trendelenburg.  +---------+---------------+---------+-----------+----------+--------------+ RIGHT    CompressibilityPhasicitySpontaneityPropertiesThrombus Aging +---------+---------------+---------+-----------+----------+--------------+ CFV      Full           Yes      Yes                                 +---------+---------------+---------+-----------+----------+--------------+ SFJ      Full                                                        +---------+---------------+---------+-----------+----------+--------------+ FV Prox  Full                                                         +---------+---------------+---------+-----------+----------+--------------+ FV Mid   Full                                                        +---------+---------------+---------+-----------+----------+--------------+ FV DistalFull                                                        +---------+---------------+---------+-----------+----------+--------------+ PFV      Full                                                        +---------+---------------+---------+-----------+----------+--------------+ POP      Full           Yes      Yes                                 +---------+---------------+---------+-----------+----------+--------------+ PTV      Full                                                        +---------+---------------+---------+-----------+----------+--------------+ PERO     Full                                                        +---------+---------------+---------+-----------+----------+--------------+   +---------+---------------+---------+-----------+----------+--------------+  LEFT     CompressibilityPhasicitySpontaneityPropertiesThrombus Aging +---------+---------------+---------+-----------+----------+--------------+ CFV      Full           Yes      Yes                                 +---------+---------------+---------+-----------+----------+--------------+ SFJ      Full                                                        +---------+---------------+---------+-----------+----------+--------------+ FV Prox  Full                                                        +---------+---------------+---------+-----------+----------+--------------+ FV Mid   Full                                                        +---------+---------------+---------+-----------+----------+--------------+ FV DistalFull                                                         +---------+---------------+---------+-----------+----------+--------------+ PFV      Full                                                        +---------+---------------+---------+-----------+----------+--------------+ POP      Full           Yes      Yes                                 +---------+---------------+---------+-----------+----------+--------------+ PTV      Full                                                        +---------+---------------+---------+-----------+----------+--------------+ PERO     Full                                                        +---------+---------------+---------+-----------+----------+--------------+     Summary: BILATERAL: - No evidence of deep vein thrombosis seen in the lower extremities, bilaterally. -No evidence of popliteal cyst, bilaterally.   *See table(s) above for measurements and observations. Electronically signed by Orlie Pollen on 04/22/2021 at 6:48:39 AM.  Final     Labs: BNP (last 3 results) Recent Labs    04/20/21 1635  BNP 102.1*   Basic Metabolic Panel: Recent Labs  Lab 04/21/21 0123 04/21/21 1105 04/22/21 0111 04/23/21 0109 04/24/21 0135 04/25/21 0234  NA 129* 132* 132* 132* 133* 132*  K 4.0 4.1 4.1 4.4 3.6 3.5  CL 107 108 108 109 107 104  CO2 12* 13* 16* 15* 19* 18*  GLUCOSE 176* 237* 217* 255* 94 291*  BUN _0 27* 22 19  CREATININE 1.33* 1.32* 1.21* 1.17* 1.05* 1.05*  CALCIUM 8.2* 8.4* 8.4* 8.3* 8.3* 8.1*  MG 2.1  2.2  --   --   --   --   --   PHOS 3.2  --   --   --   --   --    Liver Function Tests: Recent Labs  Lab 04/20/21 1635 04/21/21 0123 04/22/21 0111 04/23/21 0109  AST _1 45*  ALT _2 ALKPHOS 122 105 92 112  BILITOT 0.2* 0.6 0.1* 0.6  PROT 7.8 6.3* 6.0* 5.9*  ALBUMIN 3.4* 2.6* 2.3* 2.3*   No results for input(s): LIPASE, AMYLASE in the last 168 hours. No results for input(s): AMMONIA in the last 168 hours. CBC: Recent Labs  Lab 04/20/21 1635  04/20/21 1648 04/21/21 0123 04/22/21 0111 04/23/21 0109 04/24/21 0135  WBC 15.4*  --  13.2* 15.5* 12.7* 12.4*  NEUTROABS 13.1*  --  12.1*  --   --   --   HGB 9.4* 9.5* 8.3* 7.6* 7.3* 8.0*  HCT 28.1* 28.0* 24.7* 22.5* 22.8* 24.2*  MCV 89.5  --  87.6 87.9 89.8 88.3  PLT 431*  --  374 371 391 428*   Cardiac Enzymes: No results for input(s): CKTOTAL, CKMB, CKMBINDEX, TROPONINI in the last 168 hours. BNP: Invalid input(s): POCBNP CBG: Recent Labs  Lab 04/24/21 0858 04/24/21 1121 04/24/21 1655 04/24/21 2016 04/25/21 0825  GLUCAP 92 260* 267* 163* 189*   D-Dimer No results for input(s): DDIMER in the last 72 hours. Hgb A1c Recent Labs    04/24/21 1030  HGBA1C 9.0*   Lipid Profile No results for input(s): CHOL, HDL, LDLCALC, TRIG, CHOLHDL, LDLDIRECT in the last 72 hours. Thyroid function studies No results for input(s): TSH, T4TOTAL, T3FREE, THYROIDAB in the last 72 hours.  Invalid input(s): FREET3 Anemia work up No results for input(s): VITAMINB12, FOLATE, FERRITIN, TIBC, IRON, RETICCTPCT in the last 72 hours. Urinalysis    Component Value Date/Time   COLORURINE YELLOW 04/02/2019 2321   APPEARANCEUR HAZY (A) 04/02/2019 2321   LABSPEC 1.016 04/02/2019 2321   PHURINE 5.0 04/02/2019 2321   GLUCOSEU NEGATIVE 04/02/2019 2321   HGBUR NEGATIVE 04/02/2019 2321   BILIRUBINUR NEGATIVE 04/02/2019 2321   KETONESUR 5 (A) 04/02/2019 2321   PROTEINUR 100 (A) 04/02/2019 2321   UROBILINOGEN 1.0 12/29/2012 1628   NITRITE NEGATIVE 04/02/2019 2321   LEUKOCYTESUR SMALL (A) 04/02/2019 2321   Sepsis Labs Invalid input(s): PROCALCITONIN,  WBC,  LACTICIDVEN Microbiology Recent Results (from the past 240 hour(s))  Resp Panel by RT-PCR (Flu A&B, Covid) Nasopharyngeal Swab     Status: None   Collection Time: 04/20/21  4:38 PM   Specimen: Nasopharyngeal Swab; Nasopharyngeal(NP) swabs in vial transport medium  Result Value Ref Range Status   SARS Coronavirus 2 by RT PCR NEGATIVE  NEGATIVE Final    Comment: (NOTE) SARS-CoV-2 target nucleic acids are NOT DETECTED.  The SARS-CoV-2 RNA is generally detectable in upper  respiratory specimens during the acute phase of infection. The lowest concentration of SARS-CoV-2 viral copies this assay can detect is 138 copies/mL. A negative result does not preclude SARS-Cov-2 infection and should not be used as the sole basis for treatment or other patient management decisions. A negative result may occur with  improper specimen collection/handling, submission of specimen other than nasopharyngeal swab, presence of viral mutation(s) within the areas targeted by this assay, and inadequate number of viral copies(<138 copies/mL). A negative result must be combined with clinical observations, patient history, and epidemiological information. The expected result is Negative.  Fact Sheet for Patients:  EntrepreneurPulse.com.au  Fact Sheet for Healthcare Providers:  IncredibleEmployment.be  This test is no t yet approved or cleared by the [place] FDA and  has been authorized for detection and/or diagnosis of SARS-CoV-2 by FDA under an Emergency Use Authorization (EUA). This EUA will remain  in effect (meaning this test can be used) for the duration of the COVID-19 declaration under Section 564(b)(1) of the Act, 21 U.S.C.section 360bbb-3(b)(1), unless the authorization is terminated  or revoked sooner.       Influenza A by PCR NEGATIVE NEGATIVE Final   Influenza B by PCR NEGATIVE NEGATIVE Final    Comment: (NOTE) The Xpert Xpress SARS-CoV-2/FLU/RSV plus assay is intended as an aid in the diagnosis of influenza from Nasopharyngeal swab specimens and should not be used as a sole basis for treatment. Nasal washings and aspirates are unacceptable for Xpert Xpress SARS-CoV-2/FLU/RSV testing.  Fact Sheet for Patients: EntrepreneurPulse.com.au  Fact Sheet for Healthcare  Providers: IncredibleEmployment.be  This test is not yet approved or cleared by the [place] FDA and has been authorized for detection and/or diagnosis of SARS-CoV-2 by FDA under an Emergency Use Authorization (EUA). This EUA will remain in effect (meaning this test can be used) for the duration of the COVID-19 declaration under Section 564(b)(1) of the Act, 21 U.S.C. section 360bbb-3(b)(1), unless the authorization is terminated or revoked.  Performed at [facility], [address] [place], [place] [address]   Blood culture (routine x 2)     Status: None   Collection Time: 04/20/21  5:22 PM   Specimen: Right Antecubital; Blood  Result Value Ref Range Status   Specimen Description   Final    RIGHT ANTECUBITAL BLOOD Performed at [facility], [address] [place], [place] [address]    Special Requests   Final    Blood Culture adequate volume BOTTLES DRAWN AEROBIC AND ANAEROBIC Performed at [facility], [address], [place], [place] [address]    Culture   Final    NO GROWTH 5 DAYS Performed at [facility], [place] [address], [place], [place] [address]    Report Status 04/25/2021 FINAL  Final  Blood culture (routine x 2)     Status: None   Collection Time: 04/20/21  5:28 PM   Specimen: BLOOD LEFT WRIST  Result Value Ref Range Status   Specimen Description   Final    BLOOD LEFT WRIST BLOOD Performed at [facility], [address] [place], [place] [address]    Special Requests   Final    Blood Culture adequate volume BOTTLES DRAWN AEROBIC AND ANAEROBIC Performed at [facility], [address], [place], [place] [address]    Culture   Final    NO GROWTH 5 DAYS Performed at [facility], [place]  [address] [place], [place] [address]    Report Status 04/25/2021 FINAL  Final     Time coordinating discharge: 35 minutes  SIGNED: Antonieta Pert, MD  [facility] 04/25/2021,  12:33 PM  If 7PM-7AM, please contact night-coverage [email or web address]

## 2021-04-25 NOTE — Progress Notes (Signed)
Michelle Nicholson to be D/C'd  per MD order.  Discussed with the patient and all questions fully answered.  VSS, Skin clean, dry and intact without evidence of skin break down, no evidence of skin tears noted.  IV catheter discontinued intact. Site without signs and symptoms of complications. Dressing and pressure applied.  An After Visit Summary was printed and given to the patient. Patient received prescription and oxygen has been delivered to the room.  D/c re-education completed with patient/family including follow up instructions, medication list, d/c activities limitations if indicated, with other d/c instructions as indicated by MD - patient able to verbalize understanding, all questions fully answered.   Patient instructed to return to ED, call 911, or call MD for any changes in condition.   Patient to be escorted via [place], and D/C home via private auto.

## 2021-04-26 ENCOUNTER — Emergency Department (HOSPITAL_BASED_OUTPATIENT_CLINIC_OR_DEPARTMENT_OTHER): Payer: BC Managed Care – PPO

## 2021-04-26 ENCOUNTER — Other Ambulatory Visit: Payer: Self-pay

## 2021-04-26 LAB — BASIC METABOLIC PANEL
Anion gap: 10 (ref 5–15)
BUN: 21 mg/dL (ref 8–23)
CO2: 19 mmol/L — ABNORMAL LOW (ref 22–32)
Calcium: 8 mg/dL — ABNORMAL LOW (ref 8.9–10.3)
Chloride: 102 mmol/L (ref 98–111)
Creatinine, Ser: 1.03 mg/dL — ABNORMAL HIGH (ref 0.44–1.00)
GFR, Estimated: >60 mL/min (ref >60)
Glucose, Bld: 219 mg/dL — ABNORMAL HIGH (ref 70–99)
Potassium: 3.1 mmol/L — ABNORMAL LOW (ref 3.5–5.1)
Sodium: 131 mmol/L — ABNORMAL LOW (ref 135–145)

## 2021-04-26 LAB — CBC WITH DIFFERENTIAL/PLATELET
Abs Immature Granulocytes: 0.18 10*3/uL — ABNORMAL HIGH (ref 0.00–0.07)
Basophils Absolute: 0 10*3/uL (ref 0.0–0.1)
Basophils Relative: 0 %
Eosinophils Absolute: 0.2 10*3/uL (ref 0.0–0.5)
Eosinophils Relative: 1 %
HCT: 24.5 % — ABNORMAL LOW (ref 36.0–46.0)
Hemoglobin: 8.2 g/dL — ABNORMAL LOW (ref 12.0–15.0)
Immature Granulocytes: 2 %
Lymphocytes Relative: 14 %
Lymphs Abs: 1.6 10*3/uL (ref 0.7–4.0)
MCH: 29.3 pg (ref 26.0–34.0)
MCHC: 33.5 g/dL (ref 30.0–36.0)
MCV: 87.5 fL (ref 80.0–100.0)
Monocytes Absolute: 0.5 10*3/uL (ref 0.1–1.0)
Monocytes Relative: 5 %
Neutro Abs: 9.1 10*3/uL — ABNORMAL HIGH (ref 1.7–7.7)
Neutrophils Relative %: 78 %
Platelets: 424 10*3/uL — ABNORMAL HIGH (ref 150–400)
RBC: 2.8 MIL/uL — ABNORMAL LOW (ref 3.87–5.11)
RDW: 14.1 % (ref 11.5–15.5)
WBC: 11.6 10*3/uL — ABNORMAL HIGH (ref 4.0–10.5)
nRBC: 0 % (ref 0.0–0.2)

## 2021-04-26 MED ORDER — POTASSIUM CHLORIDE CRYS ER 20 MEQ PO TBCR
40.0000 meq | EXTENDED_RELEASE_TABLET | Freq: Once | ORAL | Status: AC
  Administered 2021-04-26: 40 meq via ORAL
  Filled 2021-04-26: qty 2

## 2021-04-26 NOTE — ED Provider Notes (Signed)
Michelle Nicholson   CSN: [id number] Arrival date & time: 04/25/21  2349     History Chief Complaint  Patient presents with   Epistaxis    Michelle Nicholson is a 60 y.o. female.  HPI  60 year old female past medical history of HTN, DM, recent admission/discharge yesterday for bilateral pneumonia and high hypoxic respiratory failure now requiring supplemental oxygen presents emergency department with nosebleed.  Patient states she started having bleeding out of the left nostril.  She has been trying to use Vaseline at home for nasal irritation secondary to [place] oxygen.  She states the nose bleeding lasted about 45 minutes but then subsided.  She now feels weak, short of breath and nauseous.  Denies any active chest pain.  States has been taking her prescribed medications for the pneumonia.  Past Medical History:  Diagnosis Date   Diabetes mellitus    Hypertension    Neuropathy    Pancreas disorder    Peripheral vascular disease ([place])     Patient Active Problem List   Diagnosis Date Noted   Severe sepsis ([place]) 04/21/2021   Shortness of breath 04/21/2021   Acute respiratory failure with hypoxia ([place]) 04/21/2021   Chronic hyponatremia 04/21/2021   AKI (acute kidney injury) ([place]) 04/21/2021   Pneumonia 04/21/2021   Bilateral pneumonia 04/20/2021   Controlled type 2 diabetes mellitus with diabetic neuropathy, with long-term current use of insulin ([place]) 12/09/2015   Essential hypertension 12/09/2015   Meniere syndrome 12/09/2015    Past Surgical History:  Procedure Laterality Date   CARDIAC CATHETERIZATION       OB History   No obstetric history on file.     No family history on file.  Social History   Tobacco Use   Smoking status: Every Day   Smokeless tobacco: Never  Substance Use Topics   Alcohol use: No   Drug use: No    Home Medications Prior to Admission medications   Medication Sig Start Date End Date Taking? Authorizing  Provider  acetaminophen (TYLENOL) 500 MG tablet Take 1,000 mg by mouth every 6 (six) hours as needed for headache (pain).    [provider]  albuterol (VENTOLIN HFA) 108 (90 Base) MCG/ACT inhaler Inhale 1 puff into the lungs every 6 (six) hours as needed for wheezing or shortness of breath. 10/22/17   [provider]  amLODipine (NORVASC) 2.5 MG tablet Take 2.5 mg by mouth at bedtime. 04/18/21   [provider]  amLODipine (NORVASC) 5 MG tablet Take 5 mg by mouth every morning. 03/14/21   [provider]  amoxicillin-clavulanate (AUGMENTIN) 875-125 MG tablet Take 1 tablet by mouth 2 (two) times daily for 3 days. 04/25/21 04/28/21  Antonieta Pert, MD  aspirin 81 MG EC tablet Take 81 mg by mouth every morning.    [provider]  atorvastatin (LIPITOR) 40 MG tablet Take 40 mg by mouth every morning. 04/01/21   [provider]  buPROPion (WELLBUTRIN XL) 150 MG 24 hr tablet Take 150 mg by mouth every morning. 03/05/21   [provider]  Calcium-Magnesium-Zinc (CAL-MAG-ZINC PO) Take 1 tablet by mouth every morning.    [provider]  clopidogrel (PLAVIX) 75 MG tablet Take 75 mg by mouth every morning. 04/18/21   [provider]  ferrous sulfate 325 (65 FE) MG tablet Take 1 tablet (325 mg total) by mouth 2 (two) times daily with a meal. 04/25/21 05/25/21  Antonieta Pert, MD  fluconazole (DIFLUCAN) 150 MG tablet  Take 150 mg by mouth See admin instructions. Take one tablet (150 mg) once as needed for yeast infection from antibiotic, may repeat in 3 days if still needed. Patient not taking: No sig reported 04/15/21   [provider]  folic acid (FOLVITE) 1 MG tablet Take 1 tablet (1 mg total) by mouth daily. 04/25/21 05/25/21  Antonieta Pert, MD  insulin aspart (NOVOLOG) 100 UNIT/ML FlexPen Inject 4 Units into the skin See admin instructions. Inject 4 units subcutaneously three times daily after meals - plus sliding scale adjustment as  directed up to 30 units daily    [provider]  Insulin Degludec (TRESIBA FLEXTOUCH) 100 UNIT/ML SOPN Inject 7 Units into the skin daily. Patient taking differently: Inject 16 Units into the skin daily after breakfast. 12/10/15   Nita Sells, MD  ipratropium-albuterol (DUONEB) 0.5-2.5 (3) MG/3ML SOLN Take 3 mLs by nebulization every 6 (six) hours as needed for up to 15 days. 04/25/21 05/10/21  Antonieta Pert, MD  latanoprost (XALATAN) 0.005 % ophthalmic solution Place 1 drop into both eyes at bedtime. 04/10/21   [provider]  levothyroxine (SYNTHROID) 50 MCG tablet Take 50 mcg by mouth daily before breakfast.    [provider]  losartan (COZAAR) 50 MG tablet Take 50 mg by mouth every morning.    [provider]  methazolamide (NEPTAZANE) 50 MG tablet Take 50 mg by mouth 2 (two) times daily. 04/02/21   [provider]  Multiple Vitamin (MULTIVITAMIN WITH MINERALS) TABS tablet Take 1 tablet by mouth every morning.    [provider]  Omega-3 Fatty Acids (FISH OIL) 1000 MG CAPS Take 2,000 mg by mouth every morning.    [provider]  Pancrelipase, Lip-Prot-Amyl, 24000-76000 units CPEP Take 2 capsules by mouth daily after breakfast.    [provider]  prednisoLONE acetate (PRED FORTE) 1 % ophthalmic suspension Place 1 drop into the right eye 4 (four) times daily. 04/08/21   [provider]  pregabalin (LYRICA) 300 MG capsule Take 300 mg by mouth at bedtime. 02/05/21   [provider]  sertraline (ZOLOFT) 100 MG tablet Take 100 mg by mouth every morning.    [provider]  sodium bicarbonate 650 MG tablet Take 1 tablet (650 mg total) by mouth 2 (two) times daily for 7 days. 04/25/21 05/02/21  Antonieta Pert, MD  sodium chloride 1 g tablet Take 1 g by mouth 3 (three) times daily as needed (brain fog from sodium insufficiency). 12/24/20   [provider]  vitamin B-12 (CYANOCOBALAMIN) 250 MCG  tablet Take 1 tablet (250 mcg total) by mouth daily. 04/25/21 05/25/21  Antonieta Pert, MD  Vitamin D, Ergocalciferol, (DRISDOL) 1.25 MG (50000 UNIT) CAPS capsule Take 50,000 Units by mouth every Sunday. 01/28/21   [provider]    Allergies    Dorzolamide hcl-timolol mal, Brimonidine, and Netarsudil-latanoprost  Review of Systems   Review of Systems  Constitutional:  Positive for fatigue. Negative for chills and fever.  HENT:  Positive for nosebleeds. Negative for congestion.   Eyes:  Negative for visual disturbance.  Respiratory:  Positive for shortness of breath.   Cardiovascular:  Negative for chest pain.  Gastrointestinal:  Negative for abdominal pain, diarrhea and vomiting.  Genitourinary:  Negative for dysuria.  Skin:  Negative for rash.  Neurological:  Negative for headaches.   Physical Exam Updated Vital Signs BP (!) 154/56 (BP Location: Right Arm)   Pulse 74   Temp 99.3 F (37.4 C)  Resp (!) 30   Ht 5\' 9"  (1.753 m)   Wt 64.4 kg   SpO2 96%   BMI 20.97 kg/m   Physical Exam Vitals and nursing Nicholson reviewed.  Constitutional:      Appearance: Normal appearance.  HENT:     Head: Normocephalic.     Nose:     Comments: Dried blood around the left nares and upper lip without any active bleeding, no anterior vessel noted    Mouth/Throat:     Mouth: Mucous membranes are moist.     Comments: No bleeding in the posterior oropharynx Eyes:     Pupils: Pupils are equal, round, and reactive to light.  Cardiovascular:     Rate and Rhythm: Normal rate.  Pulmonary:     Comments: Conversationally tachypneic, nasal cannula in place with oxygen saturation greater than 95% Abdominal:     Palpations: Abdomen is soft.     Tenderness: There is no abdominal tenderness.  Skin:    General: Skin is warm.  Neurological:     Mental Status: She is alert and oriented to person, place, and time. Mental status is at baseline.  Psychiatric:        Mood and Affect: Mood normal.     ED Results / Procedures / Treatments   Labs (all labs ordered are listed, but only abnormal results are displayed) Labs Reviewed  CBC WITH DIFFERENTIAL/PLATELET  BASIC METABOLIC PANEL    EKG None  Radiology No results found.  Procedures Procedures   Medications Ordered in ED Medications - No data to display  ED Course  I have reviewed the triage vital signs and the nursing notes.  Pertinent labs & imaging results that were available during my care of the patient were reviewed by me and considered in my medical decision making (see chart for details).    MDM Rules/Calculators/A&P                           60 year old female presents emergency department with concern for epistaxis that has resolved.  Recently discharged for pneumonia and hypoxia, currently on nasal cannula oxygen.  Patient has been having some irritation and dry nose from the oxygen therapy.  I believe this is most likely the source of the epistaxis.  No active bleeding on arrival.  Patient is conversationally dyspneic but otherwise stable vitals.  Blood work shows baseline hyponatremia and anemia.  Mild hypokalemia that has been replaced in the department.  Chest x-ray otherwise looks stable.  Patient's had no further bleeding.  Feels well and wants to be discharged.  Patient at this time appears safe and stable for discharge and will be treated as an outpatient.  Discharge plan and strict return to ED precautions discussed, patient verbalizes understanding and agreement.  Final Clinical Impression(s) / ED Diagnoses Final diagnoses:  None    Rx / DC Orders ED Discharge Orders     None        Lorelle Gibbs, DO 04/26/21 0623

## 2021-04-26 NOTE — Discharge Instructions (Addendum)
You have been seen and discharged from the emergency department.  Your blood work is stable.  Your potassium was slightly low, you were given replacement here in the department.  Continue to use your oxygen and take prescribed medications as directed.  Follow-up with your primary provider for reevaluation and further care. Take home medications as prescribed. If you have any worsening symptoms, worsening shortness of breath, difficulty caring for self at home, recurrent epistaxis or further concerns for your health please return to an emergency department for further evaluation.

## 2021-04-26 NOTE — ED Notes (Signed)
Per RN Gerald Stabs ok for pt to have some ice chips. Pt given about 1/2 a cup

## 2021-06-25 ENCOUNTER — Other Ambulatory Visit: Payer: Self-pay

## 2021-11-27 ENCOUNTER — Other Ambulatory Visit: Payer: Self-pay

## 2021-11-27 ENCOUNTER — Encounter (HOSPITAL_BASED_OUTPATIENT_CLINIC_OR_DEPARTMENT_OTHER): Payer: Self-pay | Admitting: Emergency Medicine

## 2021-11-27 ENCOUNTER — Observation Stay (HOSPITAL_BASED_OUTPATIENT_CLINIC_OR_DEPARTMENT_OTHER)
Admission: EM | Admit: 2021-11-27 | Discharge: 2021-11-29 | Disposition: A | Discharge status: Home / Self Care | Payer: BC Managed Care – PPO | Source: Ambulatory Visit | Attending: Family Medicine | Admitting: Family Medicine

## 2021-11-27 ENCOUNTER — Emergency Department (HOSPITAL_BASED_OUTPATIENT_CLINIC_OR_DEPARTMENT_OTHER): Payer: BC Managed Care – PPO

## 2021-11-27 DIAGNOSIS — J449 Chronic obstructive pulmonary disease, unspecified: Secondary | ICD-10-CM | POA: Diagnosis not present

## 2021-11-27 DIAGNOSIS — E039 Hypothyroidism, unspecified: Secondary | ICD-10-CM | POA: Insufficient documentation

## 2021-11-27 DIAGNOSIS — I63132 Cerebral infarction due to embolism of left carotid artery: Secondary | ICD-10-CM | POA: Diagnosis not present

## 2021-11-27 DIAGNOSIS — Z720 Tobacco use: Secondary | ICD-10-CM | POA: Diagnosis present

## 2021-11-27 DIAGNOSIS — I639 Cerebral infarction, unspecified: Secondary | ICD-10-CM | POA: Diagnosis present

## 2021-11-27 DIAGNOSIS — I129 Hypertensive chronic kidney disease with stage 1 through stage 4 chronic kidney disease, or unspecified chronic kidney disease: Secondary | ICD-10-CM | POA: Diagnosis not present

## 2021-11-27 DIAGNOSIS — Z79890 Hormone replacement therapy (postmenopausal): Secondary | ICD-10-CM | POA: Diagnosis not present

## 2021-11-27 DIAGNOSIS — Z8249 Family history of ischemic heart disease and other diseases of the circulatory system: Secondary | ICD-10-CM | POA: Diagnosis not present

## 2021-11-27 DIAGNOSIS — E871 Hypo-osmolality and hyponatremia: Secondary | ICD-10-CM | POA: Diagnosis present

## 2021-11-27 DIAGNOSIS — F17210 Nicotine dependence, cigarettes, uncomplicated: Secondary | ICD-10-CM | POA: Diagnosis not present

## 2021-11-27 DIAGNOSIS — E1139 Type 2 diabetes mellitus with other diabetic ophthalmic complication: Secondary | ICD-10-CM | POA: Insufficient documentation

## 2021-11-27 DIAGNOSIS — Z794 Long term (current) use of insulin: Secondary | ICD-10-CM | POA: Diagnosis not present

## 2021-11-27 DIAGNOSIS — F32A Depression, unspecified: Secondary | ICD-10-CM | POA: Diagnosis not present

## 2021-11-27 DIAGNOSIS — E878 Other disorders of electrolyte and fluid balance, not elsewhere classified: Secondary | ICD-10-CM | POA: Diagnosis not present

## 2021-11-27 DIAGNOSIS — E1122 Type 2 diabetes mellitus with diabetic chronic kidney disease: Secondary | ICD-10-CM | POA: Diagnosis not present

## 2021-11-27 DIAGNOSIS — E1140 Type 2 diabetes mellitus with diabetic neuropathy, unspecified: Secondary | ICD-10-CM | POA: Diagnosis not present

## 2021-11-27 DIAGNOSIS — H42 Glaucoma in diseases classified elsewhere: Secondary | ICD-10-CM | POA: Insufficient documentation

## 2021-11-27 DIAGNOSIS — E785 Hyperlipidemia, unspecified: Secondary | ICD-10-CM | POA: Diagnosis not present

## 2021-11-27 DIAGNOSIS — Z7902 Long term (current) use of antithrombotics/antiplatelets: Secondary | ICD-10-CM

## 2021-11-27 DIAGNOSIS — I69398 Other sequelae of cerebral infarction: Secondary | ICD-10-CM | POA: Diagnosis not present

## 2021-11-27 DIAGNOSIS — N1831 Chronic kidney disease, stage 3a: Secondary | ICD-10-CM | POA: Diagnosis not present

## 2021-11-27 DIAGNOSIS — E1165 Type 2 diabetes mellitus with hyperglycemia: Secondary | ICD-10-CM

## 2021-11-27 DIAGNOSIS — R4781 Slurred speech: Secondary | ICD-10-CM | POA: Insufficient documentation

## 2021-11-27 DIAGNOSIS — R42 Dizziness and giddiness: Principal | ICD-10-CM

## 2021-11-27 DIAGNOSIS — Z01810 Encounter for preprocedural cardiovascular examination: Secondary | ICD-10-CM

## 2021-11-27 DIAGNOSIS — I2510 Atherosclerotic heart disease of native coronary artery without angina pectoris: Secondary | ICD-10-CM | POA: Diagnosis present

## 2021-11-27 DIAGNOSIS — N179 Acute kidney failure, unspecified: Secondary | ICD-10-CM | POA: Diagnosis present

## 2021-11-27 DIAGNOSIS — N390 Urinary tract infection, site not specified: Secondary | ICD-10-CM | POA: Diagnosis not present

## 2021-11-27 DIAGNOSIS — I10 Essential (primary) hypertension: Secondary | ICD-10-CM | POA: Diagnosis present

## 2021-11-27 DIAGNOSIS — G8191 Hemiplegia, unspecified affecting right dominant side: Secondary | ICD-10-CM | POA: Diagnosis present

## 2021-11-27 DIAGNOSIS — Z8701 Personal history of pneumonia (recurrent): Secondary | ICD-10-CM | POA: Insufficient documentation

## 2021-11-27 DIAGNOSIS — Z7982 Long term (current) use of aspirin: Secondary | ICD-10-CM

## 2021-11-27 DIAGNOSIS — E876 Hypokalemia: Secondary | ICD-10-CM | POA: Diagnosis present

## 2021-11-27 DIAGNOSIS — E1151 Type 2 diabetes mellitus with diabetic peripheral angiopathy without gangrene: Secondary | ICD-10-CM | POA: Insufficient documentation

## 2021-11-27 DIAGNOSIS — Z833 Family history of diabetes mellitus: Secondary | ICD-10-CM | POA: Insufficient documentation

## 2021-11-27 DIAGNOSIS — H9191 Unspecified hearing loss, right ear: Secondary | ICD-10-CM | POA: Diagnosis not present

## 2021-11-27 DIAGNOSIS — Z841 Family history of disorders of kidney and ureter: Secondary | ICD-10-CM

## 2021-11-27 DIAGNOSIS — Z87440 Personal history of urinary (tract) infections: Secondary | ICD-10-CM

## 2021-11-27 DIAGNOSIS — R5383 Other fatigue: Secondary | ICD-10-CM | POA: Diagnosis present

## 2021-11-27 DIAGNOSIS — Z888 Allergy status to other drugs, medicaments and biological substances status: Secondary | ICD-10-CM | POA: Insufficient documentation

## 2021-11-27 DIAGNOSIS — R29700 NIHSS score 0: Secondary | ICD-10-CM | POA: Diagnosis present

## 2021-11-27 DIAGNOSIS — I6522 Occlusion and stenosis of left carotid artery: Secondary | ICD-10-CM | POA: Diagnosis present

## 2021-11-27 DIAGNOSIS — H409 Unspecified glaucoma: Secondary | ICD-10-CM | POA: Diagnosis present

## 2021-11-27 DIAGNOSIS — Z79899 Other long term (current) drug therapy: Secondary | ICD-10-CM

## 2021-11-27 DIAGNOSIS — M1990 Unspecified osteoarthritis, unspecified site: Secondary | ICD-10-CM | POA: Insufficient documentation

## 2021-11-27 DIAGNOSIS — Z7951 Long term (current) use of inhaled steroids: Secondary | ICD-10-CM

## 2021-11-27 DIAGNOSIS — R8281 Pyuria: Secondary | ICD-10-CM

## 2021-11-27 DIAGNOSIS — I452 Bifascicular block: Secondary | ICD-10-CM | POA: Diagnosis present

## 2021-11-27 HISTORY — DX: Cerebral infarction, unspecified: I63.9

## 2021-11-27 LAB — URINALYSIS, ROUTINE W REFLEX MICROSCOPIC
Bilirubin Urine: NEGATIVE
Glucose, UA: 100 mg/dL — AB
Ketones, ur: NEGATIVE mg/dL
Nitrite: POSITIVE — AB
Protein, ur: NEGATIVE mg/dL
Specific Gravity, Urine: 1.015 (ref 1.005–1.030)
pH: 5.5 (ref 5.0–8.0)

## 2021-11-27 LAB — CBC WITH DIFFERENTIAL/PLATELET
Abs Immature Granulocytes: 0.11 10*3/uL — ABNORMAL HIGH (ref 0.00–0.07)
Basophils Absolute: 0.1 10*3/uL (ref 0.0–0.1)
Basophils Relative: 1 %
Eosinophils Absolute: 0.2 10*3/uL (ref 0.0–0.5)
Eosinophils Relative: 2 %
HCT: 29.4 % — ABNORMAL LOW (ref 36.0–46.0)
Hemoglobin: 9.9 g/dL — ABNORMAL LOW (ref 12.0–15.0)
Immature Granulocytes: 1 %
Lymphocytes Relative: 31 %
Lymphs Abs: 2.5 10*3/uL (ref 0.7–4.0)
MCH: 30.6 pg (ref 26.0–34.0)
MCHC: 33.7 g/dL (ref 30.0–36.0)
MCV: 90.7 fL (ref 80.0–100.0)
Monocytes Absolute: 0.7 10*3/uL (ref 0.1–1.0)
Monocytes Relative: 9 %
Neutro Abs: 4.5 10*3/uL (ref 1.7–7.7)
Neutrophils Relative %: 56 %
Platelets: 223 10*3/uL (ref 150–400)
RBC: 3.24 MIL/uL — ABNORMAL LOW (ref 3.87–5.11)
RDW: 14 % (ref 11.5–15.5)
WBC: 8 10*3/uL (ref 4.0–10.5)
nRBC: 0 % (ref 0.0–0.2)

## 2021-11-27 LAB — URINALYSIS, MICROSCOPIC (REFLEX): WBC, UA: >50 WBC/hpf (ref 0–5)

## 2021-11-27 LAB — COMPREHENSIVE METABOLIC PANEL
ALT: 12 U/L (ref 0–44)
AST: 18 U/L (ref 15–41)
Albumin: 3.5 g/dL (ref 3.5–5.0)
Alkaline Phosphatase: 84 U/L (ref 38–126)
Anion gap: 6 (ref 5–15)
BUN: 16 mg/dL (ref 8–23)
CO2: 16 mmol/L — ABNORMAL LOW (ref 22–32)
Calcium: 8.2 mg/dL — ABNORMAL LOW (ref 8.9–10.3)
Chloride: 108 mmol/L (ref 98–111)
Creatinine, Ser: 1.31 mg/dL — ABNORMAL HIGH (ref 0.44–1.00)
GFR, Estimated: 46 mL/min — ABNORMAL LOW (ref >60)
Glucose, Bld: 149 mg/dL — ABNORMAL HIGH (ref 70–99)
Potassium: 3.3 mmol/L — ABNORMAL LOW (ref 3.5–5.1)
Sodium: 130 mmol/L — ABNORMAL LOW (ref 135–145)
Total Bilirubin: 0.5 mg/dL (ref 0.3–1.2)
Total Protein: 6.4 g/dL — ABNORMAL LOW (ref 6.5–8.1)

## 2021-11-27 NOTE — ED Triage Notes (Signed)
Pt states for the past two months her equilibrium is off and has been getting progressively worse   Pt has hx of a stroke  Pt went to urgent care today and was sent here to have a CT of her head  Pt states she has been having recurrent UTIs and is currently on an antibiotic  Pt states also her sleep pattern is different   ?

## 2021-11-27 NOTE — ED Provider Notes (Signed)
[facility] Provider Note   CSN: [id number] Arrival date & time: 11/27/21  2044     History  Chief Complaint  Patient presents with   equilibrium feels off    Michelle Nicholson is a 61 y.o. female who presents the emergency department for "equilibrium being off".  Patient states for that for the past 2 months she has had difficulties with her balance, leading to several falls.  She states she does not feel dizzy, but when she looks up too quickly she feels herself falling to the side.  She states a family member commented on her speech being slightly slurred, and she feels that this is worse at night.  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  She states that she is flying to [place] for vacation, and went to urgent care today to make sure she was safe to fly.  Urgent care sent her here to have a CT of her head, with concern for past stroke with similar symptoms.  She feels as though her symptoms have gradually worsened in the past 2 weeks.  No head trauma. She reports recurrent UTIs, and is currently on antibiotic for this.  HPI     Home Medications Prior to Admission medications   Medication Sig Start Date End Date Taking? Authorizing Provider  acetaminophen (TYLENOL) 500 MG tablet Take 1,000 mg by mouth every 6 (six) hours as needed for headache (pain).    [provider]  albuterol (VENTOLIN HFA) 108 (90 Base) MCG/ACT inhaler Inhale 1 puff into the lungs every 6 (six) hours as needed for wheezing or shortness of breath. 10/22/17   [provider]  amLODipine (NORVASC) 2.5 MG tablet Take 2.5 mg by mouth at bedtime. 04/18/21   [provider]  amLODipine (NORVASC) 5 MG tablet Take 5 mg by mouth every morning. 03/14/21   [provider]  aspirin 81 MG EC tablet Take 81 mg by mouth every morning.    [provider]  atorvastatin (LIPITOR) 40 MG  tablet Take 40 mg by mouth every morning. 04/01/21   [provider]  buPROPion (WELLBUTRIN XL) 150 MG 24 hr tablet Take 150 mg by mouth every morning. 03/05/21   [provider]  Calcium-Magnesium-Zinc (CAL-MAG-ZINC PO) Take 1 tablet by mouth every morning.    [provider]  clopidogrel (PLAVIX) 75 MG tablet Take 75 mg by mouth every morning. 04/18/21   [provider]  ferrous sulfate 325 (65 FE) MG tablet Take 1 tablet (325 mg total) by mouth 2 (two) times daily with a meal. 04/25/21 05/25/21  Antonieta Pert, MD  fluconazole (DIFLUCAN) 150 MG tablet Take 150 mg by mouth See admin instructions. Take one tablet (150 mg) once as needed for yeast infection from antibiotic, may repeat in 3 days if still needed. Patient not taking: No sig reported 04/15/21   [provider]  insulin aspart (NOVOLOG) 100 UNIT/ML FlexPen Inject 4 Units into the skin See admin instructions. Inject 4 units subcutaneously three times daily after meals - plus sliding scale adjustment as directed up to 30 units daily    [provider]  Insulin Degludec (TRESIBA FLEXTOUCH) 100 UNIT/ML SOPN Inject 7 Units into the skin daily. Patient taking differently: Inject 16 Units into the skin daily after breakfast. 12/10/15   Nita Sells, MD  ipratropium-albuterol (DUONEB) 0.5-2.5 (3) MG/3ML SOLN Take 3 mLs by nebulization every 6 (  six) hours as needed for up to 15 days. 04/25/21 05/10/21  Antonieta Pert, MD  latanoprost (XALATAN) 0.005 % ophthalmic solution Place 1 drop into both eyes at bedtime. 04/10/21   [provider]  levothyroxine (SYNTHROID) 50 MCG tablet Take 50 mcg by mouth daily before breakfast.    [provider]  losartan (COZAAR) 50 MG tablet Take 50 mg by mouth every morning.    [provider]  methazolamide (NEPTAZANE) 50 MG tablet Take 50 mg by mouth 2 (two) times daily. 04/02/21   [provider]  Multiple Vitamin (MULTIVITAMIN WITH  MINERALS) TABS tablet Take 1 tablet by mouth every morning.    [provider]  Omega-3 Fatty Acids (FISH OIL) 1000 MG CAPS Take 2,000 mg by mouth every morning.    [provider]  Pancrelipase, Lip-Prot-Amyl, 24000-76000 units CPEP Take 2 capsules by mouth daily after breakfast.    [provider]  prednisoLONE acetate (PRED FORTE) 1 % ophthalmic suspension Place 1 drop into the right eye 4 (four) times daily. 04/08/21   [provider]  pregabalin (LYRICA) 300 MG capsule Take 300 mg by mouth at bedtime. 02/05/21   [provider]  sertraline (ZOLOFT) 100 MG tablet Take 100 mg by mouth every morning.    [provider]  sodium chloride 1 g tablet Take 1 g by mouth 3 (three) times daily as needed (brain fog from sodium insufficiency). 12/24/20   [provider]  Vitamin D, Ergocalciferol, (DRISDOL) 1.25 MG (50000 UNIT) CAPS capsule Take 50,000 Units by mouth every Sunday. 01/28/21   [provider]      Allergies    Dorzolamide hcl-timolol mal, Brimonidine, and Netarsudil-latanoprost    Review of Systems   Review of Systems  Constitutional:  Negative for fever.  HENT:  Negative for ear pain, hearing loss and tinnitus.   Eyes:  Negative for visual disturbance.  Neurological:  Positive for speech difficulty. Negative for dizziness, syncope, facial asymmetry, weakness, light-headedness, numbness and headaches.       Difficulties with balance  All other systems reviewed and are negative.  Physical Exam Updated Vital Signs BP (!) 141/59   Pulse 62   Temp 99 F (37.2 C) (Oral)   Resp 16   Ht '5\' 9"'$  (1.753 m)   Wt 68 kg   SpO2 96%   BMI 22.15 kg/m  Physical Exam Vitals and nursing note reviewed.  Constitutional:      Appearance: Normal appearance.  HENT:     Head: Normocephalic and atraumatic.  Eyes:     Conjunctiva/sclera: Conjunctivae normal.  Cardiovascular:     Rate and Rhythm: Normal rate and regular rhythm.   Pulmonary:     Effort: Pulmonary effort is normal. No respiratory distress.     Breath sounds: Normal breath sounds.  Abdominal:     General: There is no distension.     Palpations: Abdomen is soft.     Tenderness: There is no abdominal tenderness.  Skin:    General: Skin is warm and dry.  Neurological:     General: No focal deficit present.     Mental Status: She is alert.     Gait: Gait is intact.     Comments: Neuro: Speech is clear, able to follow commands. 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 PERRLA. EOMI. Sensation intact throughout. Str 5/5 all extremities.    ED Results / Procedures / Treatments   Labs (all labs ordered  are listed, but only abnormal results are displayed) Labs Reviewed  CBC WITH DIFFERENTIAL/PLATELET - Abnormal; Notable for the following components:      Result Value   RBC 3.24 (*)    Hemoglobin 9.9 (*)    HCT 29.4 (*)    Abs Immature Granulocytes 0.11 (*)    All other components within normal limits  COMPREHENSIVE METABOLIC PANEL - Abnormal; Notable for the following components:   Sodium 130 (*)    Potassium 3.3 (*)    CO2 16 (*)    Glucose, Bld 149 (*)    Creatinine, Ser 1.31 (*)    Calcium 8.2 (*)    Total Protein 6.4 (*)    GFR, Estimated 46 (*)    All other components within normal limits  URINALYSIS, ROUTINE W REFLEX MICROSCOPIC - Abnormal; Notable for the following components:   APPearance CLOUDY (*)    Glucose, UA 100 (*)    Hgb urine dipstick TRACE (*)    Nitrite POSITIVE (*)    Leukocytes,Ua SMALL (*)    All other components within normal limits  URINALYSIS, MICROSCOPIC (REFLEX) - Abnormal; Notable for the following components:   Bacteria, UA MANY (*)    All other components within normal limits    EKG None  Radiology CT Head Wo Contrast  Result Date: 11/27/2021 CLINICAL DATA:  Stroke-like symptoms EXAM: CT HEAD WITHOUT CONTRAST TECHNIQUE: Contiguous axial images were obtained from  the base of the skull through the vertex without intravenous contrast. RADIATION DOSE REDUCTION: This exam was performed according to the departmental dose-optimization program which includes automated exposure control, adjustment of the mA and/or kV according to patient size and/or use of iterative reconstruction technique. COMPARISON:  06/02/2021 FINDINGS: Brain: No evidence of acute infarction, hemorrhage, hydrocephalus, extra-axial collection or mass lesion/mass effect. Vascular: No hyperdense vessel or unexpected calcification. Skull: Normal. Negative for fracture or focal lesion. Sinuses/Orbits: No acute finding. Other: None. IMPRESSION: No acute intracranial abnormality noted. Electronically Signed   By: Inez Catalina M.D.   On: 11/27/2021 21:33    Procedures Procedures    Medications Ordered in ED Medications - No data to display  ED Course/ Medical Decision Making/ A&P                           Medical Decision Making Amount and/or Complexity of Data Reviewed Labs: ordered. Radiology: ordered.  This patient is a 61 y.o. female who presents to the ED for concern of balance issues, this involves an extensive number of treatment options, and is a complaint that carries with it a high risk of complications and morbidity. The emergent differential diagnosis prior to evaluation includes, but is not limited to,  vertigo, AVM, cerebellar tumor, MS, drug related, stroke. This is not an exhaustive differential.   Past Medical History / Co-morbidities / Social History: Type 2 diabetes, hypertension, Meniere syndrome, peripheral vascular disease, stroke  Additional history: Chart reviewed. Pertinent results include: Patient was seen for similar symptoms back in 2017, in which she was admitted for severe vertigo and had an MRI performed.  At that time there was no evidence of acute stroke, and she was discharged with neurology follow-up.  Physical Exam: Physical exam performed. The pertinent  findings include: Normal neurologic exam as above.  Patient describes equilibrium problem, no nystagmus.  Normal gait.  Lab Tests: I ordered, and personally interpreted labs.  I also personally interpreted prior labs.  The pertinent results include: Hyponatremia of  130, stable compared to prior.  Hemoglobin 9.9, stable compared to prior.  Urinalysis with evidence of infection, consistent with patient's history of recurrent UTIs and is currently being treated.   Imaging Studies: I ordered imaging studies including CT head. I independently visualized and interpreted imaging which showed acute intracranial abnormalities. I agree with the radiologist interpretation.   Consultations Obtained: I requested consultation with accepting ER physician Dr Sedonia Small,  and discussed lab and imaging findings as well as pertinent plan - he will accept patient transfer to [facility] for MRI.    Disposition: After consideration of the diagnostic results and the patients response to treatment, I feel that patient is needing additional imaging to rule out TIA versus stroke as the cause of her symptoms.  While patient has had worsening of this equilibrium for the past several months, I am concerned that she may have had a prior stroke which precipitated this.  We will transfer to [facility] for MRI.  If patient scan is abnormal, she will require admission to find the etiology of the stroke.  If her scan is normal, she does have a neurologist to follow with outpatient.  I discussed this case with my attending physician Dr. Tamera Punt who cosigned this note including patient's presenting symptoms, physical exam, and planned diagnostics and interventions. Attending physician stated agreement with plan or made changes to plan which were implemented.     Final Clinical Impression(s) / ED Diagnoses Final diagnoses:  Loss of equilibrium    Rx / DC Orders ED Discharge Orders     None      Portions of this report may have been  transcribed using voice recognition software. Every effort was made to ensure accuracy; however, inadvertent computerized transcription errors may be present.    Estill Cotta 11/28/21 0028    Malvin Johns, MD 11/28/21 [phone]

## 2021-11-27 NOTE — ED Notes (Signed)
Patient transported to CT 

## 2021-11-28 ENCOUNTER — Emergency Department (HOSPITAL_COMMUNITY): Payer: BC Managed Care – PPO

## 2021-11-28 ENCOUNTER — Inpatient Hospital Stay (HOSPITAL_COMMUNITY): Payer: BC Managed Care – PPO

## 2021-11-28 ENCOUNTER — Observation Stay (HOSPITAL_COMMUNITY): Payer: BC Managed Care – PPO

## 2021-11-28 ENCOUNTER — Inpatient Hospital Stay (HOSPITAL_BASED_OUTPATIENT_CLINIC_OR_DEPARTMENT_OTHER): Payer: BC Managed Care – PPO

## 2021-11-28 DIAGNOSIS — N179 Acute kidney failure, unspecified: Secondary | ICD-10-CM

## 2021-11-28 DIAGNOSIS — E1165 Type 2 diabetes mellitus with hyperglycemia: Secondary | ICD-10-CM

## 2021-11-28 DIAGNOSIS — Z794 Long term (current) use of insulin: Secondary | ICD-10-CM

## 2021-11-28 DIAGNOSIS — I6389 Other cerebral infarction: Secondary | ICD-10-CM | POA: Diagnosis not present

## 2021-11-28 DIAGNOSIS — E785 Hyperlipidemia, unspecified: Secondary | ICD-10-CM | POA: Diagnosis not present

## 2021-11-28 DIAGNOSIS — I6522 Occlusion and stenosis of left carotid artery: Secondary | ICD-10-CM | POA: Diagnosis not present

## 2021-11-28 DIAGNOSIS — I779 Disorder of arteries and arterioles, unspecified: Secondary | ICD-10-CM | POA: Diagnosis not present

## 2021-11-28 DIAGNOSIS — N390 Urinary tract infection, site not specified: Secondary | ICD-10-CM

## 2021-11-28 DIAGNOSIS — E871 Hypo-osmolality and hyponatremia: Secondary | ICD-10-CM | POA: Diagnosis not present

## 2021-11-28 DIAGNOSIS — I639 Cerebral infarction, unspecified: Secondary | ICD-10-CM | POA: Diagnosis not present

## 2021-11-28 DIAGNOSIS — E876 Hypokalemia: Secondary | ICD-10-CM

## 2021-11-28 DIAGNOSIS — R5383 Other fatigue: Secondary | ICD-10-CM | POA: Diagnosis present

## 2021-11-28 DIAGNOSIS — Z720 Tobacco use: Secondary | ICD-10-CM

## 2021-11-28 DIAGNOSIS — I10 Essential (primary) hypertension: Secondary | ICD-10-CM

## 2021-11-28 LAB — ECHOCARDIOGRAM COMPLETE
AR max vel: 3.38 cm2
AV Peak grad: 4.6 mmHg
Ao pk vel: 1.07 m/s
Area-P 1/2: 1.56 cm2
Height: 69 in
S' Lateral: 2.7 cm
Weight: 2400 oz

## 2021-11-28 LAB — CBG MONITORING, ED
Glucose-Capillary: 109 mg/dL — ABNORMAL HIGH (ref 70–99)
Glucose-Capillary: 256 mg/dL — ABNORMAL HIGH (ref 70–99)

## 2021-11-28 LAB — LIPID PANEL
Cholesterol: 85 mg/dL (ref 0–200)
HDL: 31 mg/dL — ABNORMAL LOW (ref >40)
LDL Cholesterol: 40 mg/dL (ref 0–99)
Total CHOL/HDL Ratio: 2.7 RATIO
Triglycerides: 68 mg/dL (ref <150)
VLDL: 14 mg/dL (ref 0–40)

## 2021-11-28 LAB — RAPID URINE DRUG SCREEN, HOSP PERFORMED
Amphetamines: NOT DETECTED
Barbiturates: NOT DETECTED
Benzodiazepines: NOT DETECTED
Cocaine: NOT DETECTED
Opiates: NOT DETECTED
Tetrahydrocannabinol: NOT DETECTED

## 2021-11-28 LAB — GLUCOSE, CAPILLARY
Glucose-Capillary: 111 mg/dL — ABNORMAL HIGH (ref 70–99)
Glucose-Capillary: 115 mg/dL — ABNORMAL HIGH (ref 70–99)

## 2021-11-28 LAB — HEMOGLOBIN A1C
Hgb A1c MFr Bld: 7.8 % — ABNORMAL HIGH (ref 4.8–5.6)
Mean Plasma Glucose: 177.16 mg/dL

## 2021-11-28 LAB — SODIUM, URINE, RANDOM: Sodium, Ur: 44 mmol/L

## 2021-11-28 LAB — CREATININE, URINE, RANDOM: Creatinine, Urine: 15.53 mg/dL

## 2021-11-28 MED ORDER — SERTRALINE HCL 100 MG PO TABS
100.0000 mg | ORAL_TABLET | Freq: Every morning | ORAL | Status: DC
  Administered 2021-11-28 – 2021-11-29 (×2): 100 mg via ORAL
  Filled 2021-11-28 (×2): qty 1

## 2021-11-28 MED ORDER — FLUOROURACIL 5 % EX CREA: 1.0000 "application " | TOPICAL_CREAM | Freq: Every day | CUTANEOUS | Status: DC | PRN

## 2021-11-28 MED ORDER — INSULIN ASPART 100 UNIT/ML IJ SOLN
4.0000 [IU] | Freq: Three times a day (TID) | INTRAMUSCULAR | Status: DC
  Administered 2021-11-28 – 2021-11-29 (×4): 4 [IU] via SUBCUTANEOUS

## 2021-11-28 MED ORDER — SODIUM CHLORIDE 1 G PO TABS: 1.0000 g | ORAL_TABLET | Freq: Three times a day (TID) | ORAL | Status: DC | PRN

## 2021-11-28 MED ORDER — CLOPIDOGREL BISULFATE 75 MG PO TABS
75.0000 mg | ORAL_TABLET | Freq: Every day | ORAL | Status: DC
Start: 2021-11-28 — End: 2021-11-29
  Administered 2021-11-28 – 2021-11-29 (×2): 75 mg via ORAL
  Filled 2021-11-28 (×2): qty 1

## 2021-11-28 MED ORDER — INSULIN DEGLUDEC 100 UNIT/ML ~~LOC~~ SOPN: 16.0000 [IU] | PEN_INJECTOR | Freq: Every day | SUBCUTANEOUS | Status: DC

## 2021-11-28 MED ORDER — LATANOPROST 0.005 % OP SOLN
1.0000 [drp] | Freq: Every day | OPHTHALMIC | Status: DC
  Administered 2021-11-28: 1 [drp] via OPHTHALMIC
  Filled 2021-11-28: qty 2.5

## 2021-11-28 MED ORDER — BUPROPION HCL ER (XL) 150 MG PO TB24
150.0000 mg | ORAL_TABLET | Freq: Every morning | ORAL | Status: DC
  Administered 2021-11-28 – 2021-11-29 (×2): 150 mg via ORAL
  Filled 2021-11-28 (×2): qty 1

## 2021-11-28 MED ORDER — AMLODIPINE BESYLATE 2.5 MG PO TABS
2.5000 mg | ORAL_TABLET | Freq: Every day | ORAL | Status: DC
  Administered 2021-11-28: 2.5 mg via ORAL
  Filled 2021-11-28: qty 1

## 2021-11-28 MED ORDER — ENOXAPARIN SODIUM 40 MG/0.4ML IJ SOSY
40.0000 mg | PREFILLED_SYRINGE | INTRAMUSCULAR | Status: DC
  Administered 2021-11-28 – 2021-11-29 (×2): 40 mg via SUBCUTANEOUS
  Filled 2021-11-28 (×2): qty 0.4

## 2021-11-28 MED ORDER — SODIUM CHLORIDE 0.9 % IV SOLN
1.0000 g | INTRAVENOUS | Status: DC
  Administered 2021-11-28 – 2021-11-29 (×2): 1 g via INTRAVENOUS
  Filled 2021-11-28 (×2): qty 10

## 2021-11-28 MED ORDER — FERROUS SULFATE 325 (65 FE) MG PO TABS
325.0000 mg | ORAL_TABLET | Freq: Every morning | ORAL | Status: DC
  Administered 2021-11-28 – 2021-11-29 (×2): 325 mg via ORAL
  Filled 2021-11-28 (×2): qty 1

## 2021-11-28 MED ORDER — POTASSIUM CHLORIDE CRYS ER 20 MEQ PO TBCR
40.0000 meq | EXTENDED_RELEASE_TABLET | ORAL | Status: AC
  Administered 2021-11-28: 40 meq via ORAL
  Filled 2021-11-28: qty 2

## 2021-11-28 MED ORDER — AMLODIPINE BESYLATE 5 MG PO TABS
5.0000 mg | ORAL_TABLET | Freq: Every morning | ORAL | Status: DC
  Administered 2021-11-29: 5 mg via ORAL
  Filled 2021-11-28: qty 1

## 2021-11-28 MED ORDER — ASPIRIN 325 MG PO TABS
325.0000 mg | ORAL_TABLET | Freq: Every day | ORAL | Status: DC
  Administered 2021-11-28 – 2021-11-29 (×2): 325 mg via ORAL
  Filled 2021-11-28 (×2): qty 1

## 2021-11-28 MED ORDER — IOHEXOL 350 MG/ML SOLN
100.0000 mL | Freq: Once | INTRAVENOUS | Status: AC | PRN
  Administered 2021-11-28: 50 mL via INTRAVENOUS

## 2021-11-28 MED ORDER — SODIUM CHLORIDE 0.9 % IV SOLN: INTRAVENOUS | Status: DC

## 2021-11-28 MED ORDER — ATORVASTATIN CALCIUM 80 MG PO TABS
80.0000 mg | ORAL_TABLET | Freq: Every day | ORAL | Status: DC
  Administered 2021-11-28: 80 mg via ORAL
  Filled 2021-11-28: qty 1

## 2021-11-28 MED ORDER — SENNOSIDES-DOCUSATE SODIUM 8.6-50 MG PO TABS: 1.0000 | ORAL_TABLET | Freq: Every evening | ORAL | Status: DC | PRN

## 2021-11-28 MED ORDER — OXYBUTYNIN CHLORIDE ER 10 MG PO TB24
10.0000 mg | ORAL_TABLET | Freq: Every morning | ORAL | Status: DC
  Administered 2021-11-29: 10 mg via ORAL
  Filled 2021-11-28 (×2): qty 1

## 2021-11-28 MED ORDER — NICOTINE 21 MG/24HR TD PT24
21.0000 mg | MEDICATED_PATCH | Freq: Every day | TRANSDERMAL | Status: DC
Start: 2021-11-28 — End: 2021-11-29
  Administered 2021-11-28 – 2021-11-29 (×2): 21 mg via TRANSDERMAL
  Filled 2021-11-28 (×2): qty 1

## 2021-11-28 MED ORDER — INSULIN GLARGINE-YFGN 100 UNIT/ML ~~LOC~~ SOLN
16.0000 [IU] | Freq: Every day | SUBCUTANEOUS | Status: DC
  Administered 2021-11-28 – 2021-11-29 (×2): 16 [IU] via SUBCUTANEOUS
  Filled 2021-11-28 (×2): qty 0.16

## 2021-11-28 MED ORDER — STROKE: EARLY STAGES OF RECOVERY BOOK
Freq: Once | Status: AC
  Filled 2021-11-28: qty 1

## 2021-11-28 MED ORDER — STROKE: EARLY STAGES OF RECOVERY BOOK: Freq: Once | Status: DC

## 2021-11-28 MED ORDER — ACETAMINOPHEN 650 MG RE SUPP: 650.0000 mg | Freq: Four times a day (QID) | RECTAL | Status: DC | PRN

## 2021-11-28 MED ORDER — PREGABALIN 100 MG PO CAPS
300.0000 mg | ORAL_CAPSULE | Freq: Two times a day (BID) | ORAL | Status: DC
  Administered 2021-11-28 – 2021-11-29 (×3): 300 mg via ORAL
  Filled 2021-11-28 (×3): qty 3

## 2021-11-28 MED ORDER — SODIUM CHLORIDE 0.9 % IV SOLN: Freq: Once | INTRAVENOUS | Status: DC

## 2021-11-28 MED ORDER — FOLIC ACID 1 MG PO TABS
1.0000 mg | ORAL_TABLET | Freq: Every morning | ORAL | Status: DC
  Administered 2021-11-28 – 2021-11-29 (×2): 1 mg via ORAL
  Filled 2021-11-28 (×2): qty 1

## 2021-11-28 MED ORDER — INSULIN ASPART 100 UNIT/ML IJ SOLN: 0.0000 [IU] | Freq: Every day | INTRAMUSCULAR | Status: DC

## 2021-11-28 MED ORDER — VITAMIN B-12 100 MCG PO TABS
100.0000 ug | ORAL_TABLET | Freq: Every morning | ORAL | Status: DC
Start: 2021-11-28 — End: 2021-11-29
  Administered 2021-11-29: 100 ug via ORAL
  Filled 2021-11-28: qty 1

## 2021-11-28 MED ORDER — GADOBUTROL 1 MMOL/ML IV SOLN
7.0000 mL | Freq: Once | INTRAVENOUS | Status: AC | PRN
  Administered 2021-11-28: 7 mL via INTRAVENOUS

## 2021-11-28 MED ORDER — LEVOTHYROXINE SODIUM 50 MCG PO TABS
50.0000 ug | ORAL_TABLET | Freq: Every day | ORAL | Status: DC
  Administered 2021-11-28 – 2021-11-29 (×2): 50 ug via ORAL
  Filled 2021-11-28 (×2): qty 1

## 2021-11-28 MED ORDER — PANCRELIPASE (LIP-PROT-AMYL) 12000-38000 UNITS PO CPEP
24000.0000 [IU] | ORAL_CAPSULE | Freq: Every day | ORAL | Status: DC
Start: 2021-11-28 — End: 2021-11-29
  Administered 2021-11-29: 24000 [IU] via ORAL
  Filled 2021-11-28 (×2): qty 2

## 2021-11-28 MED ORDER — INSULIN ASPART 100 UNIT/ML IJ SOLN: 0.0000 [IU] | Freq: Three times a day (TID) | INTRAMUSCULAR | Status: DC

## 2021-11-28 MED ORDER — INSULIN ASPART 100 UNIT/ML IJ SOLN
0.0000 [IU] | Freq: Three times a day (TID) | INTRAMUSCULAR | Status: DC
  Administered 2021-11-29: 1 [IU] via SUBCUTANEOUS

## 2021-11-28 MED ORDER — ATORVASTATIN CALCIUM 40 MG PO TABS
40.0000 mg | ORAL_TABLET | Freq: Every day | ORAL | Status: DC
  Administered 2021-11-29: 40 mg via ORAL
  Filled 2021-11-28: qty 1

## 2021-11-28 MED ORDER — ACETAMINOPHEN 325 MG PO TABS
650.0000 mg | ORAL_TABLET | Freq: Four times a day (QID) | ORAL | Status: DC | PRN
Start: 2021-11-28 — End: 2021-11-29

## 2021-11-28 NOTE — Progress Notes (Addendum)
STROKE TEAM PROGRESS NOTE   INTERVAL HISTORY Patient is seen in her room with no family at the bedside.  She states that she has been experiencing disequilibrium which can interfere with walking for about two months.  It acutely worsened over the weekend, and she presented to urgent care and was instructed to come to the ED.  MRI revealed punctate ischemic infarcts in the left hemisphere which do not explain her disequilibrium.  CTA of head and neck revealed severe stenosis with radiographic string sign in the left ICA.  Vascular surgery will be consulted to address this.  Vitals:   11/28/21 0745 11/28/21 0750 11/28/21 0800 11/28/21 1100  BP: (!) 126/51  (!) 157/60 (!) 149/68  Pulse: (!) 52  (!) 54 (!) 57  Resp: '14  12 14  '$ Temp:  (!) 97.3 F (36.3 C)    TempSrc:  Oral    SpO2: 95%  98% 99%  Weight:      Height:       CBC:  Recent Labs  Lab 11/27/21 2121  WBC 8.0  NEUTROABS 4.5  HGB 9.9*  HCT 29.4*  MCV 90.7  PLT 376   Basic Metabolic Panel:  Recent Labs  Lab 11/27/21 2121  NA 130*  K 3.3*  CL 108  CO2 16*  GLUCOSE 149*  BUN 16  CREATININE 1.31*  CALCIUM 8.2*   Lipid Panel:  Recent Labs  Lab 11/28/21 0646  CHOL 85  TRIG 68  HDL 31*  CHOLHDL 2.7  VLDL 14  LDLCALC 40   HgbA1c:  Recent Labs  Lab 11/28/21 0646  HGBA1C 7.8*   Urine Drug Screen:  Recent Labs  Lab 11/28/21 0855  LABOPIA NONE DETECTED  COCAINSCRNUR NONE DETECTED  LABBENZ NONE DETECTED  AMPHETMU NONE DETECTED  THCU NONE DETECTED  LABBARB NONE DETECTED    Alcohol Level No results for input(s): ETH in the last 168 hours.  IMAGING past 24 hours CT ANGIO HEAD NECK W WO CM  Result Date: 11/28/2021 CLINICAL DATA:  61 year old female with neurologic deficit. Brain MRI this morning reveals small acute and subacute cortical and white matter infarcts in the posterior left MCA territory. EXAM: CT ANGIOGRAPHY HEAD AND NECK TECHNIQUE: Multidetector CT imaging of the head and neck was performed using  the standard protocol during bolus administration of intravenous contrast. Multiplanar CT image reconstructions and MIPs were obtained to evaluate the vascular anatomy. Carotid stenosis measurements (when applicable) are obtained utilizing NASCET criteria, using the distal internal carotid diameter as the denominator. RADIATION DOSE REDUCTION: This exam was performed according to the departmental dose-optimization program which includes automated exposure control, adjustment of the mA and/or kV according to patient size and/or use of iterative reconstruction technique. CONTRAST:  69m OMNIPAQUE IOHEXOL 350 MG/ML SOLN COMPARISON:  Brain MRI 0434 hours today. FINDINGS: CTA NECK Skeleton: Left TMJ degeneration. Cervical spine degeneration including fairly advanced facet arthropathy on the right C4-C5. No acute osseous abnormality identified. Upper chest: Mild paraseptal emphysema or scarring in the left upper lung. No superior mediastinal lymphadenopathy. Other neck: No acute finding. Aortic arch: Calcified aortic atherosclerosis. Extensive great vessel origin calcified atherosclerosis. Three vessel arch configuration. Right carotid system: Brachiocephalic artery plaque without stenosis. Mildly tortuous right CCA. Moderate calcified plaque at the right ICA origin and bulb but less than 50 % stenosis with respect to the distal vessel. Left carotid system: Calcified left CCA origin plaque without stenosis. Tortuous proximal left CCA with combined soft and calcified plaque in the vessel at the  level of the larynx, less than 50 % stenosis with respect to the distal vessel. Pronounced low-density soft plaque there series 5, image 120. Bulky calcified plaque at the left ICA origin and bulb with high-grade stenosis essentially a radiographic string sign at the origin (series 9, image 77 and series 7, image 206. But the vessel remains patent. Additional bulb calcified plaque without stenosis. Vertebral arteries: Proximal right  subclavian artery calcified plaque with only mild stenosis. Mild soft and calcified plaque at the right vertebral artery origin with only mild stenosis. Dominant right vertebral artery otherwise normal to the skull base. Moderate proximal left subclavian artery plaque with 50% stenosis. Non dominant left vertebral artery with mild to moderate soft plaque stenosis at its origin on series 8, image 154. Left vertebral remains diminutive throughout the neck but patent to the skull base. CTA HEAD Posterior circulation: Right V4 segment is dominant and primarily supplies the basilar. Right V4 calcified plaque but only mild distal right vertebral stenosis. Diminutive left vertebral artery terminates in PICA. Patent right PICA origin. Patent basilar artery with mild tortuosity. Patent SCA and right PCA origins. Fetal type left PCA origin. Right posterior communicating artery diminutive or absent. Bilateral PCA branches are within normal limits. Anterior circulation: Both ICA siphons are patent but heavily calcified. On the left there is moderate to severe stenosis at the left anterior genu on series 7, image 112. Normal left posterior communicating artery origin. On the right there is moderate stenosis throughout the cavernous segment, mild supraclinoid stenosis. Patent carotid termini, MCA and ACA origins. Left A1 segment appears mildly dominant. Normal anterior communicating artery and bilateral ACA branches. Left MCA M1 segment and bifurcation are patent without stenosis. Right MCA M1 segment and bifurcation are patent without stenosis. Bilateral MCA branches are within normal limits. Venous sinuses: Patent. Anatomic variants: Dominant right vertebral artery. The left is diminutive and terminates in PICA. Fetal type left PCA origin. Review of the MIP images confirms the above findings IMPRESSION: 1. Negative for large vessel occlusion. 2. Positive for advanced atherosclerosis in the neck and at the skull base: - soft and  calcified Left Carotid atherosclerosis in the neck with bulky plaque at the Left ICA origin and high-grade RADIOGRAPHIC STRING SIGN stenosis. - heavily calcified ICA siphons. Moderate to Severe Left anterior genu and moderate Right cavernous ICA siphon stenoses. - Only mild vertebral artery stenosis. 3. Aortic Atherosclerosis (ICD10-I70.0). Electronically Signed   By: Genevie Ann M.D.   On: 11/28/2021 07:32   CT Head Wo Contrast  Result Date: 11/27/2021 CLINICAL DATA:  Stroke-like symptoms EXAM: CT HEAD WITHOUT CONTRAST TECHNIQUE: Contiguous axial images were obtained from the base of the skull through the vertex without intravenous contrast. RADIATION DOSE REDUCTION: This exam was performed according to the departmental dose-optimization program which includes automated exposure control, adjustment of the mA and/or kV according to patient size and/or use of iterative reconstruction technique. COMPARISON:  06/02/2021 FINDINGS: Brain: No evidence of acute infarction, hemorrhage, hydrocephalus, extra-axial collection or mass lesion/mass effect. Vascular: No hyperdense vessel or unexpected calcification. Skull: Normal. Negative for fracture or focal lesion. Sinuses/Orbits: No acute finding. Other: None. IMPRESSION: No acute intracranial abnormality noted. Electronically Signed   By: Inez Catalina M.D.   On: 11/27/2021 21:33   MR Brain W and Wo Contrast  Result Date: 11/28/2021 CLINICAL DATA:  61 year old female with stroke-like symptoms. Loss of equilibrium. History of stroke. EXAM: MRI HEAD WITHOUT AND WITH CONTRAST TECHNIQUE: Multiplanar, multiecho pulse sequences of the brain and  surrounding structures were obtained without and with intravenous contrast. CONTRAST:  92m GADAVIST GADOBUTROL 1 MMOL/ML IV SOLN COMPARISON:  Brain MRI 12/09/2015.  Head CT 11/27/2021. FINDINGS: Brain: Normal cerebral volume. Small cortically based focus of restricted diffusion in the left anterior motor strip superiorly (series 2, image  46) near the right upper extremity representation area is accompanied by mild T2 and FLAIR hyperintensity (series 6, image 33). No abnormal enhancement. More subtle and possibly acute or subacute nearby posterior left frontal white matter abnormal diffusion (series 2, image 42 and series 3 image 19) in the posterior left corona radiata. Some associated T2 and FLAIR hyperintensity. No other restricted diffusion. No midline shift, mass effect, evidence of mass lesion, ventriculomegaly, extra-axial collection or acute intracranial hemorrhage. Cervicomedullary junction and pituitary are within normal limits. Chronic heterogeneous T2 and FLAIR hyperintensity throughout the pons (series 5, image 10) appears mildly progressed since 2017. No superimposed cortical encephalomalacia or chronic cerebral blood products. Outside of the abnormal diffusion there are scattered small T2 and FLAIR hyperintense areas in the bilateral cerebral white matter which have increased since 2017, mild to moderate for age. Cerebellum and deep gray nuclei appear to remain normal. There is a punctate focus of abnormal enhancement in the anterior left frontal lobe on series 10, image 31 and series 11, image 28. No associated signal abnormality here on the remaining sequences. This is new since 2017. No other abnormal enhancement identified. No dural thickening Vascular: Major intracranial vascular flow voids are stable since 2017. The distal right vertebral artery appears dominant and the left diminutive possibly terminating in PICA. Pneumatized left anterior clinoid process redemonstrated. The major dural venous sinuses are enhancing and appear to be patent. Skull and upper cervical spine: Stable and negative. Visualized bone marrow signal is within normal limits. Sinuses/Orbits: Stable and negative. Other: Mild right mastoid effusion is chronic, mildly increased since 2017. Left mastoids remain clear. Other Visible internal auditory structures  appear normal. Negative visible scalp and face. IMPRESSION: 1. Small acute cortically based infarct in the left anterior motor strip near the right upper extremity representation area. And nearby more subacute appearing white matter small vessel ischemia in the posterior left corona radiata. No associated hemorrhage or mass effect. 2. Positive also for indeterminate punctate abnormal enhancement in the anterior left frontal lobe (series 10, image 31), new since 2017. In light of #1, perhaps this is enhancement of a late subacute small vessel infarct. But a repeat Brain MRI without and with contrast is recommended in 3 months to re-evaluate. 3. Chronically advanced signal changes in the pons, nonspecific but favor small vessel disease related in this setting. Mild to moderate for age cerebral white matter signal changes have also progressed since 2017. Electronically Signed   By: HGenevie AnnM.D.   On: 11/28/2021 05:42    PHYSICAL EXAM General:  Alert, well-nourished, well-developed elderly patient in no acute distress Respiratory:  Regular, unlabored respirations on room air  NEURO:  Mental Status: AA&Ox3  Speech/Language: speech is without dysarthria or aphasia.  Repetition, fluency, and comprehension intact.  Cranial Nerves:  II: PERRL. Visual fields full.  III, IV, VI: EOMI. Eyelids elevate symmetrically.  V: Sensation is intact to light touch but slightly diminished on the left   VII: Smile is symmetrical.  VIII: hearing intact to voice. IX, X: Phonation is normal.  XII: tongue is midline without fasciculations. Motor: 5/5 strength to all muscle groups tested.  Tone: is normal and bulk is normal Sensation- Intact to light  touch bilaterally but slightly diminished in left leg Coordination: FTN intact bilaterally.  No drift.  Gait- deferred   ASSESSMENT/PLAN [name] is a 61 y.o. female with history of Meniere's disease, stroke, DM, HTN, neuropathy and PVD presenting with   disequilibrium which interferes with walking and has lasted about two months.  It acutely worsened over the weekend, and she presented to urgent care and was instructed to come to the ED.  MRI revealed punctate ischemic infarcts in the left hemisphere which do not explain her disequilibrium.  CTA of head and neck revealed severe stenosis with radiographic string sign in the left ICA.  Vascular surgery will be consulted to address this.  Stroke:  left frontal punctate infarcts  likely secondary to severe left ICA bulb stenosis CT head No acute abnormality.  CTA head & neck severe left carotid atherosclerosis with radiographic string sign, heavily calcified bilateral ICA siphons, mild vertebral artery stenosis Cerebral angio pending MRI  two small infarcts in left cerebral hemisphere, in left anterior motor strip and anterior left frontal lobe, small vessel disease Carotid Doppler  pending 2D Echo EF 43-32%, grade 1 diastolic dysfunction, no atrial level shunt LDL 40 HgbA1c 7.8 VTE prophylaxis - lovenox aspirin 81 mg daily prior to admission, now on aspirin 325 mg daily and clopidogrel 75 mg daily.  Therapy recommendations:  outpatient PT Disposition:  pending, likely home  Severe left ICA stenosis CTA head and neck reveals radiographic string sign in left ICA bulb Carotid ultrasound pending Consult vascular surgery  Hypertension Home meds:  losartan 50 mg daily, norvasc 5 mg qAM and 2.5 mg qHS,  Stable Avoid low BP Long-term BP goal normotensive  Hyperlipidemia Home meds:  atorvastatin 40 mg daily LDL 40, goal < 70 Resume home lipitor 40 Continue statin at discharge  Diabetes type II Uncontrolled Home meds:  Tresiba 16 units daily, insulin aspart 4 units TID after meals HgbA1c 7.8, goal < 7.0 CBGs Diabetes coordinator consult [facility] PCP follow-up for better DM control  Tobacco abuse Current smoker Smoking cessation counseling provided Nicotine patch provided Pt is  willing to quit  Other Stroke Risk Factors Hx of acute hearing loss in 04/2016 Chronic gait imbalance    Other Active Problems UA WBC > 50, on rocephin  Hospital day # [place] , MSN, AGACNP-BC [facility] See Amion for schedule and pager information 11/28/2021 12:09 PM  ATTENDING NOTE: I reviewed above note and agree with the assessment and plan. Pt was seen and examined.   61 year old female with history of diabetes, hypertension, PVD, smoker, acute right hearing loss in 04/2016 admitted for acute worsening of chronic gait imbalance.  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  Pending further evaluation by [facility] neurosurgery.  Over the weekend, she was did not land attending her nephew's graduation party, had acute worsening of gait imbalance.  CT no acute abnormality.  CT head and neck left ICA bulb with string sign with heavy soft and calcified plaque.  Bilateral ICA siphon atherosclerosis left more than right.  MRI showed 2-3 punctate left frontal infarcts, MCA territory.  EF 60 to 65%.  Carotid Doppler pending.  LDL 40, A1c 7.8.  Creatinine 1.31.  Hemoglobin 9.9.  UA WBC more than 50.  On exam, patient sitting in chair, daughter at bedside.  Neurologically intact except right hearing impaired.  Etiology for patient left MCA punctate strokes likely due to left ICA high-grade stenosis.  Vascular surgery consulted for further evaluation.  Currently on aspirin 325 and Plavix 75 and Lipitor 40.  Continue nicotine patch, smoking cessation education provided.  Also on Rocephin for UTI.  We will follow.  For detailed assessment and plan, please refer to above as I have made changes wherever appropriate.   Michelle Hawking, MD PhD Stroke Neurology 11/28/2021 2:59 PM      To contact Stroke Continuity  provider, please refer to [email or web address] After hours, contact General Neurology

## 2021-11-28 NOTE — Consult Note (Addendum)
[facility] NOTE   MRN : [id number]  Reason for Consult: left ICA high grade stenosis Referring Physician:   History of Present Illness: 61 y/o female presented to the ED with progressive worsening balance.  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    MRI of the brain with and without contrast shows small acute cortically based infarct.  We have been asked to consult for left ICA stenosis in the setting of a new ischemic brain event.  She denies weakness in her extremities, amaurosis or aphasia.     Past medical history includes: diabetes, hypertension, neuropathy,      Current Facility-Administered Medications  Medication Dose Route Frequency Provider Last Rate Last Admin    stroke: early stages of recovery book   Does not apply Once Amie Portland, MD       0.9 %  sodium chloride infusion   Intravenous Continuous Norval Morton, MD   Stopped at 11/28/21 1012   acetaminophen (TYLENOL) tablet 650 mg  650 mg Oral Q6H PRN Howerter, Justin B, DO       Or   acetaminophen (TYLENOL) suppository 650 mg  650 mg Rectal Q6H PRN Howerter, Justin B, DO       aspirin tablet 325 mg  325 mg Oral Daily Amie Portland, MD   325 mg at 11/28/21 1013   atorvastatin (LIPITOR) tablet 80 mg  80 mg Oral Daily Smith, Rondell A, MD   80 mg at 11/28/21 1013   cefTRIAXone (ROCEPHIN) 1 g in sodium chloride 0.9 % 100 mL IVPB  1 g Intravenous Q24H Fuller Plan A, MD   Stopped at 11/28/21 0929   clopidogrel (PLAVIX) tablet 75 mg  75 mg Oral Daily Amie Portland, MD   75 mg at 11/28/21 1012   enoxaparin (LOVENOX) injection 40 mg  40 mg Subcutaneous Q24H Smith, Rondell A, MD   40 mg at 11/28/21 1015   insulin aspart (novoLOG) injection 0-15 Units  0-15 Units Subcutaneous TID WC Smith, Rondell A, MD       insulin aspart (novoLOG) injection 0-5 Units  0-5 Units Subcutaneous QHS Smith, Rondell A, MD       nicotine (NICODERM CQ - dosed in mg/24  hours) patch 21 mg  21 mg Transdermal Daily Amie Portland, MD   21 mg at 11/28/21 1015   senna-docusate (Senokot-S) tablet 1 tablet  1 tablet Oral QHS PRN Norval Morton, MD       Current Outpatient Medications  Medication Sig Dispense Refill   acetaminophen (TYLENOL) 500 MG tablet Take 1,000 mg by mouth daily as needed for headache (pain).     amLODipine (NORVASC) 2.5 MG tablet Take 2.5 mg by mouth at bedtime.     amLODipine (NORVASC) 5 MG tablet Take 5 mg by mouth every morning.     aspirin 81 MG EC tablet Take 81 mg by mouth every morning.     atorvastatin (LIPITOR) 40 MG tablet Take 40 mg by mouth every morning.     buPROPion (WELLBUTRIN XL) 150 MG 24 hr tablet Take 150 mg by mouth every morning.     Calcium-Magnesium-Zinc (CAL-MAG-ZINC PO) Take 1 tablet by mouth every morning.     ciprofloxacin (CIPRO) 500 MG tablet Take 500 mg by mouth 2 (two) times daily.     Cyanocobalamin (VITAMIN B-12 PO) Take 1 tablet by mouth every morning.     estradiol (ESTRACE) 0.1 MG/GM vaginal cream  Place 1 Applicatorful vaginally at bedtime.     ferrous sulfate 325 (65 FE) MG tablet Take 1 tablet (325 mg total) by mouth 2 (two) times daily with a meal. (Patient taking differently: Take 325 mg by mouth every morning.) 60 tablet 0   fluorouracil (EFUDEX) 5 % cream Apply 1 application. topically daily as needed (skin cancer breakouts).     fluticasone-salmeterol (ADVAIR) 100-50 MCG/ACT AEPB Inhale 1 puff into the lungs daily as needed (shortness of breath).     folic acid (FOLVITE) 1 MG tablet Take 1 mg by mouth every morning.     insulin aspart (NOVOLOG) 100 UNIT/ML FlexPen Inject 4 Units into the skin See admin instructions. Inject 4 units subcutaneously three times daily after meals - plus sliding scale adjustment - add 1 unit for every 50 units over 200     Insulin Degludec (TRESIBA FLEXTOUCH) 100 UNIT/ML SOPN Inject 7 Units into the skin daily. (Patient taking differently: Inject 16 Units into the skin daily  after breakfast.)     ipratropium-albuterol (DUONEB) 0.5-2.5 (3) MG/3ML SOLN Take 3 mLs by nebulization every 6 (six) hours as needed for up to 15 days. 180 mL 0   latanoprost (XALATAN) 0.005 % ophthalmic solution Place 1 drop into both eyes at bedtime.     levothyroxine (SYNTHROID) 50 MCG tablet Take 50 mcg by mouth daily before breakfast.     losartan (COZAAR) 50 MG tablet Take 50 mg by mouth every morning.     methazolamide (NEPTAZANE) 50 MG tablet Take 50 mg by mouth 2 (two) times daily.     Multiple Vitamin (MULTIVITAMIN WITH MINERALS) TABS tablet Take 1 tablet by mouth every morning.     oxybutynin (DITROPAN-XL) 10 MG 24 hr tablet Take 10 mg by mouth every morning.     Pancrelipase, Lip-Prot-Amyl, 24000-76000 units CPEP Take 2 capsules by mouth daily after breakfast.     pregabalin (LYRICA) 300 MG capsule Take 300 mg by mouth 2 (two) times daily.     sertraline (ZOLOFT) 100 MG tablet Take 100 mg by mouth every morning.     sodium chloride 1 g tablet Take 1 g by mouth 3 (three) times daily as needed (brain fog from sodium insufficiency).     Vitamin D, Ergocalciferol, (DRISDOL) 1.25 MG (50000 UNIT) CAPS capsule Take 50,000 Units by mouth every Sunday.     VITAMIN E PO Take 1 capsule by mouth every morning.     sulfamethoxazole-trimethoprim (BACTRIM DS) 800-160 MG tablet Take 1 tablet by mouth 2 (two) times daily. (Patient not taking: Reported on 11/28/2021)     trimethoprim (TRIMPEX) 100 MG tablet Take 100 mg by mouth every morning. (Patient not taking: Reported on 11/28/2021)      Pt meds include: Statin :yes Betablocker: No ASA: Yes Other anticoagulants/antiplatelets: Plavix  Past Medical History:  Diagnosis Date   Diabetes mellitus    Hypertension    Neuropathy    Pancreas disorder    Peripheral vascular disease ([place])    Stroke [facility])     Past Surgical History:  Procedure Laterality Date   CARDIAC CATHETERIZATION      Social History Social History   Tobacco Use    Smoking status: Some Days    Types: Cigarettes   Smokeless tobacco: Never  Vaping Use   Vaping Use: Never used  Substance Use Topics   Alcohol use: No   Drug use: No    Family History Family History  Problem Relation Age of Onset  Cancer Mother    Hypertension Father    Diabetes Father    Cancer Sister    Diabetes Sister    Diabetes Brother    Kidney failure Brother    CAD Brother    Diabetes Other     Allergies  Allergen Reactions   Dorzolamide Hcl-Timolol Mal Itching and Other (See Comments)    Makes her eyes burn Conjunctival Injection with Burning Visual Acuity Blurry    Brimonidine Rash and Other (See Comments)    Follicular conjunctivitis   Netarsudil-Latanoprost Other (See Comments)    Redness, tearing, burning     REVIEW OF SYSTEMS  General: '[ ]'$  Weight loss, '[ ]'$  Fever, '[ ]'$  chills Neurologic: '[ ]'$  Dizziness, '[ ]'$  Blackouts, '[ ]'$  Seizure '[ ]'$  Stroke, '[ ]'$  "Mini stroke", '[ ]'$  Slurred speech, '[ ]'$  Temporary blindness; '[ ]'$  weakness in arms or legs, '[ ]'$  Hoarseness '[ ]'$  Dysphagia Cardiac: '[ ]'$  Chest pain/pressure, '[ ]'$  Shortness of breath at rest '[ ]'$  Shortness of breath with exertion, '[ ]'$  Atrial fibrillation or irregular heartbeat  Vascular: '[ ]'$  Pain in legs with walking, '[ ]'$  Pain in legs at rest, '[ ]'$  Pain in legs at night,  '[ ]'$  Non-healing ulcer, '[ ]'$  Blood clot in vein/DVT,   Pulmonary: '[ ]'$  Home oxygen, '[ ]'$  Productive cough, '[ ]'$  Coughing up blood, '[ ]'$  Asthma,  '[ ]'$  Wheezing '[ ]'$  COPD Musculoskeletal:  '[ ]'$  Arthritis, '[ ]'$  Low back pain, '[ ]'$  Joint pain Hematologic: '[ ]'$  Easy Bruising, '[ ]'$  Anemia; '[ ]'$  Hepatitis Gastrointestinal: '[ ]'$  Blood in stool, '[ ]'$  Gastroesophageal Reflux/heartburn, Urinary: '[ ]'$  chronic Kidney disease, '[ ]'$  on HD - '[ ]'$  MWF or '[ ]'$  TTHS, '[ ]'$  Burning with urination, '[ ]'$  Difficulty urinating Skin: '[ ]'$  Rashes, '[ ]'$  Wounds Psychological: '[ ]'$  Anxiety, '[ ]'$  Depression  Physical Examination Vitals:   11/28/21 0715 11/28/21 0745 11/28/21 0750 11/28/21 0800  BP: (!)  164/57 (!) 126/51  (!) 157/60  Pulse: (!) 55 (!) 52  (!) 54  Resp: '17 14  12  '$ Temp:   (!) 97.3 F (36.3 C)   TempSrc:   Oral   SpO2: 98% 95%  98%  Weight:      Height:       Body mass index is 22.15 kg/m.  General:  WDWN in NAD Gait: Normal HENT: WNL Eyes: Pupils equal Pulmonary: normal non-labored breathing , without Rales, rhonchi,  wheezing Cardiac: RRR, without  Murmurs, rubs or gallops; No carotid bruits Abdomen: soft, NT, no masses Skin: no rashes, ulcers noted;  no Gangrene , no cellulitis; no open wounds;   Vascular Exam/Pulses:palpable radial pulse B   Musculoskeletal: no muscle wasting or atrophy; no edema  Neurologic: A&O X 3; Appropriate Affect ;  SENSATION: normal; MOTOR FUNCTION: 5/5 Symmetric Speech is fluent/normal   Significant Diagnostic Studies: CBC Lab Results  Component Value Date   WBC 8.0 11/27/2021   HGB 9.9 (L) 11/27/2021   HCT 29.4 (L) 11/27/2021   MCV 90.7 11/27/2021   PLT 223 11/27/2021    BMET    Component Value Date/Time   NA 130 (L) 11/27/2021 2121   K 3.3 (L) 11/27/2021 2121   CL 108 11/27/2021 2121   CO2 16 (L) 11/27/2021 2121   GLUCOSE 149 (H) 11/27/2021 2121   BUN 16 11/27/2021 2121   CREATININE 1.31 (H) 11/27/2021 2121   CALCIUM 8.2 (L) 11/27/2021 2121   GFRNONAA 46 (L) 11/27/2021 2121  GFRAA >60 04/02/2019 2338   Estimated Creatinine Clearance: 46.5 mL/min (A) (by C-G formula based on SCr of 1.31 mg/dL (H)).  COAG Lab Results  Component Value Date   INR 1.2 04/21/2021     Non-Invasive Vascular Imaging:  MRI   IMPRESSION: 1. Small acute cortically based infarct in the left anterior motor strip near the right upper extremity representation area. And nearby more subacute appearing white matter small vessel ischemia in the posterior left corona radiata. No associated hemorrhage or mass effect.   2. Positive also for indeterminate punctate abnormal enhancement in the anterior left frontal lobe (series 10,  image 31), new since 2017. In light of #1, perhaps this is enhancement of a late subacute small vessel infarct. But a repeat Brain MRI without and with contrast is recommended in 3 months to re-evaluate.   3. Chronically advanced signal changes in the pons, nonspecific but favor small vessel disease related in this setting. Mild to moderate for age cerebral white matter signal changes have also progressed since 2017.  CTA neck IMPRESSION: 1. Negative for large vessel occlusion.   2. Positive for advanced atherosclerosis in the neck and at the skull base: - soft and calcified Left Carotid atherosclerosis in the neck with bulky plaque at the Left ICA origin and high-grade RADIOGRAPHIC STRING SIGN stenosis. - heavily calcified ICA siphons. Moderate to Severe Left anterior genu and moderate Right cavernous ICA siphon stenoses. - Only mild vertebral artery stenosis.   3. Aortic Atherosclerosis (ICD10-I70.0).    ASSESSMENT/PLAN:  Left ICA sever high grade stenosis"string sign" on CTA in the setting of stroke symptoms to include: worsening balance with history of CVA event in 2017.     She will require intervention of the left ICA open endarterectomy verses TCAR.  Dr. Virl Cagey will review and examine the patient to determine the best course of intervention.     Roxy Horseman 11/28/2021 10:32 AM   Addendum:   In short, Michelle Nicholson is a 61 year old female with longstanding history of smoking with waxing and waning episodes of dizziness over the last several years who has had previous diagnoses of Mnire's disease as well as vertigo.  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  Imaging demonstrates greater than 80% stenosis of the left internal carotid artery.  I had a long discussion with Michelle Nicholson and her daughter regarding symptomatic carotid artery stenosis and notably the reduction in stroke rate with  revascularization from 26% to 9% over the course of 2 years. We discussed both transcarotid artery revascularization as well as carotid endarterectomy.  We discussed the risks and benefits of each.  I asked Michelle Nicholson to consider her options overnight, and that we would have further discussion in the morning.  Michelle Nicholson is aware, that treatment is to reduce the risk of future stroke, and would likely not affect/improve current symptoms.  She is still has some residual dizziness.  Michelle Nicholson is followed by Dr. Nyoka Cowden in [facility] who has performed balloon angioplasty to bilateral lower extremities.  I offered to relay this information to him should she choose to pursue carotid revascularization in [facility].  Please continue current medical therapy being dual antiplatelet, high intensity statin We discussed smoking cessation  Would appreciate cardiology consult for preop risk assessment as pt had notable labored breathing with conversation and stated she would have difficulty climbing a flight of stairs with out significant SOB.   I  will see her in the morning to finalize surgical plans versus follow-up.   Broadus John

## 2021-11-28 NOTE — ED Notes (Signed)
Pt returned from MRI °

## 2021-11-28 NOTE — Consult Note (Signed)
Neurology Consultation  Reason for Consult: Stroke Referring Physician: Dr. Waverly Ferrari  CC: Dizziness, loss of balance  History is obtained from: Chart, patient  HPI: Michelle Nicholson is a 61 y.o. female past medical history of a prior stroke according to the patient in 2017 but chart review lists Mnire's at that time-with residual disequilibrium and right ear hearing loss, diabetes, hypertension, neuropathy, peripheral vascular disease, on aspirin and statin presenting for evaluation of feeling dizzy and off balance for a few months but acute worsening this past weekend when she was at a graduation and [place]. She reports that she had been having progressively worse balance and equilibrium over the past few months.  She was attending a family member's graduation this weekend and her equilibrium felt off to the extent that she had ask another family member to help her maintain her balance while walking.  She has an international trip planned in the symptoms since this past week and did not improve so she came in for further evaluation.  She was seen by urgent care, sent to one of the freestanding ERs where she was evaluated.  Concern for stroke for which she was sent for [facility] further imaging.  MRI of the brain reveals punctate ischemic infarcts in the left hemisphere.  Neurological consultation obtained. Reports continuing tobacco abuse. Reports compliance with aspirin and statin.   LKW: 4 days ago tpa given?: no, outside the window Premorbid modified Rankin scale (mRS): 1  ROS: Full ROS was performed and is negative except as noted in the HPI  Past Medical History:  Diagnosis Date   Diabetes mellitus    Hypertension    Neuropathy    Pancreas disorder    Peripheral vascular disease ([place])    Stroke ([place])     Family History  Problem Relation Age of Onset   Cancer Mother    Hypertension Father    Diabetes Father    Cancer Sister    Diabetes Sister    Diabetes Brother     Kidney failure Brother    CAD Brother    Diabetes Other      Social History:   reports that she has been smoking cigarettes. She has never used smokeless tobacco. She reports that she does not drink alcohol and does not use drugs.  Medications  Current Facility-Administered Medications:     stroke: early stages of recovery book, , Does not apply, Once, Howerter, Justin B, DO   acetaminophen (TYLENOL) tablet 650 mg, 650 mg, Oral, Q6H PRN **OR** acetaminophen (TYLENOL) suppository 650 mg, 650 mg, Rectal, Q6H PRN, Howerter, Justin B, DO  Current Outpatient Medications:    acetaminophen (TYLENOL) 500 MG tablet, Take 1,000 mg by mouth every 6 (six) hours as needed for headache (pain)., Disp: , Rfl:    albuterol (VENTOLIN HFA) 108 (90 Base) MCG/ACT inhaler, Inhale 1 puff into the lungs every 6 (six) hours as needed for wheezing or shortness of breath., Disp: , Rfl:    amLODipine (NORVASC) 2.5 MG tablet, Take 2.5 mg by mouth at bedtime., Disp: , Rfl:    amLODipine (NORVASC) 5 MG tablet, Take 5 mg by mouth every morning., Disp: , Rfl:    aspirin 81 MG EC tablet, Take 81 mg by mouth every morning., Disp: , Rfl:    atorvastatin (LIPITOR) 40 MG tablet, Take 40 mg by mouth every morning., Disp: , Rfl:    buPROPion (WELLBUTRIN XL) 150 MG 24 hr tablet, Take 150 mg by mouth every morning., Disp: ,  Rfl:    Calcium-Magnesium-Zinc (CAL-MAG-ZINC PO), Take 1 tablet by mouth every morning., Disp: , Rfl:    clopidogrel (PLAVIX) 75 MG tablet, Take 75 mg by mouth every morning., Disp: , Rfl:    ferrous sulfate 325 (65 FE) MG tablet, Take 1 tablet (325 mg total) by mouth 2 (two) times daily with a meal., Disp: 60 tablet, Rfl: 0   fluconazole (DIFLUCAN) 150 MG tablet, Take 150 mg by mouth See admin instructions. Take one tablet (150 mg) once as needed for yeast infection from antibiotic, may repeat in 3 days if still needed. (Patient not taking: No sig reported), Disp: , Rfl:    insulin aspart (NOVOLOG) 100 UNIT/ML  FlexPen, Inject 4 Units into the skin See admin instructions. Inject 4 units subcutaneously three times daily after meals - plus sliding scale adjustment as directed up to 30 units daily, Disp: , Rfl:    Insulin Degludec (TRESIBA FLEXTOUCH) 100 UNIT/ML SOPN, Inject 7 Units into the skin daily. (Patient taking differently: Inject 16 Units into the skin daily after breakfast.), Disp: , Rfl:    ipratropium-albuterol (DUONEB) 0.5-2.5 (3) MG/3ML SOLN, Take 3 mLs by nebulization every 6 (six) hours as needed for up to 15 days., Disp: 180 mL, Rfl: 0   latanoprost (XALATAN) 0.005 % ophthalmic solution, Place 1 drop into both eyes at bedtime., Disp: , Rfl:    levothyroxine (SYNTHROID) 50 MCG tablet, Take 50 mcg by mouth daily before breakfast., Disp: , Rfl:    losartan (COZAAR) 50 MG tablet, Take 50 mg by mouth every morning., Disp: , Rfl:    methazolamide (NEPTAZANE) 50 MG tablet, Take 50 mg by mouth 2 (two) times daily., Disp: , Rfl:    Multiple Vitamin (MULTIVITAMIN WITH MINERALS) TABS tablet, Take 1 tablet by mouth every morning., Disp: , Rfl:    Omega-3 Fatty Acids (FISH OIL) 1000 MG CAPS, Take 2,000 mg by mouth every morning., Disp: , Rfl:    Pancrelipase, Lip-Prot-Amyl, 24000-76000 units CPEP, Take 2 capsules by mouth daily after breakfast., Disp: , Rfl:    prednisoLONE acetate (PRED FORTE) 1 % ophthalmic suspension, Place 1 drop into the right eye 4 (four) times daily., Disp: , Rfl:    pregabalin (LYRICA) 300 MG capsule, Take 300 mg by mouth at bedtime., Disp: , Rfl:    sertraline (ZOLOFT) 100 MG tablet, Take 100 mg by mouth every morning., Disp: , Rfl:    sodium chloride 1 g tablet, Take 1 g by mouth 3 (three) times daily as needed (brain fog from sodium insufficiency)., Disp: , Rfl:    Vitamin D, Ergocalciferol, (DRISDOL) 1.25 MG (50000 UNIT) CAPS capsule, Take 50,000 Units by mouth every Sunday., Disp: , Rfl:    Exam: Current vital signs: BP (!) 171/66   Pulse (!) 56   Temp (!) 97.4 F (36.3  C) (Oral)   Resp 13   Ht '5\' 9"'$  (1.753 m)   Wt 68 kg   SpO2 99%   BMI 22.15 kg/m  Vital signs in last 24 hours: Temp:  [97.4 F (36.3 C)-99 F (37.2 C)] 97.4 F (36.3 C) (05/18 0330) Pulse Rate:  [52-72] 56 (05/18 0600) Resp:  [13-21] 13 (05/18 0600) BP: (111-171)/(50-67) 171/66 (05/18 0600) SpO2:  [94 %-100 %] 99 % (05/18 0600) Weight:  [68 kg] 68 kg (05/17 2106) GENERAL: Awake, alert in NAD HEENT: - Normocephalic and atraumatic, dry mm, no LN++, no Thyromegally LUNGS - Clear to auscultation bilaterally with no wheezes CV - S1S2 RRR, no m/r/g,  equal pulses bilaterally. ABDOMEN - Soft, nontender, nondistended with normoactive BS Ext: warm, well perfused, intact peripheral pulses, no edema  NEURO:  Mental Status: AA&Ox3  Language: speech is nondysarthric.  Naming, repetition, fluency, and comprehension intact. Cranial Nerves: Pupils equal round reactive,/brisk. EOMI, visual fields full, no facial asymmetry, facial sensation intact, hearing extremely diminished in the right ear, tongue/uvula/soft palate midline, normal sternocleidomastoid and trapezius muscle strength. No evidence of tongue atrophy or fibrillations Motor: 5/5 with no drift Tone: is normal and bulk is normal Sensation- Intact to light touch bilaterally Coordination: FTN intact bilaterally Gait- deferred  NIHSS-0   Labs I have reviewed labs in epic and the results pertinent to this consultation are:   CBC    Component Value Date/Time   WBC 8.0 11/27/2021 2121   RBC 3.24 (L) 11/27/2021 2121   HGB 9.9 (L) 11/27/2021 2121   HCT 29.4 (L) 11/27/2021 2121   PLT 223 11/27/2021 2121   MCV 90.7 11/27/2021 2121   MCH 30.6 11/27/2021 2121   MCHC 33.7 11/27/2021 2121   RDW 14.0 11/27/2021 2121   LYMPHSABS 2.5 11/27/2021 2121   MONOABS 0.7 11/27/2021 2121   EOSABS 0.2 11/27/2021 2121   BASOSABS 0.1 11/27/2021 2121    CMP     Component Value Date/Time   NA 130 (L) 11/27/2021 2121   K 3.3 (L) 11/27/2021  2121   CL 108 11/27/2021 2121   CO2 16 (L) 11/27/2021 2121   GLUCOSE 149 (H) 11/27/2021 2121   BUN 16 11/27/2021 2121   CREATININE 1.31 (H) 11/27/2021 2121   CALCIUM 8.2 (L) 11/27/2021 2121   PROT 6.4 (L) 11/27/2021 2121   ALBUMIN 3.5 11/27/2021 2121   AST 18 11/27/2021 2121   ALT 12 11/27/2021 2121   ALKPHOS 84 11/27/2021 2121   BILITOT 0.5 11/27/2021 2121   GFRNONAA 46 (L) 11/27/2021 2121   GFRAA >60 04/02/2019 2338    Imaging I have reviewed the images obtained: MRI of the brain with and without contrast shows small acute cortically based infarct in the left anterior motor strip near the right upper extremity representation area along with a more subacute appearing white matter small vessel ischemic appearing stroke in the posterior left corona radiata.  No hemorrhage or mass effect.  Indeterminate punctate abnormal enhancement in the anterior left frontal lobe new since 2017 which might just be a subacute stroke.  Short-term imaging with a 93-monthMRI recommended for further evaluation.   Assessment:  61year old with multiple cerebrovascular risk factors including diabetes, hypertension, tobacco abuse, peripheral vascular disease, prior history of what patient reports a stroke but documentation lists his Mnire's disease-with residual right ear hearing loss and some baseline disequilibrium, presenting for evaluation of worsening equilibrium and balance along with dizziness for the past 4 days.   On my exam-NIH 0. MRI of the brain consistent with small punctate strokes in the left hemisphere-concomitant small vessel disease versus embolic etiology.  Will benefit from work-up  Recommendations: Admit to hospitalist Frequent neurochecks Telemetry CT angiography head and neck 2D echo A1c Lipid panel Aspirin 325+ Plavix 75 Atorvastatin 80 PT OT Speech therapy No need for permissive hypertension-last known well multiple days ago [place] given this  new acute and subacute stroke, I would advise against air travel for the next few weeks and admission for stroke risk factor work-up.  Plan discussed with Dr. PWaverly Ferrariin the ER. Plan also discussed with the patient.  Stroke team to follow  --  Amie Portland, MD Neurologist [facility] Pager: [phone]

## 2021-11-28 NOTE — Progress Notes (Signed)
Carotid duplex bilateral attempted. Patient in recliner chair, and will require transfer to bed. Will attempt again as schedule and patient availability permits.   Darlin Coco, RDMS, RVT

## 2021-11-28 NOTE — H&P (Addendum)
History and Physical    Patient: Michelle Nicholson RSW:[id number] DOB: Feb 26, 1961 DOA: 11/27/2021 DOS: the patient was seen and examined on 11/28/2021 PCP: Health, [facility] (Inactive)  Patient coming from: Geologist, engineering Complaint:  Chief Complaint  Patient presents with   equilibrium feels off   HPI: Michelle Nicholson is a 61 y.o. right-handed female with medical history significant of hypertension, diabetes mellitus type 2, peripheral vascular disease, hypothyroidism, SIADH, tobacco abuse, and recurrent UTIs presents with complaints of feeling unsteady on her feet.  Symptoms have been present for several weeks, but acutely worse in the last week.  She initially attributed symptoms to vertigo.  She noticed the change after going on a 8-hour car ride to attend family members graduation.  Patient noted that they did take frequent stops.  She reports that since that time her equilibrium has been off.  She never felt like the room was spinning around her, but needed to hold onto things to not fall.  Normally patient been able to ambulate without need of assistance.  She states feeling like she was wobbly and was leaning over, but did not notice if she was leading to a specific side.  Symptoms include feeling foggy headed, constant fatigue despite sleeping for 11 hours, and speech more slurred near the end of the day.  Patient reports that her vision has been worse in the last 6 months she previously had been on Plavix, but has suffered a retinal hemorrhage for which vascular surgery had okayed discontinuation.  She had surgery for the eye last October and is being followed by ophthalmology.  She has been taking aspirin 81 mg daily.  The patient had been recently started on antibiotics ciprofloxacin 500 mg twice daily for a urinary tract infection the other day because she was having symptoms of urinary frequency with intermittent incontinence.  She had gone to be seen at urgent care due to her  symptoms before her trip to [place] and was sent to med center thereafter.  Patient denies any reports of any recent fevers, chills, headache, nausea, vomiting, or diarrhea symptoms.  She also admits to smoking half pack of cigarettes per day on average and has done so for at least 40 years.  On admission to the emergency department patient was noted to have temperature 99 F, pulse 52-72, respirations 10-21, blood pressures elevated up to 171/66, and O2 saturations maintained on room air.  CT scan of the head noted no acute abnormality.  Labs from 5/17 significant for hemoglobin 9.9, sodium 130, potassium 3.3, and hemoglobin A1c 7.8.  UA noted small leukocytes, positive nitrites, many bacteria, and greater than 50 WBCs.  Case was discussed with neurology who recommended admission for further work-up.  She is not a tPA candidate due to being outside the window for administration.  Patient was transferred to ED to ED for need of MRI which showed small acute cortically based infarct in the left anterior motor strip near the right upper extremity represention area, subacute appearing white matter small vessel ischemia in the posterior left corona radiata, and positive also for indeterminate punctate normal enhancement of the anterior left frontal lobe new from 2017.  CTA of the head and neck was significant for bulky plaque at the left ICA origin with high-grade radiographic string sign stenosis, heavily calcified ICA siphons, moderate to severe left anterior genu, and moderate right cavernous ICA siphons stenosis.  She was given 325 mg of aspirin and Plavix.  Review of Systems: As  mentioned in the history of present illness. All other systems reviewed and are negative. Past Medical History:  Diagnosis Date   Diabetes mellitus    Hypertension    Neuropathy    Pancreas disorder    Peripheral vascular disease ([place])    Stroke [facility])    Past Surgical History:  Procedure Laterality Date   CARDIAC  CATHETERIZATION     Social History:  reports that she has been smoking cigarettes. She has never used smokeless tobacco. She reports that she does not drink alcohol and does not use drugs.  Allergies  Allergen Reactions   Dorzolamide Hcl-Timolol Mal Itching and Other (See Comments)    Makes her eyes burn Conjunctival Injection with Burning Visual Acuity Blurry    Brimonidine Rash and Other (See Comments)    Follicular conjunctivitis   Netarsudil-Latanoprost Other (See Comments)    Redness, tearing, burning    Family History  Problem Relation Age of Onset   Cancer Mother    Hypertension Father    Diabetes Father    Cancer Sister    Diabetes Sister    Diabetes Brother    Kidney failure Brother    CAD Brother    Diabetes Other     Prior to Admission medications   Medication Sig Start Date End Date Taking? Authorizing Provider  acetaminophen (TYLENOL) 500 MG tablet Take 1,000 mg by mouth every 6 (six) hours as needed for headache (pain).    [provider]  ADVAIR DISKUS 100-50 MCG/ACT AEPB 2 (two) times daily. 11/04/21   [provider]  albuterol (VENTOLIN HFA) 108 (90 Base) MCG/ACT inhaler Inhale 1 puff into the lungs every 6 (six) hours as needed for wheezing or shortness of breath. 10/22/17   [provider]  amLODipine (NORVASC) 2.5 MG tablet Take 2.5 mg by mouth at bedtime. 04/18/21   [provider]  amLODipine (NORVASC) 5 MG tablet Take 5 mg by mouth every morning. 03/14/21   [provider]  aspirin 81 MG EC tablet Take 81 mg by mouth every morning.    [provider]  atorvastatin (LIPITOR) 40 MG tablet Take 40 mg by mouth every morning. 04/01/21   [provider]  buPROPion (WELLBUTRIN XL) 150 MG 24 hr tablet Take 150 mg by mouth every morning. 03/05/21   [provider]  Calcium-Magnesium-Zinc (CAL-MAG-ZINC PO) Take 1 tablet by mouth every morning.    [provider]  clopidogrel (PLAVIX) 75 MG  tablet Take 75 mg by mouth every morning. 04/18/21   [provider]  ferrous sulfate 325 (65 FE) MG tablet Take 1 tablet (325 mg total) by mouth 2 (two) times daily with a meal. 04/25/21 05/25/21  Antonieta Pert, MD  fluconazole (DIFLUCAN) 150 MG tablet Take 150 mg by mouth See admin instructions. Take one tablet (150 mg) once as needed for yeast infection from antibiotic, may repeat in 3 days if still needed. Patient not taking: No sig reported 04/15/21   [provider]  insulin aspart (NOVOLOG) 100 UNIT/ML FlexPen Inject 4 Units into the skin See admin instructions. Inject 4 units subcutaneously three times daily after meals - plus sliding scale adjustment as directed up to 30 units daily    [provider]  Insulin Degludec (TRESIBA FLEXTOUCH) 100 UNIT/ML SOPN Inject 7 Units into the skin daily. Patient taking differently: Inject 16 Units into the skin daily after breakfast. 12/10/15   Nita Sells, MD  ipratropium-albuterol (DUONEB) 0.5-2.5 (3) MG/3ML SOLN Take 3 mLs by  nebulization every 6 (six) hours as needed for up to 15 days. 04/25/21 05/10/21  Antonieta Pert, MD  latanoprost (XALATAN) 0.005 % ophthalmic solution Place 1 drop into both eyes at bedtime. 04/10/21   [provider]  levothyroxine (SYNTHROID) 50 MCG tablet Take 50 mcg by mouth daily before breakfast.    [provider]  losartan (COZAAR) 50 MG tablet Take 50 mg by mouth every morning.    [provider]  methazolamide (NEPTAZANE) 50 MG tablet Take 50 mg by mouth 2 (two) times daily. 04/02/21   [provider]  Multiple Vitamin (MULTIVITAMIN WITH MINERALS) TABS tablet Take 1 tablet by mouth every morning.    [provider]  Omega-3 Fatty Acids (FISH OIL) 1000 MG CAPS Take 2,000 mg by mouth every morning.    [provider]  oxybutynin (DITROPAN-XL) 10 MG 24 hr tablet Take 10 mg by mouth daily. 11/19/21   [provider]  Pancrelipase,  Lip-Prot-Amyl, 24000-76000 units CPEP Take 2 capsules by mouth daily after breakfast.    [provider]  prednisoLONE acetate (PRED FORTE) 1 % ophthalmic suspension Place 1 drop into the right eye 4 (four) times daily. 04/08/21   [provider]  pregabalin (LYRICA) 300 MG capsule Take 300 mg by mouth at bedtime. 02/05/21   [provider]  sertraline (ZOLOFT) 100 MG tablet Take 100 mg by mouth every morning.    [provider]  sodium chloride 1 g tablet Take 1 g by mouth 3 (three) times daily as needed (brain fog from sodium insufficiency). 12/24/20   [provider]  sulfamethoxazole-trimethoprim (BACTRIM DS) 800-160 MG tablet Take 1 tablet by mouth 2 (two) times daily. 11/27/21   [provider]  Vitamin D, Ergocalciferol, (DRISDOL) 1.25 MG (50000 UNIT) CAPS capsule Take 50,000 Units by mouth every Sunday. 01/28/21   [provider]    Physical Exam: Vitals:   11/28/21 0600 11/28/21 0615 11/28/21 0645 11/28/21 0715  BP: (!) 171/66 (!) 153/60 (!) 152/69 (!) 164/57  Pulse: (!) 56 (!) 57 (!) 57 (!) 55  Resp: '13 10 18 17  '$ Temp:      TempSrc:      SpO2: 99% 99% 98% 98%  Weight:      Height:       Constitutional: Older female who appears to be fatigued but in no acute distress at this time Eyes: PERRL, lids and conjunctivae normal ENMT: Mucous membranes are moist. Posterior pharynx clear of any exudate or lesions.  Neck: normal, supple, no masses, no thyromegaly Respiratory: clear to auscultation bilaterally, no wheezing, no crackles. Normal respiratory effort. No accessory muscle use.  Cardiovascular: Regular rate and rhythm, no murmurs / rubs / gallops. No extremity edema. 2+ pedal pulses. No carotid bruits.  Abdomen: no tenderness, no masses palpated. No hepatosplenomegaly. Bowel sounds positive.  Musculoskeletal: no clubbing / cyanosis. No joint deformity upper and lower extremities. Good ROM, no contractures. Normal muscle  tone.  Skin: no rashes, lesions, ulcers. No induration Neurologic: CN 2-12 grossly intact. Strength 4/5 in the right upper extremity and 5/5 in all other extremities.  Gait not tested. Psychiatric: Normal judgment and insight. Alert and oriented x 3. Normal mood.   Data Reviewed:  EKG reveals sinus bradycardia 56 bpm with RBBB and LAFB  Assessment and Plan: CVA Acute.  Patient presents with complaints of feeling off balance like her equilibrium has been off acutely worse in the last week.  Also reported issues with feeling like speech  was affected at times.  On physical exam patient with some mild right upper extremity weakness.  MRI significant for findings concerning for acute and subacute strokes.  She had been recommended to start on aspirin and Plavix. -Admit to a cardiac telemetry bed -Neurochecks -Check echocardiogram -PT/OT to evaluate and treat -Aspirin and Plavix -Increased atorvastatin 80 mg daily -Transitions of care consulted for possible need of rehab -Neurology consulted,  will follow-up for any further recommendations  Internal carotid artery stenosis, low Acute.  CTA significant for bulky plaque at the left ICA origin with high-grade radiographic string sign stenosis -Vascular surgery consulted -Continue statin  Recurrent UTI Prior to arrival.  Patient reports that she has been dealing with frequent urinary tract infections and was recently diagnosed with a UTI for which she was started on ciprofloxacin.  She had taken doses of the medicine.  Urinalysis was positive for small leukocytes, positive nitrites, many bacteria, greater than 50 WBCs.  She was also recommended to start trimethoprim 100 mg daily after completion of treatment for UTI. -Check urine culture -Did not continue ciprofloxacin due to side effect profile -Empiric antibiotics of Rocephin IV -Start trimethoprim 100 mg daily after completion of treatment  Acute kidney injury Initial labs from yesterday  revealed creatinine of 1.31 with BUN 16.  Baseline creatinine previously had been around 1. -Normal saline IV fluids at 75 mL/h ordered but was discontinued due to reports of SIADH. -Recheck kidney function today and check urine sodium and urine creatinine -Hold possible nephrotoxic agent -Consider further work-up if kidney function does not appear to improve.  Hypokalemia Acute.  Initial potassium 3.3. -Give potassium chloride 40 mEq p.o. -Continue to monitor and replace as needed  Hyponatremia Chronic.  On admission sodium was 130.  Patient reports history of SIADH for which she is on sodium tablets 1 g 3 times daily as needed and advised by fluid restriction of 1500 mL daily.  Records note for the most part patient's sodium levels are in the low 130s, but have intermittently dropped into the 120s. -Fluid restriction of 1500 mL daily for time being. -Continue to monitor sodium levels  Diabetes mellitus type 2, uncontrolled On admission glucose was noted to be 149.  Hemoglobin A1c was found to be 7.8.  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 -Follow-up echocardiogram  Essential hypertension Blood pressures currently maintained.  Patient out of the window for permissive hypertension.  Home blood pressure regimen includes amlodipine 7.25 mg daily and losartan 50 mg daily. -Continue amlodipine -Initially held losartan due to AKI When medically appropriate  Dyslipidemia Lipid panel revealed total cholesterol 85, triglycerides 68, HDL 31, and LDL 40.  Patient's LDL is at goal less than 70.  Home medication regimen includes atorvastatin 40 mg every morning. -Atorvastatin increased to 80 mg daily  Fatigue Patient reports having increased fatigue for which  she sleeps for long periods of time and feels like she does not get good rest.  Denies any issues with her breathing, but did report speech being slurred at the end of the day and eyelids feeling heavy.   -Follow-up thyroid testing -May warrant further investigation/work-up as symptoms can be concern from myasthenia gravis initially.  However, patient is on medications like the methazolamide that as that is one of the  possible side effects  Hypothyroidism Home medication regimen includes levothyroxine 50 mcg daily. -Check TSH -Continue levothyroxine for now and adjust dose if needed  Depression -Continue Zoloft and Wellbutrin  PVD Patient has been on aspirin and statin at home. -Continue aspirin and statin  Glaucoma Patient is on methazolamide which has a side effect profile that is similar to his several complaints that the patient is having. -Continue current medication regimen and eyedrop  Tobacco abuse Patient has at least a 20 smoking pack-year history of tobacco use. -Continue to counsel on need of cessation of tobacco use -Nicotine patch offered  Advance Care Planning:   Code Status: Full Code   Consults: Neurology  Family Communication: None requested at this time  Severity of Illness: The appropriate patient status for this patient is INPATIENT. Inpatient status is judged to be reasonable and necessary in order to provide the required intensity of service to ensure the patient's safety. The patient's presenting symptoms, physical exam findings, and initial radiographic and laboratory data in the context of their chronic comorbidities is felt to place them at high risk for further clinical deterioration. Furthermore, it is not anticipated that the patient will be medically stable for discharge from the hospital within 2 midnights of admission.   * I certify that at the point of admission it is my clinical judgment that the patient will require inpatient hospital care  spanning beyond 2 midnights from the point of admission due to high intensity of service, high risk for further deterioration and high frequency of surveillance required.*  Author: Norval Morton, MD 11/28/2021 7:28 AM  For on call review [email or web address].

## 2021-11-28 NOTE — ED Provider Notes (Signed)
Received patient as a transfer from [facility] [facility] to further evaluate her dizziness.  Patient was initially seen at urgent care and sent to med Center for CT head.  CT did not explain her symptoms which were persistent, she was sent to this ED to undergo MRI.  MRI has been performed and does show a subacute as well as an acute infarct.  Will consult neurology and arrange for admission for further stroke work-up.   Orpah Greek, MD 11/28/21 [id number]

## 2021-11-28 NOTE — Evaluation (Signed)
Occupational Therapy Evaluation Patient Details Name: Michelle Nicholson MRN: [id number] DOB: 1961/04/30 Today's Date: 11/28/2021   History of Present Illness Pt is a 61 y/o female admitted secondary to worsening dizziness. MRI showed small acute cortically based infarct in the left anterior motor  strip near the right upper extremity representation area.  And nearby more subacute appearing white matter small vessel  ischemia in the posterior left corona radiata. PMH includes HTN, DM, and PVD.   Clinical Impression   Pt admitted for concerns listed above. PTA Pt reported that she was independent with all ADL's and IADL's, including driving and travelling. At this time, pt presents with some mild disequilibrium, causing her to take increased time to steady herself. OT provided compensatory techniques to steady herself and find her equilibrium visually in standing and during BADL's. Pt appears at/near her baseline with no further OT needs. Acute OT will sign off at this time.       Recommendations for follow up therapy are one component of a multi-disciplinary discharge planning process, led by the attending physician.  Recommendations may be updated based on patient status, additional functional criteria and insurance authorization.   Follow Up Recommendations  No OT follow up    Assistance Recommended at Discharge None  Patient can return home with the following      Functional Status Assessment  Patient has had a recent decline in their functional status and demonstrates the ability to make significant improvements in function in a reasonable and predictable amount of time.  Equipment Recommendations  None recommended by OT    Recommendations for Other Services       Precautions / Restrictions Precautions Precautions: Fall Restrictions Weight Bearing Restrictions: No      Mobility Bed Mobility Overal bed mobility: Modified Independent                  Transfers Overall  transfer level: Modified independent Equipment used: None Transfers: Sit to/from Stand Sit to Stand: Modified independent (Device/Increase time)           General transfer comment: Increaseed time to find balance      Balance Overall balance assessment: Mild deficits observed, not formally tested                                         ADL either performed or assessed with clinical judgement   ADL Overall ADL's : At baseline;Modified independent                                       General ADL Comments: Indep, no concerns     Vision Baseline Vision/History: 1 Wears glasses Ability to See in Adequate Light: 0 Adequate Patient Visual Report: No change from baseline Vision Assessment?: No apparent visual deficits Additional Comments: Some Vertigo/Vestibular swimmy headedepisodes with head turns     Perception     Praxis      Pertinent Vitals/Pain Pain Assessment Pain Assessment: No/denies pain     Hand Dominance Right   Extremity/Trunk Assessment Upper Extremity Assessment Upper Extremity Assessment: Overall WFL for tasks assessed   Lower Extremity Assessment Lower Extremity Assessment: Overall WFL for tasks assessed   Cervical / Trunk Assessment Cervical / Trunk Assessment: Normal   Communication Communication Communication: No difficulties   Cognition Arousal/Alertness: Awake/alert Behavior  During Therapy: WFL for tasks assessed/performed Overall Cognitive Status: Within Functional Limits for tasks assessed                                       General Comments  VSS on RA, daughter in room, supportive    Exercises     Shoulder Instructions      Home Living Family/patient expects to be discharged to:: Private residence Living Arrangements: Alone Available Help at Discharge: Family;Available PRN/intermittently Type of Home: House Home Access: Stairs to enter [employer] of Steps:  3 Entrance Stairs-Rails: Left Home Layout: One level     Bathroom Shower/Tub: Occupational psychologist: Standard Bathroom Accessibility: Yes How Accessible: Accessible via wheelchair Home Equipment: Kasandra Knudsen - single point      Lives With: Alone    Prior Functioning/Environment Prior Level of Function : Independent/Modified Independent                        OT Problem List: Decreased activity tolerance;Impaired balance (sitting and/or standing);Decreased coordination      OT Treatment/Interventions:      OT Goals(Current goals can be found in the care plan section) Acute Rehab OT Goals Patient Stated Goal: To take care of herself better OT Goal Formulation: With patient Time For Goal Achievement: 11/28/21 Potential to Achieve Goals: Good  OT Frequency:      Co-evaluation              AM-PAC OT "6 Clicks" Daily Activity     Outcome Measure Help from another person eating meals?: None Help from another person taking care of personal grooming?: None Help from another person toileting, which includes using toliet, bedpan, or urinal?: None Help from another person bathing (including washing, rinsing, drying)?: None Help from another person to put on and taking off regular upper body clothing?: None Help from another person to put on and taking off regular lower body clothing?: None 6 Click Score: 24   End of Session Nurse Communication: Mobility status  Activity Tolerance: Patient tolerated treatment well Patient left: in bed;with call bell/phone within reach;with family/visitor present  OT Visit Diagnosis: Unsteadiness on feet (R26.81);Other abnormalities of gait and mobility (R26.89)                Time: 0355-9741 OT Time Calculation (min): 38 min Charges:  OT General Charges $OT Visit: 1 Visit OT Evaluation $OT Eval Low Complexity: 1 Low OT Treatments $Self Care/Home Management : 23-37 mins  Michelle Nicholson H., OTR/L Acute Rehabilitation  Michelle Nicholson  Michelle Nicholson 11/28/2021, 5:46 PM

## 2021-11-28 NOTE — ED Notes (Signed)
Pt is sitting in recliner chair at bedside

## 2021-11-28 NOTE — ED Notes (Signed)
Taken for MRI.

## 2021-11-28 NOTE — Progress Notes (Signed)
Carryover admission to the Day Admitter.  I discussed this case with the EDP, Dr. Betsey Holiday.  Per these discussions:  This is a 61 year old female with history of type 2 diabetes mellitus, hypertension, who is being admitted for further evaluation management of acute ischemic CVA.  She reportedly has been experiencing 1 to 2 months of persistent dizziness, disequilibrium, resulting in frequent falls.  She reportedly did not seek evaluation for these initial symptoms, however, over the course of the last week, she is noted escalation of the above symptoms, resulting in further increasing frequency of falls prompting her to present to local urgent care on 11/27/2021 for further evaluation thereof.  From urgent care she was sent to the [place] and ultimately underwent ED to ED transfer to [facility] emergency department where MRI brain showed evidence of both subacute and acute ischemic infarcts.   EDP discussed patient's case and imaging with on-call neurologist, Dr. Rory Percy, who will formally consult, and recommended admission to the hospitalist service for further stroke work-up, with additional recommendations to follow.  Not a tPA candidate as the patient presents with outside of window for administration of such.    I have placed an order for observation to med telemetry well-developed for further evaluation and management of acute ischemic infarct.   I have placed some additional preliminary admit orders via the adult multi-morbid admission order set. I have also place orders via the acute ischemic CVA focused order set, including checking of lipid panel, hemoglobin A1c, PT/OT consults. Additionally, please follow-up on Dr. Johny Chess formal recommendations.     Babs Bertin, DO Hospitalist

## 2021-11-28 NOTE — Progress Notes (Signed)
Carotid duplex  has been completed. Refer to [facility] under chart review to view preliminary results.   11/28/2021  3:50 PM Teal Bontrager, Bonnye Fava

## 2021-11-28 NOTE — TOC CAGE-AID Note (Signed)
Transition of Care [facility]) - CAGE-AID Screening   Patient Details  Name: Michelle Nicholson MRN: [id number] Date of Birth: Jun 13, 1961  Transition of Care [facility]) CM/SW Contact:    Gaetano Hawthorne Tarpley-Carter, LCSWA Phone Number: 11/28/2021, 10:43 AM   Clinical Narrative: Pt participated in [place].  Pt stated she does smoke cigarettes.  Pt was offered resources, due to usage of substance.    Otha Rickles Tarpley-Carter, MSW, LCSW-A Pronouns:  She/Her/Hers [facility] HealthTransitions of Care Clinical Social Worker Direct Number:  [phone] [email or web address]  CAGE-AID Screening:    Have You Ever Felt You Ought to Cut Down on Your Drinking or Drug Use?: No Have People Annoyed You By [employer] Your Drinking Or Drug Use?: No Have You Felt Bad Or Guilty About Your Drinking Or Drug Use?: No Have You Ever Had a Drink or Used Drugs First Thing In The Morning to Steady Your Nerves or to Get Rid of a Hangover?: No CAGE-AID Score: 0  Substance Abuse Education Offered: Yes  Substance abuse interventions: Scientist, clinical (histocompatibility and immunogenetics)

## 2021-11-28 NOTE — ED Notes (Signed)
Returned from bathroom

## 2021-11-28 NOTE — Evaluation (Signed)
Speech Language Pathology Evaluation Patient Details Name: Evangeline Utley MRN: [id number] DOB: 1960/12/22 Today's Date: 11/28/2021 Time: 9622-2979 SLP Time Calculation (min) (ACUTE ONLY): 16 min  Problem List:  Patient Active Problem List   Diagnosis Date Noted   Acute ischemic stroke ([place]) 11/28/2021   Recurrent UTI 11/28/2021   Internal carotid artery stenosis, left 11/28/2021   Hypokalemia 11/28/2021   Dyslipidemia 11/28/2021   Tobacco abuse 11/28/2021   Fatigue 11/28/2021   Severe sepsis ([place]) 04/21/2021   Shortness of breath 04/21/2021   Acute respiratory failure with hypoxia (H[facility]) 04/21/2021   Chronic hyponatremia 04/21/2021   AKI (acute kidney injury) ([place]) 04/21/2021   Pneumonia 04/21/2021   Bilateral pneumonia 04/20/2021   Uncontrolled type 2 diabetes mellitus with hyperglycemia, with long-term current use of insulin ([place]) 12/09/2015   Essential hypertension 12/09/2015   Meniere syndrome 12/09/2015   Past Medical History:  Past Medical History:  Diagnosis Date   Diabetes mellitus    Hypertension    Neuropathy    Pancreas disorder    Peripheral vascular disease ([place])    Stroke ([place])    Past Surgical History:  Past Surgical History:  Procedure Laterality Date   CARDIAC CATHETERIZATION     HPI:  61 y.o. right-handed female with medical history significant of hypertension, diabetes mellitus type 2, peripheral vascular disease, hypothyroidism, SIADH, tobacco abuse, and recurrent UTIs presents with complaints of feeling unsteady on her feet.  Symptoms have been present for several weeks, but acutely worse in the last week.  She initially attributed symptoms to vertigo.  She noticed the change after going on a 8-hour car ride to attend family members graduation.  Patient noted that they did take frequent stops.  She reports that since that time her equilibrium has been off.  She never felt like the room was spinning around her, but needed to hold onto things to not fall.   Normally patient been able to ambulate without need of assistance.  She states feeling like she was wobbly and was leaning over, but did not notice if she was leading to a specific side.  Symptoms include feeling foggy headed, constant fatigue despite sleeping for 11 hours and speech more slurred near the end of the day. MRI head on 11/28/21 indicated small acute cortically based infarct in L anterior motor strip.  [place] passed; SLE ordered to assess speech/language and cognition.   Assessment / Plan / Recommendation Clinical Impression  Pt seen for speech/language, cognitive assessment with [place] Mental Status Examination (SLUMS) with a score obtained of 30/30 with no deficits noted on this assessment.  Oriented x4, OME unremarkable and all tasks on assessment found to be [facility].  Simple calculation, attention tasks, memory retention/storage all adequate with auditory comprehension task for recalling paragraph, naming and graphic expression all within normal range.  Speech intelligible within complex conversation, although pt stated her "speech is slurred in the evenings intermittently."  Pt is a retired [employer] examiner and warrants "my mind isn't as sharp as it used to be" during conversation.  No further ST recommended in acute setting.  Thank you for this consult.    SLP Assessment  SLP Recommendation/Assessment: Patient does not need any further Speech Language Pathology Services SLP Visit Diagnosis: Cognitive communication deficit (R41.841)    Recommendations for follow up therapy are one component of a multi-disciplinary discharge planning process, led by the attending physician.  Recommendations may be updated based on patient status, additional functional criteria and insurance authorization.  Follow Up Recommendations  No SLP follow up    Assistance Recommended at Discharge  Intermittent Supervision/Assistance  Functional Status Assessment Patient has had a recent decline  in their functional status and demonstrates the ability to make significant improvements in function in a reasonable and predictable amount of time.  Frequency and Duration  (evaluation only)         SLP Evaluation Cognition  Overall Cognitive Status: Within Functional Limits for tasks assessed Orientation Level: Oriented X4 Attention: Sustained Sustained Attention: Appears intact Memory: Appears intact Awareness: Appears intact Problem Solving: Appears intact Safety/Judgment: Appears intact       Comprehension  Auditory Comprehension Overall Auditory Comprehension: Appears within functional limits for tasks assessed Conversation: Complex Visual Recognition/Discrimination Discrimination: Within Function Limits Reading Comprehension Reading Status: Within funtional limits    Expression Expression Primary Mode of Expression: Verbal Verbal Expression Overall Verbal Expression: Appears within functional limits for tasks assessed Level of Generative/Spontaneous Verbalization: Conversation Repetition: No impairment Naming: No impairment Pragmatics: No impairment Non-Verbal Means of Communication: Not applicable Written Expression Dominant Hand: Right Written Expression: Within Functional Limits   Oral / Motor  Oral Motor/Sensory Function Overall Oral Motor/Sensory Function: Within functional limits Motor Speech Overall Motor Speech: Appears within functional limits for tasks assessed Respiration: Within functional limits Phonation: Normal Resonance: Within functional limits Articulation: Within functional limitis Intelligibility: Intelligible Motor Planning: Witnin functional limits Motor Speech Errors: Not applicable            Elvina Sidle, M.S., CCC-SLP 11/28/2021, 2:20 PM

## 2021-11-28 NOTE — ED Notes (Signed)
Pt ambulated to restroom. 

## 2021-11-28 NOTE — ED Notes (Signed)
Provider at bedside

## 2021-11-28 NOTE — ED Notes (Signed)
Pt instructed to go straight to [facility] ED for continued care. All questions/concerns addressed at this time. Pt a&o x 4, stable, and ambulatory to ED exit.

## 2021-11-28 NOTE — Evaluation (Signed)
Physical Therapy Evaluation Patient Details Name: Michelle Nicholson MRN: [id number] DOB: 1961-05-05 Today's Date: 11/28/2021  History of Present Illness  Pt is a 61 y/o female admitted secondary to worsening dizziness. MRI showed small acute cortically based infarct in the left anterior motor  strip near the right upper extremity representation area.  And nearby more subacute appearing white matter small vessel  ischemia in the posterior left corona radiata. PMH includes HTN, DM, and PVD.  Clinical Impression  Pt admitted secondary to problem above with deficits below. Pt requiring min guard up to min A when performing horizontal/vertical head turns during gait. Pt reports feelings of disequilibrium during mobility tasks seem to have improved some. Recommending outpatient PT at d/c to address current deficits. Will continue to follow acutely.      Recommendations for follow up therapy are one component of a multi-disciplinary discharge planning process, led by the attending physician.  Recommendations may be updated based on patient status, additional functional criteria and insurance authorization.  Follow Up Recommendations Outpatient PT    Assistance Recommended at Discharge Intermittent Supervision/Assistance  Patient can return home with the following  Assist for transportation;Assistance with cooking/housework    Equipment Recommendations None recommended by PT  Recommendations for Other Services       Functional Status Assessment Patient has had a recent decline in their functional status and demonstrates the ability to make significant improvements in function in a reasonable and predictable amount of time.     Precautions / Restrictions Precautions Precautions: Fall Restrictions Weight Bearing Restrictions: No      Mobility  Bed Mobility Overal bed mobility: Modified Independent                  Transfers Overall transfer level: Needs assistance Equipment used:  None Transfers: Sit to/from Stand Sit to Stand: Min guard           General transfer comment: Min guard for safety    Ambulation/Gait Ambulation/Gait assistance: Min guard, Min assist Gait Distance (Feet): 120 Feet Assistive device: IV Pole, None Gait Pattern/deviations: Step-through pattern, Decreased stride length, Drifts right/left Gait velocity: Decreased     General Gait Details: Mild unsteadiness noted with horizontal and vertical head turns and required min A for steadying. Otherwise requiring min guard. Educated about using cane to increase dynamic stability  Stairs            Wheelchair Mobility    Modified Rankin (Stroke Patients Only) Modified Rankin (Stroke Patients Only) Pre-Morbid Rankin Score: No symptoms Modified Rankin: Moderately severe disability     Balance Overall balance assessment: Needs assistance Sitting-balance support: No upper extremity supported, Feet supported Sitting balance-Leahy Scale: Good     Standing balance support: No upper extremity supported, Single extremity supported Standing balance-Leahy Scale: Fair                               Pertinent Vitals/Pain Pain Assessment Pain Assessment: No/denies pain    Home Living Family/patient expects to be discharged to:: Private residence Living Arrangements: Alone Available Help at Discharge: Family Type of Home: House Home Access: Stairs to enter Entrance Stairs-Rails: Left Entrance Stairs-Number of Steps: 3   Home Layout: One level Home Equipment: Cane - single point      Prior Function Prior Level of Function : Independent/Modified Independent                     Hand  Dominance        Extremity/Trunk Assessment   Upper Extremity Assessment Upper Extremity Assessment: Defer to OT evaluation    Lower Extremity Assessment Lower Extremity Assessment: Generalized weakness    Cervical / Trunk Assessment Cervical / Trunk Assessment: Normal   Communication   Communication: No difficulties  Cognition Arousal/Alertness: Awake/alert Behavior During Therapy: WFL for tasks assessed/performed Overall Cognitive Status: Within Functional Limits for tasks assessed                                 General Comments: WFL for basic tasks        General Comments      Exercises     Assessment/Plan    PT Assessment Patient needs continued PT services  PT Problem List Decreased balance;Decreased mobility       PT Treatment Interventions DME instruction;Gait training;Stair training;Functional mobility training;Therapeutic exercise;Therapeutic activities;Balance training;Patient/family education    PT Goals (Current goals can be found in the Care Plan section)  Acute Rehab PT Goals Patient Stated Goal: to go home PT Goal Formulation: With patient Time For Goal Achievement: 12/12/21 Potential to Achieve Goals: Good    Frequency Min 4X/week     Co-evaluation               AM-PAC PT "6 Clicks" Mobility  Outcome Measure Help needed turning from your back to your side while in a flat bed without using bedrails?: None Help needed moving from lying on your back to sitting on the side of a flat bed without using bedrails?: None Help needed moving to and from a bed to a chair (including a wheelchair)?: A Little Help needed standing up from a chair using your arms (e.g., wheelchair or bedside chair)?: A Little Help needed to walk in hospital room?: A Little Help needed climbing 3-5 steps with a railing? : A Little 6 Click Score: 20    End of Session Equipment Utilized During Treatment: Gait belt Activity Tolerance: Patient tolerated treatment well Patient left: in chair;with call bell/phone within reach (in recliner in ED) Nurse Communication: Mobility status PT Visit Diagnosis: Unsteadiness on feet (R26.81)    Time: 1610-9604 PT Time Calculation (min) (ACUTE ONLY): 19 min   Charges:   PT  Evaluation $PT Eval Low Complexity: 1 Low          [name], DPT  Acute Rehabilitation Services  Pager: [phone] Office: [phone]   [name] 11/28/2021, 11:13 AM

## 2021-11-29 ENCOUNTER — Encounter (HOSPITAL_COMMUNITY): Payer: Self-pay | Admitting: Vascular Surgery

## 2021-11-29 ENCOUNTER — Other Ambulatory Visit (HOSPITAL_COMMUNITY): Payer: Self-pay

## 2021-11-29 ENCOUNTER — Other Ambulatory Visit: Payer: Self-pay

## 2021-11-29 DIAGNOSIS — R8281 Pyuria: Secondary | ICD-10-CM

## 2021-11-29 DIAGNOSIS — I6522 Occlusion and stenosis of left carotid artery: Secondary | ICD-10-CM | POA: Diagnosis not present

## 2021-11-29 DIAGNOSIS — I10 Essential (primary) hypertension: Secondary | ICD-10-CM | POA: Diagnosis not present

## 2021-11-29 DIAGNOSIS — Z01810 Encounter for preprocedural cardiovascular examination: Secondary | ICD-10-CM

## 2021-11-29 DIAGNOSIS — E871 Hypo-osmolality and hyponatremia: Secondary | ICD-10-CM | POA: Diagnosis not present

## 2021-11-29 DIAGNOSIS — N179 Acute kidney failure, unspecified: Secondary | ICD-10-CM

## 2021-11-29 DIAGNOSIS — E785 Hyperlipidemia, unspecified: Secondary | ICD-10-CM | POA: Diagnosis not present

## 2021-11-29 DIAGNOSIS — I639 Cerebral infarction, unspecified: Secondary | ICD-10-CM | POA: Diagnosis not present

## 2021-11-29 LAB — URINE CULTURE: Culture: NO GROWTH

## 2021-11-29 LAB — GLUCOSE, CAPILLARY
Glucose-Capillary: 107 mg/dL — ABNORMAL HIGH (ref 70–99)
Glucose-Capillary: 203 mg/dL — ABNORMAL HIGH (ref 70–99)

## 2021-11-29 MED ORDER — ASPIRIN 325 MG PO TABS
325.0000 mg | ORAL_TABLET | Freq: Every day | ORAL | 3 refills | Status: DC
  Filled 2021-11-29: qty 30, 30d supply, fill #0

## 2021-11-29 MED ORDER — CLOPIDOGREL BISULFATE 75 MG PO TABS
75.0000 mg | ORAL_TABLET | Freq: Every day | ORAL | 0 refills | Status: DC
Start: 2021-11-30 — End: 2021-12-27
  Filled 2021-11-29: qty 30, 30d supply, fill #0

## 2021-11-29 MED ORDER — NICOTINE 21 MG/24HR TD PT24
21.0000 mg | MEDICATED_PATCH | TRANSDERMAL | 1 refills | Status: DC
  Filled 2021-11-29: qty 28, 28d supply, fill #0

## 2021-11-29 NOTE — Consult Note (Addendum)
Cardiology Consultation:   Patient ID: Michelle Nicholson MRN: [id number]; DOB: [birth year]  Admit date: 11/27/2021 Date of Consult: 11/29/2021  PCP:  [facility] (Inactive)   [place] Providers Cardiologist:  [place]    Patient Profile:   Michelle Nicholson is a 61 y.o. 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 Virl Cagey.  History of Present Illness:   Michelle Nicholson presented for evaluation of worsening balance issue for past few months.  She had a worsening episode last week while attending graduation in [place].  MRI of brain showed punctate ischemic infarct in the left hemisphere.  This is followed by neurology.  CTA of head and neck showed severe stenosis with radiographic string read in the left ICA.  Patient was seen by vascular surgery and scheduled for carotid endarterectomy early next week.  This could be pursued as outpatient if discharged.  Given exertional dyspnea cardiac clearance recommended.  Carotid Doppler with 80 to 99% left ICA stenosis. Echocardiogram this admission showed LV function of 60 to 78%, grade 1 diastolic dysfunction.  No regional wall motion abnormality.  Patient reported long standing hx of exertional dyspnea with out chest pressure/tightness. Established care cardiologist at [place] for further evaluation. Underwent cardiac cath 08/2020 showing mild-moderate non obstructive CAD (see detailed below). Treated medically. It was felt her symptoms due to ongoing tobacco smoking.  Avoided beta-blocker secondary to baseline heart rate in 60s.  Patient reports no change in her dyspnea since cardiac catheterization.  She is very active doing  walking and dancing over the weekend.  She never had exertional chest pressure or tightness or stop doing what she is doing.  Denies palpitation, orthopnea, PND, syncope, lower extremity edema or melena.  Past Medical History:  Diagnosis Date   Diabetes mellitus    Hypertension    Neuropathy    Pancreas disorder    Peripheral vascular disease ([place])    Stroke [facility])     Past Surgical History:  Procedure Laterality Date   CARDIAC CATHETERIZATION       Inpatient Medications: Scheduled Meds:  amLODipine  2.5 mg Oral QHS   amLODipine  5 mg Oral q morning   aspirin  325 mg Oral Daily   atorvastatin  40 mg Oral Daily   buPROPion  150 mg Oral q morning   clopidogrel  75 mg Oral Daily   enoxaparin (LOVENOX) injection  40 mg Subcutaneous Q24H   ferrous sulfate  325 mg Oral q morning   folic acid  1 mg Oral q morning   insulin aspart  0-6 Units Subcutaneous TID WC   insulin aspart  4 Units Subcutaneous TID WC   insulin glargine-yfgn  16 Units Subcutaneous QPC breakfast   latanoprost  1 drop Both Eyes QHS   levothyroxine  50 mcg Oral QAC breakfast   lipase/protease/amylase  24,000 Units Oral QPC breakfast   nicotine  21 mg Transdermal Daily   oxybutynin  10 mg Oral q morning   pregabalin  300 mg Oral BID   sertraline  100 mg Oral q morning   vitamin B-12  100 mcg Oral q morning   Continuous Infusions:  cefTRIAXone (ROCEPHIN)  IV Stopped (11/28/21 0929)   PRN Meds: acetaminophen **  OR** acetaminophen, senna-docusate  Allergies:    Allergies  Allergen Reactions   Dorzolamide Hcl-Timolol Mal Itching and Other (See Comments)    Makes her eyes burn Conjunctival Injection with Burning Visual Acuity Blurry    Brimonidine Rash and Other (See Comments)    Follicular conjunctivitis   Netarsudil-Latanoprost Other (See Comments)    Redness, tearing, burning    Social History:   Social History   Socioeconomic History   Marital status: Widowed    Spouse name: Not on file    Number of children: Not on file   Years of education: Not on file   Highest education level: Not on file  Occupational History   Not on file  Tobacco Use   Smoking status: Some Days    Types: Cigarettes   Smokeless tobacco: Never  Vaping Use   Vaping Use: Never used  Substance and Sexual Activity   Alcohol use: No   Drug use: No   Sexual activity: Not on file  Other Topics Concern   Not on file  Social History Narrative   Not on file   Social Determinants of Health   Financial Resource Strain: Not on file  Food Insecurity: Not on file  Transportation Needs: Not on file  Physical Activity: Not on file  Stress: Not on file  Social Connections: Not on file  Intimate Partner Violence: Not on file    Family History:    Family History  Problem Relation Age of Onset   Cancer Mother    Hypertension Father    Diabetes Father    Cancer Sister    Diabetes Sister    Diabetes Brother    Kidney failure Brother    CAD Brother    Diabetes Other      ROS:  Please see the history of present illness.  All other ROS reviewed and negative.     Physical Exam/Data:   Vitals:   11/28/21 2345 11/29/21 0311 11/29/21 0559 11/29/21 0747  BP: 130/62 125/61  114/62  Pulse: 61 62  60  Resp: '10 17  17  '$ Temp: 98.4 F (36.9 C) 97.7 F (36.5 C)  97.7 F (36.5 C)  TempSrc: Oral Oral  Oral  SpO2: 98% 94%  96%  Weight:   70.2 kg   Height:        Intake/Output Summary (Last 24 hours) at 11/29/2021 0924 Last data filed at 11/28/2021 1012 Gross per 24 hour  Intake 225.59 ml  Output --  Net 225.59 ml      11/29/2021    5:59 AM 11/28/2021    3:10 PM 11/27/2021    9:06 PM  Last 3 Weights  Weight (lbs) 154 lb 12.2 oz 150 lb 150 lb  Weight (kg) 70.2 kg 68.04 kg 68.04 kg     Body mass index is 22.85 kg/m.  General:  Well nourished, well developed, in no acute distress HEENT: normal Neck: no JVD Vascular: + carotid bruits; Distal pulses 2+ bilaterally Cardiac:  normal S1, S2; RRR;  no murmur  Lungs:  clear to auscultation bilaterally, no wheezing, rhonchi or rales  Abd: soft, nontender, no hepatomegaly  Ext: no edema Musculoskeletal:  No deformities, BUE and BLE strength normal and equal Skin: warm and dry  Neuro:  CNs 2-12 intact, no focal abnormalities noted Psych:  Normal affect   EKG:  The EKG was personally reviewed and demonstrates: Sinus bradycardia, right bundle branch block Telemetry:  Telemetry was personally reviewed and demonstrates:  sinus rhythm  Relevant CV Studies:  Echo 11/28/2021  1. Left ventricular ejection fraction, by estimation, is 60 to 65%. The  left ventricle has normal function. The left ventricle has no regional  wall motion abnormalities. Left ventricular diastolic parameters are  consistent with Grade I diastolic  dysfunction (impaired relaxation).   2. Right ventricular systolic function is normal. The right ventricular  size is normal.   3. The mitral valve is normal in structure. Trivial mitral valve  regurgitation. No evidence of mitral stenosis.   4. The aortic valve is tricuspid. There is mild calcification of the  aortic valve. There is mild thickening of the aortic valve. Aortic valve  regurgitation is not visualized. Aortic valve sclerosis is present, with  no evidence of aortic valve stenosis.   5. Aortic dilatation noted. There is mild dilatation of the aortic root,  measuring 38 mm.   6. The inferior vena cava is normal in size with greater than 50%  respiratory variability, suggesting right atrial pressure of 3 mmHg.   Cath 08/22/2020 Right Heart:  Right antecubital venous access site.  5 French sheath with 5 French  Swan-Ganz catheter advanced to the pulmonary artery.  Pressures:  RA  6/4 (4)  RV  23/6  PA  24/15 (19)  PCWP 7/8 (8)   O2 saturations:  RA: 66%  PA: 67%  Radial artery: 90%   CO:  Thermodilution: 4.04 L/min.  TD CI:  2.2 L/min/m2  Fick CO: 4.34 L/min  Fick CI: 2.2 L/min/m2   CORONARY  ARTERIOGRAM:    Left Main --40-50% ostial stenosis, heavily calcified left main.  IVUS was  performed and showed cross-sectional area of 0.842 cm2.    Left Anterior Descending -- large vessel with mild luminal irregularities,  20% narrowing at the takeoff of the very small D2.  Significant  calcification proximally.  Diagonals --small diagonal branches, no obstructive disease.  Circumflex --large vessel that terminates in an obtuse marginal.  There  are luminal irregularities proximally and then a 50% mid vessel stenosis.    IFR was 0.94.  In the terminal OM there are 30 and 50% narrowings as it  tapers to a small vessel distally.  RCA --large vessel that gives off the PDA and 3 PLV branches.  30%  calcified, ostial stenosis, 40% proximal stenosis, and 30 to 40%  mid-vessel narrowing.  PDA --normal   LEFT VENTRICULOGRAM:  Not done to conserve contrast.  See echo.   PCI:   None.   iFR of CFX and IVUS of LMCA:  A 62F JL4 guiding catheter was seated in the ostium of the LMCA and the IFR  wire was advanced to the tip of the guiding catheter and normalized.  It  was then advanced distal to the 50% stenosis in the mid circumflex.  IFR  was 0.94 indicating no obstruction to blood flow.  The iFR catheter was removed and the IVUS catheter was advanced over the  iFR wire into the LMCA.  Pullback was performed which showed the LMCA  ostial stenosis.  Area was 0.842 cm2, indicating nonobstructive disease.  The wire was removed and angiography revealed no vessel complications.   COMPLICATIONS:  None.  EBL < 25 mL.  No samples stored.    CONCLUSIONS:    1.  Nonobstructive CAD.  Calcification in all arteries.  A.  40-50% ostial LMCA stenosis.  Cross-sectional area by IVUS 0.842 cm2.    Heavily calcified LMCA.  B.  50% mid-CFX stenosis with IFR 0.94.  C.  30-40% RCA.  2.  Normal right heart pressures  3.  Hemostasis of right brachial venous access site with manual  compression.  Hemostasis  of RRA with TR band.    RECOMMENDATIONS:  1.  Optimize medical therapy for CAD and PVD.  2.  Per Dr. Coralee Pesa.   Laboratory Data:  High Sensitivity Troponin:  No results for input(s): TROPONINIHS in the last 720 hours.   Chemistry Recent Labs  Lab 11/27/21 2121  NA 130*  K 3.3*  CL 108  CO2 16*  GLUCOSE 149*  BUN 16  CREATININE 1.31*  CALCIUM 8.2*  GFRNONAA 46*  ANIONGAP 6    Recent Labs  Lab 11/27/21 2121  PROT 6.4*  ALBUMIN 3.5  AST 18  ALT 12  ALKPHOS 84  BILITOT 0.5   Lipids  Recent Labs  Lab 11/28/21 0646  CHOL 85  TRIG 68  HDL 31*  LDLCALC 40  CHOLHDL 2.7    Hematology Recent Labs  Lab 11/27/21 2121  WBC 8.0  RBC 3.24*  HGB 9.9*  HCT 29.4*  MCV 90.7  MCH 30.6  MCHC 33.7  RDW 14.0  PLT 223    Radiology/Studies:  CT ANGIO HEAD NECK W WO CM  Result Date: 11/28/2021 CLINICAL DATA:  61 year old female with neurologic deficit. Brain MRI this morning reveals small acute and subacute cortical and white matter infarcts in the posterior left MCA territory. EXAM: CT ANGIOGRAPHY HEAD AND NECK TECHNIQUE: Multidetector CT imaging of the head and neck was performed using the standard protocol during bolus administration of intravenous contrast. Multiplanar CT image reconstructions and MIPs were obtained to evaluate the vascular anatomy. Carotid stenosis measurements (when applicable) are obtained utilizing NASCET criteria, using the distal internal carotid diameter as the denominator. RADIATION DOSE REDUCTION: This exam was performed according to the departmental dose-optimization program which includes automated exposure control, adjustment of the mA and/or kV according to patient size and/or use of iterative reconstruction technique. CONTRAST:  32m OMNIPAQUE IOHEXOL 350 MG/ML SOLN COMPARISON:  Brain MRI 0434 hours today. FINDINGS: CTA NECK Skeleton: Left TMJ degeneration. Cervical spine degeneration including fairly advanced facet arthropathy on the right C4-C5.  No acute osseous abnormality identified. Upper chest: Mild paraseptal emphysema or scarring in the left upper lung. No superior mediastinal lymphadenopathy. Other neck: No acute finding. Aortic arch: Calcified aortic atherosclerosis. Extensive great vessel origin calcified atherosclerosis. Three vessel arch configuration. Right carotid system: Brachiocephalic artery plaque without stenosis. Mildly tortuous right CCA. Moderate calcified plaque at the right ICA origin and bulb but less than 50 % stenosis with respect to the distal vessel. Left carotid system: Calcified left CCA origin plaque without stenosis. Tortuous proximal left CCA with combined soft and calcified plaque in the vessel at the level of the larynx, less than 50 % stenosis with respect to the distal vessel. Pronounced low-density soft plaque there series 5, image 120. Bulky calcified plaque at the left ICA origin and bulb with high-grade stenosis essentially a radiographic string sign at the origin (series 9, image 77 and series 7, image 206. But the vessel remains patent. Additional bulb calcified plaque without stenosis. Vertebral arteries: Proximal right subclavian artery calcified plaque with only mild stenosis. Mild soft and calcified plaque at the right vertebral artery origin with only mild stenosis. Dominant right vertebral artery otherwise normal to the skull base. Moderate proximal left subclavian artery plaque with 50% stenosis. Non dominant left vertebral artery with mild to moderate soft plaque stenosis at its origin on series  8, image 154. Left vertebral remains diminutive throughout the neck but patent to the skull base. CTA HEAD Posterior circulation: Right V4 segment is dominant and primarily supplies the basilar. Right V4 calcified plaque but only mild distal right vertebral stenosis. Diminutive left vertebral artery terminates in PICA. Patent right PICA origin. Patent basilar artery with mild tortuosity. Patent SCA and right PCA  origins. Fetal type left PCA origin. Right posterior communicating artery diminutive or absent. Bilateral PCA branches are within normal limits. Anterior circulation: Both ICA siphons are patent but heavily calcified. On the left there is moderate to severe stenosis at the left anterior genu on series 7, image 112. Normal left posterior communicating artery origin. On the right there is moderate stenosis throughout the cavernous segment, mild supraclinoid stenosis. Patent carotid termini, MCA and ACA origins. Left A1 segment appears mildly dominant. Normal anterior communicating artery and bilateral ACA branches. Left MCA M1 segment and bifurcation are patent without stenosis. Right MCA M1 segment and bifurcation are patent without stenosis. Bilateral MCA branches are within normal limits. Venous sinuses: Patent. Anatomic variants: Dominant right vertebral artery. The left is diminutive and terminates in PICA. Fetal type left PCA origin. Review of the MIP images confirms the above findings IMPRESSION: 1. Negative for large vessel occlusion. 2. Positive for advanced atherosclerosis in the neck and at the skull base: - soft and calcified Left Carotid atherosclerosis in the neck with bulky plaque at the Left ICA origin and high-grade RADIOGRAPHIC STRING SIGN stenosis. - heavily calcified ICA siphons. Moderate to Severe Left anterior genu and moderate Right cavernous ICA siphon stenoses. - Only mild vertebral artery stenosis. 3. Aortic Atherosclerosis (ICD10-I70.0). Electronically Signed   By: Genevie Ann M.D.   On: 11/28/2021 07:32   CT Head Wo Contrast  Result Date: 11/27/2021 CLINICAL DATA:  Stroke-like symptoms EXAM: CT HEAD WITHOUT CONTRAST TECHNIQUE: Contiguous axial images were obtained from the base of the skull through the vertex without intravenous contrast. RADIATION DOSE REDUCTION: This exam was performed according to the departmental dose-optimization program which includes automated exposure control,  adjustment of the mA and/or kV according to patient size and/or use of iterative reconstruction technique. COMPARISON:  06/02/2021 FINDINGS: Brain: No evidence of acute infarction, hemorrhage, hydrocephalus, extra-axial collection or mass lesion/mass effect. Vascular: No hyperdense vessel or unexpected calcification. Skull: Normal. Negative for fracture or focal lesion. Sinuses/Orbits: No acute finding. Other: None. IMPRESSION: No acute intracranial abnormality noted. Electronically Signed   By: Inez Catalina M.D.   On: 11/27/2021 21:33   MR Brain W and Wo Contrast  Result Date: 11/28/2021 CLINICAL DATA:  61 year old female with stroke-like symptoms. Loss of equilibrium. History of stroke. EXAM: MRI HEAD WITHOUT AND WITH CONTRAST TECHNIQUE: Multiplanar, multiecho pulse sequences of the brain and surrounding structures were obtained without and with intravenous contrast. CONTRAST:  27m GADAVIST GADOBUTROL 1 MMOL/ML IV SOLN COMPARISON:  Brain MRI 12/09/2015.  Head CT 11/27/2021. FINDINGS: Brain: Normal cerebral volume. Small cortically based focus of restricted diffusion in the left anterior motor strip superiorly (series 2, image 46) near the right upper extremity representation area is accompanied by mild T2 and FLAIR hyperintensity (series 6, image 33). No abnormal enhancement. More subtle and possibly acute or subacute nearby posterior left frontal white matter abnormal diffusion (series 2, image 42 and series 3 image 19) in the posterior left corona radiata. Some associated T2 and FLAIR hyperintensity. No other restricted diffusion. No midline shift, mass effect, evidence of mass lesion, ventriculomegaly, extra-axial collection or acute intracranial hemorrhage.  Cervicomedullary junction and pituitary are within normal limits. Chronic heterogeneous T2 and FLAIR hyperintensity throughout the pons (series 5, image 10) appears mildly progressed since 2017. No superimposed cortical encephalomalacia or chronic  cerebral blood products. Outside of the abnormal diffusion there are scattered small T2 and FLAIR hyperintense areas in the bilateral cerebral white matter which have increased since 2017, mild to moderate for age. Cerebellum and deep gray nuclei appear to remain normal. There is a punctate focus of abnormal enhancement in the anterior left frontal lobe on series 10, image 31 and series 11, image 28. No associated signal abnormality here on the remaining sequences. This is new since 2017. No other abnormal enhancement identified. No dural thickening Vascular: Major intracranial vascular flow voids are stable since 2017. The distal right vertebral artery appears dominant and the left diminutive possibly terminating in PICA. Pneumatized left anterior clinoid process redemonstrated. The major dural venous sinuses are enhancing and appear to be patent. Skull and upper cervical spine: Stable and negative. Visualized bone marrow signal is within normal limits. Sinuses/Orbits: Stable and negative. Other: Mild right mastoid effusion is chronic, mildly increased since 2017. Left mastoids remain clear. Other Visible internal auditory structures appear normal. Negative visible scalp and face. IMPRESSION: 1. Small acute cortically based infarct in the left anterior motor strip near the right upper extremity representation area. And nearby more subacute appearing white matter small vessel ischemia in the posterior left corona radiata. No associated hemorrhage or mass effect. 2. Positive also for indeterminate punctate abnormal enhancement in the anterior left frontal lobe (series 10, image 31), new since 2017. In light of #1, perhaps this is enhancement of a late subacute small vessel infarct. But a repeat Brain MRI without and with contrast is recommended in 3 months to re-evaluate. 3. Chronically advanced signal changes in the pons, nonspecific but favor small vessel disease related in this setting. Mild to moderate for age  cerebral white matter signal changes have also progressed since 2017. Electronically Signed   By: Genevie Ann M.D.   On: 11/28/2021 05:42   ECHOCARDIOGRAM COMPLETE  Result Date: 11/28/2021    ECHOCARDIOGRAM REPORT   Patient Name:   ATIRA BORELLO Date of Exam: 11/28/2021 Medical Rec #:  [id number]  Height:       69.0 in Accession #:    [id number] Weight:       150.0 lb Date of Birth:  Nov 01, 1960  BSA:          1.828 m Patient Age:    33 years   BP:           157/60 mmHg Patient Gender: F          HR:           53 bpm. Exam Location:  Inpatient Procedure: 2D Echo, Cardiac Doppler and Color Doppler Indications:    Stroke  History:        Patient has prior history of Echocardiogram examinations, most                 recent 04/21/2021. Risk Factors:Hypertension and Diabetes.  Sonographer:    Jefferey Pica Referring Phys: [id number] ASHISH ARORA IMPRESSIONS  1. Left ventricular ejection fraction, by estimation, is 60 to 65%. The left ventricle has normal function. The left ventricle has no regional wall motion abnormalities. Left ventricular diastolic parameters are consistent with Grade I diastolic dysfunction (impaired relaxation).  2. Right ventricular systolic function is normal. The right ventricular size is normal.  3. The mitral  valve is normal in structure. Trivial mitral valve regurgitation. No evidence of mitral stenosis.  4. The aortic valve is tricuspid. There is mild calcification of the aortic valve. There is mild thickening of the aortic valve. Aortic valve regurgitation is not visualized. Aortic valve sclerosis is present, with no evidence of aortic valve stenosis.  5. Aortic dilatation noted. There is mild dilatation of the aortic root, measuring 38 mm.  6. The inferior vena cava is normal in size with greater than 50% respiratory variability, suggesting right atrial pressure of 3 mmHg. FINDINGS  Left Ventricle: Left ventricular ejection fraction, by estimation, is 60 to 65%. The left ventricle has normal  function. The left ventricle has no regional wall motion abnormalities. The left ventricular internal cavity size was normal in size. There is  no left ventricular hypertrophy. Left ventricular diastolic parameters are consistent with Grade I diastolic dysfunction (impaired relaxation). Right Ventricle: The right ventricular size is normal. No increase in right ventricular wall thickness. Right ventricular systolic function is normal. Left Atrium: Left atrial size was normal in size. Right Atrium: Right atrial size was normal in size. Pericardium: There is no evidence of pericardial effusion. Mitral Valve: The mitral valve is normal in structure. Trivial mitral valve regurgitation. No evidence of mitral valve stenosis. Tricuspid Valve: The tricuspid valve is normal in structure. Tricuspid valve regurgitation is trivial. No evidence of tricuspid stenosis. Aortic Valve: The aortic valve is tricuspid. There is mild calcification of the aortic valve. There is mild thickening of the aortic valve. Aortic valve regurgitation is not visualized. Aortic valve sclerosis is present, with no evidence of aortic valve stenosis. Aortic valve peak gradient measures 4.6 mmHg. Pulmonic Valve: The pulmonic valve was normal in structure. Pulmonic valve regurgitation is not visualized. No evidence of pulmonic stenosis. Aorta: Aortic dilatation noted. There is mild dilatation of the aortic root, measuring 38 mm. Venous: The inferior vena cava is normal in size with greater than 50% respiratory variability, suggesting right atrial pressure of 3 mmHg. IAS/Shunts: No atrial level shunt detected by color flow Doppler.  LEFT VENTRICLE PLAX 2D LVIDd:         4.60 cm   Diastology LVIDs:         2.70 cm   LV e' medial:    4.00 cm/s LV PW:         0.90 cm   LV E/e' medial:  13.2 LV IVS:        1.00 cm   LV e' lateral:   6.89 cm/s LVOT diam:     2.10 cm   LV E/e' lateral: 7.6 LV SV:         93 LV SV Index:   51 LVOT Area:     3.46 cm  RIGHT  VENTRICLE            IVC RV S prime:     8.95 cm/s  IVC diam: 1.70 cm TAPSE (M-mode): 2.7 cm LEFT ATRIUM             Index        RIGHT ATRIUM           Index LA diam:        3.70 cm 2.02 cm/m   RA Area:     16.60 cm LA Vol (A2C):   47.0 ml 25.71 ml/m  RA Volume:   41.40 ml  22.64 ml/m LA Vol (A4C):   38.0 ml 20.79 ml/m LA Biplane Vol: 42.6 ml 23.30 ml/m  AORTIC VALVE                 PULMONIC VALVE AV Area (Vmax): 3.38 cm     PV Vmax:       0.60 m/s AV Vmax:        106.70 cm/s  PV Peak grad:  1.4 mmHg AV Peak Grad:   4.6 mmHg LVOT Vmax:      104.00 cm/s LVOT Vmean:     64.400 cm/s LVOT VTI:       0.269 m  AORTA Ao Root diam: 3.80 cm Ao Asc diam:  3.70 cm MITRAL VALVE MV Area (PHT): 1.56 cm    SHUNTS MV Decel Time: 487 msec    Systemic VTI:  0.27 m MV E velocity: 52.70 cm/s  Systemic Diam: 2.10 cm MV A velocity: 87.00 cm/s MV E/A ratio:  0.61 Jenkins Rouge MD Electronically signed by Jenkins Rouge MD Signature Date/Time: 11/28/2021/11:56:49 AM    Final    VAS US CAROTID  Result Date: 11/28/2021 Carotid Arterial Duplex Study Patient Name:  PORSCHA AXLEY  Date of Exam:   11/28/2021 Medical Rec #: [id number]   Accession #:    [id number] Date of Birth: 12-29-1960   Patient Gender: F Patient Age:   6 years Exam Location:  [facility] Procedure:      VAS US CAROTID Referring Phys: Vonna Kotyk ROBINS --------------------------------------------------------------------------------  Indications:       Carotid artery disease. Comparison Study:  No prior carotid studies. CTA neck on 11/28/21 showed less                    than 50% stenosis on the right side, left ICA high grade                    radiographic string sign stenosis at the origin. Performing Technologist: Oda Cogan RDMS, RVT  Examination Guidelines: A complete evaluation includes B-mode imaging, spectral Doppler, color Doppler, and power Doppler as needed of all accessible portions of each vessel. Bilateral testing is considered an integral part of a  complete examination. Limited examinations for reoccurring indications may be performed as noted.  Right Carotid Findings: +----------+--------+--------+--------+------------------+------------------+           PSV cm/sEDV cm/sStenosisPlaque DescriptionComments           +----------+--------+--------+--------+------------------+------------------+ CCA Prox  73      16                                                   +----------+--------+--------+--------+------------------+------------------+ CCA Distal82      24                                intimal thickening +----------+--------+--------+--------+------------------+------------------+ ICA Prox  81      27      1-39%   heterogenous                         +----------+--------+--------+--------+------------------+------------------+ ICA Distal103     35                                                   +----------+--------+--------+--------+------------------+------------------+  ECA       69      7                                                    +----------+--------+--------+--------+------------------+------------------+ +----------+--------+-------+----------------+-------------------+           PSV cm/sEDV cmsDescribe        Arm Pressure (mmHG) +----------+--------+-------+----------------+-------------------+ [id number]            Multiphasic, WNL                    +----------+--------+-------+----------------+-------------------+ +---------+--------+--+--------+--+---------+ VertebralPSV cm/s91EDV cm/s24Antegrade +---------+--------+--+--------+--+---------+  Left Carotid Findings: +----------+--------+--------+--------+------------------+--------+           PSV cm/sEDV cm/sStenosisPlaque DescriptionComments +----------+--------+--------+--------+------------------+--------+ CCA Prox  74      17                                          +----------+--------+--------+--------+------------------+--------+ CCA Distal112     23                                         +----------+--------+--------+--------+------------------+--------+ ICA Prox  563     180     80-99%  calcific                   +----------+--------+--------+--------+------------------+--------+ ICA Mid   251     35                                         +----------+--------+--------+--------+------------------+--------+ ICA Distal128     47                                         +----------+--------+--------+--------+------------------+--------+ ECA       114                                                +----------+--------+--------+--------+------------------+--------+ +----------+--------+--------+----------------+-------------------+           PSV cm/sEDV cm/sDescribe        Arm Pressure (mmHG) +----------+--------+--------+----------------+-------------------+ Subclavian117             Multiphasic, WNL                    +----------+--------+--------+----------------+-------------------+ +---------+--------+--+--------+--+---------+ VertebralPSV cm/s50EDV cm/s16Antegrade +---------+--------+--+--------+--+---------+   Summary: Right Carotid: Velocities in the right ICA are consistent with a 1-39% stenosis. Left Carotid: Velocities in the left ICA are consistent with a 80-99% stenosis. Vertebrals:  Bilateral vertebral arteries demonstrate antegrade flow. Subclavians: Normal flow hemodynamics were seen in bilateral subclavian              arteries. *See table(s) above for measurements and observations.     Preliminary      Assessment and Plan:   Nonobstructive CAD Cardiac catheterization as above.  Recommended medical therapy. Continue aspirin, Plavix  and statin Not on beta-blocker due to baseline heart rate in 60s 11/28/2021: Cholesterol 85; HDL 31; LDL Cholesterol 40; Triglycerides 68; VLDL 14   2.  Surgical  clearance Patient has chronic dyspnea on exertion without chest tightness or pressure.  Underwent cardiac catheterization February 2022 showing mild nonobstructive disease.  She has 40 to 50% left main disease.  However symptoms are improved since cardiac catheterization.  Echocardiogram this admission showed preserved LV function without wall motion abnormality.  She is easily getting greater than 4 METS of activity. -No cardiac work-up recommended prior to her surgery  3.  Tobacco smoking  with COPD Recommended complete cessation No active wheezing  4.  CVA 5.  Left ICA stenosis Per primary team       For questions or updates, please contact [facility] Please consult [email or web address] for contact info under    Jarrett Soho, [place]  11/29/2021 9:24 AM    Patient seen and examined.  Agree with above documentation.  Michelle Nicholson is a 61 year old female with history of nonobstructive CAD, T2DM, hypertension, PAD, SIADH, COPD, tobacco use, CVA, hypothyroidism we are consulted to see for preop evaluation prior to carotid endarterectomy at the request of Dr. Virl Cagey.  She presented with worsening balance and MRI brain showed small acute CVA.  She was found to have severe left carotid stenosis and vascular surgery planning carotid endarterectomy.  [facility]  She has been having exertional dyspnea and underwent cardiac catheterization 08/2020, showed nonobstructive CAD.  Reports no change in symptoms since that time, continues to have shortness of breath but denies any chest pain.  EKG shows sinus rhythm, rate 56, right bundle branch block and left anterior fascicular block.  Echocardiogram 5/18 showed normal biventricular function, grade 1 diastolic dysfunction, no significant valvular disease.  On exam, patient is alert and oriented, regular rate and rhythm, no murmurs, lungs CTAB, no LE edema.  For preop evaluation, she had cardiac catheterization last year with  nonobstructive disease.  Good functional capacity, greater than 4 METS.  Can walk up flight of stairs without any exertional symptoms.  She reports her symptoms of dyspnea have improved since her cath last year.  No further work-up recommended prior to her surgery.  She will continue to follow-up with her cardiologist in [employer] [place], MD

## 2021-11-29 NOTE — Plan of Care (Signed)
  Problem: Education: Goal: Knowledge of disease or condition will improve Outcome: Adequate for Discharge Goal: Knowledge of secondary prevention will improve (SELECT ALL) Outcome: Adequate for Discharge Goal: Knowledge of patient specific risk factors will improve (INDIVIDUALIZE FOR PATIENT) Outcome: Adequate for Discharge   Problem: Coping: Goal: Will verbalize positive feelings about self Outcome: Adequate for Discharge   Problem: Self-Care: Goal: Ability to participate in self-care as condition permits will improve Outcome: Adequate for Discharge Goal: Verbalization of feelings and concerns over difficulty with self-care will improve Outcome: Adequate for Discharge   Problem: Nutrition: Goal: Dietary intake will improve Outcome: Adequate for Discharge   Problem: Education: Goal: Knowledge of General Education information will improve Description: Including pain rating scale, medication(s)/side effects and non-pharmacologic comfort measures Outcome: Adequate for Discharge   Problem: Health Behavior/Discharge Planning: Goal: Ability to manage health-related needs will improve Outcome: Adequate for Discharge   Problem: Clinical Measurements: Goal: Ability to maintain clinical measurements within normal limits will improve Outcome: Adequate for Discharge Goal: Will remain free from infection Outcome: Adequate for Discharge Goal: Diagnostic test results will improve Outcome: Adequate for Discharge Goal: Respiratory complications will improve Outcome: Adequate for Discharge Goal: Cardiovascular complication will be avoided Outcome: Adequate for Discharge   Problem: Activity: Goal: Risk for activity intolerance will decrease Outcome: Adequate for Discharge   Problem: Nutrition: Goal: Adequate nutrition will be maintained Outcome: Adequate for Discharge   Problem: Elimination: Goal: Will not experience complications related to bowel motility Outcome: Adequate for  Discharge Goal: Will not experience complications related to urinary retention Outcome: Adequate for Discharge   Problem: Pain Managment: Goal: General experience of comfort will improve Outcome: Adequate for Discharge   Problem: Safety: Goal: Ability to remain free from injury will improve Outcome: Adequate for Discharge   Problem: Skin Integrity: Goal: Risk for impaired skin integrity will decrease Outcome: Adequate for Discharge

## 2021-11-29 NOTE — TOC Transition Note (Signed)
Transition of Care [facility]) - CM/SW Discharge Note   Patient Details  Name: Michelle Nicholson MRN: [id number] Date of Birth: 03/04/1961  [facility] CM/SW Contact:  Pollie Friar, RN Phone Number: 11/29/2021, 12:49 PM   Clinical Narrative:    Patient is from home alone. States her daughter can check in on her.  Recommendations are for outpatient therapy. Patient asked to attend at [facility]. Information on the AVS. Pt to return on Monday for carotid procedure so will arrange therapy for after that stay.  Pt drives self and manages her own medications without issues.  Pt has transportation home.    Final next level of care: OP Rehab Barriers to Discharge: No Barriers Identified   Patient Goals and CMS Choice     Choice offered to / list presented to : Patient  Discharge Placement                       Discharge Plan and Services                                     Social Determinants of Health (SDOH) Interventions     Readmission Risk Interventions     View : No data to display.

## 2021-11-29 NOTE — Discharge Summary (Signed)
Physician Discharge Summary   Patient: Michelle Nicholson MRN: [id number] DOB: May 20, 1961  Admit date:     11/27/2021  Discharge date: 11/29/21  Discharge Physician: Edwin Dada   PCP: Jonathon Bellows, PA-C     Recommendations at discharge:  Follow up with Vascular Surgery Dr. Virl Cagey on Monday for LEFT CEA Follow up with [facility] Neurology for new stroke in 6-8 weeks Follow up with PCP Jossie Ng in 1-2 weeks Landry Mellow Podraza: Please obtain MRI brain with and without contrast in 3 months to re-evaluate abnormal enhancement in the anterior left frontal lobe (see below)     Discharge Diagnoses: Principal Problem:   Acute ischemic stroke [facility]) Active Problems:   Symptomatic Internal carotid artery stenosis, left   Chronic kidney disease stage IIIa   Acute kidney injury ruled out   Hypokalemia   Chronic hyponatremia   Uncontrolled type 2 diabetes mellitus with hyperglycemia, with long-term current use of insulin ([place])   Essential hypertension   Dyslipidemia   Tobacco abuse   Fatigue   Pyuria     Hospital Course: Michelle Nicholson is a 61 y.o. F with DM, HTN, PVD, retinal hemorrhage no longer on Plavix, hypothyroidism, chronic hyponatremia, smoking, and recurrent UTI who presented with several weeks to months of gait instability, "head fog", and severe fatigue.  Had gone to the outlying ER where CT head was normal, but in consultation with Neurology an MRI brain was recommended.  This MRI showed multifocal acute strokes in the left hemisphere, as well as some subacute appearing left hemispheric punctate infarcts.  CTA was obtained that showed 80-99% left carotid stenosis.   Acute ischemic stroke due to embolism from the left carotid artery MRI brain showed multifocal punctate left hemispheric infarcts.  Non-invasive angiography showed advanced atherosclerosis in the neck and the skull base, follow-up carotid ultrasound showed 80 to 99% left carotid stenosis.  Echocardiogram  showed no source of embolism.  Lipids ordered, discharged on high intensity statin.  Atrial fibrillation not present on monitoring, tPA not given due to the window.  Dysphagia screen ordered in the ER, PT recommended outpatient physical therapy, smoking cessation recommended.  Vascular surgery were consulted and recommended short interval follow-up for carotid endarterectomy.  Plans in place for this to happen next Monday.  Discharged back on aspirin 325 and Plavix.         Chronic kidney disease stage IIIa Acute kidney injury ruled out.  Baseline Cr is 1.1-1.2, and current Cr is essentially stable.  Pyuria Urine culture recently with Pseudomonas, had been treated with Cipro for 2 doses prior to admission.  Urine culture here with no growth.  No more symptoms.   Abnormal MRI brain MRI brain showed multifocal punctate infarcts, some with a more acute appearance, some more subacute.  There was one left frontal are of enhancement that was atypical in appearance, perhaps subacute infarct, but for which Radiology recommended repeat MRI brain with and without contrast in 3 months.         [facility] Controlled Substances Registry was reviewed for this patient prior to discharge.  Consultants: Neurology, vascular surgery Procedures performed: CT angiogram of the head and neck, echocardiogram, carotid ultrasound, MRI brain Disposition: Home Diet recommendation:  Discharge Diet Orders (From admission, onward)     Start     Ordered   11/29/21 0000  Diet - low sodium heart healthy        11/29/21 1205  DISCHARGE MEDICATION: Allergies as of 11/29/2021       Reactions   Dorzolamide Hcl-timolol Mal Itching, Other (See Comments)   Makes her eyes burn Conjunctival Injection with Burning Visual Acuity Blurry   Brimonidine Rash, Other (See Comments)   Follicular conjunctivitis   Netarsudil-latanoprost Other (See Comments)   Redness, tearing, burning         Medication List     STOP taking these medications    aspirin EC 81 MG tablet Replaced by: aspirin 325 MG tablet   sulfamethoxazole-trimethoprim 800-160 MG tablet Commonly known as: BACTRIM DS       TAKE these medications    acetaminophen 500 MG tablet Commonly known as: TYLENOL Take 1,000 mg by mouth daily as needed for headache (pain).   amLODipine 5 MG tablet Commonly known as: NORVASC Take 5 mg by mouth every morning.   amLODipine 2.5 MG tablet Commonly known as: NORVASC Take 2.5 mg by mouth at bedtime.   aspirin 325 MG tablet Take 1 tablet (325 mg total) by mouth daily. Start taking on: Nov 30, 2021 Replaces: aspirin EC 81 MG tablet   atorvastatin 40 MG tablet Commonly known as: LIPITOR Take 40 mg by mouth every morning.   buPROPion 150 MG 24 hr tablet Commonly known as: WELLBUTRIN XL Take 150 mg by mouth every morning.   CAL-MAG-ZINC PO Take 1 tablet by mouth every morning.   ciprofloxacin 500 MG tablet Commonly known as: CIPRO Take 500 mg by mouth 2 (two) times daily.   clopidogrel 75 MG tablet Commonly known as: PLAVIX Take 1 tablet (75 mg total) by mouth daily. Start taking on: Nov 30, 2021   estradiol 0.1 MG/GM vaginal cream Commonly known as: ESTRACE Place 1 Applicatorful vaginally at bedtime.   FeroSul 325 (65 FE) MG tablet Generic drug: ferrous sulfate Take 1 tablet (325 mg total) by mouth 2 (two) times daily with a meal. What changed: when to take this   fluorouracil 5 % cream Commonly known as: EFUDEX Apply 1 application. topically daily as needed (skin cancer breakouts).   fluticasone-salmeterol 100-50 MCG/ACT Aepb Commonly known as: ADVAIR Inhale 1 puff into the lungs daily as needed (shortness of breath).   folic acid 1 MG tablet Commonly known as: FOLVITE Take 1 mg by mouth every morning.   insulin aspart 100 UNIT/ML FlexPen Commonly known as: NOVOLOG Inject 4 Units into the skin See admin instructions. Inject 4  units subcutaneously three times daily after meals - plus sliding scale adjustment - add 1 unit for every 50 units over 200   insulin degludec 100 UNIT/ML FlexTouch Pen Commonly known as: Tyler Aas FlexTouch Inject 7 Units into the skin daily. What changed:  how much to take when to take this   ipratropium-albuterol 0.5-2.5 (3) MG/3ML Soln Commonly known as: DUONEB Take 3 mLs by nebulization every 6 (six) hours as needed for up to 15 days.   latanoprost 0.005 % ophthalmic solution Commonly known as: XALATAN Place 1 drop into both eyes at bedtime.   levothyroxine 50 MCG tablet Commonly known as: SYNTHROID Take 50 mcg by mouth daily before breakfast.   losartan 50 MG tablet Commonly known as: COZAAR Take 50 mg by mouth every morning.   methazolamide 50 MG tablet Commonly known as: NEPTAZANE Take 50 mg by mouth 2 (two) times daily.   multivitamin with minerals Tabs tablet Take 1 tablet by mouth every morning.   oxybutynin 10 MG 24 hr tablet Commonly known as: DITROPAN-XL Take 10 mg by  mouth every morning.   Pancrelipase (Lip-Prot-Amyl) 24000-76000 units Cpep Take 2 capsules by mouth daily after breakfast.   pregabalin 300 MG capsule Commonly known as: LYRICA Take 300 mg by mouth 2 (two) times daily.   sertraline 100 MG tablet Commonly known as: ZOLOFT Take 100 mg by mouth every morning.   sodium chloride 1 g tablet Take 1 g by mouth 3 (three) times daily as needed (brain fog from sodium insufficiency).   trimethoprim 100 MG tablet Commonly known as: TRIMPEX Take 100 mg by mouth every morning.   VITAMIN B-12 PO Take 1 tablet by mouth every morning.   Vitamin D (Ergocalciferol) 1.25 MG (50000 UNIT) Caps capsule Commonly known as: DRISDOL Take 50,000 Units by mouth every Sunday.   VITAMIN E PO Take 1 capsule by mouth every morning.        Follow-up Information     Broadus John, MD Follow up.   Specialty: Vascular Surgery Contact information: [place] [address] [phone]         Outpatient Rehabilitation [facility]. Schedule an appointment as soon as possible for a visit in 1 week(s).   Specialty: Rehabilitation Contact information: [address]  [place] [id number] [place] [phone]                Discharge Instructions     Ambulatory referral to Neurology   Complete by: As directed    An appointment is requested in approximately: 8 weeks For stroke follow up   Ambulatory referral to Physical Therapy   Complete by: As directed    Diet - low sodium heart healthy   Complete by: As directed    Discharge instructions   Complete by: As directed    From Dr. Loleta Books: You were admitted for stroke You had an MRI that showed several small strokes on the left side of the brain. You had a CT "angiogram" (image of the arteries of the head and neck) that showed blockage in the large artery of the neck on the left, which was likely the culprit  Take aspirin but increase to 325 mg daily Start the medicine clopidogrel/Plavix 75 mg daily (this is a "super aspirin") Continue your atorvastatin  Dr. Virl Cagey office will call you today, likely to arrange surgery for Monday Take the aspirin, Plavix and statin each day  Make sure after the surgery, you ask Dr. Virl Cagey how long you need to take the Plavix and get refills if needed  I have sent a referral to a Neurologist and they will contact you for an appoitnment in 2-3 months   Regarding your recent UTI Here, your urine culture had no growth. This may mean you completely treated the UTI you had before, and don't need any more treatment. Given that information, and knowing that ciprofloxacin is not without side effects, I defer to your judgment whether to finish the course of ciprofloxacin or not.  I would caution you that most times a course of Cipro is only 3 days (not 7 days)   You may also discuss with  your Urologist.   Increase activity slowly   Complete by: As directed        Discharge Exam: Filed Weights   11/27/21 2106 11/28/21 1510 11/29/21 0559  Weight: 68 kg 68 kg 70.2 kg    General: Pt is alert, awake, not in acute distress sitting up eating breakfast Cardiovascular: RRR, nl S1-S2, no murmurs appreciated.  No LE edema.   Respiratory: Normal respiratory rate and rhythm.  CTAB without rales or wheezes. Abdominal: Abdomen soft and non-tender.  No distension or HSM.   Neuro/Psych: Strength symmetric in upper and lower extremities.  Judgment and insight appear normal.   Condition at discharge: good  The results of significant diagnostics from this hospitalization (including imaging, microbiology, ancillary and laboratory) are listed below for reference.   Imaging Studies: CT ANGIO HEAD NECK W WO CM  Result Date: 11/28/2021 CLINICAL DATA:  61 year old female with neurologic deficit. Brain MRI this morning reveals small acute and subacute cortical and white matter infarcts in the posterior left MCA territory. EXAM: CT ANGIOGRAPHY HEAD AND NECK TECHNIQUE: Multidetector CT imaging of the head and neck was performed using the standard protocol during bolus administration of intravenous contrast. Multiplanar CT image reconstructions and MIPs were obtained to evaluate the vascular anatomy. Carotid stenosis measurements (when applicable) are obtained utilizing NASCET criteria, using the distal internal carotid diameter as the denominator. RADIATION DOSE REDUCTION: This exam was performed according to the departmental dose-optimization program which includes automated exposure control, adjustment of the mA and/or kV according to patient size and/or use of iterative reconstruction technique. CONTRAST:  10m OMNIPAQUE IOHEXOL 350 MG/ML SOLN COMPARISON:  Brain MRI 0434 hours today. FINDINGS: CTA NECK Skeleton: Left TMJ degeneration. Cervical spine degeneration including fairly advanced facet  arthropathy on the right C4-C5. No acute osseous abnormality identified. Upper chest: Mild paraseptal emphysema or scarring in the left upper lung. No superior mediastinal lymphadenopathy. Other neck: No acute finding. Aortic arch: Calcified aortic atherosclerosis. Extensive great vessel origin calcified atherosclerosis. Three vessel arch configuration. Right carotid system: Brachiocephalic artery plaque without stenosis. Mildly tortuous right CCA. Moderate calcified plaque at the right ICA origin and bulb but less than 50 % stenosis with respect to the distal vessel. Left carotid system: Calcified left CCA origin plaque without stenosis. Tortuous proximal left CCA with combined soft and calcified plaque in the vessel at the level of the larynx, less than 50 % stenosis with respect to the distal vessel. Pronounced low-density soft plaque there series 5, image 120. Bulky calcified plaque at the left ICA origin and bulb with high-grade stenosis essentially a radiographic string sign at the origin (series 9, image 77 and series 7, image 206. But the vessel remains patent. Additional bulb calcified plaque without stenosis. Vertebral arteries: Proximal right subclavian artery calcified plaque with only mild stenosis. Mild soft and calcified plaque at the right vertebral artery origin with only mild stenosis. Dominant right vertebral artery otherwise normal to the skull base. Moderate proximal left subclavian artery plaque with 50% stenosis. Non dominant left vertebral artery with mild to moderate soft plaque stenosis at its origin on series 8, image 154. Left vertebral remains diminutive throughout the neck but patent to the skull base. CTA HEAD Posterior circulation: Right V4 segment is dominant and primarily supplies the basilar. Right V4 calcified plaque but only mild distal right vertebral stenosis. Diminutive left vertebral artery terminates in PICA. Patent right PICA origin. Patent basilar artery with mild  tortuosity. Patent SCA and right PCA origins. Fetal type left PCA origin. Right posterior communicating artery diminutive or absent. Bilateral PCA branches are within normal limits. Anterior circulation: Both ICA siphons are patent but heavily calcified. On the left there is moderate to severe stenosis at the left anterior genu on series 7, image 112. Normal left posterior communicating artery origin. On the right there is moderate stenosis throughout the cavernous segment, mild supraclinoid  stenosis. Patent carotid termini, MCA and ACA origins. Left A1 segment appears mildly dominant. Normal anterior communicating artery and bilateral ACA branches. Left MCA M1 segment and bifurcation are patent without stenosis. Right MCA M1 segment and bifurcation are patent without stenosis. Bilateral MCA branches are within normal limits. Venous sinuses: Patent. Anatomic variants: Dominant right vertebral artery. The left is diminutive and terminates in PICA. Fetal type left PCA origin. Review of the MIP images confirms the above findings IMPRESSION: 1. Negative for large vessel occlusion. 2. Positive for advanced atherosclerosis in the neck and at the skull base: - soft and calcified Left Carotid atherosclerosis in the neck with bulky plaque at the Left ICA origin and high-grade RADIOGRAPHIC STRING SIGN stenosis. - heavily calcified ICA siphons. Moderate to Severe Left anterior genu and moderate Right cavernous ICA siphon stenoses. - Only mild vertebral artery stenosis. 3. Aortic Atherosclerosis (ICD10-I70.0). Electronically Signed   By: Genevie Ann M.D.   On: 11/28/2021 07:32   CT Head Wo Contrast  Result Date: 11/27/2021 CLINICAL DATA:  Stroke-like symptoms EXAM: CT HEAD WITHOUT CONTRAST TECHNIQUE: Contiguous axial images were obtained from the base of the skull through the vertex without intravenous contrast. RADIATION DOSE REDUCTION: This exam was performed according to the departmental dose-optimization program which  includes automated exposure control, adjustment of the mA and/or kV according to patient size and/or use of iterative reconstruction technique. COMPARISON:  06/02/2021 FINDINGS: Brain: No evidence of acute infarction, hemorrhage, hydrocephalus, extra-axial collection or mass lesion/mass effect. Vascular: No hyperdense vessel or unexpected calcification. Skull: Normal. Negative for fracture or focal lesion. Sinuses/Orbits: No acute finding. Other: None. IMPRESSION: No acute intracranial abnormality noted. Electronically Signed   By: Inez Catalina M.D.   On: 11/27/2021 21:33   MR Brain W and Wo Contrast  Result Date: 11/28/2021 CLINICAL DATA:  61 year old female with stroke-like symptoms. Loss of equilibrium. History of stroke. EXAM: MRI HEAD WITHOUT AND WITH CONTRAST TECHNIQUE: Multiplanar, multiecho pulse sequences of the brain and surrounding structures were obtained without and with intravenous contrast. CONTRAST:  24m GADAVIST GADOBUTROL 1 MMOL/ML IV SOLN COMPARISON:  Brain MRI 12/09/2015.  Head CT 11/27/2021. FINDINGS: Brain: Normal cerebral volume. Small cortically based focus of restricted diffusion in the left anterior motor strip superiorly (series 2, image 46) near the right upper extremity representation area is accompanied by mild T2 and FLAIR hyperintensity (series 6, image 33). No abnormal enhancement. More subtle and possibly acute or subacute nearby posterior left frontal white matter abnormal diffusion (series 2, image 42 and series 3 image 19) in the posterior left corona radiata. Some associated T2 and FLAIR hyperintensity. No other restricted diffusion. No midline shift, mass effect, evidence of mass lesion, ventriculomegaly, extra-axial collection or acute intracranial hemorrhage. Cervicomedullary junction and pituitary are within normal limits. Chronic heterogeneous T2 and FLAIR hyperintensity throughout the pons (series 5, image 10) appears mildly progressed since 2017. No superimposed  cortical encephalomalacia or chronic cerebral blood products. Outside of the abnormal diffusion there are scattered small T2 and FLAIR hyperintense areas in the bilateral cerebral white matter which have increased since 2017, mild to moderate for age. Cerebellum and deep gray nuclei appear to remain normal. There is a punctate focus of abnormal enhancement in the anterior left frontal lobe on series 10, image 31 and series 11, image 28. No associated signal abnormality here on the remaining sequences. This is new since 2017. No other abnormal enhancement identified. No dural thickening Vascular: Major intracranial vascular flow voids are stable since 2017. The  distal right vertebral artery appears dominant and the left diminutive possibly terminating in PICA. Pneumatized left anterior clinoid process redemonstrated. The major dural venous sinuses are enhancing and appear to be patent. Skull and upper cervical spine: Stable and negative. Visualized bone marrow signal is within normal limits. Sinuses/Orbits: Stable and negative. Other: Mild right mastoid effusion is chronic, mildly increased since 2017. Left mastoids remain clear. Other Visible internal auditory structures appear normal. Negative visible scalp and face. IMPRESSION: 1. Small acute cortically based infarct in the left anterior motor strip near the right upper extremity representation area. And nearby more subacute appearing white matter small vessel ischemia in the posterior left corona radiata. No associated hemorrhage or mass effect. 2. Positive also for indeterminate punctate abnormal enhancement in the anterior left frontal lobe (series 10, image 31), new since 2017. In light of #1, perhaps this is enhancement of a late subacute small vessel infarct. But a repeat Brain MRI without and with contrast is recommended in 3 months to re-evaluate. 3. Chronically advanced signal changes in the pons, nonspecific but favor small vessel disease related in this  setting. Mild to moderate for age cerebral white matter signal changes have also progressed since 2017. Electronically Signed   By: Genevie Ann M.D.   On: 11/28/2021 05:42   ECHOCARDIOGRAM COMPLETE  Result Date: 11/28/2021    ECHOCARDIOGRAM REPORT   Patient Name:   JAMARIYAH JOHANNSEN Date of Exam: 11/28/2021 Medical Rec #:  [id number]  Height:       69.0 in Accession #:    [id number] Weight:       150.0 lb Date of Birth:  09/24/1960  BSA:          1.828 m Patient Age:    26 years   BP:           157/60 mmHg Patient Gender: F          HR:           53 bpm. Exam Location:  Inpatient Procedure: 2D Echo, Cardiac Doppler and Color Doppler Indications:    Stroke  History:        Patient has prior history of Echocardiogram examinations, most                 recent 04/21/2021. Risk Factors:Hypertension and Diabetes.  Sonographer:    Jefferey Pica Referring Phys: [id number] ASHISH ARORA IMPRESSIONS  1. Left ventricular ejection fraction, by estimation, is 60 to 65%. The left ventricle has normal function. The left ventricle has no regional wall motion abnormalities. Left ventricular diastolic parameters are consistent with Grade I diastolic dysfunction (impaired relaxation).  2. Right ventricular systolic function is normal. The right ventricular size is normal.  3. The mitral valve is normal in structure. Trivial mitral valve regurgitation. No evidence of mitral stenosis.  4. The aortic valve is tricuspid. There is mild calcification of the aortic valve. There is mild thickening of the aortic valve. Aortic valve regurgitation is not visualized. Aortic valve sclerosis is present, with no evidence of aortic valve stenosis.  5. Aortic dilatation noted. There is mild dilatation of the aortic root, measuring 38 mm.  6. The inferior vena cava is normal in size with greater than 50% respiratory variability, suggesting right atrial pressure of 3 mmHg. FINDINGS  Left Ventricle: Left ventricular ejection fraction, by estimation, is 60 to 65%.  The left ventricle has normal function. The left ventricle has no regional wall motion abnormalities. The left ventricular internal cavity size was normal  in size. There is  no left ventricular hypertrophy. Left ventricular diastolic parameters are consistent with Grade I diastolic dysfunction (impaired relaxation). Right Ventricle: The right ventricular size is normal. No increase in right ventricular wall thickness. Right ventricular systolic function is normal. Left Atrium: Left atrial size was normal in size. Right Atrium: Right atrial size was normal in size. Pericardium: There is no evidence of pericardial effusion. Mitral Valve: The mitral valve is normal in structure. Trivial mitral valve regurgitation. No evidence of mitral valve stenosis. Tricuspid Valve: The tricuspid valve is normal in structure. Tricuspid valve regurgitation is trivial. No evidence of tricuspid stenosis. Aortic Valve: The aortic valve is tricuspid. There is mild calcification of the aortic valve. There is mild thickening of the aortic valve. Aortic valve regurgitation is not visualized. Aortic valve sclerosis is present, with no evidence of aortic valve stenosis. Aortic valve peak gradient measures 4.6 mmHg. Pulmonic Valve: The pulmonic valve was normal in structure. Pulmonic valve regurgitation is not visualized. No evidence of pulmonic stenosis. Aorta: Aortic dilatation noted. There is mild dilatation of the aortic root, measuring 38 mm. Venous: The inferior vena cava is normal in size with greater than 50% respiratory variability, suggesting right atrial pressure of 3 mmHg. IAS/Shunts: No atrial level shunt detected by color flow Doppler.  LEFT VENTRICLE PLAX 2D LVIDd:         4.60 cm   Diastology LVIDs:         2.70 cm   LV e' medial:    4.00 cm/s LV PW:         0.90 cm   LV E/e' medial:  13.2 LV IVS:        1.00 cm   LV e' lateral:   6.89 cm/s LVOT diam:     2.10 cm   LV E/e' lateral: 7.6 LV SV:         93 LV SV Index:   51 LVOT  Area:     3.46 cm  RIGHT VENTRICLE            IVC RV S prime:     8.95 cm/s  IVC diam: 1.70 cm TAPSE (M-mode): 2.7 cm LEFT ATRIUM             Index        RIGHT ATRIUM           Index LA diam:        3.70 cm 2.02 cm/m   RA Area:     16.60 cm LA Vol (A2C):   47.0 ml 25.71 ml/m  RA Volume:   41.40 ml  22.64 ml/m LA Vol (A4C):   38.0 ml 20.79 ml/m LA Biplane Vol: 42.6 ml 23.30 ml/m  AORTIC VALVE                 PULMONIC VALVE AV Area (Vmax): 3.38 cm     PV Vmax:       0.60 m/s AV Vmax:        106.70 cm/s  PV Peak grad:  1.4 mmHg AV Peak Grad:   4.6 mmHg LVOT Vmax:      104.00 cm/s LVOT Vmean:     64.400 cm/s LVOT VTI:       0.269 m  AORTA Ao Root diam: 3.80 cm Ao Asc diam:  3.70 cm MITRAL VALVE MV Area (PHT): 1.56 cm    SHUNTS MV Decel Time: 487 msec    Systemic VTI:  0.27 m MV E velocity: 52.70  cm/s  Systemic Diam: 2.10 cm MV A velocity: 87.00 cm/s MV E/A ratio:  0.61 Jenkins Rouge MD Electronically signed by Jenkins Rouge MD Signature Date/Time: 11/28/2021/11:56:49 AM    Final    VAS US CAROTID  Result Date: 11/28/2021 Carotid Arterial Duplex Study Patient Name:  DIMITRA WOODSTOCK  Date of Exam:   11/28/2021 Medical Rec #: [id number]   Accession #:    [id number] Date of Birth: [birth year]   Patient Gender: F Patient Age:   39 years Exam Location:  [facility] Procedure:      VAS US CAROTID Referring Phys: Vonna Kotyk ROBINS --------------------------------------------------------------------------------  Indications:       Carotid artery disease. Comparison Study:  No prior carotid studies. CTA neck on 11/28/21 showed less                    than 50% stenosis on the right side, left ICA high grade                    radiographic string sign stenosis at the origin. Performing Technologist: Oda Cogan RDMS, RVT  Examination Guidelines: A complete evaluation includes B-mode imaging, spectral Doppler, color Doppler, and power Doppler as needed of all accessible portions of each vessel. Bilateral testing is  considered an integral part of a complete examination. Limited examinations for reoccurring indications may be performed as noted.  Right Carotid Findings: +----------+--------+--------+--------+------------------+------------------+           PSV cm/sEDV cm/sStenosisPlaque DescriptionComments           +----------+--------+--------+--------+------------------+------------------+ CCA Prox  73      16                                                   +----------+--------+--------+--------+------------------+------------------+ CCA Distal82      24                                intimal thickening +----------+--------+--------+--------+------------------+------------------+ ICA Prox  81      27      1-39%   heterogenous                         +----------+--------+--------+--------+------------------+------------------+ ICA Distal103     35                                                   +----------+--------+--------+--------+------------------+------------------+ ECA       69      7                                                    +----------+--------+--------+--------+------------------+------------------+ +----------+--------+-------+----------------+-------------------+           PSV cm/sEDV cmsDescribe        Arm Pressure (mmHG) +----------+--------+-------+----------------+-------------------+ [id number]            Multiphasic, WNL                    +----------+--------+-------+----------------+-------------------+ +---------+--------+--+--------+--+---------+  VertebralPSV cm/s91EDV cm/s24Antegrade +---------+--------+--+--------+--+---------+  Left Carotid Findings: +----------+--------+--------+--------+------------------+--------+           PSV cm/sEDV cm/sStenosisPlaque DescriptionComments +----------+--------+--------+--------+------------------+--------+ CCA Prox  74      17                                          +----------+--------+--------+--------+------------------+--------+ CCA Distal112     23                                         +----------+--------+--------+--------+------------------+--------+ ICA Prox  563     180     80-99%  calcific                   +----------+--------+--------+--------+------------------+--------+ ICA Mid   251     35                                         +----------+--------+--------+--------+------------------+--------+ ICA Distal128     47                                         +----------+--------+--------+--------+------------------+--------+ ECA       114                                                +----------+--------+--------+--------+------------------+--------+ +----------+--------+--------+----------------+-------------------+           PSV cm/sEDV cm/sDescribe        Arm Pressure (mmHG) +----------+--------+--------+----------------+-------------------+ Subclavian117             Multiphasic, WNL                    +----------+--------+--------+----------------+-------------------+ +---------+--------+--+--------+--+---------+ VertebralPSV cm/s50EDV cm/s16Antegrade +---------+--------+--+--------+--+---------+   Summary: Right Carotid: Velocities in the right ICA are consistent with a 1-39% stenosis. Left Carotid: Velocities in the left ICA are consistent with a 80-99% stenosis. Vertebrals:  Bilateral vertebral arteries demonstrate antegrade flow. Subclavians: Normal flow hemodynamics were seen in bilateral subclavian              arteries. *See table(s) above for measurements and observations.     Preliminary     Microbiology: Results for orders placed or performed during the hospital encounter of 11/27/21  Urine Culture     Status: None   Collection Time: 11/28/21  7:59 AM   Specimen: Urine, Clean Catch  Result Value Ref Range Status   Specimen Description URINE, CLEAN CATCH  Final   Special Requests NONE   Final   Culture   Final    NO GROWTH Performed at [facility], [facility] [address] [place], [place] [address]    Report Status 11/29/2021 FINAL  Final    Labs: CBC: Recent Labs  Lab 11/27/21 2121  WBC 8.0  NEUTROABS 4.5  HGB 9.9*  HCT 29.4*  MCV 90.7  PLT 119   Basic Metabolic Panel: Recent Labs  Lab 11/27/21 2121  NA 130*  K 3.3*  CL 108  CO2 16*  GLUCOSE 149*  BUN 16  CREATININE 1.31*  CALCIUM 8.2*   Liver Function Tests: Recent Labs  Lab 11/27/21 2121  AST 18  ALT 12  ALKPHOS 84  BILITOT 0.5  PROT 6.4*  ALBUMIN 3.5   CBG: Recent Labs  Lab 11/28/21 1224 11/28/21 1611 11/28/21 2105 11/29/21 0641 11/29/21 1234  GLUCAP 256* 111* 115* 107* 203*    Discharge time spent: approximately 40 minutes spent on discharge counseling, evaluation of patient on day of discharge, and coordination of discharge planning with nursing, social work, pharmacy and case management  Signed: Edwin Dada, MD [facility] 11/29/2021

## 2021-11-29 NOTE — Progress Notes (Signed)
Patient seen and examined this morning No complaints No new deficits, dizziness subjectively improved  Patient complained of some speech slurring last night, but says this happens when she is tired. No changes in neuro exam this morning  After our conversation yesterday [name] spoke to her daughter regarding her options.  After discussing the risk and benefits of both carotid endarterectomy and transcarotid artery revascularization, Michelle Nicholson chose to pursue carotid endarterectomy.  I would like Michelle Nicholson to see cardiology prior to discharge for operative restratification as she has labored breathing with prolonged conversation.  She could not climb a flight of stairs without significant shortness of breath.    Plan will be for carotid endarterectomy early next week pending cardiac clearance.  This can be scheduled as an outpatient should she be discharged today.  Please call me prior to discharge to ensure surgery date.    Broadus John MD

## 2021-11-29 NOTE — Progress Notes (Addendum)
Mrs. Turk denies chest pain or shortness of breath. Patient denies having any s/s of Covid in her household.  Patient denies any known exposure to Covid.  Mrs Perdew's PCP is Sindy Messing,  Cardiologist is with [employer]. Endocrinologist is Manley Mason, [place].  Mrs Lehnert was discharged from [facility] today, after being admitted with acute ischemic stroke. Mrs, Tino was sen by Dr. Shea Evans, cardiologist and cleared patient for surgery.  Mrs Sanks has type II diabetes, pancreas is not making insulin, which is type I diabetes, patient said that her endocrinologist has not called it type I.  I instructed patient to not take any medications for diabetes on Monday am; unless CBG is greater than 220, take 1/2 of sliding scale Novolog.  I instructed patient to check CBG after awaking and every 2 hours until arrival  to the hospital.  I Instructed patient if CBG is less than 70 to take 4 Glucose Tablets or 1 tube of Glucose Gel or 1/2 cup of a clear juice. Recheck CBG in 15 minutes if CBG is not over 70 call, pre- op desk at [phone] for further instructions. If scheduled to receive Insulin, do not take Insulin.   Ms is on Plavix, she was to receive a call from Dr. Virl Cagey regarding continuing Plavix as per discharge notes.

## 2021-11-29 NOTE — Progress Notes (Signed)
Explained discharge instructions to patient. Reviewed follow up appointment and next medication administration times. Patient verbalized having an understanding of instructions. Spoke with Md to confirm what meds to take the day of surgery. PIV was removed by NT. Telemetry was removed and CCMD was notified. All belongings are in the patient's possession. No additional needs noted. NT is transporting the patient down for discharge.

## 2021-11-29 NOTE — Progress Notes (Signed)
STROKE TEAM PROGRESS NOTE   INTERVAL HISTORY No family at bedside.  Patient sitting in chair, awake alert, neuro intact.  Dr. Unk Lightning from vascular surgery has seen her and decided left carotid revascularization next week.   Vitals:   11/29/21 0311 11/29/21 0559 11/29/21 0747 11/29/21 1200  BP: 125/61  114/62 136/67  Pulse: 62  60 60  Resp: '17  17 15  '$ Temp: 97.7 F (36.5 C)  97.7 F (36.5 C) 98 F (36.7 C)  TempSrc: Oral  Oral Oral  SpO2: 94%  96% 98%  Weight:  70.2 kg    Height:       CBC:  Recent Labs  Lab 11/27/21 2121  WBC 8.0  NEUTROABS 4.5  HGB 9.9*  HCT 29.4*  MCV 90.7  PLT 834   Basic Metabolic Panel:  Recent Labs  Lab 11/27/21 2121  NA 130*  K 3.3*  CL 108  CO2 16*  GLUCOSE 149*  BUN 16  CREATININE 1.31*  CALCIUM 8.2*   Lipid Panel:  Recent Labs  Lab 11/28/21 0646  CHOL 85  TRIG 68  HDL 31*  CHOLHDL 2.7  VLDL 14  LDLCALC 40   HgbA1c:  Recent Labs  Lab 11/28/21 0646  HGBA1C 7.8*   Urine Drug Screen:  Recent Labs  Lab 11/28/21 0855  LABOPIA NONE DETECTED  COCAINSCRNUR NONE DETECTED  LABBENZ NONE DETECTED  AMPHETMU NONE DETECTED  THCU NONE DETECTED  LABBARB NONE DETECTED    Alcohol Level No results for input(s): ETH in the last 168 hours.  IMAGING past 24 hours No results found.  PHYSICAL EXAM General:  Alert, well-nourished, well-developed elderly patient in no acute distress Respiratory:  Regular, unlabored respirations on room air  NEURO:  Mental Status: AA&Ox3  Speech/Language: speech is without dysarthria or aphasia.  Repetition, fluency, and comprehension intact.  Cranial Nerves:  II: PERRL. Visual fields full.  III, IV, VI: EOMI. Eyelids elevate symmetrically.  V: Sensation is intact to light touch but slightly diminished on the left   VII: Smile is symmetrical.  VIII: hearing intact to voice. IX, X: Phonation is normal.  XII: tongue is midline without fasciculations. Motor: 5/5 strength to all muscle groups  tested.  Tone: is normal and bulk is normal Sensation- Intact to light touch bilaterally but slightly diminished in left leg Coordination: FTN intact bilaterally.  No drift.  Gait- deferred   ASSESSMENT/PLAN [name] is a 61 y.o. female with history of Meniere's disease, stroke, DM, HTN, neuropathy and PVD presenting with  disequilibrium which interferes with walking and has lasted about two months.  It acutely worsened over the weekend, and she presented to urgent care and was instructed to come to the ED.  MRI revealed punctate ischemic infarcts in the left hemisphere which do not explain her disequilibrium.  CTA of head and neck revealed severe stenosis with radiographic string sign in the left ICA.  Vascular surgery will be consulted to address this.  Stroke:  left frontal punctate infarcts  likely secondary to severe left ICA bulb stenosis CT head No acute abnormality.  CTA head & neck severe left carotid atherosclerosis with radiographic string sign, heavily calcified bilateral ICA siphons, mild vertebral artery stenosis MRI  two small infarcts in left cerebral hemisphere, in left anterior motor strip and anterior left frontal lobe, small vessel disease Carotid Doppler left ICA 80-99% stenosis 2D Echo EF 19-62%, grade 1 diastolic dysfunction, no atrial level shunt LDL 40 HgbA1c 7.8 VTE prophylaxis - lovenox aspirin  81 mg daily prior to admission, now on aspirin 325 mg daily and clopidogrel 75 mg daily.  Further DAPT regimen per [facility]. Therapy recommendations:  outpatient PT Disposition:  home today  Severe left ICA stenosis CTA head and neck reveals radiographic string sign in left ICA bulb Carotid ultrasound left ICA 80 to 99% stenosis Dr. Virl Cagey vascular surgery will perform carotid revascularization next week. Continue DAPT and further regimen per Dr. Virl Cagey  Hypertension Home meds:  losartan 50 mg daily, norvasc 5 mg qAM and 2.5 mg qHS,  Stable Avoid low BP Long-term BP  goal normotensive  Hyperlipidemia Home meds:  atorvastatin 40 mg daily LDL 40, goal < 70 Resume home lipitor 40 Continue statin at discharge  Diabetes type II Uncontrolled Home meds:  Tresiba 16 units daily, insulin aspart 4 units TID after meals HgbA1c 7.8, goal < 7.0 CBGs Diabetes coordinator consult [facility] PCP follow-up for better DM control  Tobacco abuse Current smoker Smoking cessation counseling provided Nicotine patch provided Pt is willing to quit  Other Stroke Risk Factors Hx of acute hearing loss in 04/2016 Chronic gait imbalance   Other Active Problems UA WBC > 50, on rocephin x 2  Hospital day # 1  Neurology will sign off. Please call with questions. Pt will follow up with stroke clinic NP at [facility] in about 4 weeks. Thanks for the consult.   Rosalin Hawking, MD PhD Stroke Neurology 11/29/2021 4:18 PM      To contact Stroke Continuity provider, please refer to [email or web address] After hours, contact General Neurology

## 2021-12-02 ENCOUNTER — Inpatient Hospital Stay (HOSPITAL_COMMUNITY): Payer: BC Managed Care – PPO | Admitting: Certified Registered Nurse Anesthetist

## 2021-12-02 ENCOUNTER — Other Ambulatory Visit: Payer: Self-pay

## 2021-12-02 ENCOUNTER — Encounter (HOSPITAL_COMMUNITY)
Admission: RE | Disposition: A | Discharge status: Home / Self Care | Payer: Self-pay | Source: Home / Self Care | Attending: Vascular Surgery

## 2021-12-02 ENCOUNTER — Inpatient Hospital Stay (HOSPITAL_COMMUNITY)
Admission: RE | Admit: 2021-12-02 | Discharge: 2021-12-03 | Disposition: A | Discharge status: Home / Self Care | Payer: BC Managed Care – PPO | Source: Home / Self Care | Attending: Vascular Surgery | Admitting: Vascular Surgery

## 2021-12-02 ENCOUNTER — Encounter (HOSPITAL_COMMUNITY): Payer: Self-pay | Admitting: Vascular Surgery

## 2021-12-02 DIAGNOSIS — E039 Hypothyroidism, unspecified: Secondary | ICD-10-CM | POA: Diagnosis present

## 2021-12-02 DIAGNOSIS — M1990 Unspecified osteoarthritis, unspecified site: Secondary | ICD-10-CM | POA: Diagnosis present

## 2021-12-02 DIAGNOSIS — I6522 Occlusion and stenosis of left carotid artery: Secondary | ICD-10-CM | POA: Diagnosis present

## 2021-12-02 DIAGNOSIS — Z833 Family history of diabetes mellitus: Secondary | ICD-10-CM

## 2021-12-02 DIAGNOSIS — I6529 Occlusion and stenosis of unspecified carotid artery: Principal | ICD-10-CM | POA: Diagnosis present

## 2021-12-02 DIAGNOSIS — I69398 Other sequelae of cerebral infarction: Secondary | ICD-10-CM

## 2021-12-02 DIAGNOSIS — J449 Chronic obstructive pulmonary disease, unspecified: Secondary | ICD-10-CM | POA: Diagnosis present

## 2021-12-02 DIAGNOSIS — I10 Essential (primary) hypertension: Secondary | ICD-10-CM | POA: Diagnosis present

## 2021-12-02 DIAGNOSIS — E1151 Type 2 diabetes mellitus with diabetic peripheral angiopathy without gangrene: Secondary | ICD-10-CM | POA: Diagnosis present

## 2021-12-02 DIAGNOSIS — H9191 Unspecified hearing loss, right ear: Secondary | ICD-10-CM | POA: Diagnosis present

## 2021-12-02 DIAGNOSIS — Z8701 Personal history of pneumonia (recurrent): Secondary | ICD-10-CM

## 2021-12-02 DIAGNOSIS — E1140 Type 2 diabetes mellitus with diabetic neuropathy, unspecified: Secondary | ICD-10-CM | POA: Diagnosis present

## 2021-12-02 DIAGNOSIS — Z8249 Family history of ischemic heart disease and other diseases of the circulatory system: Secondary | ICD-10-CM

## 2021-12-02 DIAGNOSIS — Z888 Allergy status to other drugs, medicaments and biological substances status: Secondary | ICD-10-CM

## 2021-12-02 DIAGNOSIS — E878 Other disorders of electrolyte and fluid balance, not elsewhere classified: Secondary | ICD-10-CM | POA: Diagnosis present

## 2021-12-02 DIAGNOSIS — Z87891 Personal history of nicotine dependence: Secondary | ICD-10-CM

## 2021-12-02 HISTORY — DX: Malignant (primary) neoplasm, unspecified: C80.1

## 2021-12-02 HISTORY — PX: ENDARTERECTOMY: SHX5162

## 2021-12-02 HISTORY — DX: Family history of other specified conditions: Z84.89

## 2021-12-02 HISTORY — DX: Pneumonia, unspecified organism: J18.9

## 2021-12-02 HISTORY — DX: Chronic obstructive pulmonary disease, unspecified: J44.9

## 2021-12-02 HISTORY — DX: Dyspnea, unspecified: R06.00

## 2021-12-02 HISTORY — PX: PATCH ANGIOPLASTY: SHX6230

## 2021-12-02 HISTORY — DX: Anemia, unspecified: D64.9

## 2021-12-02 HISTORY — DX: Hypothyroidism, unspecified: E03.9

## 2021-12-02 HISTORY — DX: Unspecified osteoarthritis, unspecified site: M19.90

## 2021-12-02 LAB — ABO/RH: ABO/RH(D): O POS

## 2021-12-02 LAB — COMPREHENSIVE METABOLIC PANEL
ALT: 15 U/L (ref 0–44)
AST: 21 U/L (ref 15–41)
Albumin: 3.6 g/dL (ref 3.5–5.0)
Alkaline Phosphatase: 88 U/L (ref 38–126)
Anion gap: 8 (ref 5–15)
BUN: 21 mg/dL (ref 8–23)
CO2: 19 mmol/L — ABNORMAL LOW (ref 22–32)
Calcium: 9.2 mg/dL (ref 8.9–10.3)
Chloride: 108 mmol/L (ref 98–111)
Creatinine, Ser: 1.13 mg/dL — ABNORMAL HIGH (ref 0.44–1.00)
GFR, Estimated: 55 mL/min — ABNORMAL LOW (ref >60)
Glucose, Bld: 140 mg/dL — ABNORMAL HIGH (ref 70–99)
Potassium: 4 mmol/L (ref 3.5–5.1)
Sodium: 135 mmol/L (ref 135–145)
Total Bilirubin: 0.6 mg/dL (ref 0.3–1.2)
Total Protein: 6.9 g/dL (ref 6.5–8.1)

## 2021-12-02 LAB — GLUCOSE, CAPILLARY
Glucose-Capillary: 152 mg/dL — ABNORMAL HIGH (ref 70–99)
Glucose-Capillary: 141 mg/dL — ABNORMAL HIGH (ref 70–99)
Glucose-Capillary: 144 mg/dL — ABNORMAL HIGH (ref 70–99)
Glucose-Capillary: 145 mg/dL — ABNORMAL HIGH (ref 70–99)
Glucose-Capillary: 165 mg/dL — ABNORMAL HIGH (ref 70–99)

## 2021-12-02 LAB — CBC
HCT: 31.8 % — ABNORMAL LOW (ref 36.0–46.0)
Hemoglobin: 10.7 g/dL — ABNORMAL LOW (ref 12.0–15.0)
MCH: 31 pg (ref 26.0–34.0)
MCHC: 33.6 g/dL (ref 30.0–36.0)
MCV: 92.2 fL (ref 80.0–100.0)
Platelets: 197 10*3/uL (ref 150–400)
RBC: 3.45 MIL/uL — ABNORMAL LOW (ref 3.87–5.11)
RDW: 13.7 % (ref 11.5–15.5)
WBC: 6.4 10*3/uL (ref 4.0–10.5)
nRBC: 0 % (ref 0.0–0.2)

## 2021-12-02 LAB — URINALYSIS, ROUTINE W REFLEX MICROSCOPIC
Bilirubin Urine: NEGATIVE
Glucose, UA: NEGATIVE mg/dL
Hgb urine dipstick: NEGATIVE
Ketones, ur: NEGATIVE mg/dL
Leukocytes,Ua: NEGATIVE
Nitrite: NEGATIVE
Protein, ur: NEGATIVE mg/dL
Specific Gravity, Urine: 1.008 (ref 1.005–1.030)
pH: 6 (ref 5.0–8.0)

## 2021-12-02 LAB — PROTIME-INR
INR: 1 (ref 0.8–1.2)
Prothrombin Time: 13.3 seconds (ref 11.4–15.2)

## 2021-12-02 LAB — SURGICAL PCR SCREEN
MRSA, PCR: NEGATIVE
Staphylococcus aureus: NEGATIVE

## 2021-12-02 LAB — TYPE AND SCREEN
ABO/RH(D): O POS
Antibody Screen: NEGATIVE

## 2021-12-02 LAB — APTT: aPTT: 31 seconds (ref 24–36)

## 2021-12-02 LAB — POCT ACTIVATED CLOTTING TIME
Activated Clotting Time: 263 seconds
Activated Clotting Time: 275 seconds

## 2021-12-02 SURGERY — ENDARTERECTOMY, CAROTID
Anesthesia: General | Site: Neck | Laterality: Left

## 2021-12-02 MED ORDER — SODIUM CHLORIDE 0.9 % IV SOLN: INTRAVENOUS | Status: DC

## 2021-12-02 MED ORDER — ONDANSETRON HCL 4 MG/2ML IJ SOLN: 4.0000 mg | Freq: Four times a day (QID) | INTRAMUSCULAR | Status: DC | PRN

## 2021-12-02 MED ORDER — EPHEDRINE 5 MG/ML INJ
INTRAVENOUS | Status: AC
  Filled 2021-12-02: qty 5

## 2021-12-02 MED ORDER — EPHEDRINE SULFATE-NACL 50-0.9 MG/10ML-% IV SOSY
PREFILLED_SYRINGE | INTRAVENOUS | Status: DC | PRN
  Administered 2021-12-02 (×5): 5 mg via INTRAVENOUS

## 2021-12-02 MED ORDER — HEPARIN 6000 UNIT IRRIGATION SOLUTION
Status: DC | PRN
  Administered 2021-12-02: 1

## 2021-12-02 MED ORDER — DEXAMETHASONE SODIUM PHOSPHATE 10 MG/ML IJ SOLN
INTRAMUSCULAR | Status: DC | PRN
  Administered 2021-12-02: 4 mg via INTRAVENOUS

## 2021-12-02 MED ORDER — CEFAZOLIN SODIUM-DEXTROSE 2-4 GM/100ML-% IV SOLN
2.0000 g | Freq: Three times a day (TID) | INTRAVENOUS | Status: AC
  Administered 2021-12-02 – 2021-12-03 (×2): 2 g via INTRAVENOUS
  Filled 2021-12-02 (×2): qty 100

## 2021-12-02 MED ORDER — ONDANSETRON HCL 4 MG/2ML IJ SOLN
INTRAMUSCULAR | Status: AC
  Filled 2021-12-02: qty 2

## 2021-12-02 MED ORDER — 0.9 % SODIUM CHLORIDE (POUR BTL) OPTIME
TOPICAL | Status: DC | PRN
Start: 2021-12-02 — End: 2021-12-02
  Administered 2021-12-02: 500 mL
  Administered 2021-12-02: 2000 mL

## 2021-12-02 MED ORDER — ALUM & MAG HYDROXIDE-SIMETH 200-200-20 MG/5ML PO SUSP: 15.0000 mL | ORAL | Status: DC | PRN

## 2021-12-02 MED ORDER — PHENYLEPHRINE 80 MCG/ML (10ML) SYRINGE FOR IV PUSH (FOR BLOOD PRESSURE SUPPORT)
PREFILLED_SYRINGE | INTRAVENOUS | Status: DC | PRN
  Administered 2021-12-02: 80 ug via INTRAVENOUS

## 2021-12-02 MED ORDER — CEFAZOLIN SODIUM-DEXTROSE 2-4 GM/100ML-% IV SOLN
2.0000 g | INTRAVENOUS | Status: AC
  Administered 2021-12-02: 2 g via INTRAVENOUS

## 2021-12-02 MED ORDER — ROCURONIUM BROMIDE 10 MG/ML (PF) SYRINGE
PREFILLED_SYRINGE | INTRAVENOUS | Status: DC | PRN
  Administered 2021-12-02 (×2): 20 mg via INTRAVENOUS
  Administered 2021-12-02: 50 mg via INTRAVENOUS

## 2021-12-02 MED ORDER — LEVOTHYROXINE SODIUM 50 MCG PO TABS
50.0000 ug | ORAL_TABLET | Freq: Every day | ORAL | Status: DC
  Administered 2021-12-03: 50 ug via ORAL
  Filled 2021-12-02: qty 1

## 2021-12-02 MED ORDER — PROTAMINE SULFATE 10 MG/ML IV SOLN
INTRAVENOUS | Status: AC
  Filled 2021-12-02: qty 5

## 2021-12-02 MED ORDER — ONDANSETRON HCL 4 MG/2ML IJ SOLN: 4.0000 mg | Freq: Once | INTRAMUSCULAR | Status: DC | PRN

## 2021-12-02 MED ORDER — INSULIN ASPART 100 UNIT/ML IJ SOLN
0.0000 [IU] | Freq: Three times a day (TID) | INTRAMUSCULAR | Status: DC
  Administered 2021-12-03: 8 [IU] via SUBCUTANEOUS

## 2021-12-02 MED ORDER — MIDAZOLAM HCL 2 MG/2ML IJ SOLN
INTRAMUSCULAR | Status: AC
  Filled 2021-12-02: qty 2

## 2021-12-02 MED ORDER — PROTAMINE SULFATE 10 MG/ML IV SOLN
INTRAVENOUS | Status: DC | PRN
  Administered 2021-12-02: 50 mg via INTRAVENOUS

## 2021-12-02 MED ORDER — CLOPIDOGREL BISULFATE 75 MG PO TABS
ORAL_TABLET | ORAL | Status: AC
  Administered 2021-12-02: 75 mg via ORAL
  Filled 2021-12-02: qty 1

## 2021-12-02 MED ORDER — ROCURONIUM BROMIDE 10 MG/ML (PF) SYRINGE
PREFILLED_SYRINGE | INTRAVENOUS | Status: AC
  Filled 2021-12-02: qty 10

## 2021-12-02 MED ORDER — PHENYLEPHRINE 80 MCG/ML (10ML) SYRINGE FOR IV PUSH (FOR BLOOD PRESSURE SUPPORT)
PREFILLED_SYRINGE | INTRAVENOUS | Status: AC
  Filled 2021-12-02: qty 10

## 2021-12-02 MED ORDER — HEPARIN 6000 UNIT IRRIGATION SOLUTION
Status: AC
  Filled 2021-12-02: qty 500

## 2021-12-02 MED ORDER — LABETALOL HCL 5 MG/ML IV SOLN: 10.0000 mg | INTRAVENOUS | Status: DC | PRN

## 2021-12-02 MED ORDER — AMLODIPINE BESYLATE 5 MG PO TABS
5.0000 mg | ORAL_TABLET | Freq: Every morning | ORAL | Status: DC
Start: 2021-12-03 — End: 2021-12-03
  Filled 2021-12-02: qty 1

## 2021-12-02 MED ORDER — PROPOFOL 1000 MG/100ML IV EMUL
INTRAVENOUS | Status: AC
  Filled 2021-12-02: qty 100

## 2021-12-02 MED ORDER — CEFAZOLIN SODIUM-DEXTROSE 2-4 GM/100ML-% IV SOLN
INTRAVENOUS | Status: AC
  Filled 2021-12-02: qty 100

## 2021-12-02 MED ORDER — SERTRALINE HCL 100 MG PO TABS
100.0000 mg | ORAL_TABLET | Freq: Every morning | ORAL | Status: DC
  Administered 2021-12-03: 100 mg via ORAL
  Filled 2021-12-02: qty 1

## 2021-12-02 MED ORDER — METHAZOLAMIDE 25 MG PO TABS
50.0000 mg | ORAL_TABLET | Freq: Two times a day (BID) | ORAL | Status: DC
  Filled 2021-12-02 (×3): qty 1

## 2021-12-02 MED ORDER — DEXTRAN 40 IN SALINE 10-0.9 % IV SOLN
INTRAVENOUS | Status: AC | PRN
  Administered 2021-12-02: 500 mL

## 2021-12-02 MED ORDER — POTASSIUM CHLORIDE CRYS ER 20 MEQ PO TBCR: 20.0000 meq | EXTENDED_RELEASE_TABLET | Freq: Every day | ORAL | Status: DC | PRN

## 2021-12-02 MED ORDER — NICOTINE 21 MG/24HR TD PT24
21.0000 mg | MEDICATED_PATCH | TRANSDERMAL | Status: DC
  Administered 2021-12-02: 21 mg via TRANSDERMAL
  Filled 2021-12-02: qty 1

## 2021-12-02 MED ORDER — GUAIFENESIN-DM 100-10 MG/5ML PO SYRP: 15.0000 mL | ORAL_SOLUTION | ORAL | Status: DC | PRN

## 2021-12-02 MED ORDER — INSULIN ASPART 100 UNIT/ML IJ SOLN: 0.0000 [IU] | INTRAMUSCULAR | Status: DC | PRN

## 2021-12-02 MED ORDER — ASPIRIN 81 MG PO TBEC
DELAYED_RELEASE_TABLET | ORAL | Status: AC
  Administered 2021-12-02: 81 mg via ORAL
  Filled 2021-12-02: qty 1

## 2021-12-02 MED ORDER — PHENYLEPHRINE HCL-NACL 20-0.9 MG/250ML-% IV SOLN
INTRAVENOUS | Status: DC | PRN
  Administered 2021-12-02: 20 ug/min via INTRAVENOUS

## 2021-12-02 MED ORDER — PANTOPRAZOLE SODIUM 40 MG PO TBEC
40.0000 mg | DELAYED_RELEASE_TABLET | Freq: Every day | ORAL | Status: DC
  Administered 2021-12-02 – 2021-12-03 (×2): 40 mg via ORAL
  Filled 2021-12-02 (×2): qty 1

## 2021-12-02 MED ORDER — LACTATED RINGERS IV SOLN: INTRAVENOUS | Status: DC

## 2021-12-02 MED ORDER — LIDOCAINE HCL (PF) 1 % IJ SOLN
INTRAMUSCULAR | Status: AC
  Filled 2021-12-02: qty 30

## 2021-12-02 MED ORDER — CLOPIDOGREL BISULFATE 75 MG PO TABS
75.0000 mg | ORAL_TABLET | Freq: Every day | ORAL | Status: DC
  Administered 2021-12-03: 75 mg via ORAL
  Filled 2021-12-02: qty 1

## 2021-12-02 MED ORDER — METHAZOLAMIDE 50 MG PO TABS
50.0000 mg | ORAL_TABLET | Freq: Two times a day (BID) | ORAL | Status: DC
  Filled 2021-12-02: qty 1

## 2021-12-02 MED ORDER — HEPARIN SODIUM (PORCINE) 1000 UNIT/ML IJ SOLN
INTRAMUSCULAR | Status: DC | PRN
  Administered 2021-12-02: 6000 [IU] via INTRAVENOUS

## 2021-12-02 MED ORDER — OXYBUTYNIN CHLORIDE ER 10 MG PO TB24
10.0000 mg | ORAL_TABLET | Freq: Every morning | ORAL | Status: DC
Start: 2021-12-03 — End: 2021-12-03
  Administered 2021-12-03: 10 mg via ORAL
  Filled 2021-12-02: qty 1

## 2021-12-02 MED ORDER — DOCUSATE SODIUM 100 MG PO CAPS
100.0000 mg | ORAL_CAPSULE | Freq: Every day | ORAL | Status: DC
  Administered 2021-12-03: 100 mg via ORAL
  Filled 2021-12-02: qty 1

## 2021-12-02 MED ORDER — FENTANYL CITRATE (PF) 100 MCG/2ML IJ SOLN: 25.0000 ug | INTRAMUSCULAR | Status: DC | PRN

## 2021-12-02 MED ORDER — CHLORHEXIDINE GLUCONATE CLOTH 2 % EX PADS: 6.0000 | MEDICATED_PAD | Freq: Once | CUTANEOUS | Status: DC

## 2021-12-02 MED ORDER — AMLODIPINE BESYLATE 5 MG PO TABS
2.5000 mg | ORAL_TABLET | Freq: Every day | ORAL | Status: DC
  Administered 2021-12-02: 2.5 mg via ORAL
  Filled 2021-12-02: qty 1

## 2021-12-02 MED ORDER — FENTANYL CITRATE (PF) 100 MCG/2ML IJ SOLN
INTRAMUSCULAR | Status: DC | PRN
  Administered 2021-12-02 (×5): 50 ug via INTRAVENOUS

## 2021-12-02 MED ORDER — LIDOCAINE 2% (20 MG/ML) 5 ML SYRINGE
INTRAMUSCULAR | Status: AC
  Filled 2021-12-02: qty 5

## 2021-12-02 MED ORDER — ACETAMINOPHEN 650 MG RE SUPP: 325.0000 mg | RECTAL | Status: DC | PRN

## 2021-12-02 MED ORDER — SUGAMMADEX SODIUM 200 MG/2ML IV SOLN
INTRAVENOUS | Status: DC | PRN
  Administered 2021-12-02: 200 mg via INTRAVENOUS

## 2021-12-02 MED ORDER — ACETAMINOPHEN 500 MG PO TABS
ORAL_TABLET | ORAL | Status: AC
  Administered 2021-12-02: 1000 mg via ORAL
  Filled 2021-12-02: qty 2

## 2021-12-02 MED ORDER — DEXAMETHASONE SODIUM PHOSPHATE 10 MG/ML IJ SOLN
INTRAMUSCULAR | Status: AC
  Filled 2021-12-02: qty 1

## 2021-12-02 MED ORDER — FENTANYL CITRATE (PF) 250 MCG/5ML IJ SOLN
INTRAMUSCULAR | Status: AC
  Filled 2021-12-02: qty 5

## 2021-12-02 MED ORDER — ALBUTEROL SULFATE (2.5 MG/3ML) 0.083% IN NEBU: 2.5000 mg | INHALATION_SOLUTION | Freq: Four times a day (QID) | RESPIRATORY_TRACT | Status: DC | PRN

## 2021-12-02 MED ORDER — ATORVASTATIN CALCIUM 40 MG PO TABS
40.0000 mg | ORAL_TABLET | Freq: Every morning | ORAL | Status: DC
  Administered 2021-12-03: 40 mg via ORAL
  Filled 2021-12-02: qty 1

## 2021-12-02 MED ORDER — CHLORHEXIDINE GLUCONATE 0.12 % MT SOLN
OROMUCOSAL | Status: AC
  Administered 2021-12-02: 15 mL via OROMUCOSAL
  Filled 2021-12-02: qty 15

## 2021-12-02 MED ORDER — BUPROPION HCL ER (XL) 150 MG PO TB24
150.0000 mg | ORAL_TABLET | Freq: Every morning | ORAL | Status: DC
  Administered 2021-12-03: 150 mg via ORAL
  Filled 2021-12-02: qty 1

## 2021-12-02 MED ORDER — PROPOFOL 10 MG/ML IV BOLUS
INTRAVENOUS | Status: DC | PRN
  Administered 2021-12-02 (×2): 40 mg via INTRAVENOUS
  Administered 2021-12-02: 120 mg via INTRAVENOUS

## 2021-12-02 MED ORDER — CHLORHEXIDINE GLUCONATE 0.12 % MT SOLN: 15.0000 mL | Freq: Once | OROMUCOSAL | Status: AC

## 2021-12-02 MED ORDER — PHENOL 1.4 % MT LIQD: 1.0000 | OROMUCOSAL | Status: DC | PRN

## 2021-12-02 MED ORDER — SODIUM CHLORIDE 0.9 % IV SOLN
0.1500 ug/kg/min | INTRAVENOUS | Status: AC
  Administered 2021-12-02: .05 ug/kg/min via INTRAVENOUS
  Filled 2021-12-02: qty 2000

## 2021-12-02 MED ORDER — OXYCODONE HCL 5 MG/5ML PO SOLN: 5.0000 mg | Freq: Once | ORAL | Status: DC | PRN

## 2021-12-02 MED ORDER — METOPROLOL TARTRATE 5 MG/5ML IV SOLN: 2.0000 mg | INTRAVENOUS | Status: DC | PRN

## 2021-12-02 MED ORDER — ONDANSETRON HCL 4 MG/2ML IJ SOLN
INTRAMUSCULAR | Status: DC | PRN
  Administered 2021-12-02: 4 mg via INTRAVENOUS

## 2021-12-02 MED ORDER — SODIUM CHLORIDE 0.9 % IV SOLN: 500.0000 mL | Freq: Once | INTRAVENOUS | Status: DC | PRN

## 2021-12-02 MED ORDER — ACETAMINOPHEN 325 MG PO TABS
325.0000 mg | ORAL_TABLET | ORAL | Status: DC | PRN
  Administered 2021-12-03: 650 mg via ORAL
  Filled 2021-12-02: qty 2

## 2021-12-02 MED ORDER — ACETAMINOPHEN 500 MG PO TABS: 1000.0000 mg | ORAL_TABLET | Freq: Once | ORAL | Status: AC

## 2021-12-02 MED ORDER — ASPIRIN 325 MG PO TABS
325.0000 mg | ORAL_TABLET | Freq: Every day | ORAL | Status: DC
  Administered 2021-12-02 – 2021-12-03 (×2): 325 mg via ORAL
  Filled 2021-12-02 (×2): qty 1

## 2021-12-02 MED ORDER — MIDAZOLAM HCL 2 MG/2ML IJ SOLN
INTRAMUSCULAR | Status: DC | PRN
  Administered 2021-12-02: 1 mg via INTRAVENOUS

## 2021-12-02 MED ORDER — HYDRALAZINE HCL 20 MG/ML IJ SOLN: 5.0000 mg | INTRAMUSCULAR | Status: DC | PRN

## 2021-12-02 MED ORDER — HEMOSTATIC AGENTS (NO CHARGE) OPTIME
TOPICAL | Status: DC | PRN
  Administered 2021-12-02 (×2): 1 via TOPICAL

## 2021-12-02 MED ORDER — ORAL CARE MOUTH RINSE: 15.0000 mL | Freq: Once | OROMUCOSAL | Status: AC

## 2021-12-02 MED ORDER — PREGABALIN 100 MG PO CAPS
300.0000 mg | ORAL_CAPSULE | Freq: Two times a day (BID) | ORAL | Status: DC
  Administered 2021-12-03: 300 mg via ORAL
  Filled 2021-12-02: qty 3

## 2021-12-02 MED ORDER — OXYCODONE-ACETAMINOPHEN 5-325 MG PO TABS
1.0000 | ORAL_TABLET | ORAL | Status: DC | PRN
  Filled 2021-12-02: qty 2

## 2021-12-02 MED ORDER — LIDOCAINE 2% (20 MG/ML) 5 ML SYRINGE
INTRAMUSCULAR | Status: DC | PRN
  Administered 2021-12-02: 60 mg via INTRAVENOUS

## 2021-12-02 MED ORDER — ASPIRIN 81 MG PO TBEC: 81.0000 mg | DELAYED_RELEASE_TABLET | Freq: Once | ORAL | Status: AC

## 2021-12-02 MED ORDER — MAGNESIUM SULFATE 2 GM/50ML IV SOLN: 2.0000 g | Freq: Every day | INTRAVENOUS | Status: DC | PRN

## 2021-12-02 MED ORDER — CLOPIDOGREL BISULFATE 75 MG PO TABS: 75.0000 mg | ORAL_TABLET | Freq: Once | ORAL | Status: AC

## 2021-12-02 MED ORDER — OXYCODONE HCL 5 MG PO TABS: 5.0000 mg | ORAL_TABLET | Freq: Once | ORAL | Status: DC | PRN

## 2021-12-02 MED ORDER — MORPHINE SULFATE (PF) 2 MG/ML IV SOLN: 2.0000 mg | INTRAVENOUS | Status: DC | PRN

## 2021-12-02 SURGICAL SUPPLY — 68 items
ADH SKN CLS APL DERMABOND .7 (GAUZE/BANDAGES/DRESSINGS) ×1
ADPR TBG 2 MALE LL ART (MISCELLANEOUS)
AGENT HMST KT MTR STRL THRMB (HEMOSTASIS) ×1
BAG COUNTER SPONGE SURGICOUNT (BAG) ×3 IMPLANT
BAG SPNG CNTER NS LX DISP (BAG) ×1
CANISTER SUCT 3000ML PPV (MISCELLANEOUS) ×3 IMPLANT
CANNULA VESSEL 3MM 2 BLNT TIP (CANNULA) ×3 IMPLANT
CATH ROBINSON RED A/P 18FR (CATHETERS) ×3 IMPLANT
CLIP LIGATING EXTRA MED SLVR (CLIP) ×3 IMPLANT
CLIP LIGATING EXTRA SM BLUE (MISCELLANEOUS) ×3 IMPLANT
COVER PROBE W GEL 5X96 (DRAPES) ×1 IMPLANT
COVER PROBE W/GEL STERILE (IV SETS) ×1 IMPLANT
COVER TRANSDUCER ULTRASND GEL (DISPOSABLE) ×2 IMPLANT
DERMABOND ADVANCED (GAUZE/BANDAGES/DRESSINGS) ×1
DERMABOND ADVANCED .7 DNX12 (GAUZE/BANDAGES/DRESSINGS) ×2 IMPLANT
DRAIN CHANNEL 15F RND FF W/TCR (WOUND CARE) IMPLANT
DRAPE HALF SHEET 40X57 (DRAPES) ×1 IMPLANT
DRAPE INCISE IOBAN 66X45 STRL (DRAPES) ×1 IMPLANT
ELECT REM PT RETURN 9FT ADLT (ELECTROSURGICAL) ×2
ELECTRODE REM PT RTRN 9FT ADLT (ELECTROSURGICAL) ×2 IMPLANT
EVACUATOR SILICONE 100CC (DRAIN) IMPLANT
GLOVE BIO SURGEON STRL SZ 6.5 (GLOVE) ×1 IMPLANT
GLOVE BIO SURGEON STRL SZ7.5 (GLOVE) ×3 IMPLANT
GLOVE BIOGEL PI IND STRL 8 (GLOVE) ×2 IMPLANT
GLOVE BIOGEL PI INDICATOR 8 (GLOVE) ×1
GLOVE SRG 8 PF TXTR STRL LF DI (GLOVE) ×2 IMPLANT
GLOVE SURG UNDER POLY LF SZ8 (GLOVE) ×2
GOWN STRL REUS W/ TWL LRG LVL3 (GOWN DISPOSABLE) ×4 IMPLANT
GOWN STRL REUS W/TWL 2XL LVL3 (GOWN DISPOSABLE) ×3 IMPLANT
GOWN STRL REUS W/TWL LRG LVL3 (GOWN DISPOSABLE) ×8
HEMOSTAT SNOW SURGICEL 2X4 (HEMOSTASIS) ×1 IMPLANT
IV ADAPTER SYR DOUBLE MALE LL (MISCELLANEOUS) IMPLANT
KIT BASIN OR (CUSTOM PROCEDURE TRAY) ×3 IMPLANT
KIT SHUNT ARGYLE CAROTID ART 6 (VASCULAR PRODUCTS) ×1 IMPLANT
KIT TURNOVER KIT B (KITS) ×3 IMPLANT
LOOP VESSEL MINI RED (MISCELLANEOUS) ×1 IMPLANT
NDL HYPO 25GX1X1/2 BEV (NEEDLE) IMPLANT
NDL SPNL 20GX3.5 QUINCKE YW (NEEDLE) IMPLANT
NEEDLE HYPO 25GX1X1/2 BEV (NEEDLE) IMPLANT
NEEDLE SPNL 20GX3.5 QUINCKE YW (NEEDLE) ×2 IMPLANT
NS IRRIG 1000ML POUR BTL (IV SOLUTION) ×10 IMPLANT
PACK CAROTID (CUSTOM PROCEDURE TRAY) ×3 IMPLANT
PAD ARMBOARD 7.5X6 YLW CONV (MISCELLANEOUS) ×6 IMPLANT
PATCH VASC XENOSURE 1CMX6CM (Vascular Products) ×2 IMPLANT
PATCH VASC XENOSURE 1X6 (Vascular Products) IMPLANT
POSITIONER HEAD DONUT 9IN (MISCELLANEOUS) ×3 IMPLANT
SET WALTER ACTIVATION W/DRAPE (SET/KITS/TRAYS/PACK) ×3 IMPLANT
SHUNT CAROTID BYPASS 10 (VASCULAR PRODUCTS) IMPLANT
SHUNT CAROTID BYPASS 12FRX15.5 (VASCULAR PRODUCTS) IMPLANT
SPONGE SURGIFOAM ABS GEL 100 (HEMOSTASIS) IMPLANT
STOPCOCK 4 WAY LG BORE MALE ST (IV SETS) IMPLANT
SURGIFLO W/THROMBIN 8M KIT (HEMOSTASIS) ×1 IMPLANT
SUT ETHILON 3 0 PS 1 (SUTURE) IMPLANT
SUT MNCRL AB 4-0 PS2 18 (SUTURE) ×3 IMPLANT
SUT PROLENE 5 0 C 1 24 (SUTURE) ×3 IMPLANT
SUT PROLENE 6 0 BV (SUTURE) ×6 IMPLANT
SUT PROLENE 7 0 BV 1 (SUTURE) ×2 IMPLANT
SUT SILK 3 0 (SUTURE) ×2
SUT SILK 3-0 18XBRD TIE 12 (SUTURE) IMPLANT
SUT VIC AB 2-0 CT1 27 (SUTURE) ×2
SUT VIC AB 2-0 CT1 TAPERPNT 27 (SUTURE) ×2 IMPLANT
SUT VIC AB 3-0 SH 27 (SUTURE) ×2
SUT VIC AB 3-0 SH 27X BRD (SUTURE) ×2 IMPLANT
SYR 20CC LL (SYRINGE) ×6 IMPLANT
SYR CONTROL 10ML LL (SYRINGE) IMPLANT
TOWEL GREEN STERILE (TOWEL DISPOSABLE) ×3 IMPLANT
TUBING ART PRESS 48 MALE/FEM (TUBING) IMPLANT
WATER STERILE IRR 1000ML POUR (IV SOLUTION) ×3 IMPLANT

## 2021-12-02 NOTE — Anesthesia Postprocedure Evaluation (Signed)
Anesthesia Post Note  Patient: Michelle Nicholson  Procedure(s) Performed: LEFT CAROTID ENDARTERECTOMY (Left: Neck) PATCH ANGIOPLASTY USING XENOSURE BIOLOGIC 1cm x 6cm PATCH (Left: Neck)     Patient location during evaluation: PACU Anesthesia Type: General Level of consciousness: awake and alert, oriented and patient cooperative Pain management: pain level controlled Vital Signs Assessment: post-procedure vital signs reviewed and stable Respiratory status: spontaneous breathing, nonlabored ventilation and respiratory function stable Cardiovascular status: blood pressure returned to baseline and stable Postop Assessment: no apparent nausea or vomiting Anesthetic complications: no   No notable events documented.  Last Vitals:  Vitals:   12/02/21 0858 12/02/21 1527  BP: 115/62 (!) 106/53  Pulse:  65  Resp:  20  Temp:  (!) 36.4 C  SpO2:  95%    Last Pain:  Vitals:   12/02/21 1527  TempSrc:   PainSc: 0-No pain                 Pervis Hocking

## 2021-12-02 NOTE — H&P (Signed)
[facility] [place] CONSULT NOTE   Patient seen and examined in preop holding.  No complaints. No changes to medication history or physical exam since last seen. After discussing the risks and benefits of left-sided carotid endarterectomy for symptomatic ICA stenosis, Michelle Nicholson elected to proceed.   Broadus John MD   MRN : [id number]  Reason for Consult: left ICA high grade stenosis Referring Physician:   History of Present Illness: 61 y/o female presented to the ED with progressive worsening balance.  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    MRI of the brain with and without contrast shows small acute cortically based infarct.  We have been asked to consult for left ICA stenosis in the setting of a new ischemic brain event.  She denies weakness in her extremities, amaurosis or aphasia.   Past medical history includes: diabetes, hypertension, neuropathy,      Current Facility-Administered Medications  Medication Dose Route Frequency Provider Last Rate Last Admin   0.9 %  sodium chloride infusion   Intravenous Continuous Broadus John, MD 10 mL/hr at 12/02/21 1038 New Bag at 12/02/21 1038   ceFAZolin (ANCEF) 2-4 GM/100ML-% IVPB            ceFAZolin (ANCEF) IVPB 2g/100 mL premix  2 g Intravenous 30 min Pre-Op Broadus John, MD       Chlorhexidine Gluconate Cloth 2 % PADS 6 each  6 each Topical Once Broadus John, MD       And   Chlorhexidine Gluconate Cloth 2 % PADS 6 each  6 each Topical Once Broadus John, MD       insulin aspart (novoLOG) injection 0-7 Units  0-7 Units Subcutaneous Q2H PRN Michelle Pili, MD       lactated ringers infusion   Intravenous Continuous Michelle Pili, MD 10 mL/hr at 12/02/21 1037 Continued from Pre-op at 12/02/21 1037   remifentanil (ULTIVA) 2 mg in 100 mL normal saline (20 mcg/mL) Optime  0.15 mcg/kg/min Intravenous Continuous Michelle Bal, CRNA        Pt  meds include: Statin :yes Betablocker: No ASA: Yes Other anticoagulants/antiplatelets: Plavix  Past Medical History:  Diagnosis Date   Anemia    Arthritis    Cancer ([place])    skin ca, squamous, basal   COPD (chronic obstructive pulmonary disease) ([place])    Diabetes mellitus    Dyspnea    Family history of adverse reaction to anesthesia    `mother - N/V, Brother N/V   Hypertension    Hypothyroidism    Neuropathy    Pancreas disorder    Peripheral vascular disease ([place])    Pneumonia    04/2021, 2003   Stroke ([place]) 2017   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, equilibrium    Past Surgical History:  Procedure Laterality Date   CARDIAC CATHETERIZATION      Social History Social History   Tobacco Use   Smoking status: Former    Types: Cigarettes    Quit date: 11/27/2021    Years since quitting: 0.0   Smokeless tobacco: Never  Vaping Use   Vaping Use: Never used  Substance Use Topics   Alcohol use: No    Comment: 2 drinks a month   Drug use: No    Family History Family History  Problem Relation Age of Onset   Cancer Mother    Hypertension Father    Diabetes Father  Cancer Sister    Diabetes Sister    Diabetes Brother    Kidney failure Brother    CAD Brother    Diabetes Other     Allergies  Allergen Reactions   Dorzolamide Hcl-Timolol Mal Itching and Other (See Comments)    Makes her eyes burn Conjunctival Injection with Burning Visual Acuity Blurry    Brimonidine Rash and Other (See Comments)    Follicular conjunctivitis   Netarsudil-Latanoprost Other (See Comments)    Redness, tearing, burning     REVIEW OF SYSTEMS  General: '[ ]'$  Weight loss, '[ ]'$  Fever, '[ ]'$  chills Neurologic: '[ ]'$  Dizziness, '[ ]'$  Blackouts, '[ ]'$  Seizure '[ ]'$  Stroke, '[ ]'$  "Mini stroke", '[ ]'$  Slurred speech, '[ ]'$  Temporary blindness; '[ ]'$  weakness in arms or legs, '[ ]'$  Hoarseness '[ ]'$  Dysphagia Cardiac: '[ ]'$  Chest pain/pressure, '[ ]'$  Shortness of breath at rest '[ ]'$  Shortness of breath with exertion, [  ] Atrial fibrillation or irregular heartbeat  Vascular: '[ ]'$  Pain in legs with walking, '[ ]'$  Pain in legs at rest, '[ ]'$  Pain in legs at night,  '[ ]'$  Non-healing ulcer, '[ ]'$  Blood clot in vein/DVT,   Pulmonary: '[ ]'$  Home oxygen, '[ ]'$  Productive cough, '[ ]'$  Coughing up blood, '[ ]'$  Asthma,  '[ ]'$  Wheezing '[ ]'$  COPD Musculoskeletal:  '[ ]'$  Arthritis, '[ ]'$  Low back pain, '[ ]'$  Joint pain Hematologic: '[ ]'$  Easy Bruising, '[ ]'$  Anemia; '[ ]'$  Hepatitis Gastrointestinal: '[ ]'$  Blood in stool, '[ ]'$  Gastroesophageal Reflux/heartburn, Urinary: '[ ]'$  chronic Kidney disease, '[ ]'$  on HD - '[ ]'$  MWF or '[ ]'$  TTHS, '[ ]'$  Burning with urination, '[ ]'$  Difficulty urinating Skin: '[ ]'$  Rashes, '[ ]'$  Wounds Psychological: '[ ]'$  Anxiety, '[ ]'$  Depression  Physical Examination Vitals:   12/02/21 0849 12/02/21 0858  BP: (!) 113/44 115/62  Pulse: 62   Resp: 18   TempSrc: Oral   SpO2: 100%   Weight: 68 kg   Height: '5\' 9"'$  (1.753 m)    Body mass index is 22.15 kg/m.  General:  WDWN in NAD Gait: Normal HENT: WNL Eyes: Pupils equal Pulmonary: normal non-labored breathing , without Rales, rhonchi,  wheezing Cardiac: RRR, without  Murmurs, rubs or gallops; No carotid bruits Abdomen: soft, NT, no masses Skin: no rashes, ulcers noted;  no Gangrene , no cellulitis; no open wounds;   Vascular Exam/Pulses:palpable radial pulse B   Musculoskeletal: no muscle wasting or atrophy; no edema  Neurologic: A&O X 3; Appropriate Affect ;  SENSATION: normal; MOTOR FUNCTION: 5/5 Symmetric Speech is fluent/normal   Significant Diagnostic Studies: CBC Lab Results  Component Value Date   WBC 6.4 12/02/2021   HGB 10.7 (L) 12/02/2021   HCT 31.8 (L) 12/02/2021   MCV 92.2 12/02/2021   PLT 197 12/02/2021    BMET    Component Value Date/Time   NA 135 12/02/2021 0949   K 4.0 12/02/2021 0949   CL 108 12/02/2021 0949   CO2 19 (L) 12/02/2021 0949   GLUCOSE 140 (H) 12/02/2021 0949   BUN 21 12/02/2021 0949   CREATININE 1.13 (H) 12/02/2021 0949   CALCIUM  9.2 12/02/2021 0949   GFRNONAA 55 (L) 12/02/2021 0949   GFRAA >60 04/02/2019 2338   Estimated Creatinine Clearance: 53.9 mL/min (A) (by C-G formula based on SCr of 1.13 mg/dL (H)).  COAG Lab Results  Component Value Date   INR 1.0 12/02/2021   INR 1.2 04/21/2021     Non-Invasive  Vascular Imaging:  MRI   IMPRESSION: 1. Small acute cortically based infarct in the left anterior motor strip near the right upper extremity representation area. And nearby more subacute appearing white matter small vessel ischemia in the posterior left corona radiata. No associated hemorrhage or mass effect.   2. Positive also for indeterminate punctate abnormal enhancement in the anterior left frontal lobe (series 10, image 31), new since 2017. In light of #1, perhaps this is enhancement of a late subacute small vessel infarct. But a repeat Brain MRI without and with contrast is recommended in 3 months to re-evaluate.   3. Chronically advanced signal changes in the pons, nonspecific but favor small vessel disease related in this setting. Mild to moderate for age cerebral white matter signal changes have also progressed since 2017.  CTA neck IMPRESSION: 1. Negative for large vessel occlusion.   2. Positive for advanced atherosclerosis in the neck and at the skull base: - soft and calcified Left Carotid atherosclerosis in the neck with bulky plaque at the Left ICA origin and high-grade RADIOGRAPHIC STRING SIGN stenosis. - heavily calcified ICA siphons. Moderate to Severe Left anterior genu and moderate Right cavernous ICA siphon stenoses. - Only mild vertebral artery stenosis.   3. Aortic Atherosclerosis (ICD10-I70.0).    ASSESSMENT/PLAN:  Left ICA sever high grade stenosis"string sign" on CTA in the setting of stroke symptoms to include: worsening balance with history of CVA event in 2017.     She will require intervention of the left ICA open endarterectomy verses TCAR.  Dr. Virl Cagey will  review and examine the patient to determine the best course of intervention.     Broadus John 12/02/2021 11:32 AM   Addendum:   In short, Kylena is a 61 year old female with longstanding history of smoking with waxing and waning episodes of dizziness over the last several years who has had previous diagnoses of Mnire's disease as well as vertigo.  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  Imaging demonstrates greater than 80% stenosis of the left internal carotid artery.  I had a long discussion with Michelle Nicholson and her daughter regarding symptomatic carotid artery stenosis and notably the reduction in stroke rate with revascularization from 26% to 9% over the course of 2 years. We discussed both transcarotid artery revascularization as well as carotid endarterectomy.  We discussed the risks and benefits of each.  I asked Michelle Nicholson to consider her options overnight, and that we would have further discussion in the morning.  Michelle Nicholson is aware, that treatment is to reduce the risk of future stroke, and would likely not affect/improve current symptoms.  She is still has some residual dizziness.  Michelle Nicholson is followed by Dr. Nyoka Cowden in [facility] who has performed balloon angioplasty to bilateral lower extremities.  I offered to relay this information to him should she choose to pursue carotid revascularization in [facility].  Please continue current medical therapy being dual antiplatelet, high intensity statin We discussed smoking cessation  Would appreciate cardiology consult for preop risk assessment as pt had notable labored breathing with conversation and stated she would have difficulty climbing a flight of stairs with out significant SOB.   I will see her in the morning to finalize surgical plans versus follow-up.   Broadus John

## 2021-12-02 NOTE — Op Note (Signed)
NAME: Michelle Nicholson    MRN: [id number] DOB: October 22, 1960    DATE OF OPERATION: 12/02/2021  PREOP DIAGNOSIS:    Symptomatic left internal carotid artery stenosis  POSTOP DIAGNOSIS:    Same  PROCEDURE:    Left-sided carotid endarterectomy, patch plasty Intraoperative carotid ultrasound  SURGEON: Broadus John  ASSIST: Dagoberto Ligas, PA  ANESTHESIA: General  EBL: 60 mL  INDICATIONS:    Michelle Nicholson is a 61 y.o. female In short, Michelle Nicholson is a 61 year old female with longstanding history of smoking with waxing and waning episodes of dizziness over the last several years who has had previous diagnoses of Mnire's disease as well as vertigo.  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  Imaging demonstrates greater than 80% stenosis of the left internal carotid artery.  I had a long discussion with [name] and her daughter regarding symptomatic carotid artery stenosis and notably the reduction in stroke rate with revascularization from 26% to 9% over the course of 2 years. We discussed both transcarotid artery revascularization as well as carotid endarterectomy.  We discussed the risks and benefits of each.  [name] elected to pursue left-sided carotid endarterectomy.  FINDINGS:   Greater than 80% stenosis of the internal carotid artery.  Kinking appreciated in the proximal internal carotid artery  TECHNIQUE:   After full informed written consent was obtained from the patient, the patient was brought back to the operating room and placed supine upon the operating table.  Prior to induction, the patient received IV antibiotics.  After obtaining adequate anesthesia, the patient was placed into semi-Fowler position with a shoulder roll in place and the patient's neck slightly hyperextended and rotated away from the surgical site.  The patient was prepped in the standard fashion for a left carotid endarterectomy.  I made an  incision anterior to the sternocleidomastoid muscle and dissected down through the subcutaneous tissue.  The platysmas was opened with electrocautery.  Then I dissected down to the internal jugular vein.  This was dissected posteriorly until I obtained visualization of the common carotid artery.  This was dissected out and then an umbilical tape was placed around the common carotid artery and I loosely applied a Rumel tourniquet.  I then dissected in a periadventitial fashion along the common carotid artery up to the bifurcation.  I then identified the external carotid artery and the superior thyroid artery.  A 2-0 silk tie was looped around the superior thyroid artery, and I also dissected out the external carotid artery and placed a vessel loop around it.  In continuing the dissection to the internal carotid artery, I identified the facial vein.  This was ligated and then transected, giving me improved exposure of the internal carotid artery.  In the process of this dissection, the hypoglossal nerve was identified.  I then dissected out the internal carotid artery until I identified an area of soft tissue in the internal carotid artery.  This was above an area of kinking initially appreciated on CT. I dissected slightly distal to this area, and placed an vessel loop around the artery and loosely applied a Rumel tourniquet.  At this point, we gave the patient a therapeutic bolus of Heparin intravenously (roughly 80 units/kg).  After waiting 3 minutes, then I clamped the internal carotid artery, external carotid artery and then the common carotid artery.  I then made an arteriotomy in the common carotid artery with a 11  blade, and extended the arteriotomy with a Potts scissor down into the common carotid artery, then I carried the arteriotomy through the bifurcation into the internal carotid artery until I reached an area that was not diseased.  At this point, I took the 10 shunt that previously been prepared and I  inserted it into the internal carotid artery.  The Rumel tourniquet was then applied to this end of the shunt.  I unclamped the shunt to verify retrograde blood flow in the internal carotid artery.  I then placed the other end of the shunt into the common carotid artery after unclamping the artery.  The Rumel was tightened down around the shunt.  At this point, I verified blood flow in the shunt with a continuous doppler.  At this point, I started the endarterectomy in the common carotid artery with a Technical brewer and carried this dissection down into the common carotid artery circumferentially.  Then I transected the plaque at a segment where it was adherent.  I then carried this dissection up into the external carotid artery.  The plaque was extracted by unclamping the external carotid artery and everting the artery.  The dissection was then carried into the internal carotid artery, extracting the remaining portion of the carotid plaque.  I passed the plaque off the field as a specimen.  I then spent the next 30 minutes removing intimal flaps and loose debris.  Eventually I reached the point where the residual plaque was densely adherent and any further dissection would compromise the integrity of the wall.  After verifying that there was no more loose intimal flaps or debris, I re-interrogated the entirety of this carotid artery.  At this point, I was satisfied that the minimal remaining disease was densely adherent to the wall and wall integrity was intact.  At this point, I then fashioned a bovine pericardial patch for the geometry of this artery and sewed it in place with two running stitch of 6-0 Prolene, one from each end.  Prior to completing this patch angioplasty, I removed the shunt first from the internal carotid artery, from which there was excellent backbleeding, and clamped it.  Then I removed the shunt from the common carotid artery, from which there was excellent antegrade bleeding, and then  clamped it.  At this point, I allowed the external carotid artery to backbleed, which was excellent.  Then I instilled heparinized saline in this patched artery, follow by dextran and then completed the patch angioplasty in the usual fashion.  First, I released the clamp on the external carotid artery, then I released it on the common carotid artery.  After waiting a few seconds, I then released it on the internal carotid artery.  I then interrogated this patient's arteries with the continuous Doppler.  The audible waveforms in each artery were consistent with the expected characteristics for each artery.  The Sonosite probe was then sterilely draped and used to interrogate the carotid artery in both longitudinal and transverse views.  At this point, I washed out the wound, and placed thrombin and Gelfoam throughout.  I also gave the patient 50 mg of protamine to reverse his anticoagulation. The ICA kink appreciated prior to patch plasty was no longer present  After waiting a few minutes, I removed the thrombin and Gelfoam and washed out the wound.  There was no more active bleeding in the surgical site.   I then reapproximated the platysma muscle with a running stitch of 3-0 Vicryl.  The  skin was then reapproximated with a running subcuticular 4-0 Monocryl stitch.  The skin was then cleaned, dried and Dermabond was used to reinforce the skin closure.  The patient woke without any problems, neurologically intact.      Given the complexity of the case,  the assistant was necessary in order to expedient the procedure and safely perform the technical aspects of the operation.  The assistant provided traction and countertraction to assist with exposure of the common carotid artery, external carotid artery, and internal carotid artery.  They also assisted with suture ligatures and dividing the facial vein and multiple small venous branches tethering the hypoglossal nerve. The assistant also played a critical role in  placing the shunt safely.  In addition they were necessary to provide adequate traction and countertraction to perform a precise endarterectomy and precise closure.  These skills, especially following the Prolene suture for the anastomosis, could not have been adequately performed by a scrub tech assistant.    Macie Burows, MD [facility]  DATE OF DICTATION:   12/02/2021

## 2021-12-02 NOTE — Plan of Care (Signed)

## 2021-12-02 NOTE — Progress Notes (Signed)
Patient is diagnosed Type 2 Diabetic. Patient reports that her endocrinologist told her that she no longer produces insulin. Patient did not take any Diabetic medications this morning, and has not taken [place] since yesterday morning. CBG 152 upon arrival to short stay. Patient states that she is very sensitive to insulin. Dr. Fransisco Beau notified, and sensitive scale ordered per verbal order. Patient notified.

## 2021-12-02 NOTE — Progress Notes (Signed)
Pt arrived to 4E from PACU s/p L carotid endarterectomy. CHG bath given. VSS. A&Ox4. NIH score 0. Incision c/d/I. Tele applied; CCMD called. Pt oriented to room and call light in reach.   Raelyn Number, RN

## 2021-12-02 NOTE — Anesthesia Procedure Notes (Signed)
Arterial Line Insertion Start/End5/22/2023 10:20 AM, 12/02/2021 10:34 AM Performed by: Audry Pili, MD, anesthesiologist  Patient location: Pre-op. Preanesthetic checklist: patient identified, IV checked, risks and benefits discussed, surgical consent, monitors and equipment checked, pre-op evaluation, timeout performed and anesthesia consent Lidocaine 1% used for infiltration and patient sedated Right, radial was placed Catheter size: 20 G Hand hygiene performed   Attempts: 3 (Previous attempts by CRNA unsuccessful on left radial artery. Dr. Fransisco Beau successful on first attempt on right radial artery) Procedure performed using ultrasound guided technique. Ultrasound Notes:anatomy identified, needle tip was noted to be adjacent to the nerve/plexus identified, no ultrasound evidence of intravascular and/or intraneural injection and image(s) printed for medical record Following insertion, dressing applied and Biopatch. Post procedure assessment: unchanged and normal  Post procedure complications: second provider assisted. Patient tolerated the procedure well with no immediate complications.

## 2021-12-02 NOTE — Anesthesia Preprocedure Evaluation (Addendum)
Anesthesia Evaluation  Patient identified by MRN, date of birth, ID band Patient awake    Reviewed: Allergy & Precautions, NPO status , Patient's Chart, lab work & pertinent test results  History of Anesthesia Complications Negative for: history of anesthetic complications  Airway Mallampati: II  TM Distance: >3 FB Neck ROM: Full    Dental  (+) Dental Advisory Given  Braces :   Pulmonary COPD,  COPD inhaler, Patient abstained from smoking., former smoker,    Pulmonary exam normal        Cardiovascular hypertension, Pt. on medications + Peripheral Vascular Disease  Normal cardiovascular exam   '23 Carotid US - 80-99% left ICAS, 1-39% right ICAS  '23 TTE - EF 60 to 65%. Grade I diastolic dysfunction (impaired relaxation). Trivial mitral valve regurgitation. There is mild dilatation of the aortic root, measuring 38 mm.     Neuro/Psych  Neuromuscular disease CVA, Residual Symptoms negative psych ROS   GI/Hepatic negative GI ROS, Neg liver ROS,   Endo/Other  diabetes, Type 2, Insulin DependentHypothyroidism  Na 130 K 3.3   Renal/GU Renal InsufficiencyRenal disease     Musculoskeletal  (+) Arthritis ,   Abdominal   Peds  Hematology  (+) Blood dyscrasia, anemia ,  On plavix    Anesthesia Other Findings   Reproductive/Obstetrics                            Anesthesia Physical Anesthesia Plan  ASA: 3  Anesthesia Plan: General   Post-op Pain Management: Tylenol PO (pre-op)*   Induction: Intravenous  PONV Risk Score and Plan: 3 and Treatment may vary due to age or medical condition, Ondansetron, Dexamethasone and Midazolam  Airway Management Planned: Oral ETT  Additional Equipment: Arterial line  Intra-op Plan:   Post-operative Plan: Extubation in OR  Informed Consent: I have reviewed the patients History and Physical, chart, labs and discussed the procedure including the risks,  benefits and alternatives for the proposed anesthesia with the patient or authorized representative who has indicated his/her understanding and acceptance.     Dental advisory given  Plan Discussed with: CRNA and Anesthesiologist  Anesthesia Plan Comments:        Anesthesia Quick Evaluation

## 2021-12-02 NOTE — Transfer of Care (Signed)
Immediate Anesthesia Transfer of Care Note  Patient: Michelle Nicholson  Procedure(s) Performed: LEFT CAROTID ENDARTERECTOMY (Left: Neck) PATCH ANGIOPLASTY USING XENOSURE BIOLOGIC 1cm x 6cm PATCH (Left: Neck)  Patient Location: PACU  Anesthesia Type:General  Level of Consciousness: awake, alert  and oriented  Airway & Oxygen Therapy: Patient Spontanous Breathing and Patient connected to face mask oxygen  Post-op Assessment: Report given to RN and Post -op Vital signs reviewed and stable  Post vital signs: Reviewed and stable  Last Vitals:  Vitals Value Taken Time  BP 106/53 12/02/21 1526  Temp    Pulse 62 12/02/21 1530  Resp 13 12/02/21 1530  SpO2 90 % 12/02/21 1530  Vitals shown include unvalidated device data.  Last Pain:  Vitals:   12/02/21 0909  TempSrc:   PainSc: 0-No pain         Complications: No notable events documented.

## 2021-12-02 NOTE — Discharge Instructions (Signed)
   [facility]  Discharge Instructions   Carotid Surgery  Please refer to the following instructions for your post-procedure care. Your surgeon or physician assistant will discuss any changes with you.  Activity  You are encouraged to walk as much as you can. You can slowly return to normal activities but must avoid strenuous activity and heavy lifting until your doctor tell you it's okay. Avoid activities such as vacuuming or swinging a golf club. You can drive after one week if you are comfortable and you are no longer taking prescription pain medications. It is normal to feel tired for serval weeks after your surgery. It is also normal to have difficulty with sleep habits, eating, and bowel movements after surgery. These will go away with time.  Bathing/Showering  Shower daily after you go home. Do not soak in a bathtub, hot tub, or swim until the incision heals completely.  Incision Care  Shower every day. Clean your incision with mild soap and water. Pat the area dry with a clean towel. You do not need a bandage unless otherwise instructed. Do not apply any ointments or creams to your incision. You may have skin glue on your incision. Do not peel it off. It will come off on its own in about one week. Your incision may feel thickened and raised for several weeks after your surgery. This is normal and the skin will soften over time.   For Men Only: It's okay to shave around the incision but do not shave the incision itself for 2 weeks. It is common to have numbness under your chin that could last for several months.  Diet  Resume your normal diet. There are no special food restrictions following this procedure. A low fat/low cholesterol diet is recommended for all patients with vascular disease. In order to heal from your surgery, it is CRITICAL to get adequate nutrition. Your body requires vitamins, minerals, and protein. Vegetables are the best source of  vitamins and minerals. Vegetables also provide the perfect balance of protein. Processed food has little nutritional value, so try to avoid this.  Medications  Resume taking all of your medications unless your doctor or physician assistant tells you not to. If your incision is causing pain, you may take over-the- counter pain relievers such as acetaminophen (Tylenol). If you were prescribed a stronger pain medication, please be aware these medications can cause nausea and constipation. Prevent nausea by taking the medication with a snack or meal. Avoid constipation by drinking plenty of fluids and eating foods with a high amount of fiber, such as fruits, vegetables, and grains.   Do not take Tylenol if you are taking prescription pain medications.  Follow Up  Our office will schedule a follow up appointment 2-3 weeks following discharge.  Please call us immediately for any of the following conditions  . Increased pain, redness, drainage (pus) from your incision site. . Fever of 101 degrees or higher. . If you should develop stroke (slurred speech, difficulty swallowing, weakness on one side of your body, loss of vision) you should call 911 and go to the nearest emergency room. .  Reduce your risk of vascular disease:  . Stop smoking. If you would like help call [facility] at 1-800-QUIT-NOW (1-800-784-8669) or [place] at [phone]. . Manage your cholesterol . Maintain a desired weight . Control your diabetes . Keep your blood pressure down .  If you have any questions, please call the office at [phone]. 

## 2021-12-02 NOTE — Anesthesia Procedure Notes (Signed)
Procedure Name: Intubation Date/Time: 12/02/2021 12:17 PM Performed by: Genelle Bal, CRNA Pre-anesthesia Checklist: Patient identified, Emergency Drugs available, Suction available and Patient being monitored Patient Re-evaluated:Patient Re-evaluated prior to induction Oxygen Delivery Method: Circle system utilized Preoxygenation: Pre-oxygenation with 100% oxygen Induction Type: IV induction Ventilation: Mask ventilation without difficulty Laryngoscope Size: Miller and 2 Grade View: Grade I Tube type: Oral Tube size: 7.0 mm Number of attempts: 1 Airway Equipment and Method: Stylet Placement Confirmation: ETT inserted through vocal cords under direct vision, positive ETCO2 and breath sounds checked- equal and bilateral Secured at: 22 cm Tube secured with: Tape Dental Injury: Teeth and Oropharynx as per pre-operative assessment

## 2021-12-02 NOTE — Progress Notes (Signed)
  Day of Surgery Note    Subjective:  sleepy but awakes and follows commands   Vitals:   12/02/21 1556 12/02/21 1614  BP: (!) 109/47 (!) 113/42  Pulse: 60 (!) 58  Resp: 15 14  Temp:    SpO2: 94% 98%    Incisions:   clean and dry with mild fullness Neuro:  moving all extremities equally and tongue is midline Lungs:  non labored    Assessment/Plan:  This is a 61 y.o. female who is s/p  Left carotid endarterectomy  -pt doing well in recovery and neuro in tact.   -to [place] later today.      Leontine Locket, PA-C 12/02/2021 4:15 PM [phone]

## 2021-12-02 NOTE — Consult Note (Signed)
[facility] CONSULT NOTE   Patient seen and examined in preop holding.  No complaints. No changes to medication history or physical exam since last seen. After discussing the risks and benefits of left-sided carotid endarterectomy for symptomatic ICA stenosis, Michelle Nicholson elected to proceed.   Broadus John MD   MRN : [id number]  Reason for Consult: left ICA high grade stenosis Referring Physician:   History of Present Illness: 61 y/o female presented to the ED with progressive worsening balance.  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    MRI of the brain with and without contrast shows small acute cortically based infarct.  We have been asked to consult for left ICA stenosis in the setting of a new ischemic brain event.  She denies weakness in her extremities, amaurosis or aphasia.   Past medical history includes: diabetes, hypertension, neuropathy,      Current Facility-Administered Medications  Medication Dose Route Frequency Provider Last Rate Last Admin   0.9 %  sodium chloride infusion   Intravenous Continuous Broadus John, MD 10 mL/hr at 12/02/21 1038 New Bag at 12/02/21 1038   ceFAZolin (ANCEF) 2-4 GM/100ML-% IVPB            ceFAZolin (ANCEF) IVPB 2g/100 mL premix  2 g Intravenous 30 min Pre-Op Broadus John, MD       Chlorhexidine Gluconate Cloth 2 % PADS 6 each  6 each Topical Once Broadus John, MD       And   Chlorhexidine Gluconate Cloth 2 % PADS 6 each  6 each Topical Once Broadus John, MD       insulin aspart (novoLOG) injection 0-7 Units  0-7 Units Subcutaneous Q2H PRN Audry Pili, MD       lactated ringers infusion   Intravenous Continuous Audry Pili, MD 10 mL/hr at 12/02/21 1037 Continued from Pre-op at 12/02/21 1037   remifentanil (ULTIVA) 2 mg in 100 mL normal saline (20 mcg/mL) Optime  0.15 mcg/kg/min Intravenous Continuous Genelle Bal, CRNA        Pt  meds include: Statin :yes Betablocker: No ASA: Yes Other anticoagulants/antiplatelets: Plavix  Past Medical History:  Diagnosis Date   Anemia    Arthritis    Cancer ([place])    skin ca, squamous, basal   COPD (chronic obstructive pulmonary disease) ([place])    Diabetes mellitus    Dyspnea    Family history of adverse reaction to anesthesia    `mother - N/V, Brother N/V   Hypertension    Hypothyroidism    Neuropathy    Pancreas disorder    Peripheral vascular disease ([place])    Pneumonia    04/2021, 2003   Stroke ([place]) 2017   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, equilibrium    Past Surgical History:  Procedure Laterality Date   CARDIAC CATHETERIZATION      Social History Social History   Tobacco Use   Smoking status: Former    Types: Cigarettes    Quit date: 11/27/2021    Years since quitting: 0.0   Smokeless tobacco: Never  Vaping Use   Vaping Use: Never used  Substance Use Topics   Alcohol use: No    Comment: 2 drinks a month   Drug use: No    Family History Family History  Problem Relation Age of Onset   Cancer Mother    Hypertension Father    Diabetes Father  Cancer Sister    Diabetes Sister    Diabetes Brother    Kidney failure Brother    CAD Brother    Diabetes Other     Allergies  Allergen Reactions   Dorzolamide Hcl-Timolol Mal Itching and Other (See Comments)    Makes her eyes burn Conjunctival Injection with Burning Visual Acuity Blurry    Brimonidine Rash and Other (See Comments)    Follicular conjunctivitis   Netarsudil-Latanoprost Other (See Comments)    Redness, tearing, burning     REVIEW OF SYSTEMS  General: '[ ]'$  Weight loss, '[ ]'$  Fever, '[ ]'$  chills Neurologic: '[ ]'$  Dizziness, '[ ]'$  Blackouts, '[ ]'$  Seizure '[ ]'$  Stroke, '[ ]'$  "Mini stroke", '[ ]'$  Slurred speech, '[ ]'$  Temporary blindness; '[ ]'$  weakness in arms or legs, '[ ]'$  Hoarseness '[ ]'$  Dysphagia Cardiac: '[ ]'$  Chest pain/pressure, '[ ]'$  Shortness of breath at rest '[ ]'$  Shortness of breath with exertion, [  ] Atrial fibrillation or irregular heartbeat  Vascular: '[ ]'$  Pain in legs with walking, '[ ]'$  Pain in legs at rest, '[ ]'$  Pain in legs at night,  '[ ]'$  Non-healing ulcer, '[ ]'$  Blood clot in vein/DVT,   Pulmonary: '[ ]'$  Home oxygen, '[ ]'$  Productive cough, '[ ]'$  Coughing up blood, '[ ]'$  Asthma,  '[ ]'$  Wheezing '[ ]'$  COPD Musculoskeletal:  '[ ]'$  Arthritis, '[ ]'$  Low back pain, '[ ]'$  Joint pain Hematologic: '[ ]'$  Easy Bruising, '[ ]'$  Anemia; '[ ]'$  Hepatitis Gastrointestinal: '[ ]'$  Blood in stool, '[ ]'$  Gastroesophageal Reflux/heartburn, Urinary: '[ ]'$  chronic Kidney disease, '[ ]'$  on HD - '[ ]'$  MWF or '[ ]'$  TTHS, '[ ]'$  Burning with urination, '[ ]'$  Difficulty urinating Skin: '[ ]'$  Rashes, '[ ]'$  Wounds Psychological: '[ ]'$  Anxiety, '[ ]'$  Depression  Physical Examination Vitals:   12/02/21 0849 12/02/21 0858  BP: (!) 113/44 115/62  Pulse: 62   Resp: 18   TempSrc: Oral   SpO2: 100%   Weight: 68 kg   Height: '5\' 9"'$  (1.753 m)    Body mass index is 22.15 kg/m.  General:  WDWN in NAD Gait: Normal HENT: WNL Eyes: Pupils equal Pulmonary: normal non-labored breathing , without Rales, rhonchi,  wheezing Cardiac: RRR, without  Murmurs, rubs or gallops; No carotid bruits Abdomen: soft, NT, no masses Skin: no rashes, ulcers noted;  no Gangrene , no cellulitis; no open wounds;   Vascular Exam/Pulses:palpable radial pulse B   Musculoskeletal: no muscle wasting or atrophy; no edema  Neurologic: A&O X 3; Appropriate Affect ;  SENSATION: normal; MOTOR FUNCTION: 5/5 Symmetric Speech is fluent/normal   Significant Diagnostic Studies: CBC Lab Results  Component Value Date   WBC 6.4 12/02/2021   HGB 10.7 (L) 12/02/2021   HCT 31.8 (L) 12/02/2021   MCV 92.2 12/02/2021   PLT 197 12/02/2021    BMET    Component Value Date/Time   NA 135 12/02/2021 0949   K 4.0 12/02/2021 0949   CL 108 12/02/2021 0949   CO2 19 (L) 12/02/2021 0949   GLUCOSE 140 (H) 12/02/2021 0949   BUN 21 12/02/2021 0949   CREATININE 1.13 (H) 12/02/2021 0949   CALCIUM  9.2 12/02/2021 0949   GFRNONAA 55 (L) 12/02/2021 0949   GFRAA >60 04/02/2019 2338   Estimated Creatinine Clearance: 53.9 mL/min (A) (by C-G formula based on SCr of 1.13 mg/dL (H)).  COAG Lab Results  Component Value Date   INR 1.0 12/02/2021   INR 1.2 04/21/2021     Non-Invasive  Vascular Imaging:  MRI   IMPRESSION: 1. Small acute cortically based infarct in the left anterior motor strip near the right upper extremity representation area. And nearby more subacute appearing white matter small vessel ischemia in the posterior left corona radiata. No associated hemorrhage or mass effect.   2. Positive also for indeterminate punctate abnormal enhancement in the anterior left frontal lobe (series 10, image 31), new since 2017. In light of #1, perhaps this is enhancement of a late subacute small vessel infarct. But a repeat Brain MRI without and with contrast is recommended in 3 months to re-evaluate.   3. Chronically advanced signal changes in the pons, nonspecific but favor small vessel disease related in this setting. Mild to moderate for age cerebral white matter signal changes have also progressed since 2017.  CTA neck IMPRESSION: 1. Negative for large vessel occlusion.   2. Positive for advanced atherosclerosis in the neck and at the skull base: - soft and calcified Left Carotid atherosclerosis in the neck with bulky plaque at the Left ICA origin and high-grade RADIOGRAPHIC STRING SIGN stenosis. - heavily calcified ICA siphons. Moderate to Severe Left anterior genu and moderate Right cavernous ICA siphon stenoses. - Only mild vertebral artery stenosis.   3. Aortic Atherosclerosis (ICD10-I70.0).    ASSESSMENT/PLAN:  Left ICA sever high grade stenosis"string sign" on CTA in the setting of stroke symptoms to include: worsening balance with history of CVA event in 2017.     She will require intervention of the left ICA open endarterectomy verses TCAR.  Dr. Virl Cagey will  review and examine the patient to determine the best course of intervention.     Broadus John 12/02/2021 11:30 AM   Addendum:   In short, Michelle Nicholson is a 61 year old female with longstanding history of smoking with waxing and waning episodes of dizziness over the last several years who has had previous diagnoses of Mnire's disease as well as vertigo.  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  Imaging demonstrates greater than 80% stenosis of the left internal carotid artery.  I had a long discussion with Michelle Nicholson and her daughter regarding symptomatic carotid artery stenosis and notably the reduction in stroke rate with revascularization from 26% to 9% over the course of 2 years. We discussed both transcarotid artery revascularization as well as carotid endarterectomy.  We discussed the risks and benefits of each.  I asked Michelle Nicholson to consider her options overnight, and that we would have further discussion in the morning.  Michelle Nicholson is aware, that treatment is to reduce the risk of future stroke, and would likely not affect/improve current symptoms.  She is still has some residual dizziness.  Michelle Nicholson is followed by Dr. Nyoka Cowden in [facility] who has performed balloon angioplasty to bilateral lower extremities.  I offered to relay this information to him should she choose to pursue carotid revascularization in [facility].  Please continue current medical therapy being dual antiplatelet, high intensity statin We discussed smoking cessation  Would appreciate cardiology consult for preop risk assessment as pt had notable labored breathing with conversation and stated she would have difficulty climbing a flight of stairs with out significant SOB.   I will see her in the morning to finalize surgical plans versus follow-up.   Broadus John

## 2021-12-03 ENCOUNTER — Encounter (HOSPITAL_COMMUNITY): Payer: Self-pay | Admitting: Vascular Surgery

## 2021-12-03 LAB — CBC
HCT: 27.3 % — ABNORMAL LOW (ref 36.0–46.0)
Hemoglobin: 9.3 g/dL — ABNORMAL LOW (ref 12.0–15.0)
MCH: 31.5 pg (ref 26.0–34.0)
MCHC: 34.1 g/dL (ref 30.0–36.0)
MCV: 92.5 fL (ref 80.0–100.0)
Platelets: 176 10*3/uL (ref 150–400)
RBC: 2.95 MIL/uL — ABNORMAL LOW (ref 3.87–5.11)
RDW: 13.9 % (ref 11.5–15.5)
WBC: 6.8 10*3/uL (ref 4.0–10.5)
nRBC: 0 % (ref 0.0–0.2)

## 2021-12-03 LAB — BASIC METABOLIC PANEL
Anion gap: 5 (ref 5–15)
BUN: 18 mg/dL (ref 8–23)
CO2: 18 mmol/L — ABNORMAL LOW (ref 22–32)
Calcium: 8.3 mg/dL — ABNORMAL LOW (ref 8.9–10.3)
Chloride: 113 mmol/L — ABNORMAL HIGH (ref 98–111)
Creatinine, Ser: 1.03 mg/dL — ABNORMAL HIGH (ref 0.44–1.00)
GFR, Estimated: >60 mL/min (ref >60)
Glucose, Bld: 220 mg/dL — ABNORMAL HIGH (ref 70–99)
Potassium: 4.5 mmol/L (ref 3.5–5.1)
Sodium: 136 mmol/L (ref 135–145)

## 2021-12-03 LAB — GLUCOSE, CAPILLARY
Glucose-Capillary: 261 mg/dL — ABNORMAL HIGH (ref 70–99)
Glucose-Capillary: 150 mg/dL — ABNORMAL HIGH (ref 70–99)

## 2021-12-03 MED ORDER — OXYCODONE-ACETAMINOPHEN 5-325 MG PO TABS
1.0000 | ORAL_TABLET | Freq: Four times a day (QID) | ORAL | 0 refills | Status: DC | PRN
Start: 2021-12-03 — End: 2022-11-07

## 2021-12-03 NOTE — TOC Transition Note (Signed)
Transition of Care (TOC) - CM/SW Discharge Note Marvetta Gibbons RN, BSN Transitions of Care Unit 4E- RN Case Manager See Treatment Team for direct phone #    Patient Details  Name: Michelle Nicholson MRN: [id number] Date of Birth: 1960/11/28  Transition of Care [facility]) CM/SW Contact:  Dawayne Patricia, RN Phone Number: 12/03/2021, 11:36 AM   Clinical Narrative:    Pt stable for transition home today, s/p CEA. Transition of Care Department [facility]) has reviewed patient and no TOC needs have been identified at this time.   Final next level of care: Home/Self Care Barriers to Discharge: No Barriers Identified   Patient Goals and CMS Choice    N/A    Discharge Placement                 Home      Discharge Plan and Services                DME Arranged: N/A DME Agency: NA       HH Arranged: NA HH Agency: NA        Social Determinants of Health (SDOH) Interventions     Readmission Risk Interventions    12/03/2021   11:36 AM  Readmission Risk Prevention Plan  Transportation Screening Complete  PCP or Specialist Appt within 3-5 Days Complete  Social Work Consult for [place] Planning/Counseling Complete  Palliative Care Screening Not Applicable  Medication Review [employer]) Complete

## 2021-12-03 NOTE — Progress Notes (Signed)
PHARMACIST LIPID MONITORING   Michelle Nicholson is a 61 y.o. female admitted on 12/02/2021 with L carotid endarterectomy.  Pharmacy has been consulted to optimize lipid-lowering therapy with the indication of secondary prevention for clinical ASCVD.  Recent Labs:  Lipid Panel (last 6 months):   Lab Results  Component Value Date   CHOL 85 11/28/2021   TRIG 68 11/28/2021   HDL 31 (L) 11/28/2021   CHOLHDL 2.7 11/28/2021   VLDL 14 11/28/2021   LDLCALC 40 11/28/2021    Hepatic function panel (last 6 months):   Lab Results  Component Value Date   AST 21 12/02/2021   ALT 15 12/02/2021   ALKPHOS 88 12/02/2021   BILITOT 0.6 12/02/2021    SCr (since admission):   Serum creatinine: 1.03 mg/dL (H) 12/03/21 0429 Estimated creatinine clearance: 59.2 mL/min (A)  Current therapy and lipid therapy tolerance Current lipid-lowering therapy: atorvastatin '40mg'$   Previous lipid-lowering therapies (if applicable): n/a Documented or reported allergies or intolerances to lipid-lowering therapies (if applicable): none  Assessment:   Currently on atorvastatin '40mg'$  prior to admission and continued. LDL 40 on 11/28/2021. Continue current therapy   Plan:    1.Statin intensity (high intensity recommended for all patients regardless of the LDL):  No statin changes. The patient is already on a high intensity statin.  2.Add ezetimibe (if any one of the following):   Not indicated at this time.  3.Refer to lipid clinic:   No  4.Follow-up with:  Primary care provider - Jonathon Bellows, PA-C  5.Follow-up labs after discharge:  No changes in lipid therapy, repeat a lipid panel in one year.       Cristela Felt, PharmD, BCPS Clinical Pharmacist 12/03/2021 8:57 AM

## 2021-12-03 NOTE — Progress Notes (Signed)
Mobility Specialist: Progress Note   12/03/21 1156  Mobility  Activity Ambulated with assistance in room  Level of Assistance Contact guard assist, steadying assist  Assistive Device Other (Comment) (HHA)  Distance Ambulated (ft) 120 ft  Activity Response Tolerated well  $Mobility charge 1 Mobility   Pre-Mobility:     Supine: 56 HR, 102/49 (66) BP, 96% SpO2    Standing: 76/46 (56) BP    Standing 2 min: 86/44 (58) BP Post-Mobility:     Standing: 94/47 (58) BP    Supine: 102/42 (61) BP  Pt received in the bed and agreeable to ambulation. BP dropped upon standing but pt denied any symptoms. Pt assisted to the BR per request and ambulated in the room. C/o feeling weak after ambulating in the room. Pt assisted back to bed with call bell and phone in reach. Bed alarm is on. RN notified of pts BP.   [facility] Michelle Nicholson Mobility Specialist Mobility Specialist 5 [address]: [phone] Mobility Specialist 6 [address]: [phone]

## 2021-12-03 NOTE — Discharge Summary (Signed)
Discharge Summary     Michelle Nicholson 11/30/1960 61 y.o. female  [id number]  Admission Date: 12/02/2021  Discharge Date: 12/03/2021  Physician: Broadus John, MD  Admission Diagnosis: Carotid artery stenosis [I65.29]   HPI:   This is a 61 y.o. female presented to the ED with progressive worsening balance.  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    MRI of the brain with and without contrast shows small acute cortically based infarct.  We have been asked to consult for left ICA stenosis in the setting of a new ischemic brain event.   She denies weakness in her extremities, amaurosis or aphasia.   Past medical history includes: diabetes, hypertension, neuropathy,   Hospital Course:  The patient was admitted to the hospital and taken to the operating room on 12/02/2021 and underwent left CEA    Findings: Greater than 80% stenosis of the internal carotid artery.  Kinking appreciated in the proximal internal carotid artery  The pt tolerated the procedure well and was transported to the PACU in good condition.   By POD 1, the pt neuro status was in tact.  She did have a sore throat but no difficulty swallowing.  She was ambulating and voiding and pain well controlled.  She is discharged home.    Recent Labs    12/02/21 0949 12/03/21 0429  NA 135 136  K 4.0 4.5  CL 108 113*  CO2 19* 18*  GLUCOSE 140* 220*  BUN 21 18  CALCIUM 9.2 8.3*   Recent Labs    12/02/21 0949 12/03/21 0429  WBC 6.4 6.8  HGB 10.7* 9.3*  HCT 31.8* 27.3*  PLT 197 176   Recent Labs    12/02/21 0949  INR 1.0     Discharge Instructions     Discharge patient   Complete by: As directed    Discharge after pt has eaten, ambulated in the halls and voided.  Thanks   Discharge disposition: 01-Home or Self Care   Discharge patient date: 12/03/2021       Discharge Diagnosis:  Carotid artery stenosis [I65.29]  Secondary Diagnosis: Patient Active  Problem List   Diagnosis Date Noted   Carotid artery stenosis 12/02/2021   Pyuria 11/29/2021   Preop cardiovascular exam    Acute ischemic stroke ([place]) 11/28/2021   Recurrent UTI 11/28/2021   Internal carotid artery stenosis, left 11/28/2021   Hypokalemia 11/28/2021   Dyslipidemia 11/28/2021   Tobacco abuse 11/28/2021   Fatigue 11/28/2021   Severe sepsis ([place]) 04/21/2021   Shortness of breath 04/21/2021   Acute respiratory failure with hypoxia ([place]) 04/21/2021   Chronic hyponatremia 04/21/2021   AKI (acute kidney injury) ([place]) 04/21/2021   Pneumonia 04/21/2021   Bilateral pneumonia 04/20/2021   Uncontrolled type 2 diabetes mellitus with hyperglycemia, with long-term current use of insulin ([place]) 12/09/2015   Essential hypertension 12/09/2015   Meniere syndrome 12/09/2015   Past Medical History:  Diagnosis Date   Anemia    Arthritis    Cancer ([place])    skin ca, squamous, basal   COPD (chronic obstructive pulmonary disease) (HCC)    Diabetes mellitus    Dyspnea    Family history of adverse reaction to anesthesia    `mother - N/V, Brother N/V   Hypertension    Hypothyroidism    Neuropathy    Pancreas disorder    Peripheral vascular disease ([place])    Pneumonia    04/2021, 2003   Stroke ([place])  2017   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, equilibrium    Allergies as of 12/03/2021       Reactions   Dorzolamide Hcl-timolol Mal Itching, Other (See Comments)   Makes her eyes burn Conjunctival Injection with Burning Visual Acuity Blurry   Brimonidine Rash, Other (See Comments)   Follicular conjunctivitis   Netarsudil-latanoprost Other (See Comments)   Redness, tearing, burning        Medication List     TAKE these medications    acetaminophen 500 MG tablet Commonly known as: TYLENOL Take 1,000 mg by mouth daily as needed for headache (pain).   albuterol 108 (90 Base) MCG/ACT inhaler Commonly known as: VENTOLIN HFA Inhale 1-2 puffs into the lungs every 6 (six) hours as  needed for wheezing or shortness of breath.   amLODipine 5 MG tablet Commonly known as: NORVASC Take 5 mg by mouth every morning.   amLODipine 2.5 MG tablet Commonly known as: NORVASC Take 2.5 mg by mouth at bedtime.   aspirin 325 MG tablet Take 1 tablet (325 mg total) by mouth daily.   atorvastatin 40 MG tablet Commonly known as: LIPITOR Take 40 mg by mouth every morning.   buPROPion 150 MG 24 hr tablet Commonly known as: WELLBUTRIN XL Take 150 mg by mouth every morning.   CAL-MAG-ZINC PO Take 1 tablet by mouth every morning.   ciprofloxacin 500 MG tablet Commonly known as: CIPRO Take 500 mg by mouth 2 (two) times daily.   clopidogrel 75 MG tablet Commonly known as: PLAVIX Take 1 tablet (75 mg total) by mouth daily.   estradiol 0.1 MG/GM vaginal cream Commonly known as: ESTRACE Place 1 Applicatorful vaginally at bedtime.   FeroSul 325 (65 FE) MG tablet Generic drug: ferrous sulfate Take 1 tablet (325 mg total) by mouth 2 (two) times daily with a meal. What changed: when to take this   fluorouracil 5 % cream Commonly known as: EFUDEX Apply 1 application. topically daily as needed (skin cancer breakouts).   fluticasone-salmeterol 100-50 MCG/ACT Aepb Commonly known as: ADVAIR Inhale 1 puff into the lungs daily as needed (shortness of breath).   folic acid 1 MG tablet Commonly known as: FOLVITE Take 1 mg by mouth every morning.   insulin aspart 100 UNIT/ML FlexPen Commonly known as: NOVOLOG Inject 4 Units into the skin See admin instructions. Inject 4 units subcutaneously three times daily after meals - plus sliding scale adjustment - add 1 unit for every 50 units over 200   insulin degludec 100 UNIT/ML FlexTouch Pen Commonly known as: Tyler Aas FlexTouch Inject 7 Units into the skin daily. What changed:  how much to take when to take this   ipratropium-albuterol 0.5-2.5 (3) MG/3ML Soln Commonly known as: DUONEB Take 3 mLs by nebulization every 6 (six)  hours as needed for up to 15 days.   latanoprost 0.005 % ophthalmic solution Commonly known as: XALATAN Place 1 drop into both eyes at bedtime.   levothyroxine 50 MCG tablet Commonly known as: SYNTHROID Take 50 mcg by mouth daily before breakfast.   losartan 50 MG tablet Commonly known as: COZAAR Take 50 mg by mouth every morning.   methazolamide 50 MG tablet Commonly known as: NEPTAZANE Take 50 mg by mouth 2 (two) times daily.   multivitamin with minerals Tabs tablet Take 1 tablet by mouth every morning.   nicotine 21 mg/24hr patch Commonly known as: NICODERM CQ - dosed in mg/24 hours Place 1 patch (21 mg total) onto the skin daily.  oxybutynin 10 MG 24 hr tablet Commonly known as: DITROPAN-XL Take 10 mg by mouth every morning.   oxyCODONE-acetaminophen 5-325 MG tablet Commonly known as: Percocet Take 1 tablet by mouth every 6 (six) hours as needed for severe pain.   Pancrelipase (Lip-Prot-Amyl) 24000-76000 units Cpep Take 2 capsules by mouth daily after breakfast.   pregabalin 300 MG capsule Commonly known as: LYRICA Take 300 mg by mouth 2 (two) times daily.   sertraline 100 MG tablet Commonly known as: ZOLOFT Take 100 mg by mouth every morning.   sodium chloride 1 g tablet Take 1 g by mouth 3 (three) times daily as needed (brain fog from sodium insufficiency).   trimethoprim 100 MG tablet Commonly known as: TRIMPEX Take 100 mg by mouth every morning.   VITAMIN B-12 PO Take 1 tablet by mouth every morning.   Vitamin D (Ergocalciferol) 1.25 MG (50000 UNIT) Caps capsule Commonly known as: DRISDOL Take 50,000 Units by mouth every Sunday.   VITAMIN E PO Take 1 capsule by mouth every morning.         [facility] Discharge Instructions Carotid Endarterectomy (CEA)  Please refer to the following instructions for your post-procedure care. Your surgeon or physician assistant will discuss any changes with  you.  Activity  You are encouraged to walk as much as you can. You can slowly return to normal activities but must avoid strenuous activity and heavy lifting until your doctor tell you it's OK. Avoid activities such as vacuuming or swinging a golf club. You can drive after one week if you are comfortable and you are no longer taking prescription pain medications. It is normal to feel tired for serval weeks after your surgery. It is also normal to have difficulty with sleep habits, eating, and bowel movements after surgery. These will go away with time.  Bathing/Showering  You may shower after you come home. Do not soak in a bathtub, hot tub, or swim until the incision heals completely.  Incision Care  Shower every day. Clean your incision with mild soap and water. Pat the area dry with a clean towel. You do not need a bandage unless otherwise instructed. Do not apply any ointments or creams to your incision. You may have skin glue on your incision. Do not peel it off. It will come off on its own in about one week. Your incision may feel thickened and raised for several weeks after your surgery. This is normal and the skin will soften over time. For Men Only: It's OK to shave around the incision but do not shave the incision itself for 2 weeks. It is common to have numbness under your chin that could last for several months.  Diet  Resume your normal diet. There are no special food restrictions following this procedure. A low fat/low cholesterol diet is recommended for all patients with vascular disease. In order to heal from your surgery, it is CRITICAL to get adequate nutrition. Your body requires vitamins, minerals, and protein. Vegetables are the best source of vitamins and minerals. Vegetables also provide the perfect balance of protein. Processed food has little nutritional value, so try to avoid this.  Medications  Resume taking all of your medications unless your doctor or physician  assistant tells you not to.  If your incision is causing pain, you may take over-the- counter pain relievers such as acetaminophen (Tylenol). If you were prescribed a stronger pain medication, please be aware these medications can cause nausea and  constipation.  Prevent nausea by taking the medication with a snack or meal. Avoid constipation by drinking plenty of fluids and eating foods with a high amount of fiber, such as fruits, vegetables, and grains.  Do not take Tylenol if you are taking prescription pain medications.  Follow Up  Our office will schedule a follow up appointment 2-3 weeks following discharge.  Please call us immediately for any of the following conditions  Increased pain, redness, drainage (pus) from your incision site. Fever of 101 degrees or higher. If you should develop stroke (slurred speech, difficulty swallowing, weakness on one side of your body, loss of vision) you should call 911 and go to the nearest emergency room.  Reduce your risk of vascular disease:  Stop smoking. If you would like help call [facility] at 1-800-QUIT-NOW 289-688-1870) or [place] at [phone]. Manage your cholesterol Maintain a desired weight Control your diabetes Keep your blood pressure down  If you have any questions, please call the office at [phone].  Prescriptions given: 1.   Roxicet #8 No Refill  Disposition: home  Patient's condition: is Good  Follow up: 1. [facility] in 2-3 weeks    Leontine Locket, PA-C [facility] [phone]   --- For [facility] Registry use ---   Modified Rankin score at D/C (0-6): 0  IV medication needed for:  1. Hypertension: No 2. Hypotension: No  Post-op Complications: No  1. Post-op CVA or TIA: No  If yes: Event classification (right eye, left eye, right cortical, left cortical, verterobasilar, other): n/a  If yes: Timing of event (intra-op, <6 hrs post-op, >=6 hrs post-op, unknown): n/a  2. CN injury:  No  If yes: CN n/a injuried   3. Myocardial infarction: No  If yes: Dx by (EKG or clinical, Troponin): n/a  4.  CHF: No  5.  Dysrhythmia (new): No  6. Wound infection: No  7. Reperfusion symptoms: No  8. Return to OR: No  If yes: return to OR for (bleeding, neurologic, other CEA incision, other): n/a  Discharge medications: Statin use:  Yes ASA use:  Yes   Beta blocker use:  No ACE-Inhibitor use:  No  ARB use:  Yes CCB use: Yes P2Y12 Antagonist use: Yes, [x ] Plavix, '[ ]'$  Plasugrel, '[ ]'$  Ticlopinine, '[ ]'$  Ticagrelor, '[ ]'$  Other, '[ ]'$  No for medical reason, '[ ]'$  Non-compliant, '[ ]'$  Not-indicated Anti-coagulant use:  No, '[ ]'$  Warfarin, '[ ]'$  Rivaroxaban, '[ ]'$  Dabigatran,

## 2021-12-03 NOTE — Progress Notes (Signed)
  Day of Surgery Note    Subjective:  sleepy but awakes and follows commands   Vitals:   12/03/21 0500 12/03/21 0600  BP: (!) 128/54 (!) 133/52  Pulse: (!) 54 (!) 58  Resp: 18 14  Temp: (!) 97.5 F (36.4 C) (!) 97.5 F (36.4 C)  SpO2: 95% 95%    Incisions:   clean and dry with mild fullness Neuro:  moving all extremities equally and tongue is midline Lungs:  non labored    Assessment/Plan:  This is a 61 y.o. female who is s/p  Left carotid endarterectomy  -pt doing well in recovery and neuro in tact.   -to [place] later today.    Leontine Locket, PA-C 12/03/2021 7:28 AM [phone]  VASCULAR STAFF ADDENDUM: I have independently interviewed and examined the patient. I agree with the above.  Doing well s/p CEA. Wound looks good. Sensorimotor intact. Home today.  Cassandria Santee, MD [facility] Phone Number: [phone] 12/03/2021 7:29 AM

## 2021-12-10 ENCOUNTER — Other Ambulatory Visit: Payer: Self-pay | Admitting: *Deleted

## 2021-12-10 NOTE — Patient Outreach (Signed)
[facility]) Care Management  12/10/2021  Denisse Whitenack [birth year] [id number]   RED ON EMMI ALERT - Stroke Day # 6 Date: 5/26 Red Alert Reason: Smoked or been around smoke?  YES   Outreach attempt #1, unsuccessful to listed preferred number.  HIPAA compliant voice message left.   Plan: RN CM will follow up within the next 2-3 business days.  Valente David, RN, MSN, [place] Manager [phone]

## 2021-12-10 NOTE — Patient Outreach (Signed)
Received a red flag Emmi stroke notification for Ms. Thede . I have assigned Valente David, RN to call for follow up and determine if there are any Case Management needs.    [place] [facility], [place], [place] Management [phone]

## 2021-12-13 ENCOUNTER — Other Ambulatory Visit: Payer: Self-pay | Admitting: *Deleted

## 2021-12-13 NOTE — Patient Outreach (Signed)
[facility]) Care Management  12/13/2021  Michelle Nicholson April 10, 1961 [id number]   RED ON EMMI ALERT - Stroke Day # 6 Date: 5/26 Red Alert Reason: Smoked or been around smoke?  YES   Outreach attempt #2, successful.  Identity verified.  This care manager introduced self and stated purpose of call.  [facility] care management services explained.    Member report she feels she is recovering well since discharge.  States she has more energy during the first part of the day, has to take a nap in the late afternoon.  Advised this is normal post recovery, verbalizes understanding as she had her first stroke in 2017.  Follow up with vascular surgeon scheduled for 6/16, will know if she will need outpatient rehab at that time.  She is in the process of having medical records sent from hospital admission to her current neurology office, affiliated with [facility].  Once this is done, she will have follow up appointment scheduled.    Report she is working stroke prevention with diet, exercise, and medication regime.  Does admit that it has been hard to stop smoking, still working on cessation.  She is using patch to help.  Denies any urgent concerns, encouraged to contact this care manager with questions.   Plan: RN CM will send stroke recovery education and smoking cessation.  Will follow up within the next 2 weeks.  Valente David, RN, MSN, [place] Manager [phone]

## 2021-12-19 ENCOUNTER — Telehealth (HOSPITAL_COMMUNITY): Payer: Self-pay

## 2021-12-19 NOTE — Telephone Encounter (Signed)
Transitions of Care Pharmacy   Call attempted for a pharmacy transitions of care follow-up. HIPAA appropriate voicemail was left with call back information provided.   Call attempt #2. Will follow-up in 2-3 days.    

## 2021-12-26 ENCOUNTER — Telehealth (HOSPITAL_COMMUNITY): Payer: Self-pay

## 2021-12-26 NOTE — Progress Notes (Signed)
POST OPERATIVE OFFICE NOTE    CC:  F/u for surgery  HPI:  This is a 61 y.o. female who is s/p left CEA  on 12/02/21 by Dr. Virl Cagey.   This was done for symptomatic carotid stenosis with  dizziness for years.   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  Imaging demonstrates greater than 80% stenosis of the left internal carotid artery.  Pt returns today for follow up.  Pt states she has a left lower lip lag and drools at time.  This occurred since surgery.  She also has numbness around the jaw line.  No speech or swallowing difficulties.    Allergies  Allergen Reactions   Dorzolamide Hcl-Timolol Mal Itching and Other (See Comments)    Makes her eyes burn Conjunctival Injection with Burning Visual Acuity Blurry    Brimonidine Rash and Other (See Comments)    Follicular conjunctivitis   Netarsudil-Latanoprost Other (See Comments)    Redness, tearing, burning    Current Outpatient Medications  Medication Sig Dispense Refill   acetaminophen (TYLENOL) 500 MG tablet Take 1,000 mg by mouth daily as needed for headache (pain).     albuterol (VENTOLIN HFA) 108 (90 Base) MCG/ACT inhaler Inhale 1-2 puffs into the lungs every 6 (six) hours as needed for wheezing or shortness of breath.     amLODipine (NORVASC) 2.5 MG tablet Take 2.5 mg by mouth at bedtime.     amLODipine (NORVASC) 5 MG tablet Take 5 mg by mouth every morning.     aspirin 325 MG tablet Take 1 tablet (325 mg total) by mouth daily. 30 tablet 3   atorvastatin (LIPITOR) 40 MG tablet Take 40 mg by mouth every morning.     buPROPion (WELLBUTRIN XL) 150 MG 24 hr tablet Take 150 mg by mouth every morning.     Calcium-Magnesium-Zinc (CAL-MAG-ZINC PO) Take 1 tablet by mouth every morning.     ciprofloxacin (CIPRO) 500 MG tablet Take 500 mg by mouth 2 (two) times daily.     clopidogrel (PLAVIX) 75 MG tablet Take 1 tablet (75 mg total) by mouth daily. 30 tablet 0    Cyanocobalamin (VITAMIN B-12 PO) Take 1 tablet by mouth every morning.     estradiol (ESTRACE) 0.1 MG/GM vaginal cream Place 1 Applicatorful vaginally at bedtime.     ferrous sulfate 325 (65 FE) MG tablet Take 1 tablet (325 mg total) by mouth 2 (two) times daily with a meal. (Patient taking differently: Take 325 mg by mouth every morning.) 60 tablet 0   fluorouracil (EFUDEX) 5 % cream Apply 1 application. topically daily as needed (skin cancer breakouts).     fluticasone-salmeterol (ADVAIR) 100-50 MCG/ACT AEPB Inhale 1 puff into the lungs daily as needed (shortness of breath).     folic acid (FOLVITE) 1 MG tablet Take 1 mg by mouth every morning.     insulin aspart (NOVOLOG) 100 UNIT/ML FlexPen Inject 4 Units into the skin See admin instructions. Inject 4 units subcutaneously three times daily after meals - plus sliding scale adjustment - add 1 unit for every 50 units over 200     Insulin Degludec (TRESIBA FLEXTOUCH) 100 UNIT/ML SOPN Inject 7 Units into the skin daily. (Patient taking differently: Inject 16 Units into the skin daily after breakfast.)     ipratropium-albuterol (DUONEB) 0.5-2.5 (3) MG/3ML SOLN Take 3 mLs by nebulization every 6 (six) hours as needed for up to 15 days. [place]  mL 0   latanoprost (XALATAN) 0.005 % ophthalmic solution Place 1 drop into both eyes at bedtime.     levothyroxine (SYNTHROID) 50 MCG tablet Take 50 mcg by mouth daily before breakfast.     losartan (COZAAR) 50 MG tablet Take 50 mg by mouth every morning.     methazolamide (NEPTAZANE) 50 MG tablet Take 50 mg by mouth 2 (two) times daily.     Multiple Vitamin (MULTIVITAMIN WITH MINERALS) TABS tablet Take 1 tablet by mouth every morning.     nicotine (NICODERM CQ - DOSED IN MG/24 HOURS) 21 mg/24hr patch Place 1 patch (21 mg total) onto the skin daily. 28 patch 1   oxybutynin (DITROPAN-XL) 10 MG 24 hr tablet Take 10 mg by mouth every morning.     oxyCODONE-acetaminophen (PERCOCET) 5-325 MG tablet Take 1 tablet by  mouth every 6 (six) hours as needed for severe pain. 8 tablet 0   Pancrelipase, Lip-Prot-Amyl, 24000-76000 units CPEP Take 2 capsules by mouth daily after breakfast.     pregabalin (LYRICA) 300 MG capsule Take 300 mg by mouth 2 (two) times daily.     sertraline (ZOLOFT) 100 MG tablet Take 100 mg by mouth every morning.     sodium chloride 1 g tablet Take 1 g by mouth 3 (three) times daily as needed (brain fog from sodium insufficiency).     trimethoprim (TRIMPEX) 100 MG tablet Take 100 mg by mouth every morning.     Vitamin D, Ergocalciferol, (DRISDOL) 1.25 MG (50000 UNIT) CAPS capsule Take 50,000 Units by mouth every Sunday.     VITAMIN E PO Take 1 capsule by mouth every morning.     No current facility-administered medications for this visit.     ROS:  See HPI  Physical Exam:    Incision:  well healed left neck incision without signs of infection Extremities: moving all extremities, no weakness Neuro:  left lower lip lag, no tongue deviation.  Marginal mandibular nerve palsy.       Assessment/Plan:  This is a 61 y.o. female who is s/p:s/p left CEA  on 12/02/21 by Dr. Virl Cagey.   This was done for symptomatic carotid stenosis.  She has a history of dizziness/ balance issues that got worse and more frequent.    He incision has healed well.  She does have numbness at the jaw line and her left lower lip lag with drooling.  Marginal mandibular nerve palsy.  This will likely recover with time.  She will f/u at 6 months post op for carotid duplex.  If she has problems or concerns she will call.     Roxy Horseman PA-C [facility] [phone]   Clinic MD:  Stanford Breed

## 2021-12-26 NOTE — Telephone Encounter (Signed)
Transitions of Care Pharmacy   Call attempted for a pharmacy transitions of care follow-up. HIPAA appropriate voicemail was left with call back information provided.   Call attempt #3.   

## 2021-12-27 ENCOUNTER — Ambulatory Visit (INDEPENDENT_AMBULATORY_CARE_PROVIDER_SITE_OTHER): Payer: BC Managed Care – PPO | Admitting: Physician Assistant

## 2021-12-27 ENCOUNTER — Encounter: Payer: Self-pay | Admitting: Physician Assistant

## 2021-12-27 VITALS — BP 140/68 | HR 66 | Temp 97.8°F | Resp 20 | Ht 69.0 in | Wt 150.0 lb

## 2021-12-27 DIAGNOSIS — I6522 Occlusion and stenosis of left carotid artery: Secondary | ICD-10-CM

## 2021-12-27 MED ORDER — CLOPIDOGREL BISULFATE 75 MG PO TABS: 75.0000 mg | ORAL_TABLET | Freq: Every day | ORAL | 6 refills | Status: DC

## 2021-12-30 ENCOUNTER — Other Ambulatory Visit: Payer: Self-pay | Admitting: *Deleted

## 2021-12-30 NOTE — Patient Outreach (Signed)
[place] [facility]) Care Management  12/30/2021  Annet Manukyan 07/17/1960 [id number]   Outgoing call placed to member, state she is doing well.  Currently traveling to an appointment to get a skin cancer removed.  Report she had follow up with vascular surgeon, no concerns noted, will revisit in 6 months.  She has been in contact with neurology, will follow up within the next few weeks.  Denies any urgent concerns, encouraged to contact this care manager with questions.  Will close case at this time, no further needs identified.  Valente David, RN, MSN, [place] Manager [phone]

## 2021-12-31 ENCOUNTER — Other Ambulatory Visit: Payer: Self-pay | Admitting: *Deleted

## 2021-12-31 DIAGNOSIS — I6522 Occlusion and stenosis of left carotid artery: Secondary | ICD-10-CM

## 2022-01-22 ENCOUNTER — Ambulatory Visit: Payer: BC Managed Care – PPO | Admitting: Physical Therapy

## 2022-01-26 ENCOUNTER — Other Ambulatory Visit: Payer: Self-pay

## 2022-01-26 ENCOUNTER — Encounter (HOSPITAL_BASED_OUTPATIENT_CLINIC_OR_DEPARTMENT_OTHER): Payer: Self-pay | Admitting: Emergency Medicine

## 2022-01-26 ENCOUNTER — Emergency Department (HOSPITAL_BASED_OUTPATIENT_CLINIC_OR_DEPARTMENT_OTHER): Payer: BC Managed Care – PPO

## 2022-01-26 ENCOUNTER — Emergency Department (HOSPITAL_BASED_OUTPATIENT_CLINIC_OR_DEPARTMENT_OTHER)
Admission: EM | Admit: 2022-01-26 | Discharge: 2022-01-26 | Disposition: A | Discharge status: Home / Self Care | Payer: BC Managed Care – PPO | Attending: Emergency Medicine | Admitting: Emergency Medicine

## 2022-01-26 DIAGNOSIS — Z794 Long term (current) use of insulin: Secondary | ICD-10-CM | POA: Insufficient documentation

## 2022-01-26 DIAGNOSIS — R0602 Shortness of breath: Secondary | ICD-10-CM | POA: Diagnosis not present

## 2022-01-26 DIAGNOSIS — R1030 Lower abdominal pain, unspecified: Secondary | ICD-10-CM | POA: Diagnosis present

## 2022-01-26 DIAGNOSIS — Z7902 Long term (current) use of antithrombotics/antiplatelets: Secondary | ICD-10-CM | POA: Insufficient documentation

## 2022-01-26 DIAGNOSIS — Z79899 Other long term (current) drug therapy: Secondary | ICD-10-CM | POA: Insufficient documentation

## 2022-01-26 DIAGNOSIS — K567 Ileus, unspecified: Secondary | ICD-10-CM | POA: Diagnosis not present

## 2022-01-26 DIAGNOSIS — Z7982 Long term (current) use of aspirin: Secondary | ICD-10-CM | POA: Insufficient documentation

## 2022-01-26 LAB — CBC
HCT: 32.1 % — ABNORMAL LOW (ref 36.0–46.0)
Hemoglobin: 10.5 g/dL — ABNORMAL LOW (ref 12.0–15.0)
MCH: 30.3 pg (ref 26.0–34.0)
MCHC: 32.7 g/dL (ref 30.0–36.0)
MCV: 92.8 fL (ref 80.0–100.0)
Platelets: 225 10*3/uL (ref 150–400)
RBC: 3.46 MIL/uL — ABNORMAL LOW (ref 3.87–5.11)
RDW: 14.1 % (ref 11.5–15.5)
WBC: 5.8 10*3/uL (ref 4.0–10.5)
nRBC: 0 % (ref 0.0–0.2)

## 2022-01-26 LAB — TROPONIN I (HIGH SENSITIVITY): Troponin I (High Sensitivity): <2 ng/L (ref <18)

## 2022-01-26 LAB — COMPREHENSIVE METABOLIC PANEL
ALT: 12 U/L (ref 0–44)
AST: 23 U/L (ref 15–41)
Albumin: 3.6 g/dL (ref 3.5–5.0)
Alkaline Phosphatase: 85 U/L (ref 38–126)
Anion gap: 8 (ref 5–15)
BUN: 17 mg/dL (ref 8–23)
CO2: 16 mmol/L — ABNORMAL LOW (ref 22–32)
Calcium: 8.6 mg/dL — ABNORMAL LOW (ref 8.9–10.3)
Chloride: 111 mmol/L (ref 98–111)
Creatinine, Ser: 1.34 mg/dL — ABNORMAL HIGH (ref 0.44–1.00)
GFR, Estimated: 45 mL/min — ABNORMAL LOW (ref >60)
Glucose, Bld: 129 mg/dL — ABNORMAL HIGH (ref 70–99)
Potassium: 4.1 mmol/L (ref 3.5–5.1)
Sodium: 135 mmol/L (ref 135–145)
Total Bilirubin: 0.5 mg/dL (ref 0.3–1.2)
Total Protein: 7.2 g/dL (ref 6.5–8.1)

## 2022-01-26 LAB — LIPASE, BLOOD: Lipase: 18 U/L (ref 11–51)

## 2022-01-26 LAB — LACTIC ACID, PLASMA: Lactic Acid, Venous: 1.4 mmol/L (ref 0.5–1.9)

## 2022-01-26 LAB — BRAIN NATRIURETIC PEPTIDE: B Natriuretic Peptide: 33.6 pg/mL (ref 0.0–100.0)

## 2022-01-26 MED ORDER — FENTANYL CITRATE PF 50 MCG/ML IJ SOSY
50.0000 ug | PREFILLED_SYRINGE | Freq: Once | INTRAMUSCULAR | Status: DC
  Filled 2022-01-26: qty 1

## 2022-01-26 MED ORDER — SODIUM CHLORIDE 0.9 % IV BOLUS
1000.0000 mL | Freq: Once | INTRAVENOUS | Status: AC
  Administered 2022-01-26: 1000 mL via INTRAVENOUS

## 2022-01-26 MED ORDER — ONDANSETRON 4 MG PO TBDP: 4.0000 mg | ORAL_TABLET | Freq: Once | ORAL | Status: DC | PRN

## 2022-01-26 MED ORDER — BISACODYL 10 MG RE SUPP: 10.0000 mg | RECTAL | 0 refills | Status: DC | PRN

## 2022-01-26 MED ORDER — ONDANSETRON HCL 4 MG/2ML IJ SOLN
4.0000 mg | Freq: Once | INTRAMUSCULAR | Status: AC
  Administered 2022-01-26: 4 mg via INTRAVENOUS
  Filled 2022-01-26: qty 2

## 2022-01-26 MED ORDER — IOHEXOL 300 MG/ML  SOLN
80.0000 mL | Freq: Once | INTRAMUSCULAR | Status: AC | PRN
  Administered 2022-01-26: 80 mL via INTRAVENOUS

## 2022-01-26 NOTE — ED Provider Notes (Signed)
[facility] EMERGENCY DEPARTMENT Provider Note   CSN: [id number] Arrival date & time: 01/26/22  1247     History  Chief Complaint  Patient presents with   Abdominal Pain   Shortness of Breath   Hypotension    Michelle Nicholson is a 61 y.o. female.  She is brought in by her husband for evaluation of severe low abdominal pain that started earlier today.  Associated with nausea and dry heaves.  She said she has been moving her bowels and passed 2 bowel movements today.  No blood.  No fevers or chills.  Has felt short of breath and felt like she was in a pass out due to the pain.  She said she had this pain once before when she had a bowel obstruction.  She said that required multiple enemas.  She denies any prior surgical history.  She is diabetic.  The history is provided by the patient.  Abdominal Pain Pain location:  LLQ, RLQ and suprapubic Pain quality: aching and cramping   Pain radiates to:  Does not radiate Pain severity:  Severe Onset quality:  Sudden Timing:  Constant Progression:  Unchanged Chronicity:  Recurrent Relieved by:  Nothing Worsened by:  Nothing Ineffective treatments:  None tried Associated symptoms: nausea, shortness of breath and vomiting   Associated symptoms: no chest pain, no diarrhea, no dysuria, no fever, no hematemesis, no hematochezia and no hematuria        Home Medications Prior to Admission medications   Medication Sig Start Date End Date Taking? Authorizing Provider  acetaminophen (TYLENOL) 500 MG tablet Take 1,000 mg by mouth daily as needed for headache (pain).    [provider]  albuterol (VENTOLIN HFA) 108 (90 Base) MCG/ACT inhaler Inhale 1-2 puffs into the lungs every 6 (six) hours as needed for wheezing or shortness of breath.    [provider]  amLODipine (NORVASC) 2.5 MG tablet Take 2.5 mg by mouth at bedtime. 04/18/21   [provider]  amLODipine (NORVASC) 5 MG tablet Take 5 mg by mouth every morning.  03/14/21   [provider]  aspirin 325 MG tablet Take 1 tablet (325 mg total) by mouth daily. 11/30/21   Danford, Suann Larry, MD  atorvastatin (LIPITOR) 40 MG tablet Take 40 mg by mouth every morning. 04/01/21   [provider]  buPROPion (WELLBUTRIN XL) 150 MG 24 hr tablet Take 150 mg by mouth every morning. 03/05/21   [provider]  Calcium-Magnesium-Zinc (CAL-MAG-ZINC PO) Take 1 tablet by mouth every morning.    [provider]  ciprofloxacin (CIPRO) 500 MG tablet Take 500 mg by mouth 2 (two) times daily.    [provider]  clopidogrel (PLAVIX) 75 MG tablet Take 1 tablet (75 mg total) by mouth daily. 12/27/21   Ulyses Amor, PA-C  Cyanocobalamin (VITAMIN B-12 PO) Take 1 tablet by mouth every morning.    [provider]  estradiol (ESTRACE) 0.1 MG/GM vaginal cream Place 1 Applicatorful vaginally at bedtime.    [provider]  ferrous sulfate 325 (65 FE) MG tablet Take 1 tablet (325 mg total) by mouth 2 (two) times daily with a meal. Patient taking differently: Take 325 mg by mouth every morning. 04/25/21 12/27/21  Antonieta Pert, MD  fluorouracil (EFUDEX) 5 % cream Apply 1 application. topically daily as needed (skin cancer breakouts).    [provider]  fluticasone-salmeterol (ADVAIR) 100-50 MCG/ACT AEPB Inhale 1 puff into the lungs daily as needed (shortness of breath).  [provider]  folic acid (FOLVITE) 1 MG tablet Take 1 mg by mouth every morning.    [provider]  insulin aspart (NOVOLOG) 100 UNIT/ML FlexPen Inject 4 Units into the skin See admin instructions. Inject 4 units subcutaneously three times daily after meals - plus sliding scale adjustment - add 1 unit for every 50 units over 200    [provider]  Insulin Degludec (TRESIBA FLEXTOUCH) 100 UNIT/ML SOPN Inject 7 Units into the skin daily. Patient taking differently: Inject 16 Units into the skin daily after breakfast. 12/10/15    Nita Sells, MD  ipratropium-albuterol (DUONEB) 0.5-2.5 (3) MG/3ML SOLN Take 3 mLs by nebulization every 6 (six) hours as needed for up to 15 days. 04/25/21 11/28/21  Antonieta Pert, MD  latanoprost (XALATAN) 0.005 % ophthalmic solution Place 1 drop into both eyes at bedtime. 04/10/21   [provider]  levothyroxine (SYNTHROID) 50 MCG tablet Take 50 mcg by mouth daily before breakfast.    [provider]  losartan (COZAAR) 50 MG tablet Take 50 mg by mouth every morning.    [provider]  methazolamide (NEPTAZANE) 50 MG tablet Take 50 mg by mouth 2 (two) times daily. 04/02/21   [provider]  Multiple Vitamin (MULTIVITAMIN WITH MINERALS) TABS tablet Take 1 tablet by mouth every morning.    [provider]  nicotine (NICODERM CQ - DOSED IN MG/24 HOURS) 21 mg/24hr patch Place 1 patch (21 mg total) onto the skin daily. 11/29/21 11/29/22  Danford, Suann Larry, MD  oxybutynin (DITROPAN-XL) 10 MG 24 hr tablet Take 10 mg by mouth every morning. 11/19/21   [provider]  oxyCODONE-acetaminophen (PERCOCET) 5-325 MG tablet Take 1 tablet by mouth every 6 (six) hours as needed for severe pain. 12/03/21   Rhyne, Samantha J, PA-C  Pancrelipase, Lip-Prot-Amyl, 24000-76000 units CPEP Take 2 capsules by mouth daily after breakfast.    [provider]  pregabalin (LYRICA) 300 MG capsule Take 300 mg by mouth 2 (two) times daily. 02/05/21   [provider]  sertraline (ZOLOFT) 100 MG tablet Take 100 mg by mouth every morning.    [provider]  sodium chloride 1 g tablet Take 1 g by mouth 3 (three) times daily as needed (brain fog from sodium insufficiency). 12/24/20   [provider]  trimethoprim (TRIMPEX) 100 MG tablet Take 100 mg by mouth every morning.    [provider]  Vitamin D, Ergocalciferol, (DRISDOL) 1.25 MG (50000 UNIT) CAPS capsule Take 50,000 Units by mouth every Sunday. 01/28/21   [provider]  VITAMIN E PO Take 1 capsule by mouth every morning.    [provider]      Allergies    Dorzolamide hcl-timolol mal, Brimonidine, and Netarsudil-latanoprost    Review of Systems   Review of Systems  Constitutional:  Negative for fever.  Eyes:  Negative for visual disturbance.  Respiratory:  Positive for shortness of breath.   Cardiovascular:  Negative for chest pain.  Gastrointestinal:  Positive for abdominal pain, nausea and vomiting. Negative for diarrhea, hematemesis and hematochezia.  Genitourinary:  Negative for dysuria and hematuria.  Skin:  Negative for rash.  Neurological:  Positive for light-headedness.    Physical Exam Updated Vital Signs BP (!) 153/67   Pulse 66   Resp 14   SpO2 97%  Physical Exam Vitals and nursing note reviewed.  Constitutional:      General: She is in acute distress.     Appearance:  Normal appearance. She is well-developed.  HENT:     Head: Normocephalic and atraumatic.  Eyes:     Conjunctiva/sclera: Conjunctivae normal.  Cardiovascular:     Rate and Rhythm: Normal rate and regular rhythm.     Heart sounds: No murmur heard. Pulmonary:     Effort: Pulmonary effort is normal. No respiratory distress.     Breath sounds: Normal breath sounds.  Abdominal:     Palpations: Abdomen is soft.     Tenderness: There is generalized abdominal tenderness and tenderness in the right lower quadrant, suprapubic area and left lower quadrant. There is no guarding or rebound.  Musculoskeletal:        General: Normal range of motion.     Cervical back: Neck supple.     Right lower leg: No edema.     Left lower leg: No edema.  Skin:    General: Skin is warm and dry.     Capillary Refill: Capillary refill takes less than 2 seconds.  Neurological:     General: No focal deficit present.     Mental Status: She is alert.     ED Results / Procedures / Treatments   Labs (all labs ordered are listed, but only abnormal results are  displayed) Labs Reviewed  COMPREHENSIVE METABOLIC PANEL - Abnormal; Notable for the following components:      Result Value   CO2 16 (*)    Glucose, Bld 129 (*)    Creatinine, Ser 1.34 (*)    Calcium 8.6 (*)    GFR, Estimated 45 (*)    All other components within normal limits  CBC - Abnormal; Notable for the following components:   RBC 3.46 (*)    Hemoglobin 10.5 (*)    HCT 32.1 (*)    All other components within normal limits  CULTURE, BLOOD (ROUTINE X 2)  CULTURE, BLOOD (ROUTINE X 2)  LIPASE, BLOOD  LACTIC ACID, PLASMA  BRAIN NATRIURETIC PEPTIDE  URINALYSIS, ROUTINE W REFLEX MICROSCOPIC  TROPONIN I (HIGH SENSITIVITY)    EKG EKG Interpretation  Date/Time:  Sunday January 26 2022 13:14:45 EDT Ventricular Rate:  52 PR Interval:  190 QRS Duration: 142 QT Interval:  462 QTC Calculation: 429 R Axis:   75 Text Interpretation: Sinus bradycardia Right bundle branch block Abnormal ECG When compared with ECG of 28-Nov-2021 09:38, No significant change since last tracing Confirmed by Aletta Edouard [phone]) on 01/26/2022 1:18:20 PM  Radiology CT Abdomen Pelvis W Contrast  Result Date: 01/26/2022 CLINICAL DATA:  Abdominal pain and distention.  Nausea and vomiting. EXAM: CT ABDOMEN AND PELVIS WITH CONTRAST TECHNIQUE: Multidetector CT imaging of the abdomen and pelvis was performed using the standard protocol following bolus administration of intravenous contrast. RADIATION DOSE REDUCTION: This exam was performed according to the departmental dose-optimization program which includes automated exposure control, adjustment of the mA and/or kV according to patient size and/or use of iterative reconstruction technique. CONTRAST:  65m OMNIPAQUE IOHEXOL 300 MG/ML  SOLN COMPARISON:  Noncontrast CT on 10/21/2021 FINDINGS: Lower Chest: Subsegmental atelectasis or scarring in the right middle lobe. Hepatobiliary: Stable peripherally calcified lesions in the right hepatic lobe, consistent with benign  etiology. No new or enlarging liver lesions identified. Gallbladder is unremarkable. No evidence of biliary ductal dilatation. Pancreas: Diffuse pancreatic atrophy and calcifications, consistent with chronic pancreatitis. No evidence of pancreatic mass or acute pancreatitis. Spleen: Within normal limits in size and appearance. Adrenals/Urinary Tract: No masses identified. No evidence of ureteral calculi or hydronephrosis. Stomach/Bowel: Diffuse dilatation  of the colon is seen to the level the rectum. No evidence of dilated small bowel loops. No evidence of inflammatory process or abnormal fluid collections. Small hiatal hernia noted. Vascular/Lymphatic: 2 lesions with dense peripheral calcification are again seen in the retroperitoneum inferior to the pancreas, which are stable. These may represent calcified retroperitoneal lymph nodes or calcified pseudocysts. No acute vascular findings. Aortic atherosclerotic calcification incidentally noted. Reproductive: 1 cm calcified fibroid again seen in the lower uterine segment. Adnexal regions are unremarkable. Other:  None. Musculoskeletal: No suspicious bone lesions identified. Severe degenerative disc disease again noted at L2-3 and L5-S1. IMPRESSION: Diffuse colonic ileus. No evidence of bowel obstruction or acute inflammatory process. Chronic pancreatitis. No radiographic evidence of acute pancreatitis. Stable calcified retroperitoneal lymph nodes or calcified pseudocysts. Stable small hiatal hernia. Stable small calcified uterine fibroid. Aortic Atherosclerosis (ICD10-I70.0). Electronically Signed   By: Marlaine Hind M.D.   On: 01/26/2022 15:10   DG Chest Port 1 View  Result Date: 01/26/2022 CLINICAL DATA:  Shortness of breath. EXAM: PORTABLE CHEST 1 VIEW COMPARISON:  Two-view chest x-ray 10/31/2021 and 05/17/2021 FINDINGS: Heart size is normal. Mild bibasilar airspace opacities likely reflect atelectasis. No other significant airspace disease is present. Mild  pulmonary vascular congestion noted. IMPRESSION: Mild pulmonary vascular congestion and bibasilar atelectasis. Electronically Signed   By: San Morelle M.D.   On: 01/26/2022 13:46    Procedures Procedures    Medications Ordered in ED Medications  ondansetron (ZOFRAN-ODT) disintegrating tablet 4 mg (has no administration in time range)  fentaNYL (SUBLIMAZE) injection 50 mcg (0 mcg Intravenous Hold 01/26/22 1424)  iohexol (OMNIPAQUE) 300 MG/ML solution 80 mL (has no administration in time range)  sodium chloride 0.9 % bolus 1,000 mL (1,000 mLs Intravenous New Bag/Given 01/26/22 1400)  ondansetron (ZOFRAN) injection 4 mg (4 mg Intravenous Given 01/26/22 1400)    ED Course/ Medical Decision Making/ A&P Clinical Course as of 01/26/22 1431  Sun Jan 26, 2022  1318 EKG not crossing into epic.  Sinus bradycardia rate of 52 right bundle branch block no acute ST-T similar to 5/23 [MB]  1339 Chest x-ray interpreted by me as no acute infiltrates.  Awaiting radiology reading. [MB]    Clinical Course User Index [MB] Hayden Rasmussen, MD                           Medical Decision Making Amount and/or Complexity of Data Reviewed Labs: ordered. Radiology: ordered.  Risk OTC drugs. Prescription drug management.   This patient complains of abdominal pain nausea shortness of breath; this involves an extensive number of treatment Options and is a complaint that carries with it a high risk of complications and morbidity. The differential includes perforation, obstruction, diverticulitis, colitis, UTI, kidney stone  I ordered, reviewed and interpreted labs, which included CBC with normal white count, hemoglobin low stable from priors, chemistries with low bicarb elevated creatinine reflecting some dehydration, troponin normal, lactate normal, BNP normal I ordered medication IV fluids and nausea medication and reviewed PMP when indicated. I ordered imaging studies which included chest x-ray and  CT abdomen and I independently    visualized and interpreted imaging which showed ileus with dilated large bowel loops no obstruction Additional history obtained from patient's husband Previous records obtained and reviewed in epic no recent ED visits  Cardiac monitoring reviewed, normal sinus rhythm Social determinants considered, no significant barriers Critical Interventions: None  After the interventions stated above, I reevaluated the patient  and found patient's pain to be improved.  She is tolerating fluids. Admission and further testing considered, no indications for admission or further work-up at this time.  We will put on bowel regiment and recommend close follow-up with PCP.  Return instructions discussed         Final Clinical Impression(s) / ED Diagnoses Final diagnoses:  Ileus ([place])  Lower abdominal pain    Rx / DC Orders ED Discharge Orders          Ordered    bisacodyl (DULCOLAX) 10 MG suppository  As needed        01/26/22 1522              Hayden Rasmussen, MD 01/26/22 1731

## 2022-01-26 NOTE — ED Triage Notes (Signed)
Pt reports she began to have of shortness of breath and abd pain. Also having emesis. Had a recent stroke.

## 2022-01-26 NOTE — Discharge Instructions (Signed)
You are seen in the emergency department for lower abdominal pain.  You had blood work that showed yourself to be dehydrated.  Your CAT scan showed an ileus or an air-filled dilated large intestine.  We are prescribing you some suppositories and you can also try a fleets enema.  Clear liquid diets advance as tolerated.  Follow-up with your doctor.  Return to the emergency department if any worsening or concerning symptoms.

## 2022-01-26 NOTE — ED Notes (Signed)
Triage delayed, pt in restroom

## 2022-01-26 NOTE — ED Notes (Addendum)
As this RN was starting 2nd IV, pt noted to become somewhat sleepy and began snoring (she had previously been writhing and moaning; pt arouses easily to verbal stimuli and she and husband deny that she had any medication en route to ED; pt sts pain has eased off and sts she does not need any pain medication at this time. EDP notified.

## 2022-01-26 NOTE — ED Notes (Signed)
Patient seen before triage, RR 20, HR 57, SpO2 97% on room air, BBS CTA.

## 2022-01-31 LAB — CULTURE, BLOOD (ROUTINE X 2)
Culture: NO GROWTH
Culture: NO GROWTH
Special Requests: ADEQUATE

## 2022-09-30 ENCOUNTER — Other Ambulatory Visit: Payer: Self-pay | Admitting: Physician Assistant

## 2022-09-30 DIAGNOSIS — I6522 Occlusion and stenosis of left carotid artery: Secondary | ICD-10-CM

## 2022-10-08 ENCOUNTER — Telehealth: Payer: Self-pay | Admitting: *Deleted

## 2022-10-08 NOTE — Telephone Encounter (Signed)
Patient called Oral Surgeon and had form re- faxed to our office. Had [place] PA review and complete form. Form was faxed back to the Oral surgeon. Copy scanned int patients chart under Media tab. Patient notified form completed and faxed.

## 2022-10-08 NOTE — Telephone Encounter (Signed)
Patient called and states her Oral surgeon faxed over a clearance form for dental procedure and asking about holding Blood thinner to Dr Virl Cagey about 2 weeks ago and they havent heard back from [place]. I checked in the fax bin and also Dr Virl Cagey desk and couldn't locate a fax. I gave the fax number to the patient and asked her to check to make sure they were faxing to the right number. She states she will check with them and have them to resend the fax.

## 2022-11-05 ENCOUNTER — Encounter (HOSPITAL_BASED_OUTPATIENT_CLINIC_OR_DEPARTMENT_OTHER): Payer: Self-pay

## 2022-11-05 ENCOUNTER — Emergency Department (HOSPITAL_BASED_OUTPATIENT_CLINIC_OR_DEPARTMENT_OTHER): Payer: BC Managed Care – PPO

## 2022-11-05 ENCOUNTER — Other Ambulatory Visit: Payer: Self-pay

## 2022-11-05 ENCOUNTER — Emergency Department (HOSPITAL_BASED_OUTPATIENT_CLINIC_OR_DEPARTMENT_OTHER)
Admission: EM | Admit: 2022-11-05 | Discharge: 2022-11-05 | Disposition: A | Discharge status: Home / Self Care | Payer: BC Managed Care – PPO | Attending: Emergency Medicine | Admitting: Emergency Medicine

## 2022-11-05 DIAGNOSIS — E119 Type 2 diabetes mellitus without complications: Secondary | ICD-10-CM | POA: Diagnosis not present

## 2022-11-05 DIAGNOSIS — Z7982 Long term (current) use of aspirin: Secondary | ICD-10-CM | POA: Diagnosis not present

## 2022-11-05 DIAGNOSIS — Z7902 Long term (current) use of antithrombotics/antiplatelets: Secondary | ICD-10-CM | POA: Diagnosis not present

## 2022-11-05 DIAGNOSIS — Z79890 Hormone replacement therapy (postmenopausal): Secondary | ICD-10-CM | POA: Insufficient documentation

## 2022-11-05 DIAGNOSIS — E039 Hypothyroidism, unspecified: Secondary | ICD-10-CM | POA: Diagnosis not present

## 2022-11-05 DIAGNOSIS — I10 Essential (primary) hypertension: Secondary | ICD-10-CM | POA: Insufficient documentation

## 2022-11-05 DIAGNOSIS — W01198A Fall on same level from slipping, tripping and stumbling with subsequent striking against other object, initial encounter: Secondary | ICD-10-CM | POA: Diagnosis not present

## 2022-11-05 DIAGNOSIS — Z79899 Other long term (current) drug therapy: Secondary | ICD-10-CM | POA: Diagnosis not present

## 2022-11-05 DIAGNOSIS — Z8673 Personal history of transient ischemic attack (TIA), and cerebral infarction without residual deficits: Secondary | ICD-10-CM | POA: Diagnosis not present

## 2022-11-05 DIAGNOSIS — Z794 Long term (current) use of insulin: Secondary | ICD-10-CM | POA: Diagnosis not present

## 2022-11-05 DIAGNOSIS — S0083XA Contusion of other part of head, initial encounter: Secondary | ICD-10-CM | POA: Insufficient documentation

## 2022-11-05 DIAGNOSIS — S0990XA Unspecified injury of head, initial encounter: Secondary | ICD-10-CM | POA: Diagnosis present

## 2022-11-05 DIAGNOSIS — Y93H2 Activity, gardening and landscaping: Secondary | ICD-10-CM | POA: Diagnosis not present

## 2022-11-05 NOTE — ED Provider Notes (Signed)
EMERGENCY DEPARTMENT AT [facility] Provider Note   CSN: [id number] Arrival date & time: 11/05/22  2102     History  Chief Complaint  Patient presents with   Head Injury    Michelle Nicholson is a 62 y.o. female.  The history is provided by the patient and medical records. No language interpreter was used.  Head Injury Location:  Frontal Time since incident:  1 hour Mechanism of injury: fall   Fall:    Fall occurred:  Standing   Impact surface:  Curator (concrete statue)   Point of impact:  Head   Entrapped after fall: no   Pain details:    Quality:  Aching Chronicity:  New Relieved by:  Nothing Worsened by:  Nothing Ineffective treatments:  None tried Associated symptoms: headache and neck pain   Associated symptoms: no blurred vision, no nausea, no numbness, no seizures and no vomiting        Home Medications Prior to Admission medications   Medication Sig Start Date End Date Taking? Authorizing Provider  acetaminophen (TYLENOL) 500 MG tablet Take 1,000 mg by mouth daily as needed for headache (pain).    [provider]  albuterol (VENTOLIN HFA) 108 (90 Base) MCG/ACT inhaler Inhale 1-2 puffs into the lungs every 6 (six) hours as needed for wheezing or shortness of breath.    [provider]  amLODipine (NORVASC) 2.5 MG tablet Take 2.5 mg by mouth at bedtime. 04/18/21   [provider]  amLODipine (NORVASC) 5 MG tablet Take 5 mg by mouth every morning. 03/14/21   [provider]  aspirin 325 MG tablet Take 1 tablet (325 mg total) by mouth daily. 11/30/21   Danford, Earl Lites, MD  atorvastatin (LIPITOR) 40 MG tablet Take 40 mg by mouth every morning. 04/01/21   [provider]  bisacodyl (DULCOLAX) 10 MG suppository Place 1 suppository (10 mg total) rectally as needed for moderate constipation. 01/26/22   Terrilee Files, MD  buPROPion (WELLBUTRIN XL) 150 MG 24 hr tablet Take 150 mg by mouth every morning.  03/05/21   [provider]  Calcium-Magnesium-Zinc (CAL-MAG-ZINC PO) Take 1 tablet by mouth every morning.    [provider]  ciprofloxacin (CIPRO) 500 MG tablet Take 500 mg by mouth 2 (two) times daily.    [provider]  clopidogrel (PLAVIX) 75 MG tablet Take 1 tablet by mouth once daily 09/30/22   Chuck Hint, MD  Cyanocobalamin (VITAMIN B-12 PO) Take 1 tablet by mouth every morning.    [provider]  estradiol (ESTRACE) 0.1 MG/GM vaginal cream Place 1 Applicatorful vaginally at bedtime.    [provider]  ferrous sulfate 325 (65 FE) MG tablet Take 1 tablet (325 mg total) by mouth 2 (two) times daily with a meal. Patient taking differently: Take 325 mg by mouth every morning. 04/25/21 12/27/21  Lanae Boast, MD  fluorouracil (EFUDEX) 5 % cream Apply 1 application. topically daily as needed (skin cancer breakouts).    [provider]  fluticasone-salmeterol (ADVAIR) 100-50 MCG/ACT AEPB Inhale 1 puff into the lungs daily as needed (shortness of breath).    [provider]  folic acid (FOLVITE) 1 MG tablet Take 1 mg by mouth every morning.    [provider]  insulin aspart (NOVOLOG) 100 UNIT/ML FlexPen Inject 4 Units into the skin See admin instructions. Inject 4 units subcutaneously three times daily after meals - plus sliding scale adjustment - add 1 unit for every  50 units over 200    [provider]  Insulin Degludec (TRESIBA FLEXTOUCH) 100 UNIT/ML SOPN Inject 7 Units into the skin daily. Patient taking differently: Inject 16 Units into the skin daily after breakfast. 12/10/15   Rhetta Mura, MD  ipratropium-albuterol (DUONEB) 0.5-2.5 (3) MG/3ML SOLN Take 3 mLs by nebulization every 6 (six) hours as needed for up to 15 days. 04/25/21 11/28/21  Lanae Boast, MD  latanoprost (XALATAN) 0.005 % ophthalmic solution Place 1 drop into both eyes at bedtime. 04/10/21   [provider]  levothyroxine  (SYNTHROID) 50 MCG tablet Take 50 mcg by mouth daily before breakfast.    [provider]  losartan (COZAAR) 50 MG tablet Take 50 mg by mouth every morning.    [provider]  methazolamide (NEPTAZANE) 50 MG tablet Take 50 mg by mouth 2 (two) times daily. 04/02/21   [provider]  Multiple Vitamin (MULTIVITAMIN WITH MINERALS) TABS tablet Take 1 tablet by mouth every morning.    [provider]  nicotine (NICODERM CQ - DOSED IN MG/24 HOURS) 21 mg/24hr patch Place 1 patch (21 mg total) onto the skin daily. 11/29/21 11/29/22  Danford, Earl Lites, MD  oxybutynin (DITROPAN-XL) 10 MG 24 hr tablet Take 10 mg by mouth every morning. 11/19/21   [provider]  oxyCODONE-acetaminophen (PERCOCET) 5-325 MG tablet Take 1 tablet by mouth every 6 (six) hours as needed for severe pain. 12/03/21   Rhyne, Samantha J, PA-C  Pancrelipase, Lip-Prot-Amyl, 24000-76000 units CPEP Take 2 capsules by mouth daily after breakfast.    [provider]  pregabalin (LYRICA) 300 MG capsule Take 300 mg by mouth 2 (two) times daily. 02/05/21   [provider]  sertraline (ZOLOFT) 100 MG tablet Take 100 mg by mouth every morning.    [provider]  sodium chloride 1 g tablet Take 1 g by mouth 3 (three) times daily as needed (brain fog from sodium insufficiency). 12/24/20   [provider]  trimethoprim (TRIMPEX) 100 MG tablet Take 100 mg by mouth every morning.    [provider]  Vitamin D, Ergocalciferol, (DRISDOL) 1.25 MG (50000 UNIT) CAPS capsule Take 50,000 Units by mouth every Sunday. 01/28/21   [provider]  VITAMIN E PO Take 1 capsule by mouth every morning.    [provider]      Allergies    Dorzolamide hcl-timolol mal, Brimonidine, and Netarsudil-latanoprost    Review of Systems   Review of Systems  Constitutional:  Negative for chills, fatigue and fever.  HENT:  Negative for congestion.   Eyes:  Negative for  blurred vision.  Respiratory:  Negative for cough, chest tightness, shortness of breath and wheezing.   Cardiovascular:  Negative for chest pain.  Gastrointestinal:  Negative for constipation, diarrhea, nausea and vomiting.  Genitourinary:  Negative for dysuria.  Musculoskeletal:  Positive for neck pain. Negative for back pain and neck stiffness.  Skin:  Negative for rash and wound.  Neurological:  Positive for headaches. Negative for dizziness, seizures, weakness, light-headedness and numbness.  Psychiatric/Behavioral:  Negative for agitation and confusion.   All other systems reviewed and are negative.   Physical Exam Updated Vital Signs BP (!) 190/64   Pulse 78   Temp 98.1 F (36.7 C)   Resp 18   Ht 5\' 9"  (1.753 m)   Wt 70.3 kg   SpO2 100%   BMI 22.89 kg/m  Physical Exam Vitals and nursing note reviewed.  Constitutional:  General: She is not in acute distress.    Appearance: She is well-developed. She is not ill-appearing, toxic-appearing or diaphoretic.  HENT:     Head: Contusion present. No abrasion or laceration.      Nose: No congestion or rhinorrhea.     Mouth/Throat:     Mouth: Mucous membranes are moist.     Pharynx: No oropharyngeal exudate or posterior oropharyngeal erythema.  Eyes:     Extraocular Movements: Extraocular movements intact.     Conjunctiva/sclera: Conjunctivae normal.     Pupils: Pupils are equal, round, and reactive to light.  Cardiovascular:     Rate and Rhythm: Normal rate and regular rhythm.     Heart sounds: No murmur heard. Pulmonary:     Effort: Pulmonary effort is normal. No respiratory distress.     Breath sounds: Normal breath sounds. No wheezing, rhonchi or rales.  Chest:     Chest wall: No tenderness.  Abdominal:     General: Abdomen is flat.     Palpations: Abdomen is soft.     Tenderness: There is no abdominal tenderness. There is no guarding or rebound.  Musculoskeletal:        General: Tenderness present. No swelling.      Cervical back: Neck supple.     Right lower leg: No edema.     Left lower leg: No edema.  Skin:    General: Skin is warm and dry.     Capillary Refill: Capillary refill takes less than 2 seconds.     Coloration: Skin is not pale.     Findings: No erythema or rash.  Neurological:     General: No focal deficit present.     Mental Status: She is alert.     Sensory: No sensory deficit.     Motor: No weakness.  Psychiatric:        Mood and Affect: Mood normal.     ED Results / Procedures / Treatments   Labs (all labs ordered are listed, but only abnormal results are displayed) Labs Reviewed - No data to display  EKG None  Radiology CT Cervical Spine Wo Contrast  Result Date: 11/05/2022 CLINICAL DATA:  Status post fall. EXAM: CT CERVICAL SPINE WITHOUT CONTRAST TECHNIQUE: Multidetector CT imaging of the cervical spine was performed without intravenous contrast. Multiplanar CT image reconstructions were also generated. RADIATION DOSE REDUCTION: This exam was performed according to the departmental dose-optimization program which includes automated exposure control, adjustment of the mA and/or kV according to patient size and/or use of iterative reconstruction technique. COMPARISON:  None Available. FINDINGS: Alignment: There is approximately 2 mm retrolisthesis of the C3 vertebral body on C4 with 2 mm anterolisthesis of C4 on C5. Skull base and vertebrae: No acute fracture. No primary bone lesion or focal pathologic process. Soft tissues and spinal canal: No prevertebral fluid or swelling. No visible canal hematoma. Disc levels: Moderate to marked severity endplate sclerosis is seen at the levels of C3-C4 and C6-C7. Mild anterior osteophyte formation at the level of C6-C7 is also noted. There is marked severity narrowing of the anterior atlantoaxial articulation. Marked severity intervertebral disc space narrowing is seen at C6-C7. Moderate to marked severity, predominant posterior  intervertebral disc space narrowing is seen at C3-C4 and C4-C5. Bilateral mild-to-moderate severity multilevel facet joint hypertrophy is noted. Upper chest: Negative. Other: None. IMPRESSION: 1. No acute fracture within the cervical spine. 2. 2 mm retrolisthesis of the C3 vertebral body on C4 with 2 mm anterolisthesis of C4  on C5. 3. Moderate to marked severity degenerative changes at the levels of C3-C4 and C6-C7. Electronically Signed   By: Aram Candela M.D.   On: 11/05/2022 22:24   CT Head Wo Contrast  Result Date: 11/05/2022 CLINICAL DATA:  Status post trauma. EXAM: CT HEAD WITHOUT CONTRAST TECHNIQUE: Contiguous axial images were obtained from the base of the skull through the vertex without intravenous contrast. RADIATION DOSE REDUCTION: This exam was performed according to the departmental dose-optimization program which includes automated exposure control, adjustment of the mA and/or kV according to patient size and/or use of iterative reconstruction technique. COMPARISON:  None Available. FINDINGS: Brain: No evidence of acute infarction, hemorrhage, hydrocephalus, extra-axial collection or mass lesion/mass effect. Vascular: No hyperdense vessel or unexpected calcification. Skull: Normal. Negative for fracture or focal lesion. Sinuses/Orbits: There is mild to moderate severity bilateral maxillary sinus mucosal thickening. Other: Mild right frontal scalp soft tissue swelling is noted. IMPRESSION: 1. Mild right frontal scalp soft tissue swelling without evidence for an acute fracture or acute intracranial abnormality. 2. Mild to moderate severity bilateral maxillary sinus disease. Electronically Signed   By: Aram Candela M.D.   On: 11/05/2022 22:22    Procedures Procedures    Medications Ordered in ED Medications - No data to display  ED Course/ Medical Decision Making/ A&P                             Medical Decision Making Amount and/or Complexity of Data Reviewed Radiology:  ordered.    Treasure Ochs is a 62 y.o. female with a past medical history significant for hypertension, diabetes, previous strokes with unsteadiness difficulties, and hypothyroidism who presents with head injury and fall.  Patient reports he is on aspirin and Plavix and tonight was in a garden doing some gardening when she tipped forward and hit her forehead on a concrete statue.  She did not lose consciousness but due to the antiplatelet medication, she wants to make sure she does not have significant injury.  She is reporting mild headache and some neck pain but otherwise is denying any focal neurologic complaints.  She denies any vision changes, speech difficulties, or any numbness tingling or weakness of extremities.  She reports she simply leaned forward too far and lost balance and fell.  She reports she has had some balance troubles ever since her previous strokes.  She denies loss of consciousness and is only reporting mild headache and mild neck discomfort.  She reports almost did not come.  On exam, lungs clear and chest nontender.  Abdomen nontender.  Extremities nontender.  No focal neurologic deficit initially with intact sensation strength and pulses in extremities.  Symmetric smile.  Clear speech.  Pupils are symmetric and reactive without other abnormalities.  She had some mild tenderness on her central forehead and mild tenderness in her paraspinal neck but no midline tenderness.  Patient otherwise well-appearing.  We agreed together to get CT of the head and neck and if this is reassuring, dissipate discharge home.  We agreed that given lack of any preceding symptoms she likely did not need more extensive workup.  Patient had CT of the head and neck that showed no acute fracture.  We discussed that there was arthritis and changes on her neck but no acute traumatic injury.  Patient feels much better and has minimal headache.  She agrees with discharge home.  She will follow-up with her  primary doctor and understood  return precautions.  Patient discharged in good condition.         Final Clinical Impression(s) / ED Diagnoses Final diagnoses:  Injury of head, initial encounter    Rx / DC Orders ED Discharge Orders     None       Clinical Impression: 1. Injury of head, initial encounter     Disposition: Discharge  Condition: Good  I have discussed the results, Dx and Tx plan with the pt(& family if present). He/she/they expressed understanding and agree(s) with the plan. Discharge instructions discussed at great length. Strict return precautions discussed and pt &/or family have verbalized understanding of the instructions. No further questions at time of discharge.    Discharge Medication List as of 11/05/2022 10:43 PM      Follow Up: Bailey Mech, PA-C [id number] [place] [address] [phone]     [facility] Emergency Department at [facility] [id number] [place] [address] [phone]       Vendetta Pittinger, Canary Brim, MD 11/05/22 [phone]

## 2022-11-05 NOTE — ED Notes (Signed)
Patient transported to CT 

## 2022-11-05 NOTE — ED Triage Notes (Signed)
Fall in the garden hitting head on concrete figure ~45 minutes prior to arrival.   Compliant on Plavix for the last year.   Denies LOC but does admit to headache and bump on right side of forehead.

## 2022-11-05 NOTE — Discharge Instructions (Signed)
Your history, exam, and evaluation today did not show evidence of significant intracranial injury or skull fracture from the fall.  It did show evidence of arthritis in your neck as we discussed so please follow-up with your primary doctor for further management of this.  Please rest and stay hydrated and be careful not to fall.  If any symptoms change or worsen acutely, please return to the nearest emergency department.

## 2022-11-07 ENCOUNTER — Encounter: Payer: Self-pay | Admitting: Vascular Surgery

## 2022-11-07 ENCOUNTER — Ambulatory Visit (HOSPITAL_COMMUNITY)
Admission: RE | Admit: 2022-11-07 | Discharge: 2022-11-07 | Disposition: A | Discharge status: Home / Self Care | Payer: BC Managed Care – PPO | Source: Ambulatory Visit | Attending: Vascular Surgery | Admitting: Vascular Surgery

## 2022-11-07 ENCOUNTER — Ambulatory Visit (INDEPENDENT_AMBULATORY_CARE_PROVIDER_SITE_OTHER): Payer: BC Managed Care – PPO | Admitting: Vascular Surgery

## 2022-11-07 DIAGNOSIS — I6522 Occlusion and stenosis of left carotid artery: Secondary | ICD-10-CM

## 2022-11-07 MED ORDER — CLOPIDOGREL BISULFATE 75 MG PO TABS: 75.0000 mg | ORAL_TABLET | Freq: Every day | ORAL | 0 refills | Status: DC

## 2022-11-07 NOTE — Progress Notes (Signed)
POST OPERATIVE OFFICE NOTE    HPI:  This is a 62 y.o. female who is s/p left CEA  on 12/02/21 by Dr. Karin Lieu.   This was done for symptomatic carotid stenosis with dizziness for years.   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  Imaging demonstrates greater than 80% stenosis of the left internal carotid artery.  On exam, Michelle Nicholson was doing well.  Since last seen, she denies TIA, stroke, amaurosis.  He continues to have episodes of dizziness on a weekly basis, and is unsure as to whether this is blood pressure related, or sequela from her stroke which led to her carotid endarterectomy.  Pt returns today for follow up.   No speech or swallowing difficulties.    Allergies  Allergen Reactions   Dorzolamide Hcl-Timolol Mal Itching and Other (See Comments)    Makes her eyes burn  Conjunctival Injection with Burning  Visual Acuity Blurry  Other Reaction(s): Other  Makes her eyes burn  Conjunctival Injection with Burning  Visual Acuity Blurry  Makes her eyes burn  VA blurry  Conjunctival Injection with Burning  Visual Acuity with Blurred Vision  Conjunctival Injection with Burning    Visual Acuity with Blurred Vision    Makes her eyes burn Conjunctival Injection with Burning Visual Acuity Blurry  Conjunctival Injection with Burning, , Visual Acuity with Blurred Vision, , Makes her eyes burn Conjunctival Injection with Burning Visual Acuity Blurry   Brimonidine Rash and Other (See Comments)    Follicular conjunctivitis   Netarsudil-Latanoprost Other (See Comments)    Redness, tearing, burning  Other Reaction(s): Other (See Comments)  Redness, tearing, burning  Redness, tearing, burning  Redness, tearing, burning Redness, tearing, burning    Current Outpatient Medications  Medication Sig Dispense Refill   acetaminophen (TYLENOL) 500 MG tablet Take 1,000 mg by mouth daily as needed for headache  (pain).     albuterol (VENTOLIN HFA) 108 (90 Base) MCG/ACT inhaler Inhale 1-2 puffs into the lungs every 6 (six) hours as needed for wheezing or shortness of breath.     aspirin 325 MG tablet Take 1 tablet (325 mg total) by mouth daily. 30 tablet 3   atorvastatin (LIPITOR) 40 MG tablet Take 40 mg by mouth every morning.     buPROPion (WELLBUTRIN XL) 150 MG 24 hr tablet Take 150 mg by mouth every morning.     Calcium-Magnesium-Zinc (CAL-MAG-ZINC PO) Take 1 tablet by mouth every morning.     clopidogrel (PLAVIX) 75 MG tablet Take 1 tablet by mouth once daily 30 tablet 0   estradiol (ESTRACE) 0.1 MG/GM vaginal cream Place 1 Applicatorful vaginally at bedtime.     fluorouracil (EFUDEX) 5 % cream Apply 1 application. topically daily as needed (skin cancer breakouts).     fluticasone-salmeterol (ADVAIR) 100-50 MCG/ACT AEPB Inhale 1 puff into the lungs daily as needed (shortness of breath).     folic acid (FOLVITE) 1 MG tablet Take 1 mg by mouth every morning.     insulin aspart (NOVOLOG) 100 UNIT/ML FlexPen Inject 4 Units into the skin See admin instructions. Inject 4 units subcutaneously three times daily after meals - plus sliding scale adjustment - add 1 unit for every 50 units over 200     Insulin Degludec (TRESIBA FLEXTOUCH) 100 UNIT/ML SOPN Inject 7 Units into the skin daily. (Patient taking differently: Inject 16 Units into the skin daily after breakfast.)     latanoprost (XALATAN)  0.005 % ophthalmic solution Place 1 drop into both eyes at bedtime.     levothyroxine (SYNTHROID) 50 MCG tablet Take 50 mcg by mouth daily before breakfast.     losartan (COZAAR) 50 MG tablet Take 50 mg by mouth every morning.     Multiple Vitamin (MULTIVITAMIN WITH MINERALS) TABS tablet Take 1 tablet by mouth every morning.     Pancrelipase, Lip-Prot-Amyl, 24000-76000 units CPEP Take 2 capsules by mouth daily after breakfast.     pregabalin (LYRICA) 300 MG capsule Take 300 mg by mouth 2 (two) times daily.      sertraline (ZOLOFT) 100 MG tablet Take 100 mg by mouth every morning.     sodium chloride 1 g tablet Take 1 g by mouth 3 (three) times daily as needed (brain fog from sodium insufficiency).     Vitamin D, Ergocalciferol, (DRISDOL) 1.25 MG (50000 UNIT) CAPS capsule Take 50,000 Units by mouth every Sunday.     ferrous sulfate 325 (65 FE) MG tablet Take 1 tablet (325 mg total) by mouth 2 (two) times daily with a meal. (Patient taking differently: Take 325 mg by mouth every morning.) 60 tablet 0   ipratropium-albuterol (DUONEB) 0.5-2.5 (3) MG/3ML SOLN Take 3 mLs by nebulization every 6 (six) hours as needed for up to 15 days. 180 mL 0   No current facility-administered medications for this visit.     ROS:  See HPI  Physical Exam:    Incision:  well healed left neck incision without signs of infection Extremities: moving all extremities, no weakness Neuro:  left lower lip lag, no tongue deviation.  Marginal mandibular nerve palsy.    Summary:  Right Carotid: Velocities in the right ICA are consistent with a 1-39%  stenosis.   Left Carotid: Velocities in the left ICA are consistent with a 1-39%  stenosis.   Vertebrals: Bilateral vertebral arteries demonstrate antegrade flow.  Subclavians: Normal flow hemodynamics were seen in bilateral subclavian               arteries.    Assessment/Plan:  This is a 62 y.o. female who is s/p:s/p left CEA  on 12/02/21 by Dr. Karin Lieu.     On exam today, Michelle Nicholson was doing well.  She has no residual neurodeficits from her carotid endarterectomy.  Incision is healed nicely.  Over the last several months, Michelle Nicholson has been doing well, she continues to battle some dizziness at times, which was the precipitating factor that brought her into the hospital and led to her carotid endarterectomy.  She denies TIA, stroke, amaurosis.  Imaging was reviewed demonstrating widely patent internal carotid arteries bilaterally vertebral arteries antegrade, normal flow dynamics in the  subclavian arteries.  She continues to have vertigo versus dizziness, I think she would benefit from neurology referral.  It may be from her stroke, however interestingly, it waxes and wanes.  Mandibular nerve palsy is no longer present.  Is to see her on a yearly basis with repeat carotid ultrasounds to ensure there is no restenosis. I asked that she continue aspirin, Plavix, high intensity statin  Of note, Michelle Nicholson is followed by [facility] vascular for lower extremity arterial disease.  Procedures include balloon angioplasty, however I am unsure as to which arteries were intervened upon.  She complained of bilateral hip claudication today, right greater than left.  I told her I be happy to order an exercise ABI to see if there is inflow stenosis should she choose to consolidate her vascular care, otherwise she  can ask her vascular surgeon in [facility].  He continues to smoke, and understands that this will lead to further vascular problems down the road   ***  [facility] [phone]   Clinic MD:  Lenell Antu

## 2022-11-12 ENCOUNTER — Telehealth: Payer: Self-pay

## 2022-11-12 NOTE — Telephone Encounter (Signed)
Attempted twice to return patients call regarding her referral sent to [facility]. Pt did not answer.

## 2022-11-17 ENCOUNTER — Other Ambulatory Visit: Payer: Self-pay

## 2022-11-17 DIAGNOSIS — I6522 Occlusion and stenosis of left carotid artery: Secondary | ICD-10-CM

## 2022-12-03 ENCOUNTER — Other Ambulatory Visit: Payer: Self-pay | Admitting: Vascular Surgery

## 2022-12-03 DIAGNOSIS — I6522 Occlusion and stenosis of left carotid artery: Secondary | ICD-10-CM

## 2022-12-04 ENCOUNTER — Other Ambulatory Visit: Payer: Self-pay | Admitting: Physician Assistant

## 2023-02-21 ENCOUNTER — Emergency Department (HOSPITAL_BASED_OUTPATIENT_CLINIC_OR_DEPARTMENT_OTHER): Payer: BC Managed Care – PPO

## 2023-02-21 ENCOUNTER — Emergency Department (HOSPITAL_BASED_OUTPATIENT_CLINIC_OR_DEPARTMENT_OTHER)
Admission: EM | Admit: 2023-02-21 | Discharge: 2023-02-22 | Disposition: A | Discharge status: Home / Self Care | Payer: BC Managed Care – PPO | Attending: Emergency Medicine | Admitting: Emergency Medicine

## 2023-02-21 ENCOUNTER — Other Ambulatory Visit: Payer: Self-pay

## 2023-02-21 DIAGNOSIS — W19XXXA Unspecified fall, initial encounter: Secondary | ICD-10-CM | POA: Insufficient documentation

## 2023-02-21 DIAGNOSIS — S8992XA Unspecified injury of left lower leg, initial encounter: Secondary | ICD-10-CM | POA: Diagnosis present

## 2023-02-21 DIAGNOSIS — S82002A Unspecified fracture of left patella, initial encounter for closed fracture: Secondary | ICD-10-CM | POA: Insufficient documentation

## 2023-02-21 DIAGNOSIS — Z7982 Long term (current) use of aspirin: Secondary | ICD-10-CM | POA: Diagnosis not present

## 2023-02-21 DIAGNOSIS — S0990XA Unspecified injury of head, initial encounter: Secondary | ICD-10-CM

## 2023-02-21 MED ORDER — HYDROCODONE-ACETAMINOPHEN 5-325 MG PO TABS
1.0000 | ORAL_TABLET | Freq: Once | ORAL | Status: AC
  Administered 2023-02-21: 1 via ORAL
  Filled 2023-02-21: qty 1

## 2023-02-21 NOTE — ED Provider Notes (Signed)
[facility]  Provider Note  CSN: [id number] Arrival date & time: 02/21/23 2302  History Chief Complaint  Patient presents with   Michelle Nicholson is a 62 y.o. female with prior history of stroke on ASA/Plavix, reports she was on her last day of a week-long cruise this morning when she fell walking to breakfast, landing on her L knee and hitting her face. Did not have LOC but did have a brief nose bleed. She refused to go to the hospital in [place] and managed to get back to the airport, on the plane to [place] and then drove straight to the ED for evaluation. She took some APAP earlier in the day but no other pain medication.    Home Medications Prior to Admission medications   Medication Sig Start Date End Date Taking? Authorizing Provider  acetaminophen (TYLENOL) 500 MG tablet Take 1,000 mg by mouth daily as needed for headache (pain).    [provider]  albuterol (VENTOLIN HFA) 108 (90 Base) MCG/ACT inhaler Inhale 1-2 puffs into the lungs every 6 (six) hours as needed for wheezing or shortness of breath.    [provider]  aspirin 325 MG tablet Take 1 tablet (325 mg total) by mouth daily. 11/30/21   Danford, Earl Lites, MD  atorvastatin (LIPITOR) 40 MG tablet Take 40 mg by mouth every morning. 04/01/21   [provider]  buPROPion (WELLBUTRIN XL) 150 MG 24 hr tablet Take 150 mg by mouth every morning. 03/05/21   [provider]  Calcium-Magnesium-Zinc (CAL-MAG-ZINC PO) Take 1 tablet by mouth every morning.    [provider]  clopidogrel (PLAVIX) 75 MG tablet Take 1 tablet by mouth once daily 12/04/22   Victorino Sparrow, MD  estradiol (ESTRACE) 0.1 MG/GM vaginal cream Place 1 Applicatorful vaginally at bedtime.    [provider]  ferrous sulfate 325 (65 FE) MG tablet Take 1 tablet (325 mg total) by mouth 2 (two) times daily with a meal. Patient taking differently: Take 325 mg by  mouth every morning. 04/25/21 12/27/21  Lanae Boast, MD  fluorouracil (EFUDEX) 5 % cream Apply 1 application. topically daily as needed (skin cancer breakouts).    [provider]  fluticasone-salmeterol (ADVAIR) 100-50 MCG/ACT AEPB Inhale 1 puff into the lungs daily as needed (shortness of breath).    [provider]  folic acid (FOLVITE) 1 MG tablet Take 1 mg by mouth every morning.    [provider]  insulin aspart (NOVOLOG) 100 UNIT/ML FlexPen Inject 4 Units into the skin See admin instructions. Inject 4 units subcutaneously three times daily after meals - plus sliding scale adjustment - add 1 unit for every 50 units over 200    [provider]  Insulin Degludec (TRESIBA FLEXTOUCH) 100 UNIT/ML SOPN Inject 7 Units into the skin daily. Patient taking differently: Inject 16 Units into the skin daily after breakfast. 12/10/15   Rhetta Mura, MD  ipratropium-albuterol (DUONEB) 0.5-2.5 (3) MG/3ML SOLN Take 3 mLs by nebulization every 6 (six) hours as needed for up to 15 days. 04/25/21 11/28/21  Lanae Boast, MD  latanoprost (XALATAN) 0.005 % ophthalmic solution Place 1 drop into both eyes at bedtime. 04/10/21   [provider]  levothyroxine (SYNTHROID) 50 MCG tablet Take 50 mcg by mouth daily before breakfast.    [provider]  losartan (COZAAR) 50 MG tablet Take 50 mg by mouth every morning.    [provider]  Multiple Vitamin (MULTIVITAMIN WITH MINERALS) TABS tablet Take 1 tablet by mouth every morning.    [provider]  Pancrelipase, Lip-Prot-Amyl, 24000-76000 units CPEP Take 2 capsules by mouth daily after breakfast.    [provider]  pregabalin (LYRICA) 300 MG capsule Take 300 mg by mouth 2 (two) times daily. 02/05/21   [provider]  sertraline (ZOLOFT) 100 MG tablet Take 100 mg by mouth every morning.    [provider]  sodium chloride 1 g tablet Take 1 g by mouth 3 (three) times daily  as needed (brain fog from sodium insufficiency). 12/24/20   [provider]  Vitamin D, Ergocalciferol, (DRISDOL) 1.25 MG (50000 UNIT) CAPS capsule Take 50,000 Units by mouth every Sunday. 01/28/21   [provider]     Allergies    Dorzolamide hcl-timolol mal, Brimonidine, and Netarsudil-latanoprost   Review of Systems   Review of Systems Please see HPI for pertinent positives and negatives  Physical Exam BP (!) 165/65   Pulse 65   Temp (!) 97.5 F (36.4 C) (Oral)   Resp 18   SpO2 97%   Physical Exam Vitals and nursing note reviewed.  Constitutional:      Appearance: Normal appearance.  HENT:     Head: Normocephalic and atraumatic.     Nose: Nose normal.     Comments: No active bleeding    Mouth/Throat:     Mouth: Mucous membranes are moist.  Eyes:     Extraocular Movements: Extraocular movements intact.     Conjunctiva/sclera: Conjunctivae normal.  Cardiovascular:     Rate and Rhythm: Normal rate.  Pulmonary:     Effort: Pulmonary effort is normal.     Breath sounds: Normal breath sounds.  Abdominal:     General: Abdomen is flat.     Palpations: Abdomen is soft.     Tenderness: There is no abdominal tenderness.  Musculoskeletal:        General: Swelling (L knee) and tenderness present. No deformity.     Cervical back: Neck supple.     Comments: Patient unable to ROM L knee due to pain  Skin:    General: Skin is warm and dry.  Neurological:     General: No focal deficit present.     Mental Status: She is alert.  Psychiatric:        Mood and Affect: Mood normal.     ED Results / Procedures / Treatments   EKG None  Procedures Procedures  Medications Ordered in the ED Medications  HYDROcodone-acetaminophen (NORCO/VICODIN) 5-325 MG per tablet 1 tablet (has no administration in time range)    Initial Impression and Plan  Patient here with mechanical fall about 15 hours ago while on a cruise ship. Has had severe L knee pain through the  day while travelling home. Also had a head and face injury while on Plavix. Will send for CT and xray. Norco for pain.   ED Course       MDM Rules/Calculators/A&P Medical Decision Making Amount and/or Complexity of Data Reviewed Radiology: ordered.  Risk Prescription drug management.     Final Clinical Impression(s) / ED Diagnoses Final diagnoses:  None    Rx / DC Orders ED Discharge Orders     None

## 2023-02-21 NOTE — ED Notes (Signed)
Pt taken to xray 

## 2023-02-21 NOTE — ED Triage Notes (Signed)
Patient returned from a cruise, she fell down earlier today on a marble floor.  Patient with left knee pain, she states that it hurts to move the leg or extend the knee.  She states that she face planted on the marble floor also, she is on plavix.  No LOC, full recall of the incident.  She flew back to [place] after falling in [place].

## 2023-02-22 MED ORDER — HYDROCODONE-ACETAMINOPHEN 5-325 MG PO TABS: 1.0000 | ORAL_TABLET | Freq: Four times a day (QID) | ORAL | 0 refills | Status: DC | PRN

## 2023-03-03 ENCOUNTER — Encounter (HOSPITAL_BASED_OUTPATIENT_CLINIC_OR_DEPARTMENT_OTHER): Payer: Self-pay | Admitting: Urology

## 2023-03-03 ENCOUNTER — Emergency Department (HOSPITAL_BASED_OUTPATIENT_CLINIC_OR_DEPARTMENT_OTHER): Payer: BC Managed Care – PPO

## 2023-03-03 ENCOUNTER — Other Ambulatory Visit: Payer: Self-pay

## 2023-03-03 ENCOUNTER — Observation Stay (HOSPITAL_BASED_OUTPATIENT_CLINIC_OR_DEPARTMENT_OTHER)
Admission: EM | Admit: 2023-03-03 | Discharge: 2023-03-07 | Disposition: A | Discharge status: Home Health | Payer: BC Managed Care – PPO | Attending: Internal Medicine | Admitting: Internal Medicine

## 2023-03-03 DIAGNOSIS — Z794 Long term (current) use of insulin: Secondary | ICD-10-CM | POA: Diagnosis not present

## 2023-03-03 DIAGNOSIS — N3000 Acute cystitis without hematuria: Secondary | ICD-10-CM

## 2023-03-03 DIAGNOSIS — R2681 Unsteadiness on feet: Secondary | ICD-10-CM | POA: Diagnosis not present

## 2023-03-03 DIAGNOSIS — E1051 Type 1 diabetes mellitus with diabetic peripheral angiopathy without gangrene: Secondary | ICD-10-CM | POA: Diagnosis not present

## 2023-03-03 DIAGNOSIS — F419 Anxiety disorder, unspecified: Secondary | ICD-10-CM | POA: Diagnosis not present

## 2023-03-03 DIAGNOSIS — S73191A Other sprain of right hip, initial encounter: Principal | ICD-10-CM | POA: Insufficient documentation

## 2023-03-03 DIAGNOSIS — M6281 Muscle weakness (generalized): Secondary | ICD-10-CM | POA: Insufficient documentation

## 2023-03-03 DIAGNOSIS — Z87891 Personal history of nicotine dependence: Secondary | ICD-10-CM | POA: Insufficient documentation

## 2023-03-03 DIAGNOSIS — E1165 Type 2 diabetes mellitus with hyperglycemia: Secondary | ICD-10-CM

## 2023-03-03 DIAGNOSIS — M1611 Unilateral primary osteoarthritis, right hip: Secondary | ICD-10-CM | POA: Diagnosis present

## 2023-03-03 DIAGNOSIS — Z85828 Personal history of other malignant neoplasm of skin: Secondary | ICD-10-CM | POA: Insufficient documentation

## 2023-03-03 DIAGNOSIS — I6523 Occlusion and stenosis of bilateral carotid arteries: Secondary | ICD-10-CM | POA: Insufficient documentation

## 2023-03-03 DIAGNOSIS — J449 Chronic obstructive pulmonary disease, unspecified: Secondary | ICD-10-CM | POA: Insufficient documentation

## 2023-03-03 DIAGNOSIS — E039 Hypothyroidism, unspecified: Secondary | ICD-10-CM | POA: Diagnosis not present

## 2023-03-03 DIAGNOSIS — M25551 Pain in right hip: Secondary | ICD-10-CM | POA: Diagnosis not present

## 2023-03-03 DIAGNOSIS — I10 Essential (primary) hypertension: Secondary | ICD-10-CM | POA: Diagnosis not present

## 2023-03-03 DIAGNOSIS — R296 Repeated falls: Secondary | ICD-10-CM | POA: Diagnosis not present

## 2023-03-03 DIAGNOSIS — Z8673 Personal history of transient ischemic attack (TIA), and cerebral infarction without residual deficits: Secondary | ICD-10-CM | POA: Insufficient documentation

## 2023-03-03 DIAGNOSIS — Z79899 Other long term (current) drug therapy: Secondary | ICD-10-CM | POA: Diagnosis not present

## 2023-03-03 DIAGNOSIS — F32A Depression, unspecified: Secondary | ICD-10-CM | POA: Insufficient documentation

## 2023-03-03 DIAGNOSIS — Z7902 Long term (current) use of antithrombotics/antiplatelets: Secondary | ICD-10-CM | POA: Insufficient documentation

## 2023-03-03 DIAGNOSIS — R739 Hyperglycemia, unspecified: Secondary | ICD-10-CM

## 2023-03-03 DIAGNOSIS — I6529 Occlusion and stenosis of unspecified carotid artery: Secondary | ICD-10-CM | POA: Diagnosis present

## 2023-03-03 DIAGNOSIS — N390 Urinary tract infection, site not specified: Secondary | ICD-10-CM | POA: Diagnosis not present

## 2023-03-03 DIAGNOSIS — E785 Hyperlipidemia, unspecified: Secondary | ICD-10-CM | POA: Diagnosis present

## 2023-03-03 LAB — CBG MONITORING, ED: Glucose-Capillary: 397 mg/dL — ABNORMAL HIGH (ref 70–99)

## 2023-03-03 MED ORDER — CYCLOBENZAPRINE HCL 10 MG PO TABS
10.0000 mg | ORAL_TABLET | Freq: Once | ORAL | Status: AC
  Administered 2023-03-03: 10 mg via ORAL
  Filled 2023-03-03: qty 1

## 2023-03-03 MED ORDER — OXYCODONE-ACETAMINOPHEN 5-325 MG PO TABS
1.0000 | ORAL_TABLET | Freq: Once | ORAL | Status: AC
  Administered 2023-03-03: 1 via ORAL
  Filled 2023-03-03: qty 1

## 2023-03-03 MED ORDER — DIAZEPAM 5 MG/ML IJ SOLN
5.0000 mg | Freq: Once | INTRAMUSCULAR | Status: AC
  Administered 2023-03-03: 5 mg via INTRAVENOUS
  Filled 2023-03-03: qty 2

## 2023-03-03 NOTE — ED Triage Notes (Signed)
Pt states has been walking with brace to left knee due to broke patella, now having right hip pain after feeling pain when taking a step yesterday  Pain with weight bearing

## 2023-03-03 NOTE — ED Provider Notes (Signed)
[place] [facility] Provider Note   CSN: [id number] Arrival date & time: 03/03/23  1950     History  Chief Complaint  Patient presents with   Hip Pain    Michelle Nicholson is a 62 y.o. female.   Patient to ED for evaluation right hip pain. She was diagnosed with left incomplete patellar fracture on 8/11 after a fall and has been walking with a brace. She has had some soreness in the lower back and right hip but today she drove to a doctor's appointment and felt sudden onset severe pain in the right hip that limited her mobility. No fall or direct impact. No abdominal pain, or extremity numbness or weakness.   The history is provided by the patient and a friend. No language interpreter was used.  Hip Pain       Home Medications Prior to Admission medications   Medication Sig Start Date End Date Taking? Authorizing Provider  acetaminophen (TYLENOL) 500 MG tablet Take 1,000 mg by mouth daily as needed for headache (pain).    [provider]  albuterol (VENTOLIN HFA) 108 (90 Base) MCG/ACT inhaler Inhale 1-2 puffs into the lungs every 6 (six) hours as needed for wheezing or shortness of breath.    [provider]  aspirin 325 MG tablet Take 1 tablet (325 mg total) by mouth daily. 11/30/21   Danford, Earl Lites, MD  atorvastatin (LIPITOR) 40 MG tablet Take 40 mg by mouth every morning. 04/01/21   [provider]  buPROPion (WELLBUTRIN XL) 150 MG 24 hr tablet Take 150 mg by mouth every morning. 03/05/21   [provider]  Calcium-Magnesium-Zinc (CAL-MAG-ZINC PO) Take 1 tablet by mouth every morning.    [provider]  clopidogrel (PLAVIX) 75 MG tablet Take 1 tablet by mouth once daily 12/04/22   Victorino Sparrow, MD  estradiol (ESTRACE) 0.1 MG/GM vaginal cream Place 1 Applicatorful vaginally at bedtime.    [provider]  ferrous sulfate 325 (65 FE) MG tablet Take 1 tablet (325 mg total) by mouth 2  (two) times daily with a meal. Patient taking differently: Take 325 mg by mouth every morning. 04/25/21 12/27/21  Lanae Boast, MD  fluorouracil (EFUDEX) 5 % cream Apply 1 application. topically daily as needed (skin cancer breakouts).    [provider]  fluticasone-salmeterol (ADVAIR) 100-50 MCG/ACT AEPB Inhale 1 puff into the lungs daily as needed (shortness of breath).    [provider]  folic acid (FOLVITE) 1 MG tablet Take 1 mg by mouth every morning.    [provider]  HYDROcodone-acetaminophen (NORCO/VICODIN) 5-325 MG tablet Take 1 tablet by mouth every 6 (six) hours as needed for severe pain. 02/22/23   Pollyann Savoy, MD  insulin aspart (NOVOLOG) 100 UNIT/ML FlexPen Inject 4 Units into the skin See admin instructions. Inject 4 units subcutaneously three times daily after meals - plus sliding scale adjustment - add 1 unit for every 50 units over 200    [provider]  Insulin Degludec (TRESIBA FLEXTOUCH) 100 UNIT/ML SOPN Inject 7 Units into the skin daily. Patient taking differently: Inject 16 Units into the skin daily after breakfast. 12/10/15   Rhetta Mura, MD  ipratropium-albuterol (DUONEB) 0.5-2.5 (3) MG/3ML SOLN Take 3 mLs by nebulization every 6 (six) hours as needed for up to 15 days. 04/25/21 11/28/21  Lanae Boast, MD  latanoprost (XALATAN) 0.005 % ophthalmic solution Place 1 drop into both eyes at bedtime. 04/10/21  [provider]  levothyroxine (SYNTHROID) 50 MCG tablet Take 50 mcg by mouth daily before breakfast.    [provider]  losartan (COZAAR) 50 MG tablet Take 50 mg by mouth every morning.    [provider]  Multiple Vitamin (MULTIVITAMIN WITH MINERALS) TABS tablet Take 1 tablet by mouth every morning.    [provider]  Pancrelipase, Lip-Prot-Amyl, 24000-76000 units CPEP Take 2 capsules by mouth daily after breakfast.    [provider]  pregabalin (LYRICA) 300 MG capsule Take 300  mg by mouth 2 (two) times daily. 02/05/21   [provider]  sertraline (ZOLOFT) 100 MG tablet Take 100 mg by mouth every morning.    [provider]  sodium chloride 1 g tablet Take 1 g by mouth 3 (three) times daily as needed (brain fog from sodium insufficiency). 12/24/20   [provider]  Vitamin D, Ergocalciferol, (DRISDOL) 1.25 MG (50000 UNIT) CAPS capsule Take 50,000 Units by mouth every Sunday. 01/28/21   [provider]      Allergies    Dorzolamide hcl-timolol mal, Brimonidine, and Netarsudil-latanoprost    Review of Systems   Review of Systems  Physical Exam Updated Vital Signs BP (!) 150/57 (BP Location: Right Arm)   Pulse 71   Temp 98.2 F (36.8 C)   Resp 18   Ht 5\' 9"  (1.753 m)   Wt 69.3 kg   SpO2 96%   BMI 22.57 kg/m  Physical Exam Vitals and nursing note reviewed.  Constitutional:      Comments: Uncomfortable appearing.  Abdominal:     General: There is no distension.     Palpations: Abdomen is soft.     Tenderness: There is no abdominal tenderness.  Musculoskeletal:     Comments: Right hip has no deformity or discoloration. Tender over lateral hip joint extending to proximal medial thigh without swelling. Distal pulses intact.   Neurological:     Sensory: No sensory deficit.     ED Results / Procedures / Treatments   Labs (all labs ordered are listed, but only abnormal results are displayed) Labs Reviewed - No data to display  EKG None  Radiology DG Hip Unilat W or Wo Pelvis 2-3 Views Right  Result Date: 03/03/2023 CLINICAL DATA:  Right hip pain EXAM: DG HIP (WITH OR WITHOUT PELVIS) 2-3V RIGHT COMPARISON:  None Available. FINDINGS: Hip joints and SI joints symmetric. No acute bony abnormality. Specifically, no fracture, subluxation, or dislocation. IMPRESSION: No acute bony abnormality. Electronically Signed   By: Charlett Nose M.D.   On: 03/03/2023 21:34    Procedures Procedures    Medications Ordered in  ED Medications  oxyCODONE-acetaminophen (PERCOCET/ROXICET) 5-325 MG per tablet 1 tablet (1 tablet Oral Given 03/03/23 2109)  cyclobenzaprine (FLEXERIL) tablet 10 mg (10 mg Oral Given 03/03/23 2109)    ED Course/ Medical Decision Making/ A&P                                 Medical Decision Making Severe pain x 1 day in right hip. Negative xray. Percocet and Flexeril provided with little relief. Pain seems to be out of proportion. Discussed with Dr. Jacqulyn Bath. CT of the hip ordered for further evaluation.   Patient care signed out to Dr. Jacqulyn Bath pending CT results and disposition.   Amount and/or Complexity of Data Reviewed Radiology: ordered.    Details: No fracture identified on plain film  Risk  Prescription drug management.           Final Clinical Impression(s) / ED Diagnoses Final diagnoses:  None    Rx / DC Orders ED Discharge Orders     None         Danne Harbor 03/03/23 2152    Maia Plan, MD 03/04/23 0009    Maia Plan, MD 03/05/23 [phone]

## 2023-03-03 NOTE — ED Notes (Signed)
Pt. Reports she had a knee immobilizer on the L knee due to she broke the L "Knee cap"  last week.

## 2023-03-04 ENCOUNTER — Observation Stay (HOSPITAL_COMMUNITY): Payer: BC Managed Care – PPO

## 2023-03-04 DIAGNOSIS — M25551 Pain in right hip: Secondary | ICD-10-CM | POA: Diagnosis present

## 2023-03-04 DIAGNOSIS — M6281 Muscle weakness (generalized): Secondary | ICD-10-CM | POA: Diagnosis not present

## 2023-03-04 DIAGNOSIS — J449 Chronic obstructive pulmonary disease, unspecified: Secondary | ICD-10-CM | POA: Diagnosis not present

## 2023-03-04 DIAGNOSIS — E039 Hypothyroidism, unspecified: Secondary | ICD-10-CM | POA: Diagnosis not present

## 2023-03-04 DIAGNOSIS — F32A Depression, unspecified: Secondary | ICD-10-CM | POA: Diagnosis not present

## 2023-03-04 DIAGNOSIS — Z87891 Personal history of nicotine dependence: Secondary | ICD-10-CM | POA: Diagnosis not present

## 2023-03-04 DIAGNOSIS — E1051 Type 1 diabetes mellitus with diabetic peripheral angiopathy without gangrene: Secondary | ICD-10-CM | POA: Diagnosis not present

## 2023-03-04 DIAGNOSIS — N390 Urinary tract infection, site not specified: Secondary | ICD-10-CM | POA: Diagnosis not present

## 2023-03-04 DIAGNOSIS — Z7902 Long term (current) use of antithrombotics/antiplatelets: Secondary | ICD-10-CM | POA: Diagnosis not present

## 2023-03-04 DIAGNOSIS — Z79899 Other long term (current) drug therapy: Secondary | ICD-10-CM | POA: Diagnosis not present

## 2023-03-04 DIAGNOSIS — Z8673 Personal history of transient ischemic attack (TIA), and cerebral infarction without residual deficits: Secondary | ICD-10-CM | POA: Diagnosis not present

## 2023-03-04 DIAGNOSIS — R2681 Unsteadiness on feet: Secondary | ICD-10-CM | POA: Diagnosis not present

## 2023-03-04 DIAGNOSIS — I10 Essential (primary) hypertension: Secondary | ICD-10-CM | POA: Diagnosis not present

## 2023-03-04 DIAGNOSIS — F419 Anxiety disorder, unspecified: Secondary | ICD-10-CM | POA: Diagnosis not present

## 2023-03-04 DIAGNOSIS — R296 Repeated falls: Secondary | ICD-10-CM | POA: Diagnosis not present

## 2023-03-04 DIAGNOSIS — I6523 Occlusion and stenosis of bilateral carotid arteries: Secondary | ICD-10-CM | POA: Diagnosis not present

## 2023-03-04 DIAGNOSIS — Z85828 Personal history of other malignant neoplasm of skin: Secondary | ICD-10-CM | POA: Diagnosis not present

## 2023-03-04 DIAGNOSIS — Z794 Long term (current) use of insulin: Secondary | ICD-10-CM | POA: Diagnosis not present

## 2023-03-04 LAB — CBC WITH DIFFERENTIAL/PLATELET
Abs Immature Granulocytes: 0.07 10*3/uL (ref 0.00–0.07)
Basophils Absolute: 0 10*3/uL (ref 0.0–0.1)
Basophils Relative: 0 %
Eosinophils Absolute: 0.1 10*3/uL (ref 0.0–0.5)
Eosinophils Relative: 1 %
HCT: 36.3 % (ref 36.0–46.0)
Hemoglobin: 11.9 g/dL — ABNORMAL LOW (ref 12.0–15.0)
Immature Granulocytes: 1 %
Lymphocytes Relative: 11 %
Lymphs Abs: 1.1 10*3/uL (ref 0.7–4.0)
MCH: 29.2 pg (ref 26.0–34.0)
MCHC: 32.8 g/dL (ref 30.0–36.0)
MCV: 89.2 fL (ref 80.0–100.0)
Monocytes Absolute: 0.8 10*3/uL (ref 0.1–1.0)
Monocytes Relative: 8 %
Neutro Abs: 8.1 10*3/uL — ABNORMAL HIGH (ref 1.7–7.7)
Neutrophils Relative %: 79 %
Platelets: 237 10*3/uL (ref 150–400)
RBC: 4.07 MIL/uL (ref 3.87–5.11)
RDW: 14.7 % (ref 11.5–15.5)
WBC: 10.2 10*3/uL (ref 4.0–10.5)
nRBC: 0 % (ref 0.0–0.2)

## 2023-03-04 LAB — BASIC METABOLIC PANEL
Anion gap: 13 (ref 5–15)
Anion gap: 8 (ref 5–15)
BUN: 17 mg/dL (ref 8–23)
BUN: 18 mg/dL (ref 8–23)
CO2: 23 mmol/L (ref 22–32)
CO2: 23 mmol/L (ref 22–32)
Calcium: 8.2 mg/dL — ABNORMAL LOW (ref 8.9–10.3)
Calcium: 8.7 mg/dL — ABNORMAL LOW (ref 8.9–10.3)
Chloride: 101 mmol/L (ref 98–111)
Chloride: 98 mmol/L (ref 98–111)
Creatinine, Ser: 1.25 mg/dL — ABNORMAL HIGH (ref 0.44–1.00)
Creatinine, Ser: 1.3 mg/dL — ABNORMAL HIGH (ref 0.44–1.00)
GFR, Estimated: 46 mL/min — ABNORMAL LOW (ref >60)
GFR, Estimated: 48 mL/min — ABNORMAL LOW (ref >60)
Glucose, Bld: 400 mg/dL — ABNORMAL HIGH (ref 70–99)
Glucose, Bld: 81 mg/dL (ref 70–99)
Potassium: 4.1 mmol/L (ref 3.5–5.1)
Potassium: 4.2 mmol/L (ref 3.5–5.1)
Sodium: 129 mmol/L — ABNORMAL LOW (ref 135–145)
Sodium: 137 mmol/L (ref 135–145)

## 2023-03-04 LAB — URINALYSIS, W/ REFLEX TO CULTURE (INFECTION SUSPECTED)
Bilirubin Urine: NEGATIVE
Glucose, UA: >=500 mg/dL — AB
Hgb urine dipstick: NEGATIVE
Ketones, ur: NEGATIVE mg/dL
Nitrite: POSITIVE — AB
Protein, ur: NEGATIVE mg/dL
Specific Gravity, Urine: 1.01 (ref 1.005–1.030)
pH: 5.5 (ref 5.0–8.0)

## 2023-03-04 LAB — GLUCOSE, CAPILLARY
Glucose-Capillary: 80 mg/dL (ref 70–99)
Glucose-Capillary: 78 mg/dL (ref 70–99)
Glucose-Capillary: 141 mg/dL — ABNORMAL HIGH (ref 70–99)

## 2023-03-04 LAB — HIV ANTIBODY (ROUTINE TESTING W REFLEX): HIV Screen 4th Generation wRfx: NONREACTIVE

## 2023-03-04 LAB — CBG MONITORING, ED: Glucose-Capillary: 108 mg/dL — ABNORMAL HIGH (ref 70–99)

## 2023-03-04 MED ORDER — CLOPIDOGREL BISULFATE 75 MG PO TABS
75.0000 mg | ORAL_TABLET | Freq: Every day | ORAL | Status: DC
  Administered 2023-03-04 – 2023-03-07 (×4): 75 mg via ORAL
  Filled 2023-03-04 (×4): qty 1

## 2023-03-04 MED ORDER — SERTRALINE HCL 100 MG PO TABS
100.0000 mg | ORAL_TABLET | Freq: Every morning | ORAL | Status: DC
  Administered 2023-03-04 – 2023-03-07 (×4): 100 mg via ORAL
  Filled 2023-03-04 (×4): qty 1

## 2023-03-04 MED ORDER — GADOBUTROL 1 MMOL/ML IV SOLN
7.0000 mL | Freq: Once | INTRAVENOUS | Status: AC | PRN
  Administered 2023-03-04: 7 mL via INTRAVENOUS

## 2023-03-04 MED ORDER — HYDROCODONE-ACETAMINOPHEN 5-325 MG PO TABS
1.0000 | ORAL_TABLET | Freq: Four times a day (QID) | ORAL | Status: DC | PRN
  Administered 2023-03-05 – 2023-03-07 (×3): 1 via ORAL
  Filled 2023-03-04 (×3): qty 1

## 2023-03-04 MED ORDER — SODIUM CHLORIDE 0.9 % IV BOLUS
1000.0000 mL | Freq: Once | INTRAVENOUS | Status: AC
  Administered 2023-03-04: 1000 mL via INTRAVENOUS

## 2023-03-04 MED ORDER — ACETAMINOPHEN 500 MG PO TABS: 1000.0000 mg | ORAL_TABLET | Freq: Every day | ORAL | Status: DC | PRN

## 2023-03-04 MED ORDER — POLYETHYLENE GLYCOL 3350 17 G PO PACK
17.0000 g | PACK | Freq: Every day | ORAL | Status: DC
  Administered 2023-03-04 – 2023-03-05 (×2): 17 g via ORAL
  Filled 2023-03-04 (×4): qty 1

## 2023-03-04 MED ORDER — ATORVASTATIN CALCIUM 40 MG PO TABS
40.0000 mg | ORAL_TABLET | Freq: Every morning | ORAL | Status: DC
  Administered 2023-03-05 – 2023-03-07 (×3): 40 mg via ORAL
  Filled 2023-03-04 (×3): qty 1

## 2023-03-04 MED ORDER — ENOXAPARIN SODIUM 40 MG/0.4ML IJ SOSY
40.0000 mg | PREFILLED_SYRINGE | INTRAMUSCULAR | Status: DC
  Administered 2023-03-04 – 2023-03-06 (×3): 40 mg via SUBCUTANEOUS
  Filled 2023-03-04 (×3): qty 0.4

## 2023-03-04 MED ORDER — KETOROLAC TROMETHAMINE 30 MG/ML IJ SOLN
30.0000 mg | Freq: Once | INTRAMUSCULAR | Status: AC
  Administered 2023-03-04: 30 mg via INTRAVENOUS
  Filled 2023-03-04: qty 1

## 2023-03-04 MED ORDER — SODIUM CHLORIDE 0.9 % IV SOLN
1.0000 g | Freq: Once | INTRAVENOUS | Status: AC
  Administered 2023-03-04: 1 g via INTRAVENOUS
  Filled 2023-03-04: qty 10

## 2023-03-04 MED ORDER — INSULIN ASPART 100 UNIT/ML IJ SOLN
8.0000 [IU] | Freq: Once | INTRAMUSCULAR | Status: AC
  Administered 2023-03-04: 8 [IU] via SUBCUTANEOUS

## 2023-03-04 MED ORDER — INSULIN ASPART 100 UNIT/ML IJ SOLN
0.0000 [IU] | Freq: Three times a day (TID) | INTRAMUSCULAR | Status: DC
  Administered 2023-03-05 – 2023-03-06 (×2): 2 [IU] via SUBCUTANEOUS
  Administered 2023-03-06 – 2023-03-07 (×2): 3 [IU] via SUBCUTANEOUS

## 2023-03-04 MED ORDER — METHOCARBAMOL 750 MG PO TABS
750.0000 mg | ORAL_TABLET | Freq: Four times a day (QID) | ORAL | Status: DC | PRN
  Administered 2023-03-05 – 2023-03-06 (×2): 750 mg via ORAL
  Filled 2023-03-04 (×4): qty 1

## 2023-03-04 MED ORDER — LOSARTAN POTASSIUM 50 MG PO TABS
50.0000 mg | ORAL_TABLET | Freq: Every morning | ORAL | Status: DC
  Administered 2023-03-04 – 2023-03-06 (×3): 50 mg via ORAL
  Filled 2023-03-04 (×3): qty 1

## 2023-03-04 MED ORDER — HYDROMORPHONE HCL 1 MG/ML IJ SOLN
0.5000 mg | INTRAMUSCULAR | Status: DC | PRN
  Administered 2023-03-04 – 2023-03-05 (×3): 1 mg via INTRAVENOUS
  Administered 2023-03-06: 0.5 mg via INTRAVENOUS
  Filled 2023-03-04 (×5): qty 1

## 2023-03-04 MED ORDER — HYDROMORPHONE HCL 1 MG/ML IJ SOLN
1.0000 mg | Freq: Once | INTRAMUSCULAR | Status: AC
  Administered 2023-03-04: 1 mg via INTRAVENOUS
  Filled 2023-03-04: qty 1

## 2023-03-04 MED ORDER — FOLIC ACID 1 MG PO TABS
1.0000 mg | ORAL_TABLET | Freq: Every morning | ORAL | Status: DC
  Administered 2023-03-04 – 2023-03-07 (×4): 1 mg via ORAL
  Filled 2023-03-04 (×4): qty 1

## 2023-03-04 MED ORDER — LEVOTHYROXINE SODIUM 50 MCG PO TABS
50.0000 ug | ORAL_TABLET | Freq: Every day | ORAL | Status: DC
  Administered 2023-03-05 – 2023-03-07 (×3): 50 ug via ORAL
  Filled 2023-03-04 (×3): qty 1

## 2023-03-04 MED ORDER — BUPROPION HCL ER (XL) 150 MG PO TB24
150.0000 mg | ORAL_TABLET | Freq: Every morning | ORAL | Status: DC
  Administered 2023-03-04 – 2023-03-07 (×4): 150 mg via ORAL
  Filled 2023-03-04 (×4): qty 1

## 2023-03-04 NOTE — Progress Notes (Signed)
Pt escorted to MRI in her bed.

## 2023-03-04 NOTE — Evaluation (Addendum)
Occupational Therapy Evaluation Patient Details Name: Michelle Nicholson MRN: [id number] DOB: March 31, 1961 Today's Date: 03/04/2023   History of Present Illness Michelle Nicholson is a 62 yo female who presented after a fall with R hip pain. X-rays and CT scan of the hip are unremarkable, urinalysis is consistent with UTI. Of note pt had a fall 8/10 with L patellar fx. PMHx: none on file.   Clinical Impression   Michelle Nicholson was evaluated s/p the above admission list. She lives alone and is mod I with SPC at baseline, per pt she fell 10 days ago and fractured her L knee and is WBAT with KI. Upon evaluation the pt was limited by significant R hip pain with any passive or active movement. Overall she was max A for rolling at bed level and could not tolerate rolling all the way to the R or L. Due to the deficits listed below the pt also needs total A for LB ADLs and set up for UB ADLs at bed level. Pt will benefit from continued acute OT services and skilled inpatient follow up therapy, <3 hours/day if pain remains uncontrolled.         If plan is discharge home, recommend the following: A lot of help with walking and/or transfers;A lot of help with bathing/dressing/bathroom;Assistance with cooking/housework;Direct supervision/assist for medications management;Direct supervision/assist for financial management;Help with stairs or ramp for entrance;Assist for transportation    Functional Status Assessment  Patient has had a recent decline in their functional status and demonstrates the ability to make significant improvements in function in a reasonable and predictable amount of time.  Equipment Recommendations  None recommended by OT (defer)       Precautions / Restrictions Precautions Precautions: Fall Precaution Comments: fall 10 days ago with L patellar fx, KI, WBAT. Required Braces or Orthoses: Knee Immobilizer - Left Knee Immobilizer - Left: On at all times Restrictions Weight Bearing Restrictions: Yes LLE  Weight Bearing: Weight bearing as tolerated      Mobility Bed Mobility Overal bed mobility: Needs Assistance Bed Mobility: Rolling Rolling: Max assist, Used rails         General bed mobility comments: rolled R and L, pt unable to get full on her sides. screaming in pain.    Transfers                   General transfer comment: unable to attmpt due to pain      Balance Overall balance assessment: Needs assistance           ADL either performed or assessed with clinical judgement   ADL Overall ADL's : Needs assistance/impaired Eating/Feeding: Independent   Grooming: Set up;Bed level   Upper Body Bathing: Set up;Bed level   Lower Body Bathing: Total assistance   Upper Body Dressing : Set up;Bed level   Lower Body Dressing: Total assistance   Toilet Transfer: Total assistance   Toileting- Clothing Manipulation and Hygiene: Total assistance       Functional mobility during ADLs: Total assistance General ADL Comments: bed level evaluation, pt unable to tolerate any movement of the R hip.     Vision Baseline Vision/History: 0 No visual deficits Vision Assessment?: No apparent visual deficits     Perception Perception: Not tested       Praxis Praxis: Not tested       Pertinent Vitals/Pain Pain Assessment Pain Assessment: 0-10 Pain Score: 10-Worst pain ever Pain Location: R hip Pain Descriptors / Indicators: Discomfort Pain Intervention(s): Limited activity  within patient's tolerance     Extremity/Trunk Assessment Upper Extremity Assessment Upper Extremity Assessment: Overall WFL for tasks assessed   Lower Extremity Assessment Lower Extremity Assessment: Defer to PT evaluation   Cervical / Trunk Assessment Cervical / Trunk Assessment: Normal   Communication Communication Communication: No apparent difficulties   Cognition Arousal: Alert, Lethargic Behavior During Therapy: Flat affect, Anxious Overall Cognitive Status: Difficult to  assess         General Comments: required stimulation to stay away, otherwise was quick to fall asleep and snore. pt very anxious in anticipation of R hip pain with movement. Able to recall L knee restrictions and follow most simple commands. Would benefit from higher level assessment once pt is more awake     General Comments  VSS. pt on 2L O2, denies use at baseline     Home Living Family/patient expects to be discharged to:: Private residence Living Arrangements: Alone Available Help at Discharge: Family;Friend(s);Available PRN/intermittently Type of Home: House Home Access: Stairs to enter [employer] of Steps: 3 Entrance Stairs-Rails: Right;Left Home Layout: Two level;Able to live on main level with bedroom/bathroom     Bathroom Shower/Tub: Chief Strategy Officer: Standard Bathroom Accessibility: Yes   Home Equipment: Cane - single point          Prior Functioning/Environment Prior Level of Function : Independent/Modified Independent;Driving             Mobility Comments: SPC since fall 10 days ago, per pt report she is to maintain L KI and is WBAT ADLs Comments: per report, mod I        OT Problem List: Decreased strength;Decreased range of motion;Decreased activity tolerance;Impaired balance (sitting and/or standing);Decreased safety awareness;Decreased knowledge of use of DME or AE;Decreased knowledge of precautions;Pain      OT Treatment/Interventions: Self-care/ADL training;Neuromuscular education;DME and/or AE instruction;Therapeutic activities;Patient/family education;Balance training    OT Goals(Current goals can be found in the care plan section) Acute Rehab OT Goals Patient Stated Goal: less pain OT Goal Formulation: With patient Time For Goal Achievement: 03/18/23 Potential to Achieve Goals: Good ADL Goals Pt Will Perform Grooming: with set-up;sitting Pt Will Perform Lower Body Dressing: with mod assist;sit to/from  stand Pt Will Transfer to Toilet: with mod assist;stand pivot transfer;bedside commode Additional ADL Goal #1: pt will complete bed mobility with mod A as a precursor to ADLs  OT Frequency: Min 1X/week       AM-PAC OT "6 Clicks" Daily Activity     Outcome Measure Help from another person eating meals?: None Help from another person taking care of personal grooming?: A Little Help from another person toileting, which includes using toliet, bedpan, or urinal?: Total Help from another person bathing (including washing, rinsing, drying)?: A Lot Help from another person to put on and taking off regular upper body clothing?: A Little Help from another person to put on and taking off regular lower body clothing?: Total 6 Click Score: 14   End of Session Equipment Utilized During Treatment: Oxygen Nurse Communication: Mobility status  Activity Tolerance: Patient limited by pain Patient left: in bed;with call bell/phone within reach;with bed alarm set  OT Visit Diagnosis: Unsteadiness on feet (R26.81);Other abnormalities of gait and mobility (R26.89);Muscle weakness (generalized) (M62.81);Pain;Repeated falls (R29.6);History of falling (Z91.81)                Time: 4010-2725 OT Time Calculation (min): 18 min Charges:  OT General Charges $OT Visit: 1 Visit OT Evaluation $OT Eval Moderate Complexity:  1 Mod  Derenda Mis, OTR/L Acute Rehabilitation Services Office [phone] Secure Chat Communication Preferred   Michelle Nicholson 03/04/2023, 2:01 PM

## 2023-03-04 NOTE — ED Notes (Signed)
Called patient's significant other and let him know that [facility] was going to transport her to [facility]

## 2023-03-04 NOTE — Progress Notes (Signed)
PT Cancellation Note  Patient Details Name: Michelle Nicholson MRN: [id number] DOB: 1961/01/18   Cancelled Treatment:    Reason Eval/Treat Not Completed: Pain limiting ability to participate  Crying out in pain. Will defer formal PT evaluation at this time. Nursing reaching out to her primary nurse for medication. Will try again tomorrow.  Kathlyn Sacramento, PT, DPT [facility]  Rehabilitation Services Physical Therapist Office: [phone] Website: [email or web address]  Berton Mount 03/04/2023, 4:59 PM

## 2023-03-04 NOTE — ED Notes (Signed)
[facility] called for transport @ 06:16am No ETA... Pt will tranfer after 7am

## 2023-03-04 NOTE — ED Notes (Signed)
After Dilaudid given pt's sats dropped to mid 80's, placed on 2L and pt is mouth breathing so placed in her mouth.  Pt is lying flat can not tolerate HOB being raised at all r/t pain

## 2023-03-04 NOTE — Plan of Care (Signed)
Patient alert/oriented X4. Patient compliant with medication administration and weaned to 1L nasal cannula. Will continue to wean. Patient administered dilaudid as needed for pain. [place] remains in place, VSS, will continue to monitor.  Problem: Education: Goal: Ability to describe self-care measures that may prevent or decrease complications (Diabetes Survival Skills Education) will improve Outcome: Progressing   Problem: Education: Goal: Individualized Educational Video(s) Outcome: Progressing   Problem: Coping: Goal: Ability to adjust to condition or change in health will improve Outcome: Progressing   Problem: Fluid Volume: Goal: Ability to maintain a balanced intake and output will improve Outcome: Progressing   Problem: Health Behavior/Discharge Planning: Goal: Ability to identify and utilize available resources and services will improve Outcome: Progressing   Problem: Health Behavior/Discharge Planning: Goal: Ability to manage health-related needs will improve Outcome: Progressing   Problem: Metabolic: Goal: Ability to maintain appropriate glucose levels will improve Outcome: Progressing   Problem: Nutritional: Goal: Maintenance of adequate nutrition will improve Outcome: Progressing   Problem: Nutritional: Goal: Progress toward achieving an optimal weight will improve Outcome: Progressing   Problem: Skin Integrity: Goal: Risk for impaired skin integrity will decrease Outcome: Progressing   Problem: Tissue Perfusion: Goal: Adequacy of tissue perfusion will improve Outcome: Progressing

## 2023-03-04 NOTE — Progress Notes (Addendum)
Plan of Care Note for accepted transfer  Patient: Michelle Nicholson              ZOX:[id number]  DOA: 03/03/2023     Facility requesting transfer: [facility] emergency department Requesting Provider: Dr. Charlean Sanfilippo  Reason for transfer: Uncontrolled pain of the right hip and left patella due to fall. Uncontrolled blood glucose blood glucose without concern for DKA and management for UTI  Facility course:  62 year old female medical history of CVA currently on aspirin and Plavix, history of PAD with intermittent claudication, DM type I, essential hypertension, bilateral carotid stenosis s/p endarterectomy 12/02/2021, chronic alcohol use, reactive airway disease presented to emergency department referred from outpatient sports clinic due to persistent left-sided patellar pain and right-sided hip pain. Patient reported she sustained a fall 1 week ago and injured on the left patella and also developed right-sided hip pain.   At presentation to ED patient is hemodynamically stable.  BMP showed low sodium 129 (hyperglycemia related hyponatremia), potassium 4.2, chloride 98, CO2 23, bicarb 20, BUN 18, creatinine 1.25 (around patient baseline), calcium 8.2, GFR 48 and anion gap 8. CBC grossly unremarkable. UA showed evidence of UTI.  Unclear patient is symptomatic or not.  CT right hip without contrast did not showed any acute fracture or dislocation. X-ray hip did not showed any acute abnormality.   ED physician Dr. Charlean Sanfilippo reported patient has excruciating right-sided hip pain and she is uncomfortable and allowing to touch anyone  In the ED patient has been given 1 dose of ceftriaxone, Dilaudid, Valium, Toradol, Percocet and Flexeril.  For elevated blood glucose patient was given NovoLog 8 units once.   Plan of care: Patient need MRI of the left patella and possible MRI of the right hip, pain control and need PT/OT evaluation.  Need hyperglycemia control.  (Unclear patient is compliant with insulin or  not).  If patient has symptomatic UTI need to continue treatment as well.   The patient is accepted for admission to Med-surg  unit, at [facility] at [facility].   Check [email or web address] for on-call coverage.  [facility] will assume care on arrival to accepting facility. Until arrival, medical decision making responsibilities remain with the EDP.  However, [facility] available 24/7 for questions and assistance.   Nursing staff please page [facility] Admits and Consults [phone]) as soon as the patient arrives to the hospital.    Author: Tereasa Coop, MD  03/04/2023  [facility]

## 2023-03-04 NOTE — H&P (Signed)
History and Physical    Michelle Nicholson WUX:[id number] DOB: 1960-08-22 DOA: 03/03/2023  PCP: Bailey Mech, PA-C   Patient coming from: Home    Chief Complaint: Hip pain  HPI: Michelle Nicholson is a 62 y.o. female with medical history significant of chronic ambulatory problem, CVA, peripheral artery disease, diabetes type 1, hypertension, bilateral carotid stenosis status post endarterectomy,tobacco use, reactive airway disease who presented to the emergency department at [facility] with complaint of right hip pain, left patella.  She was actually referred from outpatient sports clinic due to persistent left-sided patellar pain, right hip pain.  As per the report, she fell about a week ago and fractures her left patella .she follows with Dr. Shawnie Dapper at [facility] orthopedics.  She was put in a knee brace and was recommended for outpatient follow-up.  She was walking with her left knee brace yesterday and attempted to step up a curb  when she developed severe  right sided hip pain and became unable to walk.  At baseline, patient is able to walk without any problem.  Patient lives alone.  Drives car. On presentation she was hemodynamically stable.  Lab work showed sodium of 129, potassium 4.2, creatinine 1.25.  UA suspicious of UTI.  X-ray of the hip did not show any fracture or dislocation.  CT right hip without contrast did not show any acute fracture or dislocation.  Since patient had persistent pain on the right hip, she was decided to be admitted. Patient seen and examined at the bedside this afternoon.  During my evaluation, she was sleepy/drowsy most likely from the narcotics.  Since she was still complaining of right-sided hip pain.  She was overall alert and oriented.  There was no report of chest pain, cough, shortness of breath, palpitation, abdomen pain, nausea, vomiting, dysuria, headache, or diarrhea  ED Course: Continued to have pain on the right hip in  the emergency  department.  There was concern for UTI as well.  Given a dose of ceftriaxone.  Also given Dilaudid, Valium, Toradol, Percocet, Flexeril.  Blood sugars were fluctuating in the emergency department.  Review of Systems: As per HPI otherwise 10 point review of systems negative.    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 Her smoking use included cigarettes. She has never used smokeless tobacco. She reports that she does not drink alcohol and does not use drugs.  Allergies  Allergen Reactions   Dorzolamide Hcl-Timolol Mal Itching and Other (See Comments)    Makes her eyes burn  Conjunctival Injection with Burning  Visual Acuity Blurry  Other Reaction(s): Other  Makes her eyes burn  Conjunctival Injection with Burning  Visual Acuity Blurry  Makes her eyes burn  VA blurry  Conjunctival Injection with Burning  Visual Acuity with Blurred Vision  Conjunctival Injection with Burning    Visual Acuity with Blurred Vision    Makes her eyes burn Conjunctival Injection  with Burning Visual Acuity Blurry  Conjunctival Injection with Burning, , Visual Acuity with Blurred Vision, , Makes her eyes burn Conjunctival Injection with Burning Visual Acuity Blurry   Brimonidine Rash and Other (See Comments)    Follicular conjunctivitis   Netarsudil-Latanoprost Other (See Comments)    Redness, tearing, burning  Other Reaction(s): Other (See Comments)  Redness, tearing, burning  Redness, tearing, burning  Redness, tearing, burning Redness, tearing, burning    Family History  Problem Relation Age of Onset   Cancer Mother    Hypertension Father    Diabetes Father    Cancer Sister    Diabetes Sister    Diabetes Brother    Kidney failure Brother    CAD Brother    Diabetes Other      Prior to Admission medications   Medication Sig Start Date End Date Taking? Authorizing Provider  acetaminophen (TYLENOL) 500 MG tablet Take 1,000 mg by mouth daily as needed for headache (pain).    [provider]  albuterol (VENTOLIN HFA) 108 (90 Base) MCG/ACT inhaler Inhale 1-2 puffs into the lungs every 6 (six) hours as needed for wheezing or shortness of breath.    [provider]  aspirin 325 MG tablet Take 1 tablet (325 mg total) by mouth daily. 11/30/21   Danford, Earl Lites, MD  atorvastatin (LIPITOR) 40 MG tablet Take 40 mg by mouth every morning. 04/01/21   [provider]  buPROPion (WELLBUTRIN XL) 150 MG 24 hr tablet Take 150 mg by mouth every morning. 03/05/21   [provider]  Calcium-Magnesium-Zinc (CAL-MAG-ZINC PO) Take 1 tablet by mouth every morning.    [provider]  clopidogrel (PLAVIX) 75 MG tablet Take 1 tablet by mouth once daily 12/04/22   Victorino Sparrow, MD  estradiol (ESTRACE) 0.1 MG/GM vaginal cream Place 1 Applicatorful vaginally at bedtime.    [provider]  ferrous sulfate 325 (65 FE) MG tablet Take 1 tablet (325 mg total) by mouth 2 (two) times daily with a meal. Patient taking  differently: Take 325 mg by mouth every morning. 04/25/21 12/27/21  Lanae Boast, MD  fluorouracil (EFUDEX) 5 % cream Apply 1 application. topically daily as needed (skin cancer breakouts).    [provider]  fluticasone-salmeterol (ADVAIR) 100-50 MCG/ACT AEPB Inhale 1 puff into the lungs daily as needed (shortness of breath).    [provider]  folic acid (FOLVITE) 1 MG tablet Take 1 mg by mouth every morning.    [provider]  HYDROcodone-acetaminophen (NORCO/VICODIN) 5-325 MG tablet Take 1 tablet by mouth every 6 (six) hours as needed for severe pain. 02/22/23   Pollyann Savoy, MD  insulin aspart (NOVOLOG) 100 UNIT/ML FlexPen Inject 4 Units into the skin See admin instructions. Inject 4 units subcutaneously three times daily after meals - plus sliding scale adjustment - add 1 unit for every 50 units over 200    [provider]  Insulin Degludec (TRESIBA FLEXTOUCH) 100 UNIT/ML  SOPN Inject 7 Units into the skin daily. Patient taking differently: Inject 16 Units into the skin daily after breakfast. 12/10/15   Rhetta Mura, MD  ipratropium-albuterol (DUONEB) 0.5-2.5 (3) MG/3ML SOLN Take 3 mLs by nebulization every 6 (six) hours as needed for up to 15 days. 04/25/21 11/28/21  Lanae Boast, MD  latanoprost (XALATAN) 0.005 % ophthalmic solution Place 1 drop into both eyes at bedtime. 04/10/21   [provider]  levothyroxine (SYNTHROID) 50 MCG tablet Take 50 mcg by mouth daily before breakfast.    [provider]  losartan (COZAAR) 50 MG tablet Take 50 mg by mouth every morning.    [provider]  Multiple Vitamin (MULTIVITAMIN WITH MINERALS) TABS tablet Take 1 tablet by mouth every morning.    [provider]  Pancrelipase, Lip-Prot-Amyl, 24000-76000 units CPEP Take 2 capsules by mouth daily after breakfast.    [provider]  pregabalin (LYRICA) 300 MG capsule Take 300 mg by mouth 2 (two) times daily. 02/05/21    [provider]  sertraline (ZOLOFT) 100 MG tablet Take 100 mg by mouth every morning.    [provider]  sodium chloride 1 g tablet Take 1 g by mouth 3 (three) times daily as needed (brain fog from sodium insufficiency). 12/24/20   [provider]  Vitamin D, Ergocalciferol, (DRISDOL) 1.25 MG (50000 UNIT) CAPS capsule Take 50,000 Units by mouth every Sunday. 01/28/21   [provider]    Physical Exam: Vitals:   03/04/23 0500 03/04/23 0600 03/04/23 0755 03/04/23 0935  BP: 135/62 137/60 (!) 139/49 (!) 116/59  Pulse: 65 63 63 62  Resp: 14 16 20 18   Temp: 98.3 F (36.8 C) 98.3 F (36.8 C) 97.6 F (36.4 C) 98.2 F (36.8 C)  TempSrc: Oral Oral Oral   SpO2: 96% 97% 96% 100%  Weight:      Height:        Constitutional: sleepy,drowsy Vitals:   03/04/23 0500 03/04/23 0600 03/04/23 0755 03/04/23 0935  BP: 135/62 137/60 (!) 139/49 (!) 116/59  Pulse: 65 63 63 62  Resp: 14 16 20 18   Temp: 98.3 F (36.8 C) 98.3 F (36.8 C) 97.6 F (36.4 C) 98.2 F (36.8 C)  TempSrc: Oral Oral Oral   SpO2: 96% 97% 96% 100%  Weight:      Height:       Eyes: PERRL, lids and conjunctivae normal ENMT: Mucous membranes are moist.  Neck: normal, supple, no masses, no thyromegaly Respiratory: clear to auscultation bilaterally, no wheezing, no crackles. Normal respiratory effort. No accessory muscle use.  Cardiovascular: Regular rate and rhythm, no murmurs / rubs / gallops. No extremity edema.  Abdomen: no tenderness, no masses palpated. No hepatosplenomegaly. Bowel sounds positive.  Musculoskeletal: no clubbing / cyanosis. No joint deformity upper and lower extremities.  Tenderness on the right hip Skin: no rashes, lesions, ulcers. No induration Neurologic: CN 2-12 grossly intact.  Strength 5/5 in all 4.  Psychiatric: Normal judgment and insight. Alert and oriented x 3. Normal mood.   Foley Catheter:None  Labs on Admission: I have personally reviewed following labs  and imaging studies  CBC: Recent Labs  Lab 03/04/23 0024  WBC 10.2  NEUTROABS 8.1*  HGB 11.9*  HCT 36.3  MCV 89.2  PLT 237   Basic Metabolic Panel: Recent Labs  Lab 03/04/23 0014 03/04/23 1028  NA 129* 137  K 4.2 4.1  CL 98 101  CO2 23 23  GLUCOSE 400* 81  BUN 18 17  CREATININE 1.25* 1.30*  CALCIUM 8.2* 8.7*   GFR: Estimated Creatinine Clearance: 46.3 mL/min (A) (by C-G formula based on SCr of 1.3 mg/dL (H)). Liver Function Tests: No results for input(s): "AST", "ALT", "ALKPHOS", "BILITOT", "PROT", "ALBUMIN" in the last 168 hours. No results for input(s): "LIPASE", "AMYLASE" in the last 168 hours. No results for input(s): "AMMONIA" in the last 168 hours. Coagulation Profile: No results for input(s): "INR", "PROTIME" in the last 168 hours. Cardiac Enzymes: No results for input(s): "CKTOTAL", "CKMB", "CKMBINDEX", "TROPONINI" in the last 168 hours. BNP (last 3 results) No results for input(s): "PROBNP" in the last 8760 hours. HbA1C: No results for input(s): "HGBA1C" in the last 72 hours. CBG: Recent Labs  Lab 03/03/23 2300 03/04/23 0753 03/04/23 1258  GLUCAP 397* 108* 80   Lipid Profile: No results for input(s): "CHOL", "HDL", "LDLCALC", "TRIG", "CHOLHDL", "LDLDIRECT" in the last 72 hours. Thyroid Function Tests: No results for input(s): "TSH", "T4TOTAL", "FREET4", "T3FREE", "THYROIDAB" in the last 72 hours. Anemia Panel: No results for input(s): "VITAMINB12", "FOLATE", "FERRITIN", "TIBC", "IRON", "RETICCTPCT" in the last 72 hours. Urine analysis:    Component Value Date/Time   COLORURINE YELLOW 03/03/2023 0046   APPEARANCEUR HAZY (A) 03/03/2023 0046   LABSPEC 1.010 03/03/2023 0046   PHURINE 5.5 03/03/2023 0046   GLUCOSEU >=500 (A) 03/03/2023 0046   HGBUR NEGATIVE 03/03/2023 0046   BILIRUBINUR NEGATIVE 03/03/2023 0046   KETONESUR NEGATIVE 03/03/2023 0046   PROTEINUR NEGATIVE 03/03/2023 0046   UROBILINOGEN 1.0 12/29/2012 1628   NITRITE POSITIVE (A)  03/03/2023 0046   LEUKOCYTESUR TRACE (A) 03/03/2023 0046    Radiological Exams on Admission: CT Hip Right Wo Contrast  Result Date: 03/03/2023 CLINICAL DATA:  Hip pain, stress fracture suspected. EXAM: CT OF THE RIGHT HIP WITHOUT CONTRAST TECHNIQUE: Multidetector CT imaging of the right hip was performed according to the standard protocol. Multiplanar CT image reconstructions were also generated. RADIATION DOSE REDUCTION: This exam was performed according to the departmental dose-optimization program which includes automated exposure control, adjustment of the mA and/or kV according to patient size and/or use of iterative reconstruction technique. COMPARISON:  Radiograph 03/03/2023 and CT abdomen and pelvis 01/26/2022 FINDINGS: Bones/Joint/Cartilage No acute fracture or dislocation. Mild right hip degenerative arthritis. Degenerative changes at the pubic symphysis. Ligaments Suboptimally assessed by CT. Muscles and Tendons No acute abnormality. Soft tissues Soft tissue thickening/edema and coarse calcifications within the posteromedial thigh/gluteal soft tissues. Gas within the bladder. IMPRESSION: 1. No acute fracture or dislocation. 2. Gas within the bladder. Correlate for recent instrumentation. Electronically Signed   By: Minerva Fester M.D.   On: 03/03/2023 23:19   DG Hip Unilat W or Wo Pelvis 2-3 Views Right  Result Date: 03/03/2023 CLINICAL DATA:  Right hip pain EXAM: DG HIP (WITH OR WITHOUT PELVIS) 2-3V RIGHT COMPARISON:  None Available. FINDINGS: Hip joints and SI joints symmetric. No acute bony abnormality. Specifically, no fracture, subluxation, or dislocation. IMPRESSION: No acute bony abnormality. Electronically Signed   By: Charlett Nose M.D.   On: 03/03/2023 21:34     Assessment/Plan Principal Problem:   Right hip pain Active Problems:   Recurrent UTI   Uncontrolled type 2 diabetes mellitus with hyperglycemia, with long-term current use of insulin (HCC)   Essential hypertension    Dyslipidemia   Carotid artery stenosis   Pain in right hip  Recurrent falls/right hip pain/left patellar fracture: Report of fall about a week ago resulting in fractured patella of the left.  Follows with Dr. Shawnie Dapper at [facility]  orthopedics.  Has a knee brace on the left.  Developed severe pain on the right hip while attempting to step up a curb yesterday while wearing the brace. X-ray of the hip did not show any fracture or dislocation.  CT right hip without contrast did not show any acute fracture or dislocation .continue pain management, supportive care.  Will get MRI of the right hip.  PT OT consulted  Concern for UTI: UA was suspicious for UTI.  Urine culture ordered.  Patient does not have any leukocytosis or dysuria.  Will hold antibiotics for now.  Diabetes type 2: Takes insulin at home.  Currently on sliding scale.  Will check A1c level  Hyperlipidemia: Takes Lipitor.  History of CVA/carotid artery stenosis: Status post left CEA.  Takes Plavix, Lipitor.  Follows with vascular surgery  Hypothyroidism: Continue levothyroxine  CKD stage IIIa: Currently kidney function at baseline. Creatinine 1.3.  Hypertension: Continue losartan.  Monitor blood pressure.  Currently stable  History of anxiety/depression: On Zoloft, bupropion     Severity of Illness: The appropriate patient status for this patient is OBSERVATION. Observation status is judged to be reasonable and necessary in order to provide the required intensity of service to ensure the patient's safety. The patient's presenting symptoms, physical exam findings, and initial radiographic and laboratory data in the context of their medical condition is felt to place them at decreased risk for further clinical deterioration. Furthermore, it is anticipated that the patient will be medically stable for discharge from the hospital within 2 midnights of admission.    DVT prophylaxis: Lovenox Code Status: Full code Family Communication:  None at the bedside Consults called: None     Burnadette Pop MD [facility]  03/04/2023, 3:01 PM

## 2023-03-04 NOTE — ED Provider Notes (Signed)
  Physical Exam  BP (!) 133/56   Pulse 66   Temp 98.2 F (36.8 C)   Resp (!) 24   Ht 5\' 9"  (1.753 m)   Wt 69.3 kg   SpO2 97%   BMI 22.57 kg/m   Physical Exam Vitals and nursing note reviewed.  Constitutional:      General: She is not in acute distress.    Appearance: She is well-developed. She is not diaphoretic.  HENT:     Head: Normocephalic and atraumatic.  Cardiovascular:     Rate and Rhythm: Normal rate and regular rhythm.     Heart sounds: No murmur heard.    No friction rub. No gallop.  Pulmonary:     Effort: Pulmonary effort is normal. No respiratory distress.     Breath sounds: Normal breath sounds. No wheezing.  Abdominal:     General: Bowel sounds are normal. There is no distension.     Palpations: Abdomen is soft.     Tenderness: There is no abdominal tenderness.  Musculoskeletal:        General: Normal range of motion.     Cervical back: Normal range of motion and neck supple.     Comments: There is severe pain noted in the right hip with any range of motion.  DP pulses are palpable and motor and sensation are intact throughout the entire foot.  Skin:    General: Skin is warm and dry.  Neurological:     General: No focal deficit present.     Mental Status: She is alert and oriented to person, place, and time.     Procedures  Procedures  ED Course / MDM    Medical Decision Making Amount and/or Complexity of Data Reviewed Labs: ordered. Radiology: ordered.  Risk Prescription drug management. Decision regarding hospitalization.   Care assumed from Dr. Jacqulyn Bath at shift change.  Patient brought here for evaluation of severe pain in her right hip.  She fell once approximately 10 days ago and fractured her patella.  She has been wearing a knee brace since.  Today she attempted to step up a curb when she developed severe pain in the right hip and has been unable to ambulate ever since.  X-rays and CT scan of the hip are unremarkable.  Laboratory studies  have returned as basically unremarkable, but do reveal an elevated glucose of 400 and urinalysis is consistent with UTI.  Patient has been given IV NovoLog for her blood sugar and Rocephin for the UTI.  As patient is unable to ambulate, I feel as though she will require admission.  I have spoken with the hospitalist who agrees to admit for correction of her blood sugar, treatment of UTI, and patient may need an MRI of her hip if not improving.       Geoffery Lyons, MD 03/04/23 [phone]

## 2023-03-05 ENCOUNTER — Observation Stay (HOSPITAL_COMMUNITY): Payer: BC Managed Care – PPO

## 2023-03-05 DIAGNOSIS — M25551 Pain in right hip: Secondary | ICD-10-CM | POA: Diagnosis not present

## 2023-03-05 LAB — SYNOVIAL CELL COUNT + DIFF, W/ CRYSTALS
Crystals, Fluid: NONE SEEN
Eosinophils-Synovial: 0 % (ref 0–1)
Lymphocytes-Synovial Fld: 0 % (ref 0–20)
Monocyte-Macrophage-Synovial Fluid: 6 % — ABNORMAL LOW (ref 50–90)
Neutrophil, Synovial: 94 % — ABNORMAL HIGH (ref 0–25)
WBC, Synovial: 27750 /mm3 — ABNORMAL HIGH (ref 0–200)

## 2023-03-05 LAB — BASIC METABOLIC PANEL
Anion gap: 8 (ref 5–15)
BUN: 20 mg/dL (ref 8–23)
CO2: 21 mmol/L — ABNORMAL LOW (ref 22–32)
Calcium: 7.9 mg/dL — ABNORMAL LOW (ref 8.9–10.3)
Chloride: 105 mmol/L (ref 98–111)
Creatinine, Ser: 1.36 mg/dL — ABNORMAL HIGH (ref 0.44–1.00)
GFR, Estimated: 44 mL/min — ABNORMAL LOW (ref >60)
Glucose, Bld: 135 mg/dL — ABNORMAL HIGH (ref 70–99)
Potassium: 4.3 mmol/L (ref 3.5–5.1)
Sodium: 134 mmol/L — ABNORMAL LOW (ref 135–145)

## 2023-03-05 LAB — URINE CULTURE: Culture: <10000 — AB

## 2023-03-05 LAB — GLUCOSE, CAPILLARY
Glucose-Capillary: 93 mg/dL (ref 70–99)
Glucose-Capillary: 177 mg/dL — ABNORMAL HIGH (ref 70–99)
Glucose-Capillary: 143 mg/dL — ABNORMAL HIGH (ref 70–99)
Glucose-Capillary: 228 mg/dL — ABNORMAL HIGH (ref 70–99)

## 2023-03-05 MED ORDER — METHYLPREDNISOLONE ACETATE 40 MG/ML INJ SUSP (RADIOLOG: 80.0000 mg | Freq: Once | INTRAMUSCULAR | Status: DC

## 2023-03-05 MED ORDER — IOHEXOL 180 MG/ML  SOLN: 10.0000 mL | Freq: Once | INTRAMUSCULAR | Status: DC | PRN

## 2023-03-05 MED ORDER — BUPIVACAINE HCL (PF) 0.25 % IJ SOLN
5.0000 mL | Freq: Once | INTRAMUSCULAR | Status: AC
  Administered 2023-03-05: 5 mL via INTRA_ARTICULAR
  Filled 2023-03-05: qty 10

## 2023-03-05 MED ORDER — BUPIVACAINE HCL (PF) 0.25 % IJ SOLN
INTRAMUSCULAR | Status: AC
  Filled 2023-03-05: qty 30

## 2023-03-05 MED ORDER — METHYLPREDNISOLONE ACETATE 80 MG/ML IJ SUSP
INTRAMUSCULAR | Status: AC
  Filled 2023-03-05: qty 1

## 2023-03-05 MED ORDER — LIDOCAINE HCL (PF) 1 % IJ SOLN
INTRAMUSCULAR | Status: AC
  Filled 2023-03-05: qty 5

## 2023-03-05 MED ORDER — PHENAZOPYRIDINE HCL 100 MG PO TABS
100.0000 mg | ORAL_TABLET | Freq: Three times a day (TID) | ORAL | Status: DC
  Administered 2023-03-05 – 2023-03-07 (×5): 100 mg via ORAL
  Filled 2023-03-05 (×7): qty 1

## 2023-03-05 MED ORDER — LIDOCAINE HCL (PF) 1 % IJ SOLN
5.0000 mL | Freq: Once | INTRAMUSCULAR | Status: AC
  Administered 2023-03-05: 10 mL via INTRADERMAL
  Filled 2023-03-05: qty 5

## 2023-03-05 NOTE — Progress Notes (Addendum)
PROGRESS NOTE  Kerilee Rzepecki  ZOX:[id number] DOB: 1960/10/28 DOA: 03/03/2023 PCP: Bailey Mech, PA-C   Brief Narrative: Patient is a 62 y.o. female with medical history significant of chronic ambulatory problem, CVA, peripheral artery disease, diabetes type 1, hypertension, bilateral carotid stenosis status post endarterectomy,tobacco use, reactive airway disease who presented to the emergency department at [facility] with complaint of right hip pain .she fell about a week ago and fractures her left patella .she follows with Dr. Shawnie Dapper at [facility] orthopedics.  She was put in a knee brace and was recommended for outpatient follow-up.  She was walking with her left knee brace yesterday and attempted to step up a curb  when she developed severe  right sided hip pain and became unable to walk .  CT of right hip did not show any acute fracture or dislocation.  MRI of the right hip right proximal vastus lateralis muscle strain, labral tear, arthritis.  Orthopedics consulted.  Orthopedics consulting radiology for possible steroid  injection.  Pending PT/OT evaluation  Assessment & Plan:  Principal Problem:   Right hip pain Active Problems:   Recurrent UTI   Uncontrolled type 2 diabetes mellitus with hyperglycemia, with long-term current use of insulin (HCC)   Essential hypertension   Dyslipidemia   Carotid artery stenosis   Pain in right hip  Recurrent falls/right hip pain/left patellar fracture: Report of fall about a week ago resulting in fractured patella of the left.  Follows with Dr. Shawnie Dapper at [facility] orthopedics.  Has a knee brace on the left.  Developed severe pain on the right hip while attempting to step up a curb yesterday while wearing the brace. X-ray of the hip did not show any fracture or dislocation.  CT right hip without contrast did not show any acute fracture or dislocation .continue pain management, supportive care.  MRI of the right hip right proximal vastus  lateralis muscle strain, labral tear, arthritis.  Orthopedics consulted.  Orthopedics consulting radiology for possible steroid  injection.  PT OT consulted   Concern for UTI: UA was suspicious for UTI.  Urine culture ordered.  Patient does not have any leukocytosis or dysuria.  Will hold antibiotics for now.   Diabetes type 2: Takes insulin at home.  Currently on sliding scale.  A1c on 11/28/2021 was 7.8   Hyperlipidemia: Takes Lipitor.   History of CVA/carotid artery stenosis: Status post left CEA.  Takes Plavix, Lipitor.  Follows with vascular surgery   Hypothyroidism: Continue levothyroxine   CKD stage IIIa: Currently kidney function at baseline. Creatinine around 1.3.   Hypertension: Continue losartan.  Monitor blood pressure.  Currently stable   History of anxiety/depression: On Zoloft, bupropion         DVT prophylaxis:enoxaparin (LOVENOX) injection 40 mg Start: 03/04/23 1115     Code Status: Full Code  Family Communication: None at bedside  Patient status:obs  Patient is from : Home  Anticipated discharge to: Home  Estimated DC date: 1 to 2 days   Consultants: Orthopedics  Procedures: None  Antimicrobials:  Anti-infectives (From admission, onward)    Start     Dose/Rate Route Frequency Ordered Stop   03/04/23 0130  cefTRIAXone (ROCEPHIN) 1 g in sodium chloride 0.9 % 100 mL IVPB        1 g 200 mL/hr over 30 Minutes Intravenous  Once 03/04/23 0129 03/04/23 0234       Subjective: Patient seen and examined at bedside today.  She looks more  comfortable than yesterday.  Pain has improved but still has significant discomfort when she tries to lift her right lower extremity.  Objective: Vitals:   03/04/23 1709 03/04/23 2022 03/05/23 0507 03/05/23 0839  BP: (!) 128/48 (!) 94/55 (!) 143/52 (!) 142/54  Pulse: 68 65 67 (!) 58  Resp: 18 19 17 18   Temp: 98.9 F (37.2 C) 98.2 F (36.8 C) 98.6 F (37 C) 98.6 F (37 C)  TempSrc: Oral Oral  Oral  SpO2: 97%  94% 94% 96%  Weight:      Height:        Intake/Output Summary (Last 24 hours) at 03/05/2023 1058 Last data filed at 03/04/2023 2200 Gross per 24 hour  Intake 100 ml  Output --  Net 100 ml   Filed Weights   03/03/23 2000  Weight: 69.3 kg    Examination:  General exam: Overall comfortable, not in distress HEENT: PERRL Respiratory system:  no wheezes or crackles  Cardiovascular system: S1 & S2 heard, RRR.  Gastrointestinal system: Abdomen is nondistended, soft and nontender. Central nervous system: Alert and oriented Extremities: No edema, no clubbing ,no cyanosis, tenderness on the right hip Skin: No rashes, no ulcers,no icterus     Data Reviewed: I have personally reviewed following labs and imaging studies  CBC: Recent Labs  Lab 03/04/23 0024  WBC 10.2  NEUTROABS 8.1*  HGB 11.9*  HCT 36.3  MCV 89.2  PLT 237   Basic Metabolic Panel: Recent Labs  Lab 03/04/23 0014 03/04/23 1028 03/05/23 0148  NA 129* 137 134*  K 4.2 4.1 4.3  CL 98 101 105  CO2 23 23 21*  GLUCOSE 400* 81 135*  BUN 18 17 20   CREATININE 1.25* 1.30* 1.36*  CALCIUM 8.2* 8.7* 7.9*     Recent Results (from the past 240 hour(s))  Urine Culture (for pregnant, neutropenic or urologic patients or patients with an indwelling urinary catheter)     Status: Abnormal   Collection Time: 03/04/23 10:20 AM   Specimen: Urine, Clean Catch  Result Value Ref Range Status   Specimen Description URINE, CLEAN CATCH  Final   Special Requests NONE  Final   Culture (A)  Final    <10,000 COLONIES/mL INSIGNIFICANT GROWTH Performed at [facility], [address] [place], [place] [address]    Report Status 03/05/2023 FINAL  Final     Radiology Studies: MR HIP RIGHT W WO CONTRAST  Result Date: 03/05/2023 CLINICAL DATA:  Severe right hip pain without acute injury. EXAM: MRI OF THE RIGHT HIP WITHOUT AND WITH CONTRAST TECHNIQUE: Multiplanar, multisequence MR imaging was performed both before and after  administration of intravenous contrast. CONTRAST:  7mL GADAVIST GADOBUTROL 1 MMOL/ML IV SOLN COMPARISON:  CT right hip and right hip x-rays from yesterday. FINDINGS: Bones: There is no evidence of acute fracture, dislocation or avascular necrosis. No focal bone lesion. The visualized sacroiliac joints and symphysis pubis appear normal. Articular cartilage and labrum Articular cartilage: Mild partial-thickness cartilage loss in both hip joints. Labrum: Right anterior superior labral tear (series 4, images 30-31). No paralabral abnormality. Joint or bursal effusion Joint effusion: Small symmetric bilateral hip joint effusions. Bursae: No focal periarticular fluid collection. Muscles and tendons Muscles and tendons: Edema in the right proximal vastus lateralis muscle. Symmetric mild edema in the bilateral proximal adductor muscles. Symmetric edema surrounding the bilateral rectus femoris muscle indirect heads. No significant muscle atrophy. The visualized gluteus, hamstring and iliopsoas tendons are intact. Other findings Miscellaneous: Trace free fluid  in the pelvis, nonspecific. Small amount of dependent air in the bladder, presumably due to recent instrumentation IMPRESSION: 1. No acute osseous abnormality. 2. Right proximal vastus lateralis muscle strain. 3. Right anterior superior labral tear. 4. Mild bilateral hip osteoarthritis. Electronically Signed   By: Obie Dredge M.D.   On: 03/05/2023 09:21   CT Hip Right Wo Contrast  Result Date: 03/03/2023 CLINICAL DATA:  Hip pain, stress fracture suspected. EXAM: CT OF THE RIGHT HIP WITHOUT CONTRAST TECHNIQUE: Multidetector CT imaging of the right hip was performed according to the standard protocol. Multiplanar CT image reconstructions were also generated. RADIATION DOSE REDUCTION: This exam was performed according to the departmental dose-optimization program which includes automated exposure control, adjustment of the mA and/or kV according to patient size  and/or use of iterative reconstruction technique. COMPARISON:  Radiograph 03/03/2023 and CT abdomen and pelvis 01/26/2022 FINDINGS: Bones/Joint/Cartilage No acute fracture or dislocation. Mild right hip degenerative arthritis. Degenerative changes at the pubic symphysis. Ligaments Suboptimally assessed by CT. Muscles and Tendons No acute abnormality. Soft tissues Soft tissue thickening/edema and coarse calcifications within the posteromedial thigh/gluteal soft tissues. Gas within the bladder. IMPRESSION: 1. No acute fracture or dislocation. 2. Gas within the bladder. Correlate for recent instrumentation. Electronically Signed   By: Minerva Fester M.D.   On: 03/03/2023 23:19   DG Hip Unilat W or Wo Pelvis 2-3 Views Right  Result Date: 03/03/2023 CLINICAL DATA:  Right hip pain EXAM: DG HIP (WITH OR WITHOUT PELVIS) 2-3V RIGHT COMPARISON:  None Available. FINDINGS: Hip joints and SI joints symmetric. No acute bony abnormality. Specifically, no fracture, subluxation, or dislocation. IMPRESSION: No acute bony abnormality. Electronically Signed   By: Charlett Nose M.D.   On: 03/03/2023 21:34    Scheduled Meds:  atorvastatin  40 mg Oral q morning   buPROPion  150 mg Oral q morning   clopidogrel  75 mg Oral Daily   enoxaparin (LOVENOX) injection  40 mg Subcutaneous Q24H   folic acid  1 mg Oral q morning   insulin aspart  0-9 Units Subcutaneous TID WC   levothyroxine  50 mcg Oral QAC breakfast   losartan  50 mg Oral q morning   polyethylene glycol  17 g Oral Daily   sertraline  100 mg Oral q morning   Continuous Infusions:   LOS: 1 day   Burnadette Pop, MD [facility] P8/22/2024, 10:58 AM

## 2023-03-05 NOTE — Plan of Care (Signed)
Problem: Education: Goal: Knowledge of General Education information will improve Description: Including pain rating scale, medication(s)/side effects and non-pharmacologic comfort measures Outcome: Progressing Pt understands she was admitted into the hospital for right hip pain and left knee pain s/p fall.    Problem: Clinical Measurements: Goal: Ability to maintain clinical measurements within normal limits will improve Outcome: Progressing VS WNL.     Problem: Clinical Measurements: Goal: Will remain free from infection Outcome: Progressing S/Sx of infection monitored and assessed q-shift.  Pt has remained afebrile thus far.     Problem: Clinical Measurements: Goal: Respiratory complications will improve Outcome: Progressing Respiratory status monitored and assessed q-shift.  Pt is on room air with PO2 saturations at 90-96% and respiration rate of 18 breaths per minute.  She has not endorsed c/o SOE or DOE.     Problem: Clinical Measurements: Goal: Cardiovascular complication will be avoided Outcome: Progressing Pt's HR and BP have been WNL.  She has not endorsed c/o SOE/ DOE or CP/ palpation.       Problem: Activity: Goal: Risk for activity intolerance will decrease Outcome: Progressing Pt has been OOB to the bathroom with the assist of RN staff several times utilizing a FWW.  She was observed walking with a steady gait.  PT came and evaluated pt.  She was OOB up to the chair for 2 hours today.   Problem: Nutrition: Goal: Adequate nutrition will be maintained Outcome: Progressing Pt is on heart healthy diet per MD's orders.  She has been able to eat 50-90% of her meals thus far.  Pt has not endorse c/o n/v and abdominal pain/ distention.    Problem: Elimination: Goal: Will not experience complications related to bowel motility Outcome: Progressing Pt has endorsed c/o constipation. MD started pt on a bowel regiment.   Problem: Elimination: Goal: Will not experience  complications related to urinary retention Outcome: Progressing Pt has not endorsed dysuria, abdominal pain/ distention thus far.    Problem: Pain Managment: Goal: General experience of comfort will improve Outcome: Progressing Pt has endorsed c/o 4-10/10 right hip/ leg pain describing it as a constant throbbing ache.  Reiterated pain scale so she could adequately rate her pain.  Pt stated her pain goal this admission would be 0/10.  Discussed nonpharmacological methods to help reduce s/sx of pain.  Interventions given per pt's request and MD's orders.    Problem: Safety: Goal: Ability to remain free from injury will improve Outcome: Progressing Pt has remained free from falls thus far. Instructed pt to utilize RN call light for assistance. Hourly rounds performed. Bed alarm implemented to keep pt safe from falls. Settings activated to third most sensitive mode. Bed in lowest position, locked with two upper side rails engaged. Belongings and call light within reach.    Problem: Skin Integrity: Goal: Risk for impaired skin integrity will decrease Outcome: Progressing Skin integrity monitored and assessed q-shift. Instructed pt to turn q2 hours to prevent further skin impairment.  Tubes and drains assessed for device related pressure sores.  Pt is continent of both bowel and bladder.

## 2023-03-05 NOTE — Evaluation (Signed)
Physical Therapy Evaluation Patient Details Name: Michelle Nicholson MRN: [id number] DOB: 01-16-1961 Today's Date: 03/05/2023  History of Present Illness  Michelle Nicholson is a 62 yo female who presented after a fall with R hip pain. X-rays and CT scan of the hip are unremarkable,  MRI of the right hip showed right proximal vastus lateralis muscle strain, labral tear, arthritis. Urinalysis is consistent with UTI. Of note pt had a fall 8/10 with L patellar fx. PMHx: none on file.  Clinical Impression  Pt admitted with above diagnosis and presents to PT with functional limitations due to deficits listed below (See PT problem list). Pt needs skilled PT to maximize independence and safety. Pt limited by her rt hip pain. If pain improves expect mobility will rapidly improve. Patient will benefit from continued inpatient follow up therapy, <3 hours/day           If plan is discharge home, recommend the following: A little help with walking and/or transfers;A lot of help with bathing/dressing/bathroom;Assistance with cooking/housework;Assist for transportation;Help with stairs or ramp for entrance   Can travel by private vehicle        Equipment Recommendations Rolling walker (2 wheels);BSC/3in1  Recommendations for Other Services       Functional Status Assessment Patient has had a recent decline in their functional status and demonstrates the ability to make significant improvements in function in a reasonable and predictable amount of time.     Precautions / Restrictions Precautions Precautions: Fall Precaution Comments: fall 10 days ago with L patellar fx, KI, WBAT. Required Braces or Orthoses: Knee Immobilizer - Left Knee Immobilizer - Left: On at all times Restrictions Weight Bearing Restrictions: Yes LLE Weight Bearing: Weight bearing as tolerated      Mobility  Bed Mobility Overal bed mobility: Needs Assistance Bed Mobility: Supine to Sit, Sit to Supine     Supine to sit: Min  assist Sit to supine: Min assist   General bed mobility comments: Assist to manage LE's    Transfers Overall transfer level: Needs assistance Equipment used: Rolling walker (2 wheels) Transfers: Sit to/from Stand Sit to Stand: Mod assist           General transfer comment: Assist to power up due to pain in rt hip and LLE extended in front due to knee immobilizer. Assist to control descent to sitting due to pain in rt hipo    Ambulation/Gait Ambulation/Gait assistance: Contact guard assist Gait Distance (Feet): 12 Feet (x 2) Assistive device: Rolling walker (2 wheels) Gait Pattern/deviations: Step-to pattern, Decreased step length - right, Decreased step length - left, Decreased stance time - right, Antalgic Gait velocity: decr Gait velocity interpretation: <1.31 ft/sec, indicative of household ambulator   General Gait Details: Assist for safety  Stairs            Wheelchair Mobility     Tilt Bed    Modified Rankin (Stroke Patients Only)       Balance Overall balance assessment: Needs assistance Sitting-balance support: No upper extremity supported, Feet supported Sitting balance-Leahy Scale: Good     Standing balance support: No upper extremity supported, During functional activity Standing balance-Leahy Scale: Fair                               Pertinent Vitals/Pain Pain Assessment Pain Assessment: Faces Faces Pain Scale: Hurts whole lot Pain Location: R hip Pain Descriptors / Indicators: Grimacing, Guarding Pain Intervention(s): Limited activity within  patient's tolerance, Monitored during session, Repositioned    Home Living Family/patient expects to be discharged to:: Private residence Living Arrangements: Alone Available Help at Discharge: Family;Friend(s);Available PRN/intermittently Type of Home: House Home Access: Stairs to enter Entrance Stairs-Rails: Right;Left Entrance Stairs-Number of Steps: 3   Home Layout: Two  level;Able to live on main level with bedroom/bathroom Home Equipment: Cane - single point      Prior Function Prior Level of Function : Independent/Modified Independent;Driving             Mobility Comments: SPC since fall 10 days ago, per pt report she is to maintain L KI and is WBAT ADLs Comments: per report, mod I     Extremity/Trunk Assessment   Upper Extremity Assessment Upper Extremity Assessment: Overall WFL for tasks assessed    Lower Extremity Assessment Lower Extremity Assessment: RLE deficits/detail;LLE deficits/detail RLE Deficits / Details: Limited by hip pain. LLE Deficits / Details: knee immobilizer on for patellar fx    Cervical / Trunk Assessment Cervical / Trunk Assessment: Normal  Communication   Communication Communication: No apparent difficulties  Cognition Arousal: Alert Behavior During Therapy: WFL for tasks assessed/performed Overall Cognitive Status: Within Functional Limits for tasks assessed                                          General Comments      Exercises     Assessment/Plan    PT Assessment Patient needs continued PT services  PT Problem List Decreased range of motion;Decreased strength;Decreased balance;Decreased mobility;Pain       PT Treatment Interventions DME instruction;Gait training;Stair training;Functional mobility training;Therapeutic activities;Therapeutic exercise;Patient/family education    PT Goals (Current goals can be found in the Care Plan section)  Acute Rehab PT Goals Patient Stated Goal: decr pain to return home PT Goal Formulation: With patient Time For Goal Achievement: 03/19/23 Potential to Achieve Goals: Good    Frequency Min 1X/week     Co-evaluation               AM-PAC PT "6 Clicks" Mobility  Outcome Measure Help needed turning from your back to your side while in a flat bed without using bedrails?: A Little Help needed moving from lying on your back to sitting  on the side of a flat bed without using bedrails?: A Little Help needed moving to and from a bed to a chair (including a wheelchair)?: A Little Help needed standing up from a chair using your arms (e.g., wheelchair or bedside chair)?: A Lot Help needed to walk in hospital room?: A Little Help needed climbing 3-5 steps with a railing? : Total 6 Click Score: 15    End of Session   Activity Tolerance: Patient tolerated treatment well Patient left: in bed;with call bell/phone within reach;with bed alarm set;with family/visitor present Nurse Communication: Mobility status PT Visit Diagnosis: Other abnormalities of gait and mobility (R26.89);Pain Pain - Right/Left: Right Pain - part of body: Hip    Time: 1159-1228 PT Time Calculation (min) (ACUTE ONLY): 29 min   Charges:   PT Evaluation $PT Eval Low Complexity: 1 Low PT Treatments $Gait Training: 8-22 mins PT General Charges $$ ACUTE PT VISIT: 1 Visit         [facility] PT Acute Rehabilitation Services Office [phone]   Angelina Ok [facility] 03/05/2023, 2:14 PM

## 2023-03-05 NOTE — Consult Note (Signed)
Orthopaedic Trauma Service (OTS) Consult   Patient ID: Michelle Nicholson MRN: [id number] DOB/AGE: [birth year] 62 y.o.   Reason for Consult: Right hip pain  Referring Physician:  Burnadette Pop, MD (hospitalist)   HPI: Michelle Nicholson is an 62 y.o. female who presented to [facility] with severe right hip pain and inability to bear weight.  Patient states that she had just come from her doctor's office and had been in a car for a prolonged period of time and when she was getting get out of the car she had extreme pain in her right hip and inability to bear weight on it.  Of note she sustained a left patella fracture on 02/21/2023 and is being treated nonoperatively in a knee immobilizer for this.  She is following up with orthopedics at [facility]  Patient describes the pain as severe in nature located in her right groin and radiating to her back.  There are some radiation down the anterior aspect of her right thigh.  Denies any radiation down her posterior lower leg and foot.  Denies any acute trauma to her right hip.  She recently was on vacation to [place] and did a lot of walking.  She actually fell during this vacation which resulted in the previously mentioned patella fracture  She does not use any assistive devices at baseline for ambulation but has severely limited mobility.  She did have an MRI of her lumbar spine November 2023 which did show significant pathology noted to have L3-L4 right paracentral and subarticular disc protrusion as well as narrowing of the right lateral recess.  Also bilateral neuroforaminal stenosis at L5-S1 also with disc protrusion.  Patient did see [facility] interventional pain management who performed a steroid injection of her lumbar spine earlier of 2024.  She did have eventual resolution of her breast symptoms but her symptoms with activity persisted.  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  Denies any recent illnesses  Past Medical History:  Diagnosis Date   Anemia    Arthritis    Cancer (HCC)    skin ca, squamous, basal   COPD (chronic obstructive pulmonary disease) (HCC)    Diabetes mellitus    Dyspnea    Family history of adverse reaction to anesthesia    `mother - N/V, Brother N/V   Hypertension    Hypothyroidism    Neuropathy    Pancreas disorder    Peripheral vascular disease (HCC)    Pneumonia    04/2021, 2003   Stroke [facility]) 2017   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, equilibrium    Past Surgical History:  Procedure Laterality Date   CARDIAC CATHETERIZATION     ENDARTERECTOMY Left 12/02/2021   Procedure: LEFT CAROTID ENDARTERECTOMY;  Surgeon: Victorino Sparrow, MD;  Location: [facility] OR;  Service: Vascular;  Laterality: Left;   PATCH ANGIOPLASTY Left 12/02/2021   Procedure: PATCH ANGIOPLASTY USING Livia Snellen BIOLOGIC 1cm x 6cm PATCH;  Surgeon: Victorino Sparrow, MD;  Location: [facility] OR;  Service: Vascular;  Laterality: Left;    Family History  Problem Relation Age of Onset   Cancer Mother    Hypertension Father    Diabetes Father    Cancer Sister    Diabetes Sister    Diabetes Brother    Kidney failure Brother    CAD Brother    Diabetes Other     Social History:  reports that she quit smoking about 15 months ago. Her smoking use included cigarettes. She has never used smokeless tobacco. She reports that she does  not drink alcohol and does not use drugs.  Allergies:  Allergies  Allergen Reactions   Dorzolamide Hcl-Timolol Mal Itching and Other (See Comments)    Makes her eyes burn  Conjunctival Injection with Burning  Visual Acuity Blurry  Other Reaction(s): Other  Makes her eyes burn  Conjunctival Injection with Burning  Visual Acuity Blurry  Makes her eyes burn  VA blurry  Conjunctival Injection with Burning  Visual Acuity with Blurred Vision  Conjunctival Injection with Burning    Visual Acuity with Blurred Vision    Makes her eyes burn Conjunctival Injection with Burning Visual Acuity Blurry  Conjunctival Injection with Burning, , Visual Acuity with Blurred Vision, , Makes her eyes burn Conjunctival Injection with Burning Visual Acuity Blurry   Brimonidine Rash and Other (See Comments)    Follicular conjunctivitis   Netarsudil-Latanoprost Other (See Comments)    Redness, tearing, burning  Other Reaction(s): Other (See Comments)  Redness, tearing, burning  Redness, tearing, burning  Redness, tearing, burning Redness, tearing, burning    Medications: I have reviewed the patient's current medications. Scheduled Meds:  atorvastatin  40 mg Oral q morning   bupivacaine (PF)  5 mL Intra-articular Once   buPROPion  150 mg Oral q morning   clopidogrel  75 mg Oral Daily   enoxaparin (LOVENOX) injection  40 mg Subcutaneous Q24H   folic acid  1 mg Oral q morning   insulin aspart  0-9 Units Subcutaneous TID WC   levothyroxine  50 mcg Oral QAC breakfast   lidocaine (PF)  5 mL Intradermal Once   losartan  50 mg Oral q morning   methylPREDNISolone acetate  80 mg Intra-articular Once   phenazopyridine  100 mg Oral TID WC   polyethylene glycol  17 g Oral Daily   sertraline  100 mg Oral q morning   Continuous Infusions: PRN Meds:.acetaminophen, HYDROcodone-acetaminophen, HYDROmorphone (DILAUDID) injection, iohexol, methocarbamol   Results for orders placed or performed during  the hospital encounter of 03/03/23 (from the past 48 hour(s))  CBG monitoring, ED     Status: Abnormal   Collection Time: 03/03/23 11:00 PM  Result Value Ref Range   Glucose-Capillary 397 (H) 70 - 99 mg/dL    Comment: Glucose reference range applies only to samples taken after fasting for at least 8 hours.  Basic metabolic panel     Status: Abnormal   Collection Time: 03/04/23 12:14 AM  Result Value Ref Range   Sodium 129 (L) 135 - 145 mmol/L   Potassium 4.2 3.5 - 5.1 mmol/L   Chloride 98 98 - 111 mmol/L   CO2 23 22 - 32 mmol/L   Glucose, Bld 400 (H) 70 - 99 mg/dL    Comment: Glucose reference range  applies only to samples taken after fasting for at least 8 hours.   BUN 18 8 - 23 mg/dL   Creatinine, Ser 6.06 (H) 0.44 - 1.00 mg/dL   Calcium 8.2 (L) 8.9 - 10.3 mg/dL   GFR, Estimated 48 (L) >60 mL/min    Comment: (NOTE) Calculated using the CKD-EPI Creatinine Equation (2021)    Anion gap 8 5 - 15    Comment: Performed at [facility], [address] [place], [place] [address]  CBC with Differential/Platelet     Status: Abnormal   Collection Time: 03/04/23 12:24 AM  Result Value Ref Range   WBC 10.2 4.0 - 10.5 K/uL   RBC 4.07 3.87 - 5.11 MIL/uL   Hemoglobin 11.9 (L) 12.0 - 15.0 g/dL   HCT 10.9 32.3 - 55.7 %   MCV 89.2 80.0 - 100.0 fL   MCH 29.2 26.0 - 34.0 pg   MCHC 32.8 30.0 - 36.0 g/dL   RDW 32.2 02.5 - 42.7 %   Platelets 237 150 - 400 K/uL   nRBC 0.0 0.0 - 0.2 %   Neutrophils Relative % 79 %   Neutro Abs 8.1 (H) 1.7 - 7.7 K/uL   Lymphocytes Relative 11 %   Lymphs Abs 1.1 0.7 - 4.0 K/uL   Monocytes Relative 8 %   Monocytes Absolute 0.8 0.1 - 1.0 K/uL   Eosinophils Relative 1 %   Eosinophils Absolute 0.1 0.0 - 0.5 K/uL   Basophils Relative 0 %   Basophils Absolute 0.0 0.0 - 0.1 K/uL   Immature Granulocytes 1 %   Abs Immature Granulocytes 0.07 0.00 - 0.07 K/uL    Comment: Performed at [facility], [address] [place], [place] [address]  CBG  monitoring, ED     Status: Abnormal   Collection Time: 03/04/23  7:53 AM  Result Value Ref Range   Glucose-Capillary 108 (H) 70 - 99 mg/dL    Comment: Glucose reference range applies only to samples taken after fasting for at least 8 hours.  Urine Culture (for pregnant, neutropenic or urologic patients or patients with an indwelling urinary catheter)     Status: Abnormal   Collection Time: 03/04/23 10:20 AM   Specimen: Urine, Clean Catch  Result Value Ref Range   Specimen Description URINE, CLEAN CATCH    Special Requests NONE    Culture (A)     <10,000 COLONIES/mL INSIGNIFICANT GROWTH Performed at [facility], [address], [place], [place] [address]    Report Status 03/05/2023 FINAL   Basic metabolic panel     Status: Abnormal   Collection Time: 03/04/23 10:28 AM  Result Value Ref Range   Sodium 137 135 - 145 mmol/L    Comment: DELTA CHECK NOTED   Potassium 4.1 3.5 - 5.1 mmol/L   Chloride 101 98 - 111 mmol/L   CO2 23 22 - 32 mmol/L   Glucose, Bld 81 70 - 99 mg/dL    Comment: Glucose reference range applies only to samples taken after fasting for at least 8 hours.   BUN 17 8 - 23 mg/dL   Creatinine, Ser 5.17 (H) 0.44 - 1.00 mg/dL   Calcium 8.7 (L) 8.9 - 10.3 mg/dL   GFR, Estimated 46 (L) >60 mL/min    Comment: (NOTE) Calculated using the CKD-EPI Creatinine Equation (2021)    Anion gap 13 5 - 15    Comment: Performed at [facility], [address] [place], [place]  [address]  HIV Antibody (routine testing w rflx)     Status: None   Collection Time: 03/04/23 10:28 AM  Result Value Ref Range   HIV Screen 4th Generation wRfx Non Reactive Non Reactive    Comment: Performed at [facility], [address], [place], [place] [address]  Glucose, capillary     Status: None   Collection Time: 03/04/23 12:58 PM  Result Value Ref Range   Glucose-Capillary 80 70 - 99 mg/dL    Comment: Glucose reference range applies only to samples taken after fasting for at least  8 hours.  Glucose, capillary     Status: None   Collection Time: 03/04/23  5:11 PM  Result Value Ref Range   Glucose-Capillary 78 70 - 99 mg/dL    Comment: Glucose reference range applies only to samples taken after fasting for at least 8 hours.  Glucose, capillary     Status: Abnormal   Collection Time: 03/04/23  9:17 PM  Result Value Ref Range   Glucose-Capillary 141 (H) 70 - 99 mg/dL    Comment: Glucose reference range applies only to samples taken after fasting for at least 8 hours.  Basic metabolic panel     Status: Abnormal   Collection Time: 03/05/23  1:48 AM  Result Value Ref Range   Sodium 134 (L) 135 - 145 mmol/L   Potassium 4.3 3.5 - 5.1 mmol/L   Chloride 105 98 - 111 mmol/L   CO2 21 (L) 22 - 32 mmol/L   Glucose, Bld 135 (H) 70 - 99 mg/dL    Comment: Glucose reference range applies only to samples taken after fasting for at least 8 hours.   BUN 20 8 - 23 mg/dL   Creatinine, Ser 6.43 (H) 0.44 - 1.00 mg/dL   Calcium 7.9 (L) 8.9 - 10.3 mg/dL   GFR, Estimated 44 (L) >60 mL/min    Comment: (NOTE) Calculated using the CKD-EPI Creatinine Equation (2021)    Anion gap 8 5 - 15    Comment: Performed at [facility], [address] [place], [place] [address]  Glucose, capillary     Status: None   Collection Time: 03/05/23  8:33 AM  Result Value Ref Range   Glucose-Capillary 93 70 - 99 mg/dL    Comment: Glucose reference range applies only to samples taken after fasting for at least 8 hours.  Glucose, capillary     Status: Abnormal   Collection Time: 03/05/23 12:33 PM  Result Value Ref Range   Glucose-Capillary 177 (H) 70 - 99 mg/dL    Comment: Glucose reference range applies only to samples taken after fasting for at least 8 hours.    MR HIP RIGHT W WO CONTRAST  Result Date: 03/05/2023 CLINICAL DATA:  Severe right hip pain without acute injury. EXAM: MRI OF THE RIGHT HIP WITHOUT AND WITH CONTRAST TECHNIQUE: Multiplanar, multisequence MR imaging was performed both  before and after administration of intravenous contrast. CONTRAST:  7mL GADAVIST GADOBUTROL 1 MMOL/ML IV SOLN COMPARISON:  CT right hip and right hip x-rays from yesterday. FINDINGS: Bones: There is no evidence of acute fracture, dislocation or avascular necrosis. No focal bone lesion. The visualized sacroiliac joints and symphysis pubis appear normal. Articular cartilage and labrum Articular cartilage: Mild partial-thickness cartilage loss in both hip joints. Labrum: Right anterior superior labral tear (series 4, images 30-31). No paralabral abnormality. Joint or bursal effusion Joint effusion: Small symmetric bilateral hip joint effusions. Bursae: No focal periarticular fluid collection. Muscles  and tendons Muscles and tendons: Edema in the right proximal vastus lateralis muscle. Symmetric mild edema in the bilateral proximal adductor muscles. Symmetric edema surrounding the bilateral rectus femoris muscle indirect heads. No significant muscle atrophy. The visualized gluteus, hamstring and iliopsoas tendons are intact. Other findings Miscellaneous: Trace free fluid in the pelvis, nonspecific. Small amount of dependent air in the bladder, presumably due to recent instrumentation IMPRESSION: 1. No acute osseous abnormality. 2. Right proximal vastus lateralis muscle strain. 3. Right anterior superior labral tear. 4. Mild bilateral hip osteoarthritis. Electronically Signed   By: Obie Dredge M.D.   On: 03/05/2023 09:21   CT Hip Right Wo Contrast  Result Date: 03/03/2023 CLINICAL DATA:  Hip pain, stress fracture suspected. EXAM: CT OF THE RIGHT HIP WITHOUT CONTRAST TECHNIQUE: Multidetector CT imaging of the right hip was performed according to the standard protocol. Multiplanar CT image reconstructions were also generated. RADIATION DOSE REDUCTION: This exam was performed according to the departmental dose-optimization program which includes automated exposure control, adjustment of the mA and/or kV according to  patient size and/or use of iterative reconstruction technique. COMPARISON:  Radiograph 03/03/2023 and CT abdomen and pelvis 01/26/2022 FINDINGS: Bones/Joint/Cartilage No acute fracture or dislocation. Mild right hip degenerative arthritis. Degenerative changes at the pubic symphysis. Ligaments Suboptimally assessed by CT. Muscles and Tendons No acute abnormality. Soft tissues Soft tissue thickening/edema and coarse calcifications within the posteromedial thigh/gluteal soft tissues. Gas within the bladder. IMPRESSION: 1. No acute fracture or dislocation. 2. Gas within the bladder. Correlate for recent instrumentation. Electronically Signed   By: Minerva Fester M.D.   On: 03/03/2023 23:19   DG Hip Unilat W or Wo Pelvis 2-3 Views Right  Result Date: 03/03/2023 CLINICAL DATA:  Right hip pain EXAM: DG HIP (WITH OR WITHOUT PELVIS) 2-3V RIGHT COMPARISON:  None Available. FINDINGS: Hip joints and SI joints symmetric. No acute bony abnormality. Specifically, no fracture, subluxation, or dislocation. IMPRESSION: No acute bony abnormality. Electronically Signed   By: Charlett Nose M.D.   On: 03/03/2023 21:34    Intake/Output      08/21 0701 08/22 0700 08/22 0701 08/23 0700   P.O. 100    IV Piggyback     Total Intake(mL/kg) 100 (1.4)    Urine (mL/kg/hr) 200 (0.1)    Total Output 200    Net -100         Urine Occurrence 1 x 1 x      Review of Systems  Constitutional:  Negative for chills and fever.  Respiratory:  Negative for shortness of breath and wheezing.   Cardiovascular:  Negative for chest pain and palpitations.  Gastrointestinal:  Negative for nausea and vomiting.  Musculoskeletal:  Positive for back pain and joint pain (r hip and groin pain).   Blood pressure (!) 142/54, pulse (!) 58, temperature 98.6 F (37 C), temperature source Oral, resp. rate 18, height 5\' 9"  (1.753 m), weight 69.3 kg, SpO2 96%. Physical Exam Vitals and nursing note reviewed.  Constitutional:      General: She is  awake. She is not in acute distress.    Appearance: Normal appearance. She is well-developed and normal weight.     Comments: Pleasant white female, NAD   Cardiovascular:     Rate and Rhythm: Normal rate.     Heart sounds: S1 normal and S2 normal.  Pulmonary:     Effort: No accessory muscle usage or respiratory distress.  Musculoskeletal:     Comments: Pelvis     no traumatic  wounds or rash, no ecchymosis, stable to manual stress, nontender  Right Lower Extremity  No open or traumatic wounds right leg No significant swelling to right lower extremity Skin changes consistent with PVD appreciated bilaterally Symmetric DP pulses palpated bilaterally No pitting edema No knee or ankle effusions Patient unable to perform straight leg raise due to referred hip pain No significant pain with gentle axial loading of her hip Patient has reproducible pain with internal rotation of her hip Hip pain reproduced with Stinchfield test DPN, SPN, TN sensory functions grossly intact Ankle flexion, extension, inversion and eversion are intact  Patient does have appreciable weakness with manual muscle testing of her EHL when compared to the contralateral side. 4-4+/5 compared to the left side No heel cord tightness bilaterally No ankle flexion contracture bilaterally Patient is able to perform active knee extension and knee flexion Symptoms not reproduced with well straight leg raise   Neurological:     Mental Status: She is alert.  Psychiatric:        Behavior: Behavior is cooperative.    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  If no improvement we can consider  re-imaging her back.  However, I do wonder if there is potentially a vascular component as well as she did not get complete resolution of her symptoms when she had her back injection     Will likely obtain ABI's to start vascular workup as well. Pt does have a history of carotid endarterectomy so vascular component is plausible    - Pain management:  Multimodal   - Medical issues   Per primary   - DVT/PE prophylaxis:  Lovenox held   On plavix PTA  - Dispo:  Per medicine   Will follow up with pt post hip aspiration/injection     Mearl Latin, PA-C [phone] (C) 03/05/2023, 2:03 PM  [facility] [address] [place] [address] [phone] [phone] (F)    After 5pm and on the weekends please log on to Amion, go to orthopaedics and the look under the [facility] Call for the provider(s) on call. You can also call our office at [phone] and then follow the prompts to be connected to the call team.

## 2023-03-05 NOTE — Plan of Care (Signed)
  Problem: Fluid Volume: Goal: Ability to maintain a balanced intake and output will improve Outcome: Progressing   

## 2023-03-05 NOTE — Plan of Care (Signed)

## 2023-03-05 NOTE — Procedures (Signed)
Radiology Procedure Note  Risks and benefits of joint injection were discussed with the patient including, but not limited to bleeding, infection, damage to adjacent structures, allergic reaction, and extraarticular contrast injection.    All of the patient's questions were answered, patient is agreeable to proceed. Consent signed and in chart.  A timeout was performed with all members of the team prior to start of the procedure. Correct patient and correct procedure was confirmed. Allergies were reviewed.   PROCEDURE SUMMARY:  Fluoroscopy-guided right hip steroid injection was requested for this patient to alleviate right hip pain.  Under fluoroscopic guidance, needle was guided to superolateral edge of right femoral neck. Stylette was removed and thick, green fluid exited from the needle hub. A sterile syringe was used to collect ~5 mL of green fluid and sent for laboratory examination. Orthopedic team was contacted regarding concern for possible septic joint. After discussion with orthopedic team, 5 mL of bupivacaine only was injected into right hip joint. No steroid was administered.   Successful fluoro guided right hip aspiration and bupivacaine injection. No immediate complications.  Patient tolerated well.   EBL = trace  Please see full dictation in imaging section of Epic for procedure details.   Kennieth Francois PA-C 03/05/2023 2:42 PM

## 2023-03-05 NOTE — Progress Notes (Signed)
Pt has returned from MRI. 

## 2023-03-05 NOTE — TOC Initial Note (Signed)
Transition of Care [facility]) - Initial/Assessment Note    Patient Details  Name: Michelle Nicholson MRN: [id number] Date of Birth: 18-Feb-1961  Transition of Care [facility]) CM/SW Contact:    Monya Kozakiewicz A [place], Theresia Majors Phone Number: 03/05/2023, 3:51 PM  Clinical Narrative:                  CSW spoke with pt's significant other, Rocky Link, as pt had gone to complete a procedure. He updated CSW to state that pt would need a possible SNF due to her hip and knee. He said that if pt does ok with injection then she could possibly return home with [facility] PT/OT.   He said that if pt needed SNF then they had discussed preference for [facility] in [place] and referrals for that surrounding area.   He said that he and pt would update CSW on disposition plan as they were informed. CSW provided contact information to reach out to when needed.   TOC will continue to follow.   Expected Discharge Plan: Skilled Nursing Facility Barriers to Discharge: SNF Pending bed offer, Insurance Authorization, Continued Medical Work up   Patient Goals and CMS Choice            Expected Discharge Plan and Services In-house Referral: Clinical Social Work     Living arrangements for the past 2 months: Single Family Home                                      Prior Living Arrangements/Services Living arrangements for the past 2 months: Single Family Home Lives with:: Significant Other                   Activities of Daily Living Home Assistive Devices/Equipment: None ADL Screening (condition at time of admission) Patient's cognitive ability adequate to safely complete daily activities?: Yes Is the patient deaf or have difficulty hearing?: Yes Does the patient have difficulty seeing, even when wearing glasses/contacts?: No Does the patient have difficulty concentrating, remembering, or making decisions?: No Patient able to express need for assistance with ADLs?: Yes Does the patient have difficulty dressing or  bathing?: Yes Independently performs ADLs?: No Communication: Independent Dressing (OT): Needs assistance Is this a change from baseline?: Change from baseline, expected to last <3days Grooming: Independent Feeding: Independent Bathing: Needs assistance Is this a change from baseline?: Change from baseline, expected to last <3 days Toileting: Needs assistance Is this a change from baseline?: Change from baseline, expected to last <3 days In/Out Bed: Needs assistance Is this a change from baseline?: Change from baseline, expected to last <3 days Walks in Home: Independent Does the patient have difficulty walking or climbing stairs?: Yes Weakness of Legs: Both Weakness of Arms/Hands: None  Permission Sought/Granted                  Emotional Assessment   Attitude/Demeanor/Rapport: Unable to Assess Affect (typically observed): Unable to Assess Orientation: : Oriented to Self, Oriented to Place, Oriented to  Time, Oriented to Situation Alcohol / Substance Use: Tobacco Use Psych Involvement: No (comment)  Admission diagnosis:  Right hip pain [M25.551] Pain in right hip [M25.551] Patient Active Problem List   Diagnosis Date Noted   Right hip pain 03/04/2023   Pain in right hip 03/04/2023   Carotid artery stenosis 12/02/2021   Pyuria 11/29/2021   Preop cardiovascular exam    Acute ischemic stroke (HCC)  11/28/2021   Recurrent UTI 11/28/2021   Internal carotid artery stenosis, left 11/28/2021   Hypokalemia 11/28/2021   Dyslipidemia 11/28/2021   Tobacco abuse 11/28/2021   Fatigue 11/28/2021   Severe sepsis (HCC) 04/21/2021   Shortness of breath 04/21/2021   Acute respiratory failure with hypoxia (HCC) 04/21/2021   Chronic hyponatremia 04/21/2021   AKI (acute kidney injury) (HCC) 04/21/2021   Pneumonia 04/21/2021   Bilateral pneumonia 04/20/2021   Uncontrolled type 2 diabetes mellitus with hyperglycemia, with long-term current use of insulin (HCC) 12/09/2015   Essential  hypertension 12/09/2015   Meniere syndrome 12/09/2015   PCP:  Bailey Mech, PA-C Pharmacy:   [facility] [address] [place], [place] - [address] [place] [address] Phone: [phone] Fax: [phone]  [facility] [address] [place] [address] Phone: [phone] Fax: [phone]  [id number], [place] - [address] AT [facility] OF [facility] & [address] [facility] [address] [place] [address] Phone: [phone] Fax: [phone]     Social Determinants of Health (SDOH) Social History: SDOH Screenings   Food Insecurity: No Food Insecurity (03/04/2023)  Housing: Low Risk  (03/04/2023)  Transportation Needs: No Transportation Needs (03/04/2023)  Utilities: Not At Risk (03/04/2023)  Financial Resource Strain: Low Risk  (12/30/2022)   Received from [facility]  Physical Activity: Insufficiently Active (11/16/2019)   Received from [facility], [facility]  Social Connections: Unknown (11/22/2021)   Received from [facility]  Stress: No Stress Concern Present (04/09/2021)   Received from [facility]  Tobacco Use: Medium Risk (03/03/2023)   SDOH Interventions:     Readmission Risk Interventions    12/03/2021   11:36 AM  Readmission Risk Prevention Plan  Transportation Screening Complete  PCP or Specialist Appt within 3-5 Days Complete  Social Work Consult for Recovery Care Planning/Counseling Complete  Palliative Care Screening Not Applicable  Medication Review Oceanographer) Complete

## 2023-03-06 ENCOUNTER — Observation Stay (HOSPITAL_BASED_OUTPATIENT_CLINIC_OR_DEPARTMENT_OTHER): Payer: BC Managed Care – PPO

## 2023-03-06 ENCOUNTER — Observation Stay (HOSPITAL_COMMUNITY): Payer: BC Managed Care – PPO

## 2023-03-06 DIAGNOSIS — M25551 Pain in right hip: Secondary | ICD-10-CM

## 2023-03-06 DIAGNOSIS — I739 Peripheral vascular disease, unspecified: Secondary | ICD-10-CM | POA: Diagnosis not present

## 2023-03-06 LAB — GLUCOSE, CAPILLARY
Glucose-Capillary: 181 mg/dL — ABNORMAL HIGH (ref 70–99)
Glucose-Capillary: 213 mg/dL — ABNORMAL HIGH (ref 70–99)
Glucose-Capillary: 101 mg/dL — ABNORMAL HIGH (ref 70–99)
Glucose-Capillary: 248 mg/dL — ABNORMAL HIGH (ref 70–99)

## 2023-03-06 MED ORDER — PREDNISONE 10 MG PO TABS
20.0000 mg | ORAL_TABLET | Freq: Two times a day (BID) | ORAL | Status: DC
  Administered 2023-03-06 – 2023-03-07 (×2): 20 mg via ORAL
  Filled 2023-03-06 (×2): qty 2

## 2023-03-06 MED ORDER — LATANOPROST 0.005 % OP SOLN
1.0000 [drp] | Freq: Every day | OPHTHALMIC | Status: DC
  Administered 2023-03-06: 1 [drp] via OPHTHALMIC
  Filled 2023-03-06: qty 2.5

## 2023-03-06 NOTE — TOC Progression Note (Signed)
Transition of Care [facility]) - Progression Note    Patient Details  Name: Michelle Nicholson MRN: [id number] Date of Birth: 1961/04/28  Transition of Care [facility]) CM/SW Contact  Rossi Silvestro A [place], [place] Phone Number: 03/06/2023, 4:27 PM  Clinical Narrative:     CSW completed SNF workup as was notified of provider for disposition for SNF. CSW to follow up with pt and significant other Rocky Link with bed offers.   CSW also reached out to [facility], pt's primary choice, and left voice message with dennis, Admission Director at facility. CSW left voicemail with contact information to reach back out to CSW regarding referral for SNF.   TOC will continue to follow.   Expected Discharge Plan: Skilled Nursing Facility Barriers to Discharge: SNF Pending bed offer, Insurance Authorization, Continued Medical Work up  Expected Discharge Plan and Services In-house Referral: Clinical Social Work     Living arrangements for the past 2 months: Single Family Home                                       Social Determinants of Health (SDOH) Interventions SDOH Screenings   Food Insecurity: No Food Insecurity (03/04/2023)  Housing: Low Risk  (03/04/2023)  Transportation Needs: No Transportation Needs (03/04/2023)  Utilities: Not At Risk (03/04/2023)  Financial Resource Strain: Low Risk  (12/30/2022)   Received from [facility]  Physical Activity: Insufficiently Active (11/16/2019)   Received from [facility], [facility]  Social Connections: Unknown (11/22/2021)   Received from [facility]  Stress: No Stress Concern Present (04/09/2021)   Received from [facility]  Tobacco Use: Medium Risk (03/03/2023)    Readmission Risk Interventions    12/03/2021   11:36 AM  Readmission Risk Prevention Plan  Transportation Screening Complete  PCP or Specialist Appt within 3-5 Days Complete  Social Work Consult for Recovery Care  Planning/Counseling Complete  Palliative Care Screening Not Applicable  Medication Review Oceanographer) Complete

## 2023-03-06 NOTE — Progress Notes (Signed)
PT Cancellation Note  Patient Details Name: Jaretta Bol MRN: [id number] DOB: 02-13-1961   Cancelled Treatment:    Reason Eval/Treat Not Completed: (P) Other (comment) (Pt sleeping, did not awaken easily to voice. PTA plans to return later in day to reattempt OOB.) Will continue efforts per PT plan of care as schedule permits.   Sadey Yandell M Remmy Riffe 03/06/2023, 11:15 AM

## 2023-03-06 NOTE — Plan of Care (Signed)

## 2023-03-06 NOTE — Progress Notes (Signed)
Orthopaedic Trauma Service Progress Note  Patient ID: Michelle Nicholson MRN: [id number] DOB/AGE: Dec 08, 1960 62 y.o.  Subjective:  Overall pt is improving  Steroid injection attempted yesterday. Fluid looked a little atypical so the decision was made to only put bupivacaine in the R hip and not the steroid.  Fluid sent for cell count and cultures  Gram stain shows no organisms Synovial fluid analysis is currently more consistent with inflammatory arthropathy and not septic arthritis.   Offered to try to get pt back down to IR to get the steroid injection today but she declined   Pt scheduled for some vascular studies today to see if there is potentially a vascular component to her pain  See ortho consult note for full summary    ROS As above Objective:   VITALS:   Vitals:   03/05/23 1649 03/05/23 1961 03/06/23 0350 03/06/23 0747  BP: (!) 114/46 (!) 132/54 (!) 120/56 (!) 170/57  Pulse: (!) 55 61 61 61  Resp: 18 18  16   Temp: 98.3 F (36.8 C) 98.5 F (36.9 C) 98.2 F (36.8 C) 98.1 F (36.7 C)  TempSrc:  Oral Oral Oral  SpO2: 90% 97% 94% 95%  Weight:      Height:        Estimated body mass index is 22.57 kg/m as calculated from the following:   Height as of this encounter: 5\' 9"  (1.753 m).   Weight as of this encounter: 69.3 kg.   Intake/Output      08/22 0701 08/23 0700 08/23 0701 08/24 0700   P.O. 240 240   Total Intake(mL/kg) 240 (3.5) 240 (3.5)   Urine (mL/kg/hr)     Total Output     Net +240 +240        Urine Occurrence 7 x 1 x   Stool Occurrence 1 x      LABS  Results for orders placed or performed during the hospital encounter of 03/03/23 (from the past 24 hour(s))  Synovial cell count + diff, w/ crystals     Status: Abnormal   Collection Time: 03/05/23  2:49 PM  Result Value Ref Range   Color, Synovial YELLOW YELLOW   Appearance-Synovial TURBID (A) CLEAR   Crystals, Fluid NO  CRYSTALS SEEN    WBC, Synovial 27,750 (H) 0 - 200 /cu mm   Neutrophil, Synovial 94 (H) 0 - 25 %   Lymphocytes-Synovial Fld 0 0 - 20 %   Monocyte-Macrophage-Synovial Fluid 6 (L) 50 - 90 %   Eosinophils-Synovial 0 0 - 1 %  Anaerobic culture w Gram Stain     Status: None (Preliminary result)   Collection Time: 03/05/23  2:49 PM   Specimen: Joint, Right Hip; Synovial Fluid  Result Value Ref Range   Specimen Description SYNOVIAL    Special Requests RIGHT HIP    Gram Stain      FEW WBC PRESENT,BOTH PMN AND MONONUCLEAR NO ORGANISMS SEEN Performed at [facility], [address] [place], [place] [address]    Culture PENDING    Report Status PENDING   Body fluid culture w Gram Stain     Status: None (Preliminary result)   Collection Time: 03/05/23  2:49 PM   Specimen: Joint, Right Hip; Synovial Fluid  Result Value Ref Range   Specimen Description SYNOVIAL  Special Requests RIGHT HIP    Gram Stain      FEW WBC PRESENT, PREDOMINANTLY PMN NO ORGANISMS SEEN    Culture      NO GROWTH < 24 HOURS Performed at [facility], [address] [place], [place] [address]    Report Status PENDING   Glucose, capillary     Status: Abnormal   Collection Time: 03/05/23  4:50 PM  Result Value Ref Range   Glucose-Capillary 143 (H) 70 - 99 mg/dL  Glucose, capillary     Status: Abnormal   Collection Time: 03/05/23  9:02 PM  Result Value Ref Range   Glucose-Capillary 228 (H) 70 - 99 mg/dL  Glucose, capillary     Status: Abnormal   Collection Time: 03/06/23  7:49 AM  Result Value Ref Range   Glucose-Capillary 181 (H) 70 - 99 mg/dL  Glucose, capillary     Status: Abnormal   Collection Time: 03/06/23 11:29 AM  Result Value Ref Range   Glucose-Capillary 213 (H) 70 - 99 mg/dL     PHYSICAL EXAM:   Gen: resting comfortably, NAD Ext:       Right Lower Extremity   Improved motion though minimally improved  She can do a little bit of a SLR today   No pain with axial loading of hip    Exam otherwise unchanged   Assessment/Plan:     Principal Problem:   Right hip pain Active Problems:   Uncontrolled type 2 diabetes mellitus with hyperglycemia, with long-term current use of insulin (HCC)   Essential hypertension   Recurrent UTI   Dyslipidemia   Carotid artery stenosis   Pain in right hip   Anti-infectives (From admission, onward)    Start     Dose/Rate Route Frequency Ordered Stop   03/04/23 0130  cefTRIAXone (ROCEPHIN) 1 g in sodium chloride 0.9 % 100 mL IVPB        1 g 200 mL/hr over 30 Minutes Intravenous  Once 03/04/23 0129 03/04/23 0234     .  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  Will start steroid taper, have discussed with medicine team        Follow up on final cultures       ABIs and arterial duplex to be done later today     Recommend pt follow up with [facility] for her back (she can also follow up there for her hip as she will likely need a hip replacement in the future) and her vascular team if studies require     WBAT B LEx   Therapies   Heat and ice PRN    - Pain management:               Multimodal    - Medical issues                Per primary    -  Dispo:               Per medicine                   Mearl Latin, PA-C [phone] Salena Saner) 03/06/2023, 1:22 PM  [facility] [address] [place] [address] [phone] Val [phone] (F)    After 5pm and on the weekends please log on to Amion, go to orthopaedics and the look under the [facility] Call for the provider(s) on call. You can also call our office at [phone] and then follow the prompts to be connected to the call team.  Patient ID: Michelle Nicholson, female   DOB: Dec 08, 1960, 62 y.o.   MRN: [id number]

## 2023-03-06 NOTE — NC FL2 (Signed)
[place] MEDICAID FL2 LEVEL OF CARE FORM     IDENTIFICATION  Patient Name: Michelle Nicholson Birthdate: 03/17/1961 Sex: female Admission Date (Current Location): 03/03/2023  [facility] and [place] Number:  Producer, television/film/video and Address:  [facility], [address] [place], [place] [address]      Provider Number: [id number]  Attending Physician Name and Address:  Burnadette Pop, MD  Relative Name and Phone Number:  Jonetta Speak (Other)  [phone]    Current Level of Care: Hospital Recommended Level of Care: Skilled Nursing Facility Prior Approval Number:    Date Approved/Denied:   PASRR Number: [id number]  Discharge Plan: SNF    Current Diagnoses: Patient Active Problem List   Diagnosis Date Noted   Right hip pain 03/04/2023   Pain in right hip 03/04/2023   Carotid artery stenosis 12/02/2021   Pyuria 11/29/2021   Preop cardiovascular exam    Acute ischemic stroke (HCC) 11/28/2021   Recurrent UTI 11/28/2021   Internal carotid artery stenosis, left 11/28/2021   Hypokalemia 11/28/2021   Dyslipidemia 11/28/2021   Tobacco abuse 11/28/2021   Fatigue 11/28/2021   Severe sepsis (HCC) 04/21/2021   Shortness of breath 04/21/2021   Acute respiratory failure with hypoxia (HCC) 04/21/2021   Chronic hyponatremia 04/21/2021   AKI (acute kidney injury) (HCC) 04/21/2021   Pneumonia 04/21/2021   Bilateral pneumonia 04/20/2021   Uncontrolled type 2 diabetes mellitus with hyperglycemia, with long-term current use of insulin (HCC) 12/09/2015   Essential hypertension 12/09/2015   Meniere syndrome 12/09/2015    Orientation RESPIRATION BLADDER Height & Weight     Self, Time, Situation, Place  Normal External catheter, Incontinent Weight: 152 lb 13.9 oz (69.3 kg) Height:  5\' 9"  (175.3 cm)  BEHAVIORAL SYMPTOMS/MOOD NEUROLOGICAL BOWEL NUTRITION STATUS      Continent Diet (see discharge summary)  AMBULATORY STATUS COMMUNICATION OF NEEDS Skin   Limited Assist  Verbally Normal                       Personal Care Assistance Level of Assistance  Bathing, Feeding, Dressing Bathing Assistance: Maximum assistance Feeding assistance: Independent Dressing Assistance: Maximum assistance     Functional Limitations Info  Sight, Hearing, Speech Sight Info: Impaired Hearing Info: Impaired (Deaf in right ear) Speech Info: Adequate    SPECIAL CARE FACTORS FREQUENCY  PT (By licensed PT), OT (By licensed OT)     PT Frequency: 5x/week OT Frequency: 5x/week            Contractures Contractures Info: Not present    Additional Factors Info  Code Status, Allergies Code Status Info: FULL Allergies Info: Dorzolamide Hcl-timolol Mal  Brimonidine  Netarsudil-latanoprost           Current Medications (03/06/2023):  This is the current hospital active medication list Current Facility-Administered Medications  Medication Dose Route Frequency Provider Last Rate Last Admin   acetaminophen (TYLENOL) tablet 1,000 mg  1,000 mg Oral Daily PRN Burnadette Pop, MD       atorvastatin (LIPITOR) tablet 40 mg  40 mg Oral q morning Adhikari, Amrit, MD   40 mg at 03/06/23 0958   buPROPion (WELLBUTRIN XL) 24 hr tablet 150 mg  150 mg Oral q morning Burnadette Pop, MD   150 mg at 03/06/23 0958   clopidogrel (PLAVIX) tablet 75 mg  75 mg Oral Daily Burnadette Pop, MD   75 mg at 03/06/23 0958   enoxaparin (LOVENOX) injection 40 mg  40 mg Subcutaneous Q24H  Burnadette Pop, MD   40 mg at 03/06/23 1311   folic acid (FOLVITE) tablet 1 mg  1 mg Oral q morning Burnadette Pop, MD   1 mg at 03/06/23 0958   HYDROcodone-acetaminophen (NORCO/VICODIN) 5-325 MG per tablet 1 tablet  1 tablet Oral Q6H PRN Burnadette Pop, MD   1 tablet at 03/05/23 2226   HYDROmorphone (DILAUDID) injection 0.5-1 mg  0.5-1 mg Intravenous Q2H PRN Burnadette Pop, MD   1 mg at 03/05/23 1520   insulin aspart (novoLOG) injection 0-9 Units  0-9 Units Subcutaneous TID WC Adhikari, Amrit, MD   3 Units at  03/06/23 1311   iohexol (OMNIPAQUE) 180 MG/ML injection 10 mL  10 mL Intra-articular Once PRN Montez Morita, PA-C       levothyroxine (SYNTHROID) tablet 50 mcg  50 mcg Oral QAC breakfast Burnadette Pop, MD   50 mcg at 03/06/23 0556   losartan (COZAAR) tablet 50 mg  50 mg Oral q morning Burnadette Pop, MD   50 mg at 03/06/23 0958   methocarbamol (ROBAXIN) tablet 750 mg  750 mg Oral Q6H PRN Burnadette Pop, MD   750 mg at 03/05/23 1610   methylPREDNISolone acetate (DEPO-MEDROL) injection (RADIOLOGY ONLY) 80 mg  80 mg Intra-articular Once Montez Morita, PA-C       phenazopyridine (PYRIDIUM) tablet 100 mg  100 mg Oral TID WC Adhikari, Amrit, MD   100 mg at 03/06/23 1316   polyethylene glycol (MIRALAX / GLYCOLAX) packet 17 g  17 g Oral Daily Adhikari, Amrit, MD   17 g at 03/05/23 9604   predniSONE (DELTASONE) tablet 20 mg  20 mg Oral BID WC Montez Morita, PA-C       sertraline (ZOLOFT) tablet 100 mg  100 mg Oral q morning Burnadette Pop, MD   100 mg at 03/06/23 5409     Discharge Medications: Please see discharge summary for a list of discharge medications.  Relevant Imaging Results:  Relevant Lab Results:   Additional Information WJX:[id number]  [place] Current A [place], LCSWA

## 2023-03-06 NOTE — Progress Notes (Signed)
ABI and RLE arterial duplex have been completed.   Results can be found under chart review under CV PROC. 03/06/2023 3:55 PM Emi Lymon RVT, RDMS

## 2023-03-06 NOTE — Progress Notes (Signed)
Physical Therapy Treatment Patient Details Name: Michelle Nicholson MRN: [id number] DOB: [birth year] Today's Date: 03/06/2023   History of Present Illness Michelle Nicholson is a 62 yo female who presented after a fall with R hip pain. X-rays and CT scan of the hip are unremarkable,  MRI of the right hip showed right proximal vastus lateralis muscle strain, labral tear, arthritis. Urinalysis is consistent with UTI. Of note pt had a fall 8/10 with L patellar fx. PMHx: none on file.    PT Comments  Pt received in supine, agreeable to therapy session and with good participation and tolerance for transfer and gait training. Also reviewed BUE exercises for ROM and strengthening. Upon PTA entry to room, pt had KI doffed, therapist emphasized importance of keeping LLE KI donned per precautions to ensure proper healing. Pt needing mostly CGA for transfer and gait safety with RW support, continue to recommend RW and 3in1 DME for DC, disposition updated to HHPT after discussion with Supervising PT Michelle Nicholson, pt and significant other, who she will be staying with. Per sig other, he has a ramp for ease of entry to home. Pt continues to benefit from PT services to progress toward functional mobility goals.     If plan is discharge home, recommend the following: A little help with walking and/or transfers;Assistance with cooking/housework;Assist for transportation;Help with stairs or ramp for entrance;A little help with bathing/dressing/bathroom   Can travel by private vehicle     Yes  Equipment Recommendations  Rolling walker (2 wheels);BSC/3in1    Recommendations for Other Services       Precautions / Restrictions Precautions Precautions: Fall Precaution Comments: fall 10 days ago with L patellar fx, KI, WBAT. Required Braces or Orthoses: Knee Immobilizer - Left Knee Immobilizer - Left: On at all times Restrictions Weight Bearing Restrictions: Yes LLE Weight Bearing: Weight bearing as tolerated     Mobility  Bed  Mobility Overal bed mobility: Needs Assistance Bed Mobility: Supine to Sit     Supine to sit: Supervision     General bed mobility comments: pt able to self-assist using bed rails, HOB partially elevated    Transfers Overall transfer level: Needs assistance Equipment used: Rolling walker (2 wheels) Transfers: Sit to/from Stand Sit to Stand: Contact guard assist           General transfer comment: Cues for safer UE placement and foot placement; CGA for stand>sit and STS to/from [facility] seat over toilet and to sit in chair    Ambulation/Gait Ambulation/Gait assistance: Contact guard assist Gait Distance (Feet): 75 Feet Assistive device: Rolling walker (2 wheels) Gait Pattern/deviations: Step-to pattern, Decreased step length - right, Decreased step length - left, Decreased stance time - right, Antalgic Gait velocity: decr     General Gait Details: Assist for safety and cues for improved sequencing and energy conservation   Stairs             Wheelchair Mobility     Tilt Bed    Modified Rankin (Stroke Patients Only)       Balance Overall balance assessment: Needs assistance Sitting-balance support: No upper extremity supported, Feet supported Sitting balance-Leahy Scale: Good     Standing balance support: No upper extremity supported, During functional activity, Bilateral upper extremity supported Standing balance-Leahy Scale: Fair Standing balance comment: improved stability using RW, pain is much higher without AD  Cognition Arousal: Alert Behavior During Therapy: WFL for tasks assessed/performed Overall Cognitive Status: Within Functional Limits for tasks assessed                                 General Comments: Alert and oriented today, states she has rested better and without pain meds she feels clearer. Boyfriend visiting and pt is agreeable to stay at his home during recovery which has a ramp so  she doesn't need to perform stairs.        Exercises Other Exercises Other Exercises: reviewed BUE exercises to address R shoulder "stiff" feeling and discomfort from increased RW use, including BUE: shoulder shrugs/circles, scapular retraction, chin tucks x10 reps ea    General Comments General comments (skin integrity, edema, etc.): ice pack to sore R shoulder      Pertinent Vitals/Pain Pain Assessment Pain Assessment: 0-10 Pain Score: 4  Pain Location: R hip, L knee, R shoulder Pain Descriptors / Indicators: Grimacing, Guarding, Sore Pain Intervention(s): Limited activity within patient's tolerance, Monitored during session, Repositioned, Patient requesting pain meds-RN notified, Ice applied (ice to R shoulder)    Home Living                          Prior Function            PT Goals (current goals can now be found in the care plan section) Acute Rehab PT Goals Patient Stated Goal: decr pain to return home PT Goal Formulation: With patient Time For Goal Achievement: 03/19/23 Progress towards PT goals: Progressing toward goals    Frequency    Min 1X/week      PT Plan      Co-evaluation              AM-PAC PT "6 Clicks" Mobility   Outcome Measure  Help needed turning from your back to your side while in a flat bed without using bedrails?: None Help needed moving from lying on your back to sitting on the side of a flat bed without using bedrails?: A Little Help needed moving to and from a bed to a chair (including a wheelchair)?: A Little Help needed standing up from a chair using your arms (e.g., wheelchair or bedside chair)?: A Little Help needed to walk in hospital room?: A Little Help needed climbing 3-5 steps with a railing? : A Lot 6 Click Score: 18    End of Session Equipment Utilized During Treatment: Gait belt Activity Tolerance: Patient tolerated treatment well;Patient limited by pain Patient left: with call bell/phone within  reach;with family/visitor present;in chair (sig other present) Nurse Communication: Mobility status;Patient requests pain meds PT Visit Diagnosis: Other abnormalities of gait and mobility (R26.89);Pain Pain - Right/Left: Right Pain - part of body: Hip     Time: 1630-1705 PT Time Calculation (min) (ACUTE ONLY): 35 min  Charges:    $Gait Training: 8-22 mins $Therapeutic Activity: 8-22 mins PT General Charges $$ ACUTE PT VISIT: 1 Visit                     Dynasti Kerman P., PTA Acute Rehabilitation Services Secure Chat Preferred 9a-5:30pm Office: [phone]    Dorathy Kinsman [facility] 03/06/2023, 6:02 PM

## 2023-03-06 NOTE — Progress Notes (Signed)
PROGRESS NOTE  Michelle Nicholson  ZOX:[id number] DOB: July 25, 1960 DOA: 03/03/2023 PCP: Bailey Mech, PA-C   Brief Narrative: Patient is a 62 y.o. female with medical history significant of chronic ambulatory problem, CVA, peripheral artery disease, diabetes type 1, hypertension, bilateral carotid stenosis status post endarterectomy,tobacco use, reactive airway disease who presented to the emergency department at [facility] with complaint of right hip pain .she fell about a week ago and fractures her left patella .she follows with Dr. Shawnie Dapper at [facility] orthopedics.  She was put in a knee brace and was recommended for outpatient follow-up.  She was walking with her left knee brace yesterday and attempted to step up a curb  when she developed severe  right sided hip pain and became unable to walk .  CT of right hip did not show any acute fracture or dislocation.  MRI of the right hip right proximal vastus lateralis muscle strain, labral tear, arthritis.  Orthopedics consulted.  Orthopedics consulted radiology for possible steroid  injection.  Steroid injection not given due to suspicion for septic joint but fluid analysis is not suggestive.  Plan to continue oral steroids.  PT/OT recommending SNF on discharge.  Assessment & Plan:  Principal Problem:   Right hip pain Active Problems:   Recurrent UTI   Uncontrolled type 2 diabetes mellitus with hyperglycemia, with long-term current use of insulin (HCC)   Essential hypertension   Dyslipidemia   Carotid artery stenosis   Pain in right hip  Recurrent falls/right hip pain/left patellar fracture: Report of fall about a week ago resulting in fractured patella of the left.  Follows with Dr. Shawnie Dapper at [facility] orthopedics.  Has a knee brace on the left.  Developed severe pain on the right hip while attempting to step up a curb yesterday while wearing the brace. X-ray of the hip did not show any fracture or dislocation.  CT right hip without  contrast did not show any acute fracture or dislocation .continue pain management, supportive care.  MRI of the right hip right proximal vastus lateralis muscle strain, labral tear, arthritis.   Orthopedics consulted radiology for possible steroid  injection.  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  Plan to continue oral steroids.  PT/OT recommending SNF on discharge.   Concern for UTI: UA was suspicious for UTI.  Urine culture ordered.  Patient does not have any leukocytosis or dysuria.  Will hold antibiotics for now.   Diabetes type 2: Takes insulin at home.  Currently on sliding scale.  A1c on 11/28/2021 was 7.8   Hyperlipidemia: Takes Lipitor.   History of CVA/carotid artery stenosis: Status post left CEA.  Takes Plavix, Lipitor.  Follows with vascular surgery   Hypothyroidism: Continue levothyroxine   CKD stage IIIa: Currently kidney function at baseline. Creatinine around 1.3.   Hypertension: Continue losartan.  Monitor blood pressure.  Currently stable   History of anxiety/depression: On Zoloft, bupropion         DVT prophylaxis:enoxaparin (LOVENOX) injection 40 mg Start: 03/04/23 1115     Code Status: Full Code  Family Communication: Discussed with Sister Kriste Basque on phone at bedside on 8/23   Patient status:obs  Patient is from : Home  Anticipated discharge to: SNF  Estimated DC date: 1 to 2 days   Consultants: Orthopedics  Procedures: None  Antimicrobials:  Anti-infectives (From admission, onward)    Start  Dose/Rate Route Frequency Ordered Stop   03/04/23 0130  cefTRIAXone (ROCEPHIN) 1 g in sodium chloride 0.9 % 100 mL IVPB        1 g 200 mL/hr over 30 Minutes Intravenous  Once 03/04/23 0129 03/04/23 0234       Subjective: Patient seen and examined personally today.  Hemodynamically stable.  She looks more comfortable today.  She was  sitting on the chair.  Pain has been improved but not entirely gone.  Long discussion held with the patient and sister Kriste Basque on phone about our plan.  They are agreeable for skilled nursing facility  Objective: Vitals:   03/05/23 1649 03/05/23 1959 03/06/23 0350 03/06/23 0747  BP: (!) 114/46 (!) 132/54 (!) 120/56 (!) 170/57  Pulse: (!) 55 61 61 61  Resp: 18 18  16   Temp: 98.3 F (36.8 C) 98.5 F (36.9 C) 98.2 F (36.8 C) 98.1 F (36.7 C)  TempSrc:  Oral Oral Oral  SpO2: 90% 97% 94% 95%  Weight:      Height:        Intake/Output Summary (Last 24 hours) at 03/06/2023 1339 Last data filed at 03/06/2023 1045 Gross per 24 hour  Intake 240 ml  Output --  Net 240 ml   Filed Weights   03/03/23 2000  Weight: 69.3 kg    Examination:  General exam: Overall comfortable, not in distress HEENT: PERRL Respiratory system:  no wheezes or crackles  Cardiovascular system: S1 & S2 heard, RRR.  Gastrointestinal system: Abdomen is nondistended, soft and nontender. Central nervous system: Alert and oriented Extremities: No edema, no clubbing ,no cyanosis, tenderness in the right hip Skin: No rashes, no ulcers,no icterus     Data Reviewed: I have personally reviewed following labs and imaging studies  CBC: Recent Labs  Lab 03/04/23 0024  WBC 10.2  NEUTROABS 8.1*  HGB 11.9*  HCT 36.3  MCV 89.2  PLT 237   Basic Metabolic Panel: Recent Labs  Lab 03/04/23 0014 03/04/23 1028 03/05/23 0148  NA 129* 137 134*  K 4.2 4.1 4.3  CL 98 101 105  CO2 23 23 21*  GLUCOSE 400* 81 135*  BUN 18 17 20   CREATININE 1.25* 1.30* 1.36*  CALCIUM 8.2* 8.7* 7.9*     Recent Results (from the past 240 hour(s))  Urine Culture (for pregnant, neutropenic or urologic patients or patients with an indwelling urinary catheter)     Status: Abnormal   Collection Time: 03/04/23 10:20 AM   Specimen: Urine, Clean Catch  Result Value Ref Range Status   Specimen Description URINE, CLEAN CATCH  Final    Special Requests NONE  Final   Culture (A)  Final    <10,000 COLONIES/mL INSIGNIFICANT GROWTH Performed at [facility], [address] [place], [place] [address]    Report Status 03/05/2023 FINAL  Final  Anaerobic culture w Gram Stain     Status: None (Preliminary result)   Collection Time: 03/05/23  2:49 PM   Specimen: Joint, Right Hip; Synovial Fluid  Result Value Ref Range Status   Specimen Description SYNOVIAL  Final   Special Requests RIGHT HIP  Final   Gram Stain   Final    FEW WBC PRESENT,BOTH PMN AND MONONUCLEAR NO ORGANISMS SEEN Performed at [facility], [address] [place], [place] [address]    Culture PENDING  Incomplete   Report Status PENDING  Incomplete  Body fluid culture w Gram Stain     Status: None (Preliminary  result)   Collection Time: 03/05/23  2:49 PM   Specimen: Joint, Right Hip; Synovial Fluid  Result Value Ref Range Status   Specimen Description SYNOVIAL  Final   Special Requests RIGHT HIP  Final   Gram Stain   Final    FEW WBC PRESENT, PREDOMINANTLY PMN NO ORGANISMS SEEN    Culture   Final    NO GROWTH < 24 HOURS Performed at [facility], [address], [place], [place] [address]    Report Status PENDING  Incomplete     Radiology Studies: DG FLUORO GUIDED NEEDLE [facility] ASPIRATION/INJECTION LOC  Result Date: 03/05/2023 CLINICAL DATA:  Provided history: Right hip pain. Request received for therapeutic joint injection of anesthetic and steroid. EXAM: RIGHT HIP INJECTION UNDER FLUOROSCOPY COMPARISON:  MRI of the right hip 03/04/2023. FLUOROSCOPY: Radiation Exposure Index (as provided by the fluoroscopic device): 1.70 mGy Kerma PROCEDURE: Mina Marble, PA-C obtained informed consent from the patient prior to the procedure. This process included a discussion of procedure risks. The patient was positioned supine on the fluoroscopy table. An appropriate skin entry site was determined under fluoroscopy and marked. The operator donned  sterile gloves and a mask. The skin entry site was prepped and draped in the usual sterile fashion. Local anesthesia was provided with 1% lidocaine. Subsequently under intermittent fluoroscopy, a 22 gauge needle was advanced into the right femoroacetabular joint (at the superomedial aspect of the femoral head/neck junction). There was spontaneous return of green/yellow-tinged synovial fluid. At this point, this finding was discussed with the orthopedic service who recommended the synovial fluid be sent for analysis, and that steroid not be injected. A 5 mL sample of synovial fluid was collected and sent for laboratory studies. 5 mL of bupivacaine (without steroid) was injected into the joint space. The inner stylet was replaced within the needle and the needle was removed in its entirety. A dressing was applied to the skin entry site. The patient tolerated the procedure well and no immediate post-procedure complication was apparent. IMPRESSION: 1. Fluoroscopically-guided right hip joint aspiration yielding 5 mL of green/yellow-tinged synovial fluid, which was sent to the laboratory for analysis. 2. Technically successful fluoroscopically-guided injection of anesthetic into the right hip joint. 3. No immediate post-procedure complication. Electronically Signed   By: Jackey Loge D.O.   On: 03/05/2023 15:20   MR HIP RIGHT W WO CONTRAST  Result Date: 03/05/2023 CLINICAL DATA:  Severe right hip pain without acute injury. EXAM: MRI OF THE RIGHT HIP WITHOUT AND WITH CONTRAST TECHNIQUE: Multiplanar, multisequence MR imaging was performed both before and after administration of intravenous contrast. CONTRAST:  7mL GADAVIST GADOBUTROL 1 MMOL/ML IV SOLN COMPARISON:  CT right hip and right hip x-rays from yesterday. FINDINGS: Bones: There is no evidence of acute fracture, dislocation or avascular necrosis. No focal bone lesion. The visualized sacroiliac joints and symphysis pubis appear normal. Articular cartilage and  labrum Articular cartilage: Mild partial-thickness cartilage loss in both hip joints. Labrum: Right anterior superior labral tear (series 4, images 30-31). No paralabral abnormality. Joint or bursal effusion Joint effusion: Small symmetric bilateral hip joint effusions. Bursae: No focal periarticular fluid collection. Muscles and tendons Muscles and tendons: Edema in the right proximal vastus lateralis muscle. Symmetric mild edema in the bilateral proximal adductor muscles. Symmetric edema surrounding the bilateral rectus femoris muscle indirect heads. No significant muscle atrophy. The visualized gluteus, hamstring and iliopsoas tendons are intact. Other findings Miscellaneous: Trace free fluid in the pelvis, nonspecific. Small amount of dependent air  in the bladder, presumably due to recent instrumentation IMPRESSION: 1. No acute osseous abnormality. 2. Right proximal vastus lateralis muscle strain. 3. Right anterior superior labral tear. 4. Mild bilateral hip osteoarthritis. Electronically Signed   By: Obie Dredge M.D.   On: 03/05/2023 09:21    Scheduled Meds:  atorvastatin  40 mg Oral q morning   buPROPion  150 mg Oral q morning   clopidogrel  75 mg Oral Daily   enoxaparin (LOVENOX) injection  40 mg Subcutaneous Q24H   folic acid  1 mg Oral q morning   insulin aspart  0-9 Units Subcutaneous TID WC   levothyroxine  50 mcg Oral QAC breakfast   losartan  50 mg Oral q morning   methylPREDNISolone acetate  80 mg Intra-articular Once   phenazopyridine  100 mg Oral TID WC   polyethylene glycol  17 g Oral Daily   predniSONE  20 mg Oral BID WC   sertraline  100 mg Oral q morning   Continuous Infusions:   LOS: 1 day   Burnadette Pop, MD [facility] P8/23/2024, 1:39 PM

## 2023-03-06 NOTE — TOC Progression Note (Signed)
Transition of Care [facility]) - Progression Note    Patient Details  Name: Michelle Nicholson MRN: [id number] Date of Birth: [birth year]  Transition of Care [facility]) CM/SW Contact  Lockie Pares, RN Phone Number: 03/06/2023, 5:55 PM  Clinical Narrative:    Received an update from PT, they are now recommending Home health. The patient will stay at her boyfriends house, they have a ramp. Requesting walker. Will reach out to patient tomorrow for [facility] choice.  TOC will follow   Expected Discharge Plan: Skilled Nursing Facility Barriers to Discharge: SNF Pending bed offer, Insurance Authorization, Continued Medical Work up  Expected Discharge Plan and Services In-house Referral: Clinical Social Work     Living arrangements for the past 2 months: Single Family Home                                       Social Determinants of Health (SDOH) Interventions SDOH Screenings   Food Insecurity: No Food Insecurity (03/04/2023)  Housing: Low Risk  (03/04/2023)  Transportation Needs: No Transportation Needs (03/04/2023)  Utilities: Not At Risk (03/04/2023)  Financial Resource Strain: Low Risk  (12/30/2022)   Received from [facility]  Physical Activity: Insufficiently Active (11/16/2019)   Received from [facility], [facility]  Social Connections: Unknown (11/22/2021)   Received from [facility], [facility]  Stress: No Stress Concern Present (04/09/2021)   Received from [facility], [facility]  Tobacco Use: Medium Risk (03/03/2023)    Readmission Risk Interventions    12/03/2021   11:36 AM  Readmission Risk Prevention Plan  Transportation Screening Complete  PCP or Specialist Appt within 3-5 Days Complete  Social Work Consult for Recovery Care Planning/Counseling Complete  Palliative Care Screening Not Applicable  Medication Review Oceanographer) Complete

## 2023-03-07 ENCOUNTER — Encounter (HOSPITAL_COMMUNITY): Payer: Self-pay | Admitting: Internal Medicine

## 2023-03-07 ENCOUNTER — Other Ambulatory Visit (HOSPITAL_COMMUNITY): Payer: Self-pay

## 2023-03-07 DIAGNOSIS — M25551 Pain in right hip: Secondary | ICD-10-CM | POA: Diagnosis not present

## 2023-03-07 DIAGNOSIS — M1611 Unilateral primary osteoarthritis, right hip: Secondary | ICD-10-CM

## 2023-03-07 DIAGNOSIS — S73191A Other sprain of right hip, initial encounter: Secondary | ICD-10-CM | POA: Insufficient documentation

## 2023-03-07 HISTORY — DX: Other sprain of right hip, initial encounter: S73.191A

## 2023-03-07 HISTORY — DX: Unilateral primary osteoarthritis, right hip: M16.11

## 2023-03-07 LAB — BASIC METABOLIC PANEL
Anion gap: 14 (ref 5–15)
BUN: 21 mg/dL (ref 8–23)
CO2: 16 mmol/L — ABNORMAL LOW (ref 22–32)
Calcium: 8.4 mg/dL — ABNORMAL LOW (ref 8.9–10.3)
Chloride: 101 mmol/L (ref 98–111)
Creatinine, Ser: 1.07 mg/dL — ABNORMAL HIGH (ref 0.44–1.00)
GFR, Estimated: 58 mL/min — ABNORMAL LOW (ref >60)
Glucose, Bld: 305 mg/dL — ABNORMAL HIGH (ref 70–99)
Potassium: 4.1 mmol/L (ref 3.5–5.1)
Sodium: 131 mmol/L — ABNORMAL LOW (ref 135–145)

## 2023-03-07 LAB — CBC
HCT: 31.9 % — ABNORMAL LOW (ref 36.0–46.0)
Hemoglobin: 10.5 g/dL — ABNORMAL LOW (ref 12.0–15.0)
MCH: 29.9 pg (ref 26.0–34.0)
MCHC: 32.9 g/dL (ref 30.0–36.0)
MCV: 90.9 fL (ref 80.0–100.0)
Platelets: 205 10*3/uL (ref 150–400)
RBC: 3.51 MIL/uL — ABNORMAL LOW (ref 3.87–5.11)
RDW: 14.3 % (ref 11.5–15.5)
WBC: 6 10*3/uL (ref 4.0–10.5)
nRBC: 0 % (ref 0.0–0.2)

## 2023-03-07 LAB — VAS US ABI WITH/WO TBI
Left ABI: 1.05
Right ABI: 0.79

## 2023-03-07 LAB — GLUCOSE, CAPILLARY
Glucose-Capillary: 220 mg/dL — ABNORMAL HIGH (ref 70–99)
Glucose-Capillary: 304 mg/dL — ABNORMAL HIGH (ref 70–99)

## 2023-03-07 MED ORDER — LOSARTAN POTASSIUM 50 MG PO TABS
100.0000 mg | ORAL_TABLET | Freq: Every morning | ORAL | Status: DC
  Administered 2023-03-07: 100 mg via ORAL
  Filled 2023-03-07: qty 2

## 2023-03-07 MED ORDER — PREDNISONE 10 MG PO TABS
10.0000 mg | ORAL_TABLET | Freq: Every day | ORAL | 0 refills | Status: DC
  Filled 2023-03-07: qty 14, 6d supply, fill #0

## 2023-03-07 NOTE — Progress Notes (Signed)
Physical Therapy Treatment Patient Details Name: Michelle Nicholson MRN: [id number] DOB: [birth year] Today's Date: 03/07/2023   History of Present Illness Michelle Nicholson is a 62 yo female who presented after a fall with R hip pain. X-rays and CT scan of the hip are unremarkable,  MRI of the right hip showed right proximal vastus lateralis muscle strain, labral tear, arthritis. Urinalysis is consistent with UTI. Of note pt had a fall 8/10 with L patellar fx. PMHx: none on file.    PT Comments  Pt received in recliner, agreeable to therapy session, with good participation and tolerance for transfer and gait training. Pt KI had issue where rod was sliding out from brace, so tape added to reinforce area and pt shown how to adjust KI properly (keeping it raised higher over thigh) to prevent L knee flexion with functional mobility tasks. Pt needing up to Supervision for sit<>stand and gait safety this date using RW. Pt most limited due to c/o R shoulder pain, similar to previous date. Pt encouraged to follow up with ortho MD regarding her shoulder pain post-acute if this persists. Pt continues to benefit from PT services to progress toward functional mobility goals.      If plan is discharge home, recommend the following: A little help with walking and/or transfers;Assistance with cooking/housework;Assist for transportation;Help with stairs or ramp for entrance;A little help with bathing/dressing/bathroom   Can travel by private vehicle     Yes  Equipment Recommendations  Rolling walker (2 wheels);BSC/3in1 (pt states she already has 3in1 at home)    Recommendations for Other Services       Precautions / Restrictions Precautions Precautions: Fall Precaution Comments: fall 10 days ago with L patellar fx, KI, WBAT. Required Braces or Orthoses: Knee Immobilizer - Left Knee Immobilizer - Left: On at all times Restrictions Weight Bearing Restrictions: Yes LLE Weight Bearing: Weight bearing as tolerated      Mobility  Bed Mobility Overal bed mobility: Needs Assistance             General bed mobility comments: pt received in chair    Transfers Overall transfer level: Needs assistance Equipment used: Rolling walker (2 wheels) Transfers: Sit to/from Stand Sit to Stand: Supervision           General transfer comment: Cues for safer UE placement and foot placement; to/from recliner    Ambulation/Gait Ambulation/Gait assistance: Supervision Gait Distance (Feet): 100 Feet Assistive device: Rolling walker (2 wheels) Gait Pattern/deviations: Step-to pattern, Decreased step length - right, Decreased step length - left, Step-through pattern Gait velocity: decr     General Gait Details: Cues for safety, improved sequencing with RW and energy conservation; pt reports her R shoulder is most painful area so distance limited due to this, pt agreeable to icing her R shoulder upon return to the room.   Stairs             Wheelchair Mobility     Tilt Bed    Modified Rankin (Stroke Patients Only)       Balance Overall balance assessment: Needs assistance Sitting-balance support: No upper extremity supported, Feet supported Sitting balance-Leahy Scale: Good     Standing balance support: No upper extremity supported, During functional activity, Bilateral upper extremity supported Standing balance-Leahy Scale: Fair Standing balance comment: RW                            Cognition Arousal: Alert Behavior During Therapy: WFL for  tasks assessed/performed Overall Cognitive Status: Within Functional Limits for tasks assessed                                          Exercises Other Exercises Other Exercises: reviewed BUE exercises to address R shoulder "stiff" feeling and discomfort from increased RW use, including BUE: shoulder shrugs/circles, scapular retraction, chin tucks, pt receptive and will follow-up with HHPT post-acute    General  Comments General comments (skin integrity, edema, etc.): ice pack to sore R shoulder      Pertinent Vitals/Pain Pain Assessment Pain Assessment: 0-10 Pain Score: 2  Faces Pain Scale: Hurts little more Pain Location: R hip, L knee, R shoulder Pain Descriptors / Indicators: Grimacing, Guarding, Sore Pain Intervention(s): Monitored during session, Repositioned, Limited activity within patient's tolerance, Ice applied    Home Living                          Prior Function            PT Goals (current goals can now be found in the care plan section) Acute Rehab PT Goals Patient Stated Goal: decr pain to return home PT Goal Formulation: With patient Time For Goal Achievement: 03/19/23 Progress towards PT goals: Progressing toward goals    Frequency    Min 1X/week      PT Plan      Co-evaluation              AM-PAC PT "6 Clicks" Mobility   Outcome Measure  Help needed turning from your back to your side while in a flat bed without using bedrails?: None Help needed moving from lying on your back to sitting on the side of a flat bed without using bedrails?: A Little Help needed moving to and from a bed to a chair (including a wheelchair)?: A Little Help needed standing up from a chair using your arms (e.g., wheelchair or bedside chair)?: A Little Help needed to walk in hospital room?: A Little Help needed climbing 3-5 steps with a railing? : A Lot 6 Click Score: 18    End of Session Equipment Utilized During Treatment: Gait belt Activity Tolerance: Patient tolerated treatment well;Patient limited by pain Patient left: with call bell/phone within reach;in chair Nurse Communication: Mobility status PT Visit Diagnosis: Other abnormalities of gait and mobility (R26.89);Pain Pain - Right/Left: Right Pain - part of body: Shoulder     Time: 0454-0981 PT Time Calculation (min) (ACUTE ONLY): 23 min  Charges:    $Gait Training: 8-22 mins $Therapeutic  Activity: 8-22 mins PT General Charges $$ ACUTE PT VISIT: 1 Visit                     Fredi Hurtado P., PTA Acute Rehabilitation Services Secure Chat Preferred 9a-5:30pm Office: [phone]    Dorathy Kinsman [facility] 03/07/2023, 11:22 AM

## 2023-03-07 NOTE — Plan of Care (Signed)

## 2023-03-07 NOTE — Consult Note (Signed)
Vascular and Vein Specialist of [facility]  Patient name: Michelle Nicholson MRN: [id number] DOB: 1961-03-05 Sex: female   REQUESTING PROVIDER:   Hospital service   REASON FOR CONSULT:    PAD  HISTORY OF PRESENT ILLNESS:   Michelle Nicholson is a 62 y.o. female, who was admitted on 03/04/2023 with right hip pain.  She fell about a week ago and fractured her left patella.  She was put into a brace.  She also injured her right hip.  During her workup, she underwent vascular evaluation which showed decreased ABIs on the right with some stenosis in the SFA.  The patient states that she is followed by [facility] vascular and has undergone angioplasty in the past.  Currently, she denies any claudication however she had trouble dancing which is why she underwent her procedure 2 years ago.  The patient is a diabetic.  She is on a statin for hypercholesterolemia.  She has a history of carotid artery stenosis, status post endarterectomy.  She has stage IIIa renal insufficiency at baseline.  She is medically managed for hypertension on a ARB.  She is a former smoker.  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, equilibrium     FAMILY HISTORY   Family History  Problem Relation Age of Onset   Cancer Mother    Hypertension Father    Diabetes Father    Cancer Sister    Diabetes Sister    Diabetes Brother    Kidney failure Brother    CAD Brother    Diabetes Other     SOCIAL HISTORY:   Social History   Socioeconomic History    Marital status: Widowed    Spouse name: Not on file   Number of children: Not on file   Years of education: Not on file   Highest education level: Not on file  Occupational History   Not on file  Tobacco Use   Smoking status: Former    Current packs/day: 0.00    Types: Cigarettes    Quit date: 11/27/2021    Years since quitting: 1.2   Smokeless tobacco: Never  Vaping Use   Vaping status: Never Used  Substance and Sexual Activity   Alcohol use: No    Comment: 2 drinks a month   Drug use: No   Sexual activity: Not on file  Other Topics Concern   Not on file  Social History Narrative   Not on file   Social Determinants of Health   Financial Resource Strain: Low Risk  (12/30/2022)   Received from [facility]   Overall Financial Resource Strain (CARDIA)    Difficulty of Paying Living Expenses: Not hard at all  Food Insecurity: No Food Insecurity (03/04/2023)   Hunger Vital Sign    Worried About Running Out of Food in the Last Year: Never true    Ran Out of Food in  the Last Year: Never true  Transportation Needs: No Transportation Needs (03/04/2023)   PRAPARE - Administrator, Civil Service (Medical): No    Lack of Transportation (Non-Medical): No  Physical Activity: Insufficiently Active (11/16/2019)   [facility]   Exercise Vital Sign    Days of Exercise per Week: 3 days    Minutes of Exercise per Session: 20 min  Stress: No Stress Concern Present (04/09/2021)   [facility] - Occupational Stress Questionnaire    Feeling of Stress : Not at all  Social Connections: Unknown (11/22/2021)   Received from [facility]   Social Network    Social Network: Not on file  Intimate Partner Violence: Not At Risk (03/04/2023)   Humiliation, Afraid, Rape, and Kick questionnaire    Fear of Current or  Ex-Partner: No    Emotionally Abused: No    Physically Abused: No    Sexually Abused: No    ALLERGIES:    Allergies  Allergen Reactions   Dorzolamide Hcl-Timolol Mal Itching and Other (See Comments)    Makes her eyes burn. Conjunctival Injection with Burning. Visual Acuity Blurry   Brimonidine Rash and Other (See Comments)    Follicular conjunctivitis   Netarsudil-Latanoprost Other (See Comments)    Redness, tearing, burning    CURRENT MEDICATIONS:    Current Facility-Administered Medications  Medication Dose Route Frequency Provider Last Rate Last Admin   acetaminophen (TYLENOL) tablet 1,000 mg  1,000 mg Oral Daily PRN Adhikari, Amrit, MD       atorvastatin (LIPITOR) tablet 40 mg  40 mg Oral q morning Adhikari, Amrit, MD   40 mg at 03/07/23 0919   buPROPion (WELLBUTRIN XL) 24 hr tablet 150 mg  150 mg Oral q morning Burnadette Pop, MD   150 mg at 03/07/23 0919   clopidogrel (PLAVIX) tablet 75 mg  75 mg Oral Daily Adhikari, Willia Craze, MD   75 mg at 03/07/23 0919   enoxaparin (LOVENOX) injection 40 mg  40 mg Subcutaneous Q24H Burnadette Pop, MD   40 mg at 03/06/23 1311   folic acid (FOLVITE) tablet 1 mg  1 mg Oral q morning Burnadette Pop, MD   1 mg at 03/07/23 0919   HYDROcodone-acetaminophen (NORCO/VICODIN) 5-325 MG per tablet 1 tablet  1 tablet Oral Q6H PRN Burnadette Pop, MD   1 tablet at 03/07/23 0236   HYDROmorphone (DILAUDID) injection 0.5-1 mg  0.5-1 mg Intravenous Q2H PRN Burnadette Pop, MD   0.5 mg at 03/06/23 2119   insulin aspart (novoLOG) injection 0-9 Units  0-9 Units Subcutaneous TID WC Adhikari, Amrit, MD   3 Units at 03/07/23 0920   iohexol (OMNIPAQUE) 180 MG/ML injection 10 mL  10 mL Intra-articular Once PRN Montez Morita, PA-C       latanoprost (XALATAN) 0.005 % ophthalmic solution 1 drop  1 drop Both Eyes QHS Burnadette Pop, MD   1 drop at 03/06/23 2235   levothyroxine (SYNTHROID) tablet 50 mcg  50 mcg Oral QAC breakfast Burnadette Pop, MD   50 mcg at 03/07/23  0558   losartan (COZAAR) tablet 100 mg  100 mg Oral q morning Burnadette Pop, MD   100 mg at 03/07/23 0919   methocarbamol (ROBAXIN) tablet 750 mg  750 mg Oral Q6H PRN Burnadette Pop, MD   750 mg at 03/06/23 2118   methylPREDNISolone acetate (DEPO-MEDROL) injection (RADIOLOGY ONLY) 80 mg  80 mg Intra-articular Once Montez Morita, PA-C       phenazopyridine (PYRIDIUM) tablet 100 mg  100 mg Oral TID WC Burnadette Pop, MD   100 mg at 03/07/23 0919   polyethylene glycol (MIRALAX / GLYCOLAX) packet 17 g  17 g Oral Daily Burnadette Pop, MD   17 g at 03/05/23 1191   predniSONE (DELTASONE) tablet 20 mg  20 mg Oral BID WC Montez Morita, PA-C   20 mg at 03/07/23 4782   sertraline (ZOLOFT) tablet 100 mg  100 mg Oral q morning Burnadette Pop, MD   100 mg at 03/07/23 9562    REVIEW OF SYSTEMS:   [X]  denotes positive finding, [ ]  denotes negative finding Cardiac  Comments:  Chest pain or chest pressure:    Shortness of breath upon exertion:    Short of breath when lying flat:    Irregular heart rhythm:        Vascular    Pain in calf, thigh, or hip brought on by ambulation:    Pain in feet at night that wakes you up from your sleep:     Blood clot in your veins:    Leg swelling:         Pulmonary    Oxygen at home:    Productive cough:     Wheezing:         Neurologic    Sudden weakness in arms or legs:     Sudden numbness in arms or legs:     Sudden onset of difficulty speaking or slurred speech:    Temporary loss of vision in one eye:     Problems with dizziness:         Gastrointestinal    Blood in stool:      Vomited blood:         Genitourinary    Burning when urinating:     Blood in urine:        Psychiatric    Major depression:         Hematologic    Bleeding problems:    Problems with blood clotting too easily:        Skin    Rashes or ulcers:        Constitutional    Fever or chills:     PHYSICAL EXAM:   Vitals:   03/06/23 1723 03/06/23 2017 03/07/23 0406  03/07/23 0742  BP: (!) 154/61 (!) 175/60 (!) 175/59 (!) 173/64  Pulse: (!) 59 70 65 (!) 58  Resp: 16 18 18 16   Temp: 98.4 F (36.9 C) 98.2 F (36.8 C) 98.1 F (36.7 C) 97.9 F (36.6 C)  TempSrc: Oral Oral Oral Oral  SpO2: 96% 95% 92% 93%  Weight:      Height:        GENERAL: The patient is a well-nourished female, in no acute distress. The vital signs are documented above. CARDIAC: There is a regular rate and rhythm.  VASCULAR: I had difficulty palpating pedal pulses PULMONARY: Nonlabored respirations ABDOMEN: Soft and non-tender  MUSCULOSKELETAL: There are no major deformities or cyanosis. NEUROLOGIC: No focal weakness or paresthesias are detected. SKIN: There are no ulcers or rashes noted. PSYCHIATRIC: The patient has a normal affect.  STUDIES:   I have reviewed the following: ABI/TBIToday's ABIToday's TBIPrevious ABIPrevious TBI  +-------+-----------+-----------+------------+------------+  Right 0.79       0.44                                 +-------+-----------+-----------+------------+------------+  Left  1.05       0.88                                 +-------+-----------+-----------+------------+------------+   Right: 75-99% stenosis noted in the distal superficial femoral artery.  ASSESSMENT and PLAN   PAD: She is currently under the care of [facility] having undergone intervention with them in the past.  There are no acute vascular surgical issues currently.  She can be discharged from my perspective with follow-up with her primary vascular surgeon in [facility].   Charlena Cross, MD, FACS Vascular and Vein Specialists of [facility] [phone] Pager [phone]

## 2023-03-07 NOTE — TOC Transition Note (Signed)
Transition of Care [facility]) - CM/SW Discharge Note   Patient Details  Name: Michelle Nicholson MRN: [id number] Date of Birth: 02/05/1961  Transition of Care [facility]) CM/SW Contact:  Lawerance Sabal, RN Phone Number: 03/07/2023, 10:51 AM   Clinical Narrative:     Sherron Monday w patient over the phone. She would like HH services set up. Agreeable to [facility].  RW to be delivered to room prior to DC through [employer].  Final next level of care: Home w Home Health Services Barriers to Discharge: No Barriers Identified   Patient Goals and CMS Choice CMS Medicare.gov Compare Post Acute Care list provided to:: Patient Choice offered to / list presented to : Patient  Discharge Placement                         Discharge Plan and Services Additional resources added to the After Visit Summary for   In-house Referral: Clinical Social Work              DME Arranged: Dan Humphreys rolling DME Agency: [employer] Date DME Agency Contacted: 03/07/23 Time DME Agency Contacted: 1050 Representative spoke with at DME Agency: Vaughan Basta HH Arranged: PT, OT HH Agency: [facility] Date [facility] Agency Contacted: 03/07/23 Time HH Agency Contacted: 1051 Representative spoke with at [facility] Agency: Kandee Keen  Social Determinants of Health (SDOH) Interventions SDOH Screenings   Food Insecurity: No Food Insecurity (03/04/2023)  Housing: Low Risk  (03/04/2023)  Transportation Needs: No Transportation Needs (03/04/2023)  Utilities: Not At Risk (03/04/2023)  Financial Resource Strain: Low Risk  (12/30/2022)   Received from [facility]  Physical Activity: Insufficiently Active (11/16/2019)   Received from [facility]  Social Connections: Unknown (11/22/2021)   Received from [facility]  Stress: No Stress Concern Present (04/09/2021)   Received from [facility]  Tobacco Use: Medium Risk (03/03/2023)     Readmission Risk  Interventions    12/03/2021   11:36 AM  Readmission Risk Prevention Plan  Transportation Screening Complete  PCP or Specialist Appt within 3-5 Days Complete  Social Work Consult for Recovery Care Planning/Counseling Complete  Palliative Care Screening Not Applicable  Medication Review Ocean[employer]) Complete

## 2023-03-07 NOTE — TOC Transition Note (Signed)
Discharge medications are filled and stored in the [facility] awaiting patient discharge.  ** WILL NEED TO BE PICKED UP **

## 2023-03-07 NOTE — Plan of Care (Signed)
  Problem: Activity: Goal: Risk for activity intolerance will decrease Note: Patient is able to ambulate with walker.  While it is painful to her she is able to ambulate without fatigue.     Problem: Elimination: Goal: Will not experience complications related to bowel motility Note: Patient having good output.  Up to bathroom several times per shift.    Problem: Pain Managment: Goal: General experience of comfort will improve Note: Patient uses 1-10 pain scale.  Pain is in L hip and knee when present.  Dilaudid IVPush effective in bringing pain from 7 to 0.  Warm blankets, skeletal muscle relaxant and positioning added to enhance pain control.

## 2023-03-07 NOTE — Discharge Summary (Signed)
Physician Discharge Summary  Kalyana Fluhr NWG:[id number] DOB: 01-Sep-1960 DOA: 03/03/2023  PCP: Bailey Mech, PA-C  Admit date: 03/03/2023 Discharge date: 03/07/2023  Admitted From: Home Disposition:  Home  Discharge Condition:Stable CODE STATUS:FULL Diet recommendation: Heart Healthy   Brief/Interim Summary: Patient is a 62 y.o. female with medical history significant of chronic ambulatory problem, CVA, peripheral artery disease, diabetes type 1, hypertension, bilateral carotid stenosis status post endarterectomy,tobacco use, reactive airway disease who presented to the emergency department at [facility] with complaint of right hip pain .she fell about a week ago and fractures her left patella .she follows with Dr. Shawnie Dapper at [facility] orthopedics.  She was put in a knee brace and was recommended for outpatient follow-up.  She was walking with her left knee brace yesterday and attempted to step up a curb  when she developed severe  right sided hip pain and became unable to walk .  CT of right hip did not show any acute fracture or dislocation.  MRI of the right hip right proximal vastus lateralis muscle strain, labral tear, arthritis.  Orthopedics consulted.  Orthopedics consulted radiology for possible steroid  injection.  Steroid injection not given due to suspicion for septic joint but fluid analysis is not suggestive.  Plan to continue oral steroids in tapering style.  PT/OT recommending HH on DC.  Medically stable for discharge home today  Following problems were addressed during the hospitalization:  Recurrent falls/right hip pain/left patellar fracture: Report of fall about a week ago resulting in fractured patella of the left.  Follows with Dr. Shawnie Dapper at [facility] orthopedics.  Has a knee brace on the left.  Developed severe pain on the right hip while attempting to step up a curb yesterday while wearing the brace. X-ray of the hip did not show any fracture or  dislocation.  CT right hip without contrast did not show any acute fracture or dislocation .continue pain management, supportive care.  MRI of the right hip right proximal vastus lateralis muscle strain, labral tear, arthritis.   Orthopedics consulted radiology for possible steroid  injection.  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  Plan to continue oral steroids.  PT/OT recommending home health on discharge.  She will follow-up with orthopedics at [facility].   Concern for UTI: UA was suspicious for UTI.  Urine culture ordered.  Patient does not have any leukocytosis or dysuria.  Will hold antibiotics for now.   Diabetes type 2: Takes insulin at home.  A1c on 11/28/2021 was 7.8   Hyperlipidemia: Takes Lipitor.   History of CVA/carotid artery stenosis: Status post left CEA.  Takes aspirin, Plavix, Lipitor.  Follows with vascular surgery.ABI and arterial duplex of RLE which showed moderate right lower extremity arterial disease, 75-99% stenosis noted in the distal superficial femoral artery.  Vascular surgery consulted here, recommend outpatient follow-up with her own vascular surgeon and continue aspirin, Plavix and statin as an outpatient  Hypothyroidism: Continue levothyroxine   CKD stage IIIa: Currently kidney function at baseline. Creatinine around 1.3.   Hypertension: Continue losartan.  Currently stable   History of anxiety/depression: On Zoloft, bupropion      Discharge Diagnoses:  Principal Problem:   Right hip pain Active Problems:   Recurrent UTI   Uncontrolled type 2 diabetes mellitus with hyperglycemia, with long-term current use of insulin (HCC)   Essential hypertension   Dyslipidemia  Carotid artery stenosis   Pain in right hip    Discharge Instructions  Discharge Instructions     Diet - low sodium heart healthy   Complete by: As directed     Discharge instructions   Complete by: As directed    1)Please take prescribed medications as instructed 2)Follow up with your PCP in a week.  Follow-up with your orthopedics and vascular surgery provider as an outpatient 3)Follow up with Home health   Increase activity slowly   Complete by: As directed       Allergies as of 03/07/2023       Reactions   Dorzolamide Hcl-timolol Mal Itching, Other (See Comments)   Makes her eyes burn. Conjunctival Injection with Burning. Visual Acuity Blurry   Brimonidine Rash, Other (See Comments)   Follicular conjunctivitis   Netarsudil-latanoprost Other (See Comments)   Redness, tearing, burning        Medication List     STOP taking these medications    gabapentin 300 MG capsule Commonly known as: NEURONTIN       TAKE these medications    acetaminophen 500 MG tablet Commonly known as: TYLENOL Take 1,000 mg by mouth daily as needed for headache (pain).   albuterol 108 (90 Base) MCG/ACT inhaler Commonly known as: VENTOLIN HFA Inhale 1-2 puffs into the lungs every 6 (six) hours as needed for wheezing or shortness of breath.   aspirin 325 MG tablet Take 1 tablet (325 mg total) by mouth daily.   atorvastatin 80 MG tablet Commonly known as: LIPITOR Take 80 mg by mouth in the morning.   buPROPion 150 MG 24 hr tablet Commonly known as: WELLBUTRIN XL Take 150 mg by mouth every morning.   CAL-MAG-ZINC PO Take 1 tablet by mouth every morning.   clopidogrel 75 MG tablet Commonly known as: PLAVIX Take 1 tablet by mouth once daily   estradiol 0.1 MG/GM vaginal cream Commonly known as: ESTRACE Place 1 Applicatorful vaginally at bedtime.   FeroSul 325 (65 Fe) MG tablet Generic drug: ferrous sulfate Take 1 tablet (325 mg total) by mouth 2 (two) times daily with a meal. What changed: when to take this   fluorouracil 5 % cream Commonly known as: EFUDEX Apply 1 application. topically daily as needed (skin cancer breakouts).    fluticasone-salmeterol 100-50 MCG/ACT Aepb Commonly known as: ADVAIR Inhale 1 puff into the lungs daily as needed (shortness of breath).   folic acid 1 MG tablet Commonly known as: FOLVITE Take 1 mg by mouth every morning.   Gemtesa 75 MG Tabs Generic drug: Vibegron Take 1 tablet by mouth daily.   HYDROcodone-acetaminophen 5-325 MG tablet Commonly known as: NORCO/VICODIN Take 1 tablet by mouth every 6 (six) hours as needed for severe pain.   insulin aspart 100 UNIT/ML FlexPen Commonly known as: NOVOLOG Inject 4 Units into the skin See admin instructions. Inject 4 units subcutaneously three times daily after meals - plus sliding scale adjustment - add 1 unit for every 50 units over 200   insulin degludec 100 UNIT/ML FlexTouch Pen Commonly known as: Evaristo Bury FlexTouch Inject 7 Units into the skin daily. What changed:  how much to take when to take this additional instructions   latanoprost 0.005 % ophthalmic solution Commonly known as: XALATAN Place 1 drop into both eyes at bedtime.   levothyroxine 50 MCG tablet Commonly known as: SYNTHROID Take 50 mcg by mouth daily before breakfast.   losartan 100 MG tablet Commonly known as: COZAAR Take 100 mg by  mouth daily.   meloxicam 15 MG tablet Commonly known as: MOBIC Take 15 mg by mouth daily.   methazolamide 50 MG tablet Commonly known as: NEPTAZANE Take 50 mg by mouth 2 (two) times daily.   multivitamin with minerals Tabs tablet Take 1 tablet by mouth every morning.   Pancrelipase (Lip-Prot-Amyl) 24000-76000 units Cpep Take 2 capsules by mouth daily after breakfast.   predniSONE 10 MG tablet Commonly known as: DELTASONE Take 1 tablet (10 mg total) by mouth daily. Take 4 pills daily for 2 days then 2 pills daily for 2 days then 1 pill daily for 2 days then stop   pregabalin 300 MG capsule Commonly known as: LYRICA Take 300 mg by mouth 2 (two) times daily.   sertraline 100 MG tablet Commonly known as:  ZOLOFT Take 100 mg by mouth every morning.   sodium chloride 1 g tablet Take 1 g by mouth 3 (three) times daily as needed (brain fog from sodium insufficiency).   Vitamin D (Ergocalciferol) 1.25 MG (50000 UNIT) Caps capsule Commonly known as: DRISDOL Take 50,000 Units by mouth every Sunday.        Follow-up Information     Podraza, Cole Christopher, PA-C. Schedule an appointment as soon as possible for a visit in 1 week(s).   Specialty: Physician Assistant Contact information: [id number] [place] [address] [phone]                Allergies  Allergen Reactions   Dorzolamide Hcl-Timolol Mal Itching and Other (See Comments)    Makes her eyes burn. Conjunctival Injection with Burning. Visual Acuity Blurry   Brimonidine Rash and Other (See Comments)    Follicular conjunctivitis   Netarsudil-Latanoprost Other (See Comments)    Redness, tearing, burning    Consultations: Orthopedics, vascular surgery, IR   Procedures/Studies: VAS US ABI WITH/WO TBI  Result Date: 03/06/2023  LOWER EXTREMITY DOPPLER STUDY Patient Name:  Amiliah Etheridge  Date of Exam:   03/06/2023 Medical Rec #: [id number]   Accession #:    [id number] Date of Birth: 07/29/1960   Patient Gender: F Patient Age:   61 years Exam Location:  [facility] Procedure:      VAS US ABI WITH/WO TBI Referring Phys: KEITH PAUL --------------------------------------------------------------------------------  Indications: Right hip pain High Risk Factors: Hypertension, hyperlipidemia, Diabetes, past history of                    smoking, prior CVA.  Vascular Interventions: Carotid artery stenosis hx of left CEA. Comparison Study: No previous exams Performing Technologist: Hill, Jody RVT/RDMS  Examination Guidelines: A complete evaluation includes at minimum, Doppler waveform signals and systolic blood pressure reading at the level of bilateral brachial, anterior tibial, and posterior tibial arteries, when  vessel segments are accessible. Bilateral testing is considered an integral part of a complete examination. Photoelectric Plethysmograph (PPG) waveforms and toe systolic pressure readings are included as required and additional duplex testing as needed. Limited examinations for reoccurring indications may be performed as noted.  ABI Findings: +---------+------------------+-----+----------+--------+ Right    Rt Pressure (mmHg)IndexWaveform  Comment  +---------+------------------+-----+----------+--------+ Brachial 193                    triphasic          +---------+------------------+-----+----------+--------+ PTA      13 4               0.69 monophasic         +---------+------------------+-----+----------+--------+  DP       152               0.79 biphasic           +---------+------------------+-----+----------+--------+ Great Toe84                0.44 Abnormal           +---------+------------------+-----+----------+--------+ +---------+------------------+-----+---------+-------+ Left     Lt Pressure (mmHg)IndexWaveform Comment +---------+------------------+-----+---------+-------+ Brachial 190                    triphasic        +---------+------------------+-----+---------+-------+ PTA      203               1.05 biphasic         +---------+------------------+-----+---------+-------+ DP       198               1.03 triphasic        +---------+------------------+-----+---------+-------+ Great Toe170               0.88 Normal           +---------+------------------+-----+---------+-------+ +-------+-----------+-----------+------------+------------+ ABI/TBIToday's ABIToday's TBIPrevious ABIPrevious TBI +-------+-----------+-----------+------------+------------+ Right  0.79       0.44                                +-------+-----------+-----------+------------+------------+ Left   1.05       0.88                                 +-------+-----------+-----------+------------+------------+   Summary: Right: Resting right ankle-brachial index indicates moderate right lower extremity arterial disease. The right toe-brachial index is abnormal. Left: Resting left ankle-brachial index is within normal range. The left toe-brachial index is normal. *See table(s) above for measurements and observations.     Preliminary    VAS [place] LOWER EXTREMITY ARTERIAL DUPLEX  Result Date: 03/06/2023 LOWER EXTREMITY ARTERIAL DUPLEX STUDY Patient Name:  FATMEH GAGO  Date of Exam:   03/06/2023 Medical Rec #: [id number]   Accession #:    [id number] Date of Birth: December 31, 1960   Patient Gender: F Patient Age:   77 years Exam Location:  [facility] Procedure:      VAS [place] LOWER EXTREMITY ARTERIAL DUPLEX Referring Phys: Montez Morita --------------------------------------------------------------------------------  Indications: Right hip pain. High Risk Factors: Hypertension, hyperlipidemia, Diabetes, past history of                    smoking, prior CVA.  Vascular Interventions: Carotid artery stenosis s/p left CEA. Current ABI:            RT 0.79 LT 1.05 Comparison Study: No previous exams Performing Technologist: Jody Hill RVT, RDMS  Examination Guidelines: A complete evaluation includes B-mode imaging, spectral Doppler, color Doppler, and power Doppler as needed of all accessible portions of each vessel. Bilateral testing is considered an integral part of a complete examination. Limited examinations for reoccurring indications may be performed as noted.  +-----------+--------+-----+---------------+-------------------+--------+ RIGHT      PSV cm/sRatioStenosis       Waveform           Comments +-----------+--------+-----+---------------+-------------------+--------+ EIA Distal 101                         biphasic                    +-----------+--------+-----+---------------+-------------------+--------+  CFA Prox   90                           biphasic                    +-----------+--------+-----+---------------+-------------------+--------+ CFA Distal 68                          biphasic                    +-----------+--------+-----+---------------+-------------------+--------+ DFA        58                          biphasic                    +-----------+--------+-----+---------------+-------------------+--------+ SFA Prox   62                          biphasic                    +-----------+--------+-----+---------------+-------------------+--------+ SFA Mid    62                          biphasic                    +-----------+--------+-----+---------------+-------------------+--------+ SFA Distal 362          75-99% stenosisbiphasic                    +-----------+--------+-----+---------------+-------------------+--------+ POP Prox   72                          biphasic                    +-----------+--------+-----+---------------+-------------------+--------+ POP Distal 68                          biphasic                    +-----------+--------+-----+---------------+-------------------+--------+ TP Trunk   61                          biphasic                    +-----------+--------+-----+---------------+-------------------+--------+ ATA Prox   82                          monophasic                  +-----------+--------+-----+---------------+-------------------+--------+ ATA Mid    63                          monophasic                  +-----------+--------+-----+---------------+-------------------+--------+ ATA Distal 31                          monophasic                  +-----------+--------+-----+---------------+-------------------+--------+ PTA Prox   28  biphasic                    +-----------+--------+-----+---------------+-------------------+--------+ PTA Mid    19                          monophasic                   +-----------+--------+-----+---------------+-------------------+--------+ PTA Distal 8                           dampened monophasic         +-----------+--------+-----+---------------+-------------------+--------+ PERO Prox  40                          biphasic                    +-----------+--------+-----+---------------+-------------------+--------+ PERO Mid   34                          monophasic                  +-----------+--------+-----+---------------+-------------------+--------+ PERO Distal30                          monophasic                  +-----------+--------+-----+---------------+-------------------+--------+ DP         42                          monophasic                  +-----------+--------+-----+---------------+-------------------+--------+  Summary: Right: 75-99% stenosis noted in the distal superficial femoral artery.  See table(s) above for measurements and observations.    Preliminary    DG FLUORO GUIDED NEEDLE PLC ASPIRATION/INJECTION LOC  Result Date: 03/05/2023 CLINICAL DATA:  Provided history: Right hip pain. Request received for therapeutic joint injection of anesthetic and steroid. EXAM: RIGHT HIP INJECTION UNDER FLUOROSCOPY COMPARISON:  MRI of the right hip 03/04/2023. FLUOROSCOPY: Radiation Exposure Index (as provided by the fluoroscopic device): 1.70 mGy Kerma PROCEDURE: Mina Marble, PA-C obtained informed consent from the patient prior to the procedure. This process included a discussion of procedure risks. The patient was positioned supine on the fluoroscopy table. An appropriate skin entry site was determined under fluoroscopy and marked. The operator donned sterile gloves and a mask. The skin entry site was prepped and draped in the usual sterile fashion. Local anesthesia was provided with 1% lidocaine. Subsequently under intermittent fluoroscopy, a 22 gauge needle was advanced into the right femoroacetabular joint (at the  superomedial aspect of the femoral head/neck junction). There was spontaneous return of green/yellow-tinged synovial fluid. At this point, this finding was discussed with the orthopedic service who recommended the synovial fluid be sent for analysis, and that steroid not be injected. A 5 mL sample of synovial fluid was collected and sent for laboratory studies. 5 mL of bupivacaine (without steroid) was injected into the joint space. The inner stylet was replaced within the needle and the needle was removed in its entirety. A dressing was applied to the skin entry site. The patient tolerated the procedure well and no immediate post-procedure complication was apparent. IMPRESSION: 1. Fluoroscopically-guided right hip joint aspiration yielding 5 mL of green/yellow-tinged synovial  fluid, which was sent to the laboratory for analysis. 2. Technically successful fluoroscopically-guided injection of anesthetic into the right hip joint. 3. No immediate post-procedure complication. Electronically Signed   By: Jackey Loge D.O.   On: 03/05/2023 15:20   MR HIP RIGHT W WO CONTRAST  Result Date: 03/05/2023 CLINICAL DATA:  Severe right hip pain without acute injury. EXAM: MRI OF THE RIGHT HIP WITHOUT AND WITH CONTRAST TECHNIQUE: Multiplanar, multisequence MR imaging was performed both before and after administration of intravenous contrast. CONTRAST:  7mL GADAVIST GADOBUTROL 1 MMOL/ML IV SOLN COMPARISON:  CT right hip and right hip x-rays from yesterday. FINDINGS: Bones: There is no evidence of acute fracture, dislocation or avascular necrosis. No focal bone lesion. The visualized sacroiliac joints and symphysis pubis appear normal. Articular cartilage and labrum Articular cartilage: Mild partial-thickness cartilage loss in both hip joints. Labrum: Right anterior superior labral tear (series 4, images 30-31). No paralabral abnormality. Joint or bursal effusion Joint effusion: Small symmetric bilateral hip joint effusions.  Bursae: No focal periarticular fluid collection. Muscles and tendons Muscles and tendons: Edema in the right proximal vastus lateralis muscle. Symmetric mild edema in the bilateral proximal adductor muscles. Symmetric edema surrounding the bilateral rectus femoris muscle indirect heads. No significant muscle atrophy. The visualized gluteus, hamstring and iliopsoas tendons are intact. Other findings Miscellaneous: Trace free fluid in the pelvis, nonspecific. Small amount of dependent air in the bladder, presumably due to recent instrumentation IMPRESSION: 1. No acute osseous abnormality. 2. Right proximal vastus lateralis muscle strain. 3. Right anterior superior labral tear. 4. Mild bilateral hip osteoarthritis. Electronically Signed   By: Obie Dredge M.D.   On: 03/05/2023 09:21   CT Hip Right Wo Contrast  Result Date: 03/03/2023 CLINICAL DATA:  Hip pain, stress fracture suspected. EXAM: CT OF THE RIGHT HIP WITHOUT CONTRAST TECHNIQUE: Multidetector CT imaging of the right hip was performed according to the standard protocol. Multiplanar CT image reconstructions were also generated. RADIATION DOSE REDUCTION: This exam was performed according to the departmental dose-optimization program which includes automated exposure control, adjustment of the mA and/or kV according to patient size and/or use of iterative reconstruction technique. COMPARISON:  Radiograph 03/03/2023 and CT abdomen and pelvis 01/26/2022 FINDINGS: Bones/Joint/Cartilage No acute fracture or dislocation. Mild right hip degenerative arthritis. Degenerative changes at the pubic symphysis. Ligaments Suboptimally assessed by CT. Muscles and Tendons No acute abnormality. Soft tissues Soft tissue thickening/edema and coarse calcifications within the posteromedial thigh/gluteal soft tissues. Gas within the bladder. IMPRESSION: 1. No acute fracture or dislocation. 2. Gas within the bladder. Correlate for recent instrumentation. Electronically Signed    By: Minerva Fester M.D.   On: 03/03/2023 23:19   DG Hip Unilat W or Wo Pelvis 2-3 Views Right  Result Date: 03/03/2023 CLINICAL DATA:  Right hip pain EXAM: DG HIP (WITH OR WITHOUT PELVIS) 2-3V RIGHT COMPARISON:  None Available. FINDINGS: Hip joints and SI joints symmetric. No acute bony abnormality. Specifically, no fracture, subluxation, or dislocation. IMPRESSION: No acute bony abnormality. Electronically Signed   By: Charlett Nose M.D.   On: 03/03/2023 21:34   CT Head Wo Contrast  Result Date: 02/22/2023 CLINICAL DATA:  Fall, facial trauma EXAM: CT HEAD WITHOUT CONTRAST CT MAXILLOFACIAL WITHOUT CONTRAST TECHNIQUE: Multidetector CT imaging of the head and maxillofacial structures were performed using the standard protocol without intravenous contrast. Multiplanar CT image reconstructions of the maxillofacial structures were also generated. RADIATION DOSE REDUCTION: This exam was performed according to the departmental dose-optimization program which includes automated exposure  control, adjustment of the mA and/or kV according to patient size and/or use of iterative reconstruction technique. COMPARISON:  CT head dated 11/05/2022 FINDINGS: CT HEAD FINDINGS Brain: No evidence of acute infarction, hemorrhage, hydrocephalus, extra-axial collection or mass lesion/mass effect. Vascular: Intracranial atherosclerosis. Skull: Normal. Negative for fracture or focal lesion. Other: None. CT MAXILLOFACIAL FINDINGS Osseous: No evidence of mass facial fracture. Nasal bones are intact. Mandible is intact. Bilateral mandibular condyles are well-seated in the TMJs. Orbits: Bilateral orbits, including the globes and retroconal soft tissues, within normal limits. Sinuses: The visualized paranasal sinuses are essentially clear. The mastoid air cells are unopacified. Soft tissues: Negative. IMPRESSION: Normal head CT. Normal maxillofacial CT. Electronically Signed   By: Charline Bills M.D.   On: 02/22/2023 00:26   CT  Maxillofacial WO CM  Result Date: 02/22/2023 CLINICAL DATA:  Fall, facial trauma EXAM: CT HEAD WITHOUT CONTRAST CT MAXILLOFACIAL WITHOUT CONTRAST TECHNIQUE: Multidetector CT imaging of the head and maxillofacial structures were performed using the standard protocol without intravenous contrast. Multiplanar CT image reconstructions of the maxillofacial structures were also generated. RADIATION DOSE REDUCTION: This exam was performed according to the departmental dose-optimization program which includes automated exposure control, adjustment of the mA and/or kV according to patient size and/or use of iterative reconstruction technique. COMPARISON:  CT head dated 11/05/2022 FINDINGS: CT HEAD FINDINGS Brain: No evidence of acute infarction, hemorrhage, hydrocephalus, extra-axial collection or mass lesion/mass effect. Vascular: Intracranial atherosclerosis. Skull: Normal. Negative for fracture or focal lesion. Other: None. CT MAXILLOFACIAL FINDINGS Osseous: No evidence of mass facial fracture. Nasal bones are intact. Mandible is intact. Bilateral mandibular condyles are well-seated in the TMJs. Orbits: Bilateral orbits, including the globes and retroconal soft tissues, within normal limits. Sinuses: The visualized paranasal sinuses are essentially clear. The mastoid air cells are unopacified. Soft tissues: Negative. IMPRESSION: Normal head CT. Normal maxillofacial CT. Electronically Signed   By: Charline Bills M.D.   On: 02/22/2023 00:26   DG Knee Complete 4 Views Left  Result Date: 02/22/2023 CLINICAL DATA:  Recent fall with left knee pain, initial encounter EXAM: LEFT KNEE - COMPLETE 4+ VIEW COMPARISON:  None Available. FINDINGS: There is an incomplete fracture through the midportion of the patella. No separation of the fracture fragments is noted. No other fracture is seen. No soft tissue abnormality is noted. IMPRESSION: Incomplete patellar fracture as described. Electronically Signed   By: Alcide Clever M.D.    On: 02/22/2023 00:22      Subjective: Patient seen and examined at bedside today.  Hemodynamically stable.  Very comfortable today.  No significant pain in the right hip.  Feels ready to go home.  Long discussion at the bedside about discharge planning  Discharge Exam: Vitals:   03/07/23 0406 03/07/23 0742  BP: (!) 175/59 (!) 173/64  Pulse: 65 (!) 58  Resp: 18 16  Temp: 98.1 F (36.7 C) 97.9 F (36.6 C)  SpO2: 92% 93%   Vitals:   03/06/23 1723 03/06/23 2017 03/07/23 0406 03/07/23 0742  BP: (!) 154/61 (!) 175/60 (!) 175/59 (!) 173/64  Pulse: (!) 59 70 65 (!) 58  Resp: 16 18 18 16   Temp: 98.4 F (36.9 C) 98.2 F (36.8 C) 98.1 F (36.7 C) 97.9 F (36.6 C)  TempSrc: Oral Oral Oral Oral  SpO2: 96% 95% 92% 93%  Weight:      Height:        General: Pt is alert, awake, not in acute distress Cardiovascular: RRR, S1/S2 +, no rubs, no gallops  Respiratory: CTA bilaterally, no wheezing, no rhonchi Abdominal: Soft, NT, ND, bowel sounds + Extremities: no edema, no cyanosis    The results of significant diagnostics from this hospitalization (including imaging, microbiology, ancillary and laboratory) are listed below for reference.     Microbiology: Recent Results (from the past 240 hour(s))  Urine Culture (for pregnant, neutropenic or urologic patients or patients with an indwelling urinary catheter)     Status: Abnormal   Collection Time: 03/04/23 10:20 AM   Specimen: Urine, Clean Catch  Result Value Ref Range Status   Specimen Description URINE, CLEAN CATCH  Final   Special Requests NONE  Final   Culture (A)  Final    <10,000 COLONIES/mL INSIGNIFICANT GROWTH Performed at [facility], [address] [place], [place] [address]    Report Status 03/05/2023 FINAL  Final  Anaerobic culture w Gram Stain     Status: None (Preliminary result)   Collection Time: 03/05/23  2:49 PM   Specimen: Joint, Right Hip; Synovial Fluid  Result Value Ref Range Status   Specimen  Description SYNOVIAL  Final   Special Requests RIGHT HIP  Final   Gram Stain   Final    FEW WBC PRESENT,BOTH PMN AND MONONUCLEAR NO ORGANISMS SEEN    Culture   Final    NO GROWTH 2 DAYS Performed at [facility], [address] [place], [place] [address]    Report Status PENDING  Incomplete  Body fluid culture w Gram Stain     Status: None (Preliminary result)   Collection Time: 03/05/23  2:49 PM   Specimen: Joint, Right Hip; Synovial Fluid  Result Value Ref Range Status   Specimen Description SYNOVIAL  Final   Special Requests RIGHT HIP  Final   Gram Stain   Final    FEW WBC PRESENT, PREDOMINANTLY PMN NO ORGANISMS SEEN    Culture   Final    NO GROWTH < 24 HOURS Performed at [facility], [address], [place], [place] [address]    Report Status PENDING  Incomplete     Labs: BNP (last 3 results) No results for input(s): "BNP" in the last 8760 hours. Basic Metabolic Panel: Recent Labs  Lab 03/04/23 0014 03/04/23 1028 03/05/23 0148 03/07/23 0416  NA 129* 137 134* 131*  K 4.2 4.1 4.3 4.1  CL 98 101 105 101  CO2 23 23 21* 16*  GLUCOSE 400* 81 135* 305*  BUN 18 17 20 21   CREATININE 1.25* 1.30* 1.36* 1.07*  CALCIUM 8.2* 8.7* 7.9* 8.4*   Liver Function Tests: No results for input(s): "AST", "ALT", "ALKPHOS", "BILITOT", "PROT", "ALBUMIN" in the last 168 hours. No results for input(s): "LIPASE", "AMYLASE" in the last 168 hours. No results for input(s): "AMMONIA" in the last 168 hours. CBC: Recent Labs  Lab 03/04/23 0024 03/07/23 0416  WBC 10.2 6.0  NEUTROABS 8.1*  --   HGB 11.9* 10.5*  HCT 36.3 31.9*  MCV 89.2 90.9  PLT 237 205   Cardiac Enzymes: No results for input(s): "CKTOTAL", "CKMB", "CKMBINDEX", "TROPONINI" in the last 168 hours. BNP: Invalid input(s): "POCBNP" CBG: Recent Labs  Lab 03/06/23 0749 03/06/23 1129 03/06/23 1725 03/06/23 1956 03/07/23 0746  GLUCAP 181* 213* 101* 248* 220*   D-Dimer No results for input(s): "DDIMER"  in the last 72 hours. Hgb A1c No results for input(s): "HGBA1C" in the last 72 hours. Lipid Profile No results for input(s): "CHOL", "HDL", "LDLCALC", "TRIG", "CHOLHDL", "LDLDIRECT" in the last 72  hours. Thyroid function studies No results for input(s): "TSH", "T4TOTAL", "T3FREE", "THYROIDAB" in the last 72 hours.  Invalid input(s): "FREET3" Anemia work up No results for input(s): "VITAMINB12", "FOLATE", "FERRITIN", "TIBC", "IRON", "RETICCTPCT" in the last 72 hours. Urinalysis    Component Value Date/Time   COLORURINE YELLOW 03/03/2023 0046   APPEARANCEUR HAZY (A) 03/03/2023 0046   LABSPEC 1.010 03/03/2023 0046   PHURINE 5.5 03/03/2023 0046   GLUCOSEU >=500 (A) 03/03/2023 0046   HGBUR NEGATIVE 03/03/2023 0046   BILIRUBINUR NEGATIVE 03/03/2023 0046   KETONESUR NEGATIVE 03/03/2023 0046   PROTEINUR NEGATIVE 03/03/2023 0046   UROBILINOGEN 1.0 12/29/2012 1628   NITRITE POSITIVE (A) 03/03/2023 0046   LEUKOCYTESUR TRACE (A) 03/03/2023 0046   Sepsis Labs Recent Labs  Lab 03/04/23 0024 03/07/23 0416  WBC 10.2 6.0   Microbiology Recent Results (from the past 240 hour(s))  Urine Culture (for pregnant, neutropenic or urologic patients or patients with an indwelling urinary catheter)     Status: Abnormal   Collection Time: 03/04/23 10:20 AM   Specimen: Urine, Clean Catch  Result Value Ref Range Status   Specimen Description URINE, CLEAN CATCH  Final   Special Requests NONE  Final   Culture (A)  Final    <10,000 COLONIES/mL INSIGNIFICANT GROWTH Performed at [facility], [address], [place], [place] [address]    Report Status 03/05/2023 FINAL  Final  Anaerobic culture w Gram Stain     Status: None (Preliminary result)   Collection Time: 03/05/23  2:49 PM   Specimen: Joint, Right Hip; Synovial Fluid  Result Value Ref Range Status   Specimen Description SYNOVIAL  Final   Special Requests RIGHT HIP  Final   Gram Stain   Final    FEW WBC PRESENT,BOTH PMN AND  MONONUCLEAR NO ORGANISMS SEEN    Culture   Final    NO GROWTH 2 DAYS Performed at [facility], [address] [place], [place] [address]    Report Status PENDING  Incomplete  Body fluid culture w Gram Stain     Status: None (Preliminary result)   Collection Time: 03/05/23  2:49 PM   Specimen: Joint, Right Hip; Synovial Fluid  Result Value Ref Range Status   Specimen Description SYNOVIAL  Final   Special Requests RIGHT HIP  Final   Gram Stain   Final    FEW WBC PRESENT, PREDOMINANTLY PMN NO ORGANISMS SEEN    Culture   Final    NO GROWTH < 24 HOURS Performed at [facility], [address] [place], [place] [address]    Report Status PENDING  Incomplete    Please note: You were cared for by a hospitalist during your hospital stay. Once you are discharged, your primary care physician will handle any further medical issues. Please note that NO REFILLS for any discharge medications will be authorized once you are discharged, as it is imperative that you return to your primary care physician (or establish a relationship with a primary care physician if you do not have one) for your post hospital discharge needs so that they can reassess your need for medications and monitor your lab values.    Time coordinating discharge: 40 minutes  SIGNED:   Burnadette Pop, MD  [facility] 03/07/2023, 10:24 AM Pager [phone]  If 7PM-7AM, please contact night-coverage [email or web address] Password [email or web address]

## 2023-03-07 NOTE — Progress Notes (Signed)
Orthopaedic Trauma Service Progress Note  Patient ID: Michelle Nicholson MRN: [id number] DOB/AGE: 1961/04/10 62 y.o.  Subjective:  Feeling better Started prednisone taper last night and she feels it is helping  Appreciate eval by vascular team this am  Vascular studies yesterday show R LEx arterial disease She will follow up with vascular in [place]   Right hip synovial fluid cultures show NGTD Remains afebrile and is clinically improving    ROS As above  Objective:   VITALS:   Vitals:   03/06/23 1723 03/06/23 2017 03/07/23 0406 03/07/23 0742  BP: (!) 154/61 (!) 175/60 (!) 175/59 (!) 173/64  Pulse: (!) 59 70 65 (!) 58  Resp: 16 18 18 16   Temp: 98.4 F (36.9 C) 98.2 F (36.8 C) 98.1 F (36.7 C) 97.9 F (36.6 C)  TempSrc: Oral Oral Oral Oral  SpO2: 96% 95% 92% 93%  Weight:      Height:        Estimated body mass index is 22.57 kg/m as calculated from the following:   Height as of this encounter: 5\' 9"  (1.753 m).   Weight as of this encounter: 69.3 kg.   Intake/Output      08/23 0701 08/24 0700 08/24 0701 08/25 0700   P.O. 840    Total Intake(mL/kg) 840 (12.1)    Net +840         Urine Occurrence 4 x      LABS  Results for orders placed or performed during the hospital encounter of 03/03/23 (from the past 24 hour(s))  Glucose, capillary     Status: Abnormal   Collection Time: 03/06/23 11:29 AM  Result Value Ref Range   Glucose-Capillary 213 (H) 70 - 99 mg/dL  Glucose, capillary     Status: Abnormal   Collection Time: 03/06/23  5:25 PM  Result Value Ref Range   Glucose-Capillary 101 (H) 70 - 99 mg/dL  Glucose, capillary     Status: Abnormal   Collection Time: 03/06/23  7:56 PM  Result Value Ref Range   Glucose-Capillary 248 (H) 70 - 99 mg/dL  CBC     Status: Abnormal   Collection Time: 03/07/23  4:16 AM  Result Value Ref Range   WBC 6.0 4.0 - 10.5 K/uL   RBC 3.51 (L) 3.87 -  5.11 MIL/uL   Hemoglobin 10.5 (L) 12.0 - 15.0 g/dL   HCT 44.0 (L) 10.2 - 72.5 %   MCV 90.9 80.0 - 100.0 fL   MCH 29.9 26.0 - 34.0 pg   MCHC 32.9 30.0 - 36.0 g/dL   RDW 36.6 44.0 - 34.7 %   Platelets 205 150 - 400 K/uL   nRBC 0.0 0.0 - 0.2 %  Basic metabolic panel     Status: Abnormal   Collection Time: 03/07/23  4:16 AM  Result Value Ref Range   Sodium 131 (L) 135 - 145 mmol/L   Potassium 4.1 3.5 - 5.1 mmol/L   Chloride 101 98 - 111 mmol/L   CO2 16 (L) 22 - 32 mmol/L   Glucose, Bld 305 (H) 70 - 99 mg/dL   BUN 21 8 - 23 mg/dL   Creatinine, Ser 4.25 (H) 0.44 - 1.00 mg/dL   Calcium 8.4 (L) 8.9 - 10.3 mg/dL   GFR, Estimated 58 (L) >60 mL/min   Anion gap  14 5 - 15  Glucose, capillary     Status: Abnormal   Collection Time: 03/07/23  7:46 AM  Result Value Ref Range   Glucose-Capillary 220 (H) 70 - 99 mg/dL     PHYSICAL EXAM:   Gen: resting comfortably, sitting up in chair, NAD Ext:       Right Lower Extremity              Improved motion right hip             No pain with axial loading of hip              Exam otherwise unchanged  Assessment/Plan:     Principal Problem:   Right hip pain Active Problems:   Uncontrolled type 2 diabetes mellitus with hyperglycemia, with long-term current use of insulin (HCC)   Essential hypertension   Recurrent UTI   Dyslipidemia   Carotid artery stenosis   Pain in right hip   Arthritis of right hip, degenerative labral tear   Labral tear of right hip joint   Anti-infectives (From admission, onward)    Start     Dose/Rate Route Frequency Ordered Stop   03/04/23 0130  cefTRIAXone (ROCEPHIN) 1 g in sodium chloride 0.9 % 100 mL IVPB        1 g 200 mL/hr over 30 Minutes Intravenous  Once 03/04/23 0129 03/04/23 0234     . 63 y/o female with acute R hip pain    -acute R hip pain with MRI findings of endstage DJD and labral tear, chronic Lumbar spine pathology                                                           Pt continues to  improve with addition of oral steroids                             Synovial fluid does not look infectious and is more consistent with inflammatory arthropathy. Continue steroid taper for 1 week                             Follow up on final cultures                            WBAT B LEx                         Therapies                         Heat and ice PRN   - R LEx PAD  Follow up with vascular    - Pain management:               Multimodal    - Medical issues                Per primary    - Dispo:               Dc today    Will need follow up with vascular, ortho spine (back) and general ortho (R hip and L knee)  Mearl Latin, PA-C [phone] (C) 03/07/2023, 10:38 AM  [facility] [address] [place] [address] [phone] Val [phone] (F)    After 5pm and on the weekends please log on to Amion, go to orthopaedics and the look under the [facility] Call for the provider(s) on call. You can also call our office at [phone] and then follow the prompts to be connected to the call team.  Patient ID: Michelle Nicholson, female   DOB: [birth year], 62 y.o.   MRN: [id number]

## 2023-03-07 NOTE — Progress Notes (Signed)
Patient discharged.  Tech removed PIV.  Reviewed discharge instructions, medications and follow up appts with patient.  Answered questions.  Gave patient copy of discharge instructions.    Prescription was filled by [facility] pharmacy.    No additional questions or concerns at this time.  Tech wheeled patient to [facility] pharmacy and out to car.

## 2023-03-08 LAB — BODY FLUID CULTURE W GRAM STAIN: Culture: NO GROWTH

## 2023-03-10 LAB — ANAEROBIC CULTURE W GRAM STAIN

## 2023-03-24 ENCOUNTER — Telehealth: Payer: Self-pay

## 2023-03-24 NOTE — Telephone Encounter (Signed)
RN from [facility] called for ok to stop Plavix for a wrist fx surgery scheduled for 9/12.  Spoke with PA and MD, who agreed that stopping her Plavix for the surgery was ok with regard to her previous CEA and continued monitoring. However, her PAD is followed by [facility] and Dr. Elonda Husky is the original prescriber of Plavix. Asked her to call that office to get confirmation for stopping it for the ortho surgery. Confirmed understanding.

## 2023-12-14 ENCOUNTER — Other Ambulatory Visit: Payer: Self-pay | Admitting: Vascular Surgery

## 2023-12-14 DIAGNOSIS — I6522 Occlusion and stenosis of left carotid artery: Secondary | ICD-10-CM

## 2023-12-21 ENCOUNTER — Other Ambulatory Visit: Payer: Self-pay

## 2023-12-21 DIAGNOSIS — I6522 Occlusion and stenosis of left carotid artery: Secondary | ICD-10-CM

## 2023-12-24 ENCOUNTER — Other Ambulatory Visit: Payer: Self-pay

## 2023-12-24 DIAGNOSIS — I6522 Occlusion and stenosis of left carotid artery: Secondary | ICD-10-CM

## 2023-12-24 MED ORDER — CLOPIDOGREL BISULFATE 75 MG PO TABS
75.0000 mg | ORAL_TABLET | Freq: Every day | ORAL | 0 refills | Status: DC
Start: 2023-12-24 — End: 2024-05-13

## 2024-01-07 ENCOUNTER — Encounter (HOSPITAL_COMMUNITY): Payer: Self-pay

## 2024-01-07 ENCOUNTER — Ambulatory Visit: Payer: Self-pay | Admitting: Vascular Surgery

## 2024-01-23 ENCOUNTER — Emergency Department (HOSPITAL_BASED_OUTPATIENT_CLINIC_OR_DEPARTMENT_OTHER)
Admission: EM | Admit: 2024-01-23 | Discharge: 2024-01-23 | Disposition: A | Discharge status: Home / Self Care | Attending: Emergency Medicine | Admitting: Emergency Medicine

## 2024-01-23 ENCOUNTER — Other Ambulatory Visit: Payer: Self-pay

## 2024-01-23 ENCOUNTER — Emergency Department (HOSPITAL_BASED_OUTPATIENT_CLINIC_OR_DEPARTMENT_OTHER)

## 2024-01-23 ENCOUNTER — Encounter (HOSPITAL_BASED_OUTPATIENT_CLINIC_OR_DEPARTMENT_OTHER): Payer: Self-pay | Admitting: Emergency Medicine

## 2024-01-23 DIAGNOSIS — M79604 Pain in right leg: Secondary | ICD-10-CM | POA: Insufficient documentation

## 2024-01-23 DIAGNOSIS — M7989 Other specified soft tissue disorders: Secondary | ICD-10-CM | POA: Insufficient documentation

## 2024-01-23 DIAGNOSIS — Z794 Long term (current) use of insulin: Secondary | ICD-10-CM | POA: Insufficient documentation

## 2024-01-23 DIAGNOSIS — M79662 Pain in left lower leg: Secondary | ICD-10-CM | POA: Insufficient documentation

## 2024-01-23 DIAGNOSIS — Z79899 Other long term (current) drug therapy: Secondary | ICD-10-CM | POA: Diagnosis not present

## 2024-01-23 DIAGNOSIS — I10 Essential (primary) hypertension: Secondary | ICD-10-CM | POA: Insufficient documentation

## 2024-01-23 DIAGNOSIS — R7309 Other abnormal glucose: Secondary | ICD-10-CM

## 2024-01-23 DIAGNOSIS — Z7982 Long term (current) use of aspirin: Secondary | ICD-10-CM | POA: Insufficient documentation

## 2024-01-23 DIAGNOSIS — Z7902 Long term (current) use of antithrombotics/antiplatelets: Secondary | ICD-10-CM | POA: Insufficient documentation

## 2024-01-23 DIAGNOSIS — E119 Type 2 diabetes mellitus without complications: Secondary | ICD-10-CM | POA: Insufficient documentation

## 2024-01-23 LAB — CBC WITH DIFFERENTIAL/PLATELET
Abs Immature Granulocytes: 0.04 K/uL (ref 0.00–0.07)
Basophils Absolute: 0.1 K/uL (ref 0.0–0.1)
Basophils Relative: 1 %
Eosinophils Absolute: 0.4 K/uL (ref 0.0–0.5)
Eosinophils Relative: 5 %
HCT: 31.4 % — ABNORMAL LOW (ref 36.0–46.0)
Hemoglobin: 10.6 g/dL — ABNORMAL LOW (ref 12.0–15.0)
Immature Granulocytes: 1 %
Lymphocytes Relative: 31 %
Lymphs Abs: 2.3 K/uL (ref 0.7–4.0)
MCH: 30 pg (ref 26.0–34.0)
MCHC: 33.8 g/dL (ref 30.0–36.0)
MCV: 89 fL (ref 80.0–100.0)
Monocytes Absolute: 0.7 K/uL (ref 0.1–1.0)
Monocytes Relative: 9 %
Neutro Abs: 4.1 K/uL (ref 1.7–7.7)
Neutrophils Relative %: 53 %
Platelets: 273 K/uL (ref 150–400)
RBC: 3.53 MIL/uL — ABNORMAL LOW (ref 3.87–5.11)
RDW: 13.6 % (ref 11.5–15.5)
WBC: 7.6 K/uL (ref 4.0–10.5)
nRBC: 0 % (ref 0.0–0.2)

## 2024-01-23 LAB — COMPREHENSIVE METABOLIC PANEL WITH GFR
ALT: 30 U/L (ref 0–44)
AST: 28 U/L (ref 15–41)
Albumin: 3.8 g/dL (ref 3.5–5.0)
Alkaline Phosphatase: 118 U/L (ref 38–126)
Anion gap: 15 (ref 5–15)
BUN: 16 mg/dL (ref 8–23)
CO2: 19 mmol/L — ABNORMAL LOW (ref 22–32)
Calcium: 8.5 mg/dL — ABNORMAL LOW (ref 8.9–10.3)
Chloride: 98 mmol/L (ref 98–111)
Creatinine, Ser: 1.12 mg/dL — ABNORMAL HIGH (ref 0.44–1.00)
GFR, Estimated: 55 mL/min — ABNORMAL LOW (ref >60)
Glucose, Bld: 212 mg/dL — ABNORMAL HIGH (ref 70–99)
Potassium: 3.8 mmol/L (ref 3.5–5.1)
Sodium: 132 mmol/L — ABNORMAL LOW (ref 135–145)
Total Bilirubin: <0.2 mg/dL (ref 0.0–1.2)
Total Protein: 6.4 g/dL — ABNORMAL LOW (ref 6.5–8.1)

## 2024-01-23 LAB — CBG MONITORING, ED: Glucose-Capillary: 207 mg/dL — ABNORMAL HIGH (ref 70–99)

## 2024-01-23 NOTE — ED Provider Notes (Incomplete)
 [facility] Provider Note   CSN: [id number] Arrival date & time: 01/23/24  1532     Patient presents with: Multiple Complaints   Michelle Nicholson is a 63 y.o. female with past medical history of insulin -dependent T2DM, HTN, Mnire syndrome, sepsis, chronic hyponatremia, CVA (on Plavix ), carotid artery stenosis, HLD, tobacco use presents to emergency department for evaluation of hypoglycemia, hypertension, lower extremity swelling and pain.  Reports that systolic blood pressure typically ranges 170-200 over the past couple months.  Her PCP has changed her from valsartan to amlodipine  this month.  She is increased from 2.5-10mg  over this past month.  Currently on amlodipine  10 mg daily.    Also complains that sugars have been dipping into 60s requiring her to eat since increasing NovoLog  from 4 units 3 times daily to 10 units 3 times daily and increasing long-acting insulin  from 18 to 30 units daily on 01/21/2024.  Also uses sliding scale  Today, ultimately, sought ED evaluation for BLE swelling, pain, numbness. This morning, woke up with BLE swelling, pain then numbness.  Denies recent travel, history of blood clots, recent surgeries, recent travel.   {Add pertinent medical, surgical, social history, OB history to HPI:32947} HPI     Prior to Admission medications   Medication Sig Start Date End Date Taking? Authorizing Provider  acetaminophen  (TYLENOL ) 500 MG tablet Take 1,000 mg by mouth daily as needed for headache (pain). Patient not taking: Reported on 03/06/2023    [provider]  albuterol  (VENTOLIN  HFA) 108 (90 Base) MCG/ACT inhaler Inhale 1-2 puffs into the lungs every 6 (six) hours as needed for wheezing or shortness of breath.    [provider]  aspirin  325 MG tablet Take 1 tablet (325 mg total) by mouth daily. 11/30/21   Danford, Lonni SQUIBB, MD  atorvastatin  (LIPITOR ) 80 MG tablet Take 80 mg by mouth in the morning.     [provider]  buPROPion  (WELLBUTRIN  XL) 150 MG 24 hr tablet Take 150 mg by mouth every morning. 03/05/21   [provider]  Calcium -Magnesium -Zinc (CAL-MAG-ZINC PO) Take 1 tablet by mouth every morning.    [provider]  clopidogrel  (PLAVIX ) 75 MG tablet Take 1 tablet (75 mg total) by mouth daily. 12/24/23   Robins, Joshua E, MD  estradiol (ESTRACE) 0.1 MG/GM vaginal cream Place 1 Applicatorful vaginally at bedtime.    [provider]  ferrous sulfate  325 (65 FE) MG tablet Take 1 tablet (325 mg total) by mouth 2 (two) times daily with a meal. Patient taking differently: Take 325 mg by mouth every morning. 04/25/21 03/04/23  Christobal Guadalajara, MD  fluorouracil  (EFUDEX ) 5 % cream Apply 1 application. topically daily as needed (skin cancer breakouts).    [provider]  fluticasone-salmeterol (ADVAIR) 100-50 MCG/ACT AEPB Inhale 1 puff into the lungs daily as needed (shortness of breath).    [provider]  folic acid  (FOLVITE ) 1 MG tablet Take 1 mg by mouth every morning.    [provider]  GEMTESA 75 MG TABS Take 1 tablet by mouth daily.    [provider]  HYDROcodone -acetaminophen  (NORCO/VICODIN) 5-325 MG tablet Take 1 tablet by mouth every 6 (six) hours as needed for severe pain. 02/22/23   Roselyn Carlin NOVAK, MD  insulin  aspart (NOVOLOG ) 100 UNIT/ML FlexPen Inject 4 Units into the skin See admin instructions. Inject 4 units subcutaneously three times daily after meals - plus sliding scale adjustment - add 1 unit for every  50 units over 200    [provider]  Insulin  Degludec (TRESIBA  FLEXTOUCH) 100 UNIT/ML SOPN Inject 7 Units into the skin daily. Patient taking differently: Inject 18 Units into the skin daily after breakfast. P 12/10/15   Samtani, Jai-Gurmukh, MD  latanoprost  (XALATAN ) 0.005 % ophthalmic solution Place 1 drop into both eyes at bedtime. 04/10/21   [provider]  levothyroxine  (SYNTHROID ) 50 MCG  tablet Take 50 mcg by mouth daily before breakfast.    [provider]  losartan  (COZAAR ) 100 MG tablet Take 100 mg by mouth daily.    [provider]  meloxicam (MOBIC) 15 MG tablet Take 15 mg by mouth daily.    [provider]  methazolamide  (NEPTAZANE ) 50 MG tablet Take 50 mg by mouth 2 (two) times daily.    [provider]  Multiple Vitamin (MULTIVITAMIN WITH MINERALS) TABS tablet Take 1 tablet by mouth every morning.    [provider]  Pancrelipase , Lip-Prot-Amyl, 24000-76000 units CPEP Take 2 capsules by mouth daily after breakfast.    [provider]  predniSONE  (DELTASONE ) 10 MG tablet Take 4 tablets by mouth daily for 2 days then 2 tablets daily for 2 days then 1 tablet  daily for 2 days then stop 03/07/23   Jillian Buttery, MD  pregabalin  (LYRICA ) 300 MG capsule Take 300 mg by mouth 2 (two) times daily. 02/05/21   [provider]  sertraline  (ZOLOFT ) 100 MG tablet Take 100 mg by mouth every morning.    [provider]  sodium chloride  1 g tablet Take 1 g by mouth 3 (three) times daily as needed (brain fog from sodium insufficiency). 12/24/20   [provider]  Vitamin D, Ergocalciferol, (DRISDOL) 1.25 MG (50000 UNIT) CAPS capsule Take 50,000 Units by mouth every Sunday. 01/28/21   [provider]    Allergies: Dorzolamide hcl-timolol mal, Brimonidine, and Netarsudil-latanoprost     Review of Systems  Updated Vital Signs BP (!) 209/69   Pulse 69   Temp 97.9 F (36.6 C) (Oral)   Resp 11   Ht 5' 7.5 (1.715 m)   Wt 68.5 kg   SpO2 99%   BMI 23.30 kg/m   Physical Exam  (all labs ordered are listed, but only abnormal results are displayed) Labs Reviewed  CBC WITH DIFFERENTIAL/PLATELET - Abnormal; Notable for the following components:      Result Value   RBC 3.53 (*)    Hemoglobin 10.6 (*)    HCT 31.4 (*)    All other components within normal limits  COMPREHENSIVE METABOLIC PANEL WITH GFR -  Abnormal; Notable for the following components:   Sodium 132 (*)    CO2 19 (*)    Glucose, Bld 212 (*)    Creatinine, Ser 1.12 (*)    Calcium  8.5 (*)    Total Protein 6.4 (*)    GFR, Estimated 55 (*)    All other components within normal limits  CBG MONITORING, ED - Abnormal; Notable for the following components:   Glucose-Capillary 207 (*)    All other components within normal limits  CBG MONITORING, ED    EKG: None  Radiology: US  Venous Img Lower Bilateral (DVT) Result Date: 01/23/2024 CLINICAL DATA:  Bilateral lower extremity swelling EXAM: BILATERAL LOWER EXTREMITY VENOUS DOPPLER ULTRASOUND TECHNIQUE: Gray-scale sonography with compression, as well as color and duplex ultrasound, were performed to evaluate the deep venous system(s) from the level of the common femoral vein through the popliteal and proximal calf veins. COMPARISON:  None Available. FINDINGS: VENOUS Normal compressibility of the common femoral, superficial femoral, and popliteal veins, as well as the visualized calf veins. Visualized portions of profunda femoral vein and great saphenous vein unremarkable. No filling defects to suggest DVT on grayscale or color Doppler imaging. Doppler waveforms show normal direction of venous flow, normal respiratory plasticity and response to augmentation. OTHER None. Limitations: none IMPRESSION: Negative. Electronically Signed   By: Dorethia Molt M.D.   On: 01/23/2024 19:51    {Document cardiac monitor, telemetry assessment procedure when appropriate:32947} Procedures   Medications Ordered in the ED - No data to display    {Click here for ABCD2, HEART and other calculators REFRESH Note before signing:1}                              Medical Decision Making Amount and/or Complexity of Data Reviewed Labs: ordered.   Patient presents to the ED for concern of pedal edema, HTN, hypoglycemia, this involves an extensive number of treatment options, and is a complaint that carries  with it a high risk of complications and morbidity.  The differential diagnosis includes hypertensive emergency, DVT, fluid overload, CCB related pedal edema, recent increase in insulin  causing hypoglycemia, poor oral intake, medication noncompliance, endorgan damage   Co morbidities that complicate the patient evaluation  See HPI   Additional history obtained:  Additional history obtained from Nursing and Outside Medical Records   External records from outside source obtained and reviewed including triage RN note   Lab Tests:  I Ordered, and personally interpreted labs.  The pertinent results include:   Sodium 132 CBG 212 Creatinine 1.21 Hgb 10.6   Imaging Studies ordered:  I ordered imaging studies including DVT ultrasound I independently visualized and interpreted imaging which showed *** I agree with the radiologist interpretation   Cardiac Monitoring:  The patient was maintained on a cardiac monitor.  I personally viewed and interpreted the cardiac monitored which showed an underlying rhythm of: ***   Medicines ordered and prescription drug management:  I ordered medication including ***  for ***  Reevaluation of the patient after these medicines showed that the patient {resolved/improved/worsened:23923::improved} I have reviewed the patients home medicines and have made adjustments as needed   Test Considered:  ***   Critical Interventions:  ***   Consultations Obtained:  I requested consultation with the ***,  and discussed lab and imaging findings as well as pertinent plan - they recommend: ***   Problem List / ED Course:  ***   Reevaluation:  After the interventions noted above, I reevaluated the patient and found that they have :improved   Social Determinants of Health:  Has pcp, vascular, endocrinology f/u   Dispostion:  After consideration of the diagnostic results and the patients response to treatment, I feel that the patent  would benefit from outpatient management with specialist f/u for specific concerns.   Discussed ED workup, disposition, return to ED precautions with patient who expresses understanding agrees with plan.  All questions answered to their satisfaction.  They are agreeable to plan.  Discharge instructions provided on paperwork  Final diagnoses:  None    ED Discharge Orders     None

## 2024-01-23 NOTE — Discharge Instructions (Addendum)
 Thank you for letting us  evaluate you today.  Urine ultrasound today did not show any blood clot in your legs bilaterally.  I think that your leg swelling was likely secondary to recent increase of amlodipine .  Please try and elevate legs throughout the day to stop swelling and contact your primary care provider regarding blood pressure management, possible amlodipine  side effect  Your sugar here was 207.  Please contact your endocrinologist about sugar control/insulin  regulation.  Return to emergency department if you experience chest pain, shortness of breath, severe swelling and pain of legs that is not improved with elevation, severe sudden onset headache, visual disturbances

## 2024-01-23 NOTE — ED Triage Notes (Signed)
 Pt recently had insulin  doses increased d/t hyperglycemia, now she is having some low blood sugars at home; was 76 just PTA, drank juice; reports increased BP lately and those meds have also been increased; woke this morning with LT lower leg swelling which has gone down some; both feet went numb this morning which has also improved some; pt ambulatory to triage

## 2024-01-23 NOTE — ED Provider Notes (Signed)
 [place] [facility] Provider Note   CSN: [id number] Arrival date & time: 01/23/24  1532     Patient presents with: Multiple Complaints   Michelle Nicholson is a 63 y.o. female.  Has been ranging 170-200 over past couple months. Recently increased amlodipine  4 pills at night (10mg ) - started Thursday. 7.5mg  a week before Woke up this morning with swollen left leg, numbness No cp, shob, no coughing   Insulin  10 units TID plus sliding scale increase since Thursday with endocrinology. Was 4 units TID  Increased slow acting for 18-30. Has been having lows of 60 requiring her to need t eat  {Add pertinent medical, surgical, social history, OB history to HPI:32947} HPI     Prior to Admission medications   Medication Sig Start Date End Date Taking? Authorizing Provider  acetaminophen  (TYLENOL ) 500 MG tablet Take 1,000 mg by mouth daily as needed for headache (pain). Patient not taking: Reported on 03/06/2023    [provider]  albuterol  (VENTOLIN  HFA) 108 (90 Base) MCG/ACT inhaler Inhale 1-2 puffs into the lungs every 6 (six) hours as needed for wheezing or shortness of breath.    [provider]  aspirin  325 MG tablet Take 1 tablet (325 mg total) by mouth daily. 11/30/21   Danford, Lonni SQUIBB, MD  atorvastatin  (LIPITOR ) 80 MG tablet Take 80 mg by mouth in the morning.    [provider]  buPROPion  (WELLBUTRIN  XL) 150 MG 24 hr tablet Take 150 mg by mouth every morning. 03/05/21   [provider]  Calcium -Magnesium -Zinc (CAL-MAG-ZINC PO) Take 1 tablet by mouth every morning.    [provider]  clopidogrel  (PLAVIX ) 75 MG tablet Take 1 tablet (75 mg total) by mouth daily. 12/24/23   Robins, Joshua E, MD  estradiol (ESTRACE) 0.1 MG/GM vaginal cream Place 1 Applicatorful vaginally at bedtime.    [provider]  ferrous sulfate  325 (65 FE) MG tablet Take 1 tablet (325 mg total) by mouth 2 (two) times daily  with a meal. Patient taking differently: Take 325 mg by mouth every morning. 04/25/21 03/04/23  Christobal Guadalajara, MD  fluorouracil  (EFUDEX ) 5 % cream Apply 1 application. topically daily as needed (skin cancer breakouts).    [provider]  fluticasone-salmeterol (ADVAIR) 100-50 MCG/ACT AEPB Inhale 1 puff into the lungs daily as needed (shortness of breath).    [provider]  folic acid  (FOLVITE ) 1 MG tablet Take 1 mg by mouth every morning.    [provider]  GEMTESA 75 MG TABS Take 1 tablet by mouth daily.    [provider]  HYDROcodone -acetaminophen  (NORCO/VICODIN) 5-325 MG tablet Take 1 tablet by mouth every 6 (six) hours as needed for severe pain. 02/22/23   Roselyn Carlin NOVAK, MD  insulin  aspart (NOVOLOG ) 100 UNIT/ML FlexPen Inject 4 Units into the skin See admin instructions. Inject 4 units subcutaneously three times daily after meals - plus sliding scale adjustment - add 1 unit for every 50 units over 200    [provider]  Insulin  Degludec (TRESIBA  FLEXTOUCH) 100 UNIT/ML SOPN Inject 7 Units into the skin daily. Patient taking differently: Inject 18 Units into the skin daily after breakfast. P 12/10/15   Samtani, Jai-Gurmukh, MD  latanoprost  (XALATAN ) 0.005 % ophthalmic solution Place 1 drop into both eyes at bedtime. 04/10/21   [provider]  levothyroxine  (SYNTHROID ) 50 MCG tablet Take 50 mcg by mouth daily before breakfast.    [provider]  losartan  (  COZAAR ) 100 MG tablet Take 100 mg by mouth daily.    [provider]  meloxicam (MOBIC) 15 MG tablet Take 15 mg by mouth daily.    [provider]  methazolamide  (NEPTAZANE ) 50 MG tablet Take 50 mg by mouth 2 (two) times daily.    [provider]  Multiple Vitamin (MULTIVITAMIN WITH MINERALS) TABS tablet Take 1 tablet by mouth every morning.    [provider]  Pancrelipase , Lip-Prot-Amyl, 24000-76000 units CPEP Take 2 capsules by mouth daily  after breakfast.    [provider]  predniSONE  (DELTASONE ) 10 MG tablet Take 4 tablets by mouth daily for 2 days then 2 tablets daily for 2 days then 1 tablet  daily for 2 days then stop 03/07/23   Jillian Buttery, MD  pregabalin  (LYRICA ) 300 MG capsule Take 300 mg by mouth 2 (two) times daily. 02/05/21   [provider]  sertraline  (ZOLOFT ) 100 MG tablet Take 100 mg by mouth every morning.    [provider]  sodium chloride  1 g tablet Take 1 g by mouth 3 (three) times daily as needed (brain fog from sodium insufficiency). 12/24/20   [provider]  Vitamin D, Ergocalciferol, (DRISDOL) 1.25 MG (50000 UNIT) CAPS capsule Take 50,000 Units by mouth every Sunday. 01/28/21   [provider]    Allergies: Dorzolamide hcl-timolol mal, Brimonidine, and Netarsudil-latanoprost     Review of Systems  Updated Vital Signs BP (!) 209/69   Pulse 69   Temp 97.9 F (36.6 C) (Oral)   Resp 11   Ht 5' 7.5 (1.715 m)   Wt 68.5 kg   SpO2 99%   BMI 23.30 kg/m   Physical Exam  (all labs ordered are listed, but only abnormal results are displayed) Labs Reviewed  CBC WITH DIFFERENTIAL/PLATELET - Abnormal; Notable for the following components:      Result Value   RBC 3.53 (*)    Hemoglobin 10.6 (*)    HCT 31.4 (*)    All other components within normal limits  COMPREHENSIVE METABOLIC PANEL WITH GFR - Abnormal; Notable for the following components:   Sodium 132 (*)    CO2 19 (*)    Glucose, Bld 212 (*)    Creatinine, Ser 1.12 (*)    Calcium  8.5 (*)    Total Protein 6.4 (*)    GFR, Estimated 55 (*)    All other components within normal limits  CBG MONITORING, ED - Abnormal; Notable for the following components:   Glucose-Capillary 207 (*)    All other components within normal limits  CBG MONITORING, ED    EKG: None  Radiology: US  Venous Img Lower Bilateral (DVT) Result Date: 01/23/2024 CLINICAL DATA:  Bilateral lower extremity swelling EXAM:  BILATERAL LOWER EXTREMITY VENOUS DOPPLER ULTRASOUND TECHNIQUE: Gray-scale sonography with compression, as well as color and duplex ultrasound, were performed to evaluate the deep venous system(s) from the level of the common femoral vein through the popliteal and proximal calf veins. COMPARISON:  None Available. FINDINGS: VENOUS Normal compressibility of the common femoral, superficial femoral, and popliteal veins, as well as the visualized calf veins. Visualized portions of profunda femoral vein and great saphenous vein unremarkable. No filling defects to suggest DVT on grayscale or color Doppler imaging. Doppler waveforms show normal direction of venous flow, normal respiratory plasticity and response to augmentation. OTHER None. Limitations: none IMPRESSION: Negative. Electronically Signed   By: Dorethia Molt M.D.   On: 01/23/2024 19:51    {Document cardiac monitor, telemetry  assessment procedure when appropriate:32947} Procedures   Medications Ordered in the ED - No data to display    {Click here for ABCD2, HEART and other calculators REFRESH Note before signing:1}                              Medical Decision Making Amount and/or Complexity of Data Reviewed Labs: ordered.   ***  {Document critical care time when appropriate  Document review of labs and clinical decision tools ie CHADS2VASC2, etc  Document your independent review of radiology images and any outside records  Document your discussion with family members, caretakers and with consultants  Document social determinants of health affecting pt's care  Document your decision making why or why not admission, treatments were needed:32947:::1}   Final diagnoses:  None    ED Discharge Orders     None

## 2024-02-25 ENCOUNTER — Ambulatory Visit: Payer: Self-pay | Admitting: Vascular Surgery

## 2024-02-25 ENCOUNTER — Ambulatory Visit (HOSPITAL_COMMUNITY): Payer: Self-pay

## 2024-03-08 NOTE — Progress Notes (Signed)
 Orthopaedic Surgery Follow-Up Visit 03/08/2024  HPI: 63 year old female with right hip and right lower back pain who presents today with complaint of worsening symptoms.  Patient reports that she completed several weeks of physical therapy and had a little improvement, however states that her pain is severe today making it difficult for her to walk.  She states that she is going on a trip soon and would like to know if there is something we can give her for pain relief.  Patient reports pain radiates down to the right lower extremity to the knee.  She also reports weakness in the right lower extremity.  She has diabetes mellitus last hemoglobin A1c was 8.3.  She has been evaluated for her lumbar spine in 2023 from neurosurgery.  At that time she elected proceed with nonoperative management.  Reports no symptoms with coughing or straining.  Physical exam: Vitals:   03/08/24 1346  BP: (!) 194/67  Pulse: 67  SpO2: 99%    Appearance: healthy, no acute distress, well-groomed HEENT: EOMI, mucous membranes moist CV: RRR Pulm: breathing comfortably Lumbar spine: Nontender palpation along the right paraspinal and right SI joint with recreation of her symptoms radiating down the right lower extremity.  Full active range of motion with flexion of the lumbar spine and sidebending.  Decreased active range of motion with extension.  Increased symptoms radiating down the right lower extremity with flexion of the lumbar spine.  Sensations intact bilaterally L2-S1.  Strength right lower extremity L3 is 5\5 L4 4\5 L5 is 1+\5 S1 is 5\5.  Straight leg raise test is positive.  Left lower extremity strength L3-S1 is 5\5 straight leg raise is negative.  Imaging:  Radiology Results (last 72 hours)     ** No results found for the last 72 hours. **        Assessment/Plan: 1. Lumbar radiculopathy      2. Sciatica of right side  MRI Spine Lumbar WO Contrast     Reviewed clinical exam and at the present time  discussed with the patient concern for lumbar radiculopathy that has not responded to physical therapy at this time recommend  1) MRI of the lumbar spine evaluate for lumbar radiculopathy 2) Flexeril  5 mg every 8 hours as needed for pain 3) activity as tolerated 4) follow-up after MRI.   Lynwood SAUNDERS. Anitra, MD MPT 03/08/2024 1:47 PM

## 2024-03-17 ENCOUNTER — Telehealth: Payer: Self-pay

## 2024-03-17 NOTE — Telephone Encounter (Signed)
 Noted

## 2024-03-17 NOTE — Telephone Encounter (Signed)
 Pt called back to advise that the hospital is no longer discharging her today 03/17/24. Instead they are going to discharge her tomorrow morning 03/18/24. She was wondering if she needed to go to the [facility] ED or come in office to be seen by Dr. Crist team. After speaking with Autumn F, I advised to to give us  a call as soon as she is discharged so we are able to work her in for that day. She stated that the doctor at the hospital she is now advised her to get back on IV antibiotics as soon as possible after discharge to prevent the infection from spreading. I also advised that she may need to go back to ED depending on her examination. Please be on the lookout for her call.

## 2024-03-17 NOTE — Telephone Encounter (Signed)
 noted

## 2024-03-17 NOTE — Telephone Encounter (Signed)
 Just FYI.  This patient called stating she had gone to the ED in [facility] due to great toe pain and redness. They have given her antibiotics and told her that she has osteomyelitis and will most likely need an amputation  [place] but head to [facility] ED here. She doesn't want to have surgery there She is not a patient of ours but is requesting [facility] for this

## 2024-03-18 ENCOUNTER — Emergency Department (HOSPITAL_COMMUNITY)

## 2024-03-18 ENCOUNTER — Other Ambulatory Visit: Payer: Self-pay

## 2024-03-18 ENCOUNTER — Inpatient Hospital Stay (HOSPITAL_COMMUNITY)
Admission: EM | Admit: 2024-03-18 | Discharge: 2024-03-27 | DRG: 617 | Disposition: A | Discharge status: Home / Self Care | Attending: Family Medicine | Admitting: Family Medicine

## 2024-03-18 DIAGNOSIS — I70221 Atherosclerosis of native arteries of extremities with rest pain, right leg: Secondary | ICD-10-CM | POA: Diagnosis present

## 2024-03-18 DIAGNOSIS — N289 Disorder of kidney and ureter, unspecified: Secondary | ICD-10-CM

## 2024-03-18 DIAGNOSIS — Z7951 Long term (current) use of inhaled steroids: Secondary | ICD-10-CM

## 2024-03-18 DIAGNOSIS — M1611 Unilateral primary osteoarthritis, right hip: Secondary | ICD-10-CM | POA: Diagnosis present

## 2024-03-18 DIAGNOSIS — E1052 Type 1 diabetes mellitus with diabetic peripheral angiopathy with gangrene: Secondary | ICD-10-CM | POA: Diagnosis present

## 2024-03-18 DIAGNOSIS — I739 Peripheral vascular disease, unspecified: Secondary | ICD-10-CM

## 2024-03-18 DIAGNOSIS — F39 Unspecified mood [affective] disorder: Secondary | ICD-10-CM | POA: Diagnosis present

## 2024-03-18 DIAGNOSIS — Z888 Allergy status to other drugs, medicaments and biological substances status: Secondary | ICD-10-CM

## 2024-03-18 DIAGNOSIS — L089 Local infection of the skin and subcutaneous tissue, unspecified: Secondary | ICD-10-CM | POA: Diagnosis present

## 2024-03-18 DIAGNOSIS — Z7902 Long term (current) use of antithrombotics/antiplatelets: Secondary | ICD-10-CM

## 2024-03-18 DIAGNOSIS — Z8249 Family history of ischemic heart disease and other diseases of the circulatory system: Secondary | ICD-10-CM

## 2024-03-18 DIAGNOSIS — Z791 Long term (current) use of non-steroidal anti-inflammatories (NSAID): Secondary | ICD-10-CM

## 2024-03-18 DIAGNOSIS — Z7982 Long term (current) use of aspirin: Secondary | ICD-10-CM

## 2024-03-18 DIAGNOSIS — N179 Acute kidney failure, unspecified: Secondary | ICD-10-CM | POA: Diagnosis present

## 2024-03-18 DIAGNOSIS — Z833 Family history of diabetes mellitus: Secondary | ICD-10-CM

## 2024-03-18 DIAGNOSIS — K8689 Other specified diseases of pancreas: Secondary | ICD-10-CM | POA: Diagnosis present

## 2024-03-18 DIAGNOSIS — Z79890 Hormone replacement therapy (postmenopausal): Secondary | ICD-10-CM

## 2024-03-18 DIAGNOSIS — J449 Chronic obstructive pulmonary disease, unspecified: Secondary | ICD-10-CM | POA: Insufficient documentation

## 2024-03-18 DIAGNOSIS — E785 Hyperlipidemia, unspecified: Secondary | ICD-10-CM | POA: Diagnosis present

## 2024-03-18 DIAGNOSIS — I708 Atherosclerosis of other arteries: Secondary | ICD-10-CM | POA: Diagnosis present

## 2024-03-18 DIAGNOSIS — E1069 Type 1 diabetes mellitus with other specified complication: Secondary | ICD-10-CM | POA: Diagnosis not present

## 2024-03-18 DIAGNOSIS — E039 Hypothyroidism, unspecified: Secondary | ICD-10-CM | POA: Diagnosis present

## 2024-03-18 DIAGNOSIS — E1065 Type 1 diabetes mellitus with hyperglycemia: Secondary | ICD-10-CM | POA: Diagnosis present

## 2024-03-18 DIAGNOSIS — Z87891 Personal history of nicotine dependence: Secondary | ICD-10-CM

## 2024-03-18 DIAGNOSIS — H9191 Unspecified hearing loss, right ear: Secondary | ICD-10-CM | POA: Diagnosis present

## 2024-03-18 DIAGNOSIS — E1040 Type 1 diabetes mellitus with diabetic neuropathy, unspecified: Secondary | ICD-10-CM | POA: Diagnosis present

## 2024-03-18 DIAGNOSIS — Z8673 Personal history of transient ischemic attack (TIA), and cerebral infarction without residual deficits: Secondary | ICD-10-CM

## 2024-03-18 DIAGNOSIS — I70261 Atherosclerosis of native arteries of extremities with gangrene, right leg: Secondary | ICD-10-CM | POA: Diagnosis present

## 2024-03-18 DIAGNOSIS — F419 Anxiety disorder, unspecified: Secondary | ICD-10-CM | POA: Diagnosis present

## 2024-03-18 DIAGNOSIS — E871 Hypo-osmolality and hyponatremia: Secondary | ICD-10-CM | POA: Diagnosis present

## 2024-03-18 DIAGNOSIS — D649 Anemia, unspecified: Secondary | ICD-10-CM | POA: Diagnosis present

## 2024-03-18 DIAGNOSIS — I96 Gangrene, not elsewhere classified: Secondary | ICD-10-CM

## 2024-03-18 DIAGNOSIS — E1165 Type 2 diabetes mellitus with hyperglycemia: Secondary | ICD-10-CM

## 2024-03-18 DIAGNOSIS — B370 Candidal stomatitis: Secondary | ICD-10-CM | POA: Diagnosis present

## 2024-03-18 DIAGNOSIS — I10 Essential (primary) hypertension: Secondary | ICD-10-CM | POA: Diagnosis present

## 2024-03-18 DIAGNOSIS — R6510 Systemic inflammatory response syndrome (SIRS) of non-infectious origin without acute organ dysfunction: Secondary | ICD-10-CM | POA: Diagnosis present

## 2024-03-18 DIAGNOSIS — J4489 Other specified chronic obstructive pulmonary disease: Secondary | ICD-10-CM | POA: Diagnosis present

## 2024-03-18 DIAGNOSIS — Z794 Long term (current) use of insulin: Secondary | ICD-10-CM

## 2024-03-18 DIAGNOSIS — M869 Osteomyelitis, unspecified: Principal | ICD-10-CM | POA: Diagnosis present

## 2024-03-18 DIAGNOSIS — Z79899 Other long term (current) drug therapy: Secondary | ICD-10-CM

## 2024-03-18 LAB — CBC WITH DIFFERENTIAL/PLATELET
Abs Immature Granulocytes: 0.38 K/uL — ABNORMAL HIGH (ref 0.00–0.07)
Basophils Absolute: 0.1 K/uL (ref 0.0–0.1)
Basophils Relative: 1 %
Eosinophils Absolute: 0.3 K/uL (ref 0.0–0.5)
Eosinophils Relative: 3 %
HCT: 28.9 % — ABNORMAL LOW (ref 36.0–46.0)
Hemoglobin: 9.3 g/dL — ABNORMAL LOW (ref 12.0–15.0)
Immature Granulocytes: 4 %
Lymphocytes Relative: 23 %
Lymphs Abs: 2.2 K/uL (ref 0.7–4.0)
MCH: 30 pg (ref 26.0–34.0)
MCHC: 32.2 g/dL (ref 30.0–36.0)
MCV: 93.2 fL (ref 80.0–100.0)
Monocytes Absolute: 0.7 K/uL (ref 0.1–1.0)
Monocytes Relative: 8 %
Neutro Abs: 5.9 K/uL (ref 1.7–7.7)
Neutrophils Relative %: 61 %
Platelets: 326 K/uL (ref 150–400)
RBC: 3.1 MIL/uL — ABNORMAL LOW (ref 3.87–5.11)
RDW: 13.7 % (ref 11.5–15.5)
WBC: 9.7 K/uL (ref 4.0–10.5)
nRBC: 0 % (ref 0.0–0.2)

## 2024-03-18 LAB — COMPREHENSIVE METABOLIC PANEL WITH GFR
ALT: <5 U/L (ref 0–44)
AST: 25 U/L (ref 15–41)
Albumin: 3.2 g/dL — ABNORMAL LOW (ref 3.5–5.0)
Alkaline Phosphatase: 97 U/L (ref 38–126)
Anion gap: 9 (ref 5–15)
BUN: 28 mg/dL — ABNORMAL HIGH (ref 8–23)
CO2: 23 mmol/L (ref 22–32)
Calcium: 9 mg/dL (ref 8.9–10.3)
Chloride: 100 mmol/L (ref 98–111)
Creatinine, Ser: 1.52 mg/dL — ABNORMAL HIGH (ref 0.44–1.00)
GFR, Estimated: 38 mL/min — ABNORMAL LOW (ref >60)
Glucose, Bld: 165 mg/dL — ABNORMAL HIGH (ref 70–99)
Potassium: 4.7 mmol/L (ref 3.5–5.1)
Sodium: 132 mmol/L — ABNORMAL LOW (ref 135–145)
Total Bilirubin: 0.5 mg/dL (ref 0.0–1.2)
Total Protein: 6.8 g/dL (ref 6.5–8.1)

## 2024-03-18 NOTE — ED Triage Notes (Signed)
 Right food infection for a week, recently seen on another hospital and diagnosed with Osteomyelitis, per pt redness and swollen is getting worse.

## 2024-03-18 NOTE — ED Provider Triage Note (Signed)
 Emergency Medicine Provider Triage Evaluation Note  Michelle Nicholson , a 63 y.o. female  was evaluated in triage.  Pt says to me that she has come here for admission for osteomyelitis and Dr Harden consult.  Review of Systems  Positive: ulcer Negative: fever  Physical Exam  BP (!) 158/54 (BP Location: Right Arm)   Pulse 82   Temp 98.5 F (36.9 C)   Resp (!) 22   SpO2 98%  Gen:   Awake, no distress   Resp:  Normal effort  MSK:   Moves extremities without difficulty  Other:  Ulcer and necorsis of right toe, some surrounding celluitis   Medical Decision Making  Medically screening exam initiated at 9:52 PM.  Appropriate orders placed.  [name] was informed that the remainder of the evaluation will be completed by another provider, this initial triage assessment does not replace that evaluation, and the importance of remaining in the ED until their evaluation is complete.     Shermon Warren SAILOR, [place] 03/18/24 2153

## 2024-03-19 ENCOUNTER — Inpatient Hospital Stay (HOSPITAL_COMMUNITY)

## 2024-03-19 ENCOUNTER — Encounter (HOSPITAL_COMMUNITY): Payer: Self-pay

## 2024-03-19 DIAGNOSIS — I96 Gangrene, not elsewhere classified: Secondary | ICD-10-CM | POA: Diagnosis not present

## 2024-03-19 DIAGNOSIS — E1152 Type 2 diabetes mellitus with diabetic peripheral angiopathy with gangrene: Secondary | ICD-10-CM | POA: Diagnosis not present

## 2024-03-19 DIAGNOSIS — B370 Candidal stomatitis: Secondary | ICD-10-CM | POA: Diagnosis present

## 2024-03-19 DIAGNOSIS — Z794 Long term (current) use of insulin: Secondary | ICD-10-CM | POA: Diagnosis not present

## 2024-03-19 DIAGNOSIS — I6523 Occlusion and stenosis of bilateral carotid arteries: Secondary | ICD-10-CM | POA: Diagnosis not present

## 2024-03-19 DIAGNOSIS — E119 Type 2 diabetes mellitus without complications: Secondary | ICD-10-CM | POA: Diagnosis not present

## 2024-03-19 DIAGNOSIS — E785 Hyperlipidemia, unspecified: Secondary | ICD-10-CM | POA: Diagnosis present

## 2024-03-19 DIAGNOSIS — I70221 Atherosclerosis of native arteries of extremities with rest pain, right leg: Secondary | ICD-10-CM | POA: Diagnosis present

## 2024-03-19 DIAGNOSIS — E1065 Type 1 diabetes mellitus with hyperglycemia: Secondary | ICD-10-CM | POA: Diagnosis present

## 2024-03-19 DIAGNOSIS — Z95820 Peripheral vascular angioplasty status with implants and grafts: Secondary | ICD-10-CM | POA: Diagnosis not present

## 2024-03-19 DIAGNOSIS — L089 Local infection of the skin and subcutaneous tissue, unspecified: Secondary | ICD-10-CM | POA: Diagnosis present

## 2024-03-19 DIAGNOSIS — E1052 Type 1 diabetes mellitus with diabetic peripheral angiopathy with gangrene: Secondary | ICD-10-CM | POA: Diagnosis present

## 2024-03-19 DIAGNOSIS — F39 Unspecified mood [affective] disorder: Secondary | ICD-10-CM | POA: Diagnosis present

## 2024-03-19 DIAGNOSIS — I10 Essential (primary) hypertension: Secondary | ICD-10-CM | POA: Diagnosis present

## 2024-03-19 DIAGNOSIS — E1169 Type 2 diabetes mellitus with other specified complication: Secondary | ICD-10-CM | POA: Diagnosis not present

## 2024-03-19 DIAGNOSIS — Z8249 Family history of ischemic heart disease and other diseases of the circulatory system: Secondary | ICD-10-CM | POA: Diagnosis not present

## 2024-03-19 DIAGNOSIS — Z833 Family history of diabetes mellitus: Secondary | ICD-10-CM | POA: Diagnosis not present

## 2024-03-19 DIAGNOSIS — M86171 Other acute osteomyelitis, right ankle and foot: Secondary | ICD-10-CM | POA: Diagnosis not present

## 2024-03-19 DIAGNOSIS — M869 Osteomyelitis, unspecified: Principal | ICD-10-CM | POA: Diagnosis present

## 2024-03-19 DIAGNOSIS — R6510 Systemic inflammatory response syndrome (SIRS) of non-infectious origin without acute organ dysfunction: Secondary | ICD-10-CM | POA: Diagnosis present

## 2024-03-19 DIAGNOSIS — I70261 Atherosclerosis of native arteries of extremities with gangrene, right leg: Secondary | ICD-10-CM | POA: Diagnosis present

## 2024-03-19 DIAGNOSIS — Z7951 Long term (current) use of inhaled steroids: Secondary | ICD-10-CM | POA: Diagnosis not present

## 2024-03-19 DIAGNOSIS — E871 Hypo-osmolality and hyponatremia: Secondary | ICD-10-CM | POA: Diagnosis present

## 2024-03-19 DIAGNOSIS — J4489 Other specified chronic obstructive pulmonary disease: Secondary | ICD-10-CM | POA: Diagnosis present

## 2024-03-19 DIAGNOSIS — E1069 Type 1 diabetes mellitus with other specified complication: Secondary | ICD-10-CM | POA: Diagnosis present

## 2024-03-19 DIAGNOSIS — K8689 Other specified diseases of pancreas: Secondary | ICD-10-CM | POA: Diagnosis present

## 2024-03-19 DIAGNOSIS — E039 Hypothyroidism, unspecified: Secondary | ICD-10-CM | POA: Diagnosis present

## 2024-03-19 DIAGNOSIS — N179 Acute kidney failure, unspecified: Secondary | ICD-10-CM | POA: Diagnosis present

## 2024-03-19 DIAGNOSIS — D649 Anemia, unspecified: Secondary | ICD-10-CM | POA: Diagnosis present

## 2024-03-19 DIAGNOSIS — Z79890 Hormone replacement therapy (postmenopausal): Secondary | ICD-10-CM | POA: Diagnosis not present

## 2024-03-19 DIAGNOSIS — I7090 Unspecified atherosclerosis: Secondary | ICD-10-CM | POA: Diagnosis not present

## 2024-03-19 DIAGNOSIS — Z79899 Other long term (current) drug therapy: Secondary | ICD-10-CM | POA: Diagnosis not present

## 2024-03-19 DIAGNOSIS — E1040 Type 1 diabetes mellitus with diabetic neuropathy, unspecified: Secondary | ICD-10-CM | POA: Diagnosis present

## 2024-03-19 LAB — CBG MONITORING, ED
Glucose-Capillary: 162 mg/dL — ABNORMAL HIGH (ref 70–99)
Glucose-Capillary: 190 mg/dL — ABNORMAL HIGH (ref 70–99)

## 2024-03-19 LAB — CBC WITH DIFFERENTIAL/PLATELET
Abs Immature Granulocytes: 0.29 K/uL — ABNORMAL HIGH (ref 0.00–0.07)
Basophils Absolute: 0.1 K/uL (ref 0.0–0.1)
Basophils Relative: 1 %
Eosinophils Absolute: 0.3 K/uL (ref 0.0–0.5)
Eosinophils Relative: 5 %
HCT: 31.4 % — ABNORMAL LOW (ref 36.0–46.0)
Hemoglobin: 10.1 g/dL — ABNORMAL LOW (ref 12.0–15.0)
Immature Granulocytes: 4 %
Lymphocytes Relative: 21 %
Lymphs Abs: 1.4 K/uL (ref 0.7–4.0)
MCH: 30.4 pg (ref 26.0–34.0)
MCHC: 32.2 g/dL (ref 30.0–36.0)
MCV: 94.6 fL (ref 80.0–100.0)
Monocytes Absolute: 0.7 K/uL (ref 0.1–1.0)
Monocytes Relative: 10 %
Neutro Abs: 4 K/uL (ref 1.7–7.7)
Neutrophils Relative %: 59 %
Platelets: 329 K/uL (ref 150–400)
RBC: 3.32 MIL/uL — ABNORMAL LOW (ref 3.87–5.11)
RDW: 13.7 % (ref 11.5–15.5)
WBC: 6.8 K/uL (ref 4.0–10.5)
nRBC: 0 % (ref 0.0–0.2)

## 2024-03-19 LAB — BASIC METABOLIC PANEL WITH GFR
Anion gap: 8 (ref 5–15)
BUN: 22 mg/dL (ref 8–23)
CO2: 22 mmol/L (ref 22–32)
Calcium: 8.8 mg/dL — ABNORMAL LOW (ref 8.9–10.3)
Chloride: 104 mmol/L (ref 98–111)
Creatinine, Ser: 1.31 mg/dL — ABNORMAL HIGH (ref 0.44–1.00)
GFR, Estimated: 45 mL/min — ABNORMAL LOW (ref >60)
Glucose, Bld: 191 mg/dL — ABNORMAL HIGH (ref 70–99)
Potassium: 4.8 mmol/L (ref 3.5–5.1)
Sodium: 134 mmol/L — ABNORMAL LOW (ref 135–145)

## 2024-03-19 LAB — SEDIMENTATION RATE: Sed Rate: 59 mm/h — ABNORMAL HIGH (ref 0–22)

## 2024-03-19 LAB — HEMOGLOBIN A1C
Hgb A1c MFr Bld: 8.5 % — ABNORMAL HIGH (ref 4.8–5.6)
Mean Plasma Glucose: 197.25 mg/dL

## 2024-03-19 LAB — GLUCOSE, CAPILLARY
Glucose-Capillary: 86 mg/dL (ref 70–99)
Glucose-Capillary: 166 mg/dL — ABNORMAL HIGH (ref 70–99)

## 2024-03-19 LAB — HIV ANTIBODY (ROUTINE TESTING W REFLEX): HIV Screen 4th Generation wRfx: NONREACTIVE

## 2024-03-19 LAB — C-REACTIVE PROTEIN: CRP: 1 mg/dL — ABNORMAL HIGH (ref <1.0)

## 2024-03-19 MED ORDER — INSULIN ASPART 100 UNIT/ML IJ SOLN
0.0000 [IU] | Freq: Three times a day (TID) | INTRAMUSCULAR | Status: DC
  Administered 2024-03-19: 3 [IU] via SUBCUTANEOUS
  Administered 2024-03-20: 5 [IU] via SUBCUTANEOUS
  Administered 2024-03-20: 2 [IU] via SUBCUTANEOUS
  Administered 2024-03-20 – 2024-03-21 (×2): 3 [IU] via SUBCUTANEOUS
  Administered 2024-03-21: 5 [IU] via SUBCUTANEOUS
  Administered 2024-03-21: 8 [IU] via SUBCUTANEOUS
  Administered 2024-03-22: 5 [IU] via SUBCUTANEOUS
  Administered 2024-03-22: 3 [IU] via SUBCUTANEOUS
  Administered 2024-03-22: 11 [IU] via SUBCUTANEOUS
  Administered 2024-03-23: 8 [IU] via SUBCUTANEOUS
  Administered 2024-03-23: 3 [IU] via SUBCUTANEOUS
  Administered 2024-03-24 (×2): 11 [IU] via SUBCUTANEOUS
  Administered 2024-03-25: 5 [IU] via SUBCUTANEOUS
  Administered 2024-03-25: 3 [IU] via SUBCUTANEOUS
  Administered 2024-03-26: 11 [IU] via SUBCUTANEOUS
  Administered 2024-03-26: 3 [IU] via SUBCUTANEOUS
  Administered 2024-03-26: 5 [IU] via SUBCUTANEOUS
  Administered 2024-03-27: 8 [IU] via SUBCUTANEOUS

## 2024-03-19 MED ORDER — OXYCODONE HCL 5 MG PO TABS
5.0000 mg | ORAL_TABLET | ORAL | Status: DC | PRN
  Administered 2024-03-19 – 2024-03-27 (×5): 5 mg via ORAL
  Filled 2024-03-19 (×6): qty 1

## 2024-03-19 MED ORDER — INSULIN GLARGINE 100 UNIT/ML ~~LOC~~ SOLN
10.0000 [IU] | Freq: Every day | SUBCUTANEOUS | Status: DC
  Administered 2024-03-19 – 2024-03-21 (×3): 10 [IU] via SUBCUTANEOUS
  Filled 2024-03-19 (×6): qty 0.1

## 2024-03-19 MED ORDER — PANCRELIPASE (LIP-PROT-AMYL) 24000-76000 UNITS PO CPEP: 2.0000 | ORAL_CAPSULE | Freq: Every day | ORAL | Status: DC

## 2024-03-19 MED ORDER — VANCOMYCIN HCL 750 MG/150ML IV SOLN: 750.0000 mg | INTRAVENOUS | Status: DC

## 2024-03-19 MED ORDER — ATORVASTATIN CALCIUM 80 MG PO TABS
80.0000 mg | ORAL_TABLET | Freq: Every morning | ORAL | Status: DC
  Administered 2024-03-20 – 2024-03-27 (×6): 80 mg via ORAL
  Filled 2024-03-19 (×9): qty 1

## 2024-03-19 MED ORDER — LOSARTAN POTASSIUM 50 MG PO TABS
100.0000 mg | ORAL_TABLET | Freq: Every day | ORAL | Status: DC
  Administered 2024-03-20 – 2024-03-22 (×3): 100 mg via ORAL
  Filled 2024-03-19 (×3): qty 2

## 2024-03-19 MED ORDER — SODIUM CHLORIDE 0.9 % IV SOLN
2.0000 g | Freq: Once | INTRAVENOUS | Status: AC
  Administered 2024-03-19: 2 g via INTRAVENOUS
  Filled 2024-03-19: qty 20

## 2024-03-19 MED ORDER — MORPHINE SULFATE (PF) 2 MG/ML IV SOLN: 2.0000 mg | INTRAVENOUS | Status: DC | PRN

## 2024-03-19 MED ORDER — INSULIN GLARGINE-YFGN 100 UNIT/ML ~~LOC~~ SOLN: 10.0000 [IU] | Freq: Every day | SUBCUTANEOUS | Status: DC

## 2024-03-19 MED ORDER — SERTRALINE HCL 100 MG PO TABS
100.0000 mg | ORAL_TABLET | Freq: Every morning | ORAL | Status: DC
Start: 2024-03-19 — End: 2024-03-27
  Administered 2024-03-20 – 2024-03-27 (×8): 100 mg via ORAL
  Filled 2024-03-19 (×8): qty 1

## 2024-03-19 MED ORDER — SODIUM CHLORIDE 0.9 % IV SOLN
2.0000 g | INTRAVENOUS | Status: AC
  Administered 2024-03-19 – 2024-03-25 (×7): 2 g via INTRAVENOUS
  Filled 2024-03-19 (×7): qty 20

## 2024-03-19 MED ORDER — ACETAMINOPHEN 500 MG PO TABS
1000.0000 mg | ORAL_TABLET | Freq: Three times a day (TID) | ORAL | Status: DC
  Administered 2024-03-19 – 2024-03-27 (×24): 1000 mg via ORAL
  Filled 2024-03-19 (×24): qty 2

## 2024-03-19 MED ORDER — SODIUM CHLORIDE 0.9 % IV SOLN: INTRAVENOUS | Status: AC

## 2024-03-19 MED ORDER — LEVOTHYROXINE SODIUM 50 MCG PO TABS
50.0000 ug | ORAL_TABLET | Freq: Every day | ORAL | Status: DC
  Administered 2024-03-20 – 2024-03-27 (×8): 50 ug via ORAL
  Filled 2024-03-19 (×8): qty 1

## 2024-03-19 MED ORDER — SODIUM CHLORIDE 0.9 % IV BOLUS
1000.0000 mL | Freq: Once | INTRAVENOUS | Status: AC
  Administered 2024-03-19: 1000 mL via INTRAVENOUS

## 2024-03-19 MED ORDER — IPRATROPIUM-ALBUTEROL 0.5-2.5 (3) MG/3ML IN SOLN: 3.0000 mL | Freq: Four times a day (QID) | RESPIRATORY_TRACT | Status: DC | PRN

## 2024-03-19 MED ORDER — FLUTICASONE FUROATE-VILANTEROL 100-25 MCG/ACT IN AEPB
1.0000 | INHALATION_SPRAY | Freq: Every day | RESPIRATORY_TRACT | Status: DC
  Administered 2024-03-21 – 2024-03-26 (×5): 1 via RESPIRATORY_TRACT
  Filled 2024-03-19 (×3): qty 28

## 2024-03-19 MED ORDER — BUPROPION HCL ER (XL) 150 MG PO TB24
150.0000 mg | ORAL_TABLET | Freq: Every morning | ORAL | Status: DC
  Administered 2024-03-20 – 2024-03-27 (×8): 150 mg via ORAL
  Filled 2024-03-19 (×8): qty 1

## 2024-03-19 MED ORDER — PANCRELIPASE (LIP-PROT-AMYL) 12000-38000 UNITS PO CPEP
48000.0000 [IU] | ORAL_CAPSULE | Freq: Three times a day (TID) | ORAL | Status: DC
  Administered 2024-03-20 – 2024-03-26 (×17): 48000 [IU] via ORAL
  Filled 2024-03-19 (×26): qty 4

## 2024-03-19 MED ORDER — VANCOMYCIN HCL 1250 MG/250ML IV SOLN
1250.0000 mg | Freq: Once | INTRAVENOUS | Status: AC
  Administered 2024-03-19: 1250 mg via INTRAVENOUS
  Filled 2024-03-19: qty 250

## 2024-03-19 MED ORDER — VANCOMYCIN HCL IN DEXTROSE 1-5 GM/200ML-% IV SOLN
1000.0000 mg | INTRAVENOUS | Status: DC
  Administered 2024-03-20 – 2024-03-23 (×4): 1000 mg via INTRAVENOUS
  Filled 2024-03-19 (×5): qty 200

## 2024-03-19 MED ORDER — LATANOPROST 0.005 % OP SOLN
1.0000 [drp] | Freq: Every day | OPHTHALMIC | Status: DC
  Administered 2024-03-19 – 2024-03-26 (×7): 1 [drp] via OPHTHALMIC
  Filled 2024-03-19 (×2): qty 2.5

## 2024-03-19 NOTE — H&P (Addendum)
 History and Physical    Patient: Michelle Nicholson FMW:[id number] DOB: 02/09/1961 DOA: 03/18/2024 DOS: the patient was seen and examined on 03/19/2024 PCP: Darilyn Rosalva Bruckner, PA-C  Patient coming from: Home  Chief Complaint:  Chief Complaint  Patient presents with   Wound Check   HPI: Michelle Nicholson is a 63 y.o. female with medical history significant of CVA c/b chronic ambulatory problem, bilateral carotid stenosis status post endarterectomy, PAD, DM1, HTN, tobacco use, and reactive airway disease p/w RLE 3rd digit toe infection c/f osteomyelitis.  The patient presented with an infection that started [place] the beach after wearing a pair of shoes two weeks ago, which irritated a corn on the dorsal RLE 3rd digit, which reportedly was almost completely healed. The next day, redness appeared on the foot and leg, accompanied by soreness. The patient sent a picture to her podiatrist who recommended going to the ER due to her h/o diabetic foot infections. Testing at the ER resulted in a diagnosis of cellulitis, which was treated. An MRI performed there  revealed osteomyelitis in the bone of the RLE 3rd toe, and pt was offered amputation. The patient requested to return home for treatment, and she was discharged with strict recommendation to present to the local ED.  In the ED, pt hypertensive, and tachypneic w/o hypoxia. Labs notable for Cr 1.52 (baseline 1.1 in 01/2024), and ESR/CRP 59/1. XR R foot unremarkable. EDP requested medicine admission.  Review of Systems: As mentioned in the history of present illness. All other systems reviewed and are negative. 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 Her smoking use included cigarettes. She has never used smokeless tobacco. She reports that she does not drink alcohol and does not use drugs.  Allergies  Allergen Reactions   Dorzolamide Hcl-Timolol Mal Itching and Other (See Comments)    Makes her eyes burn. Conjunctival Injection with Burning. Visual Acuity Blurry   Brimonidine Rash and Other (See Comments)    Follicular conjunctivitis   Netarsudil-Latanoprost  Other (See Comments)    Redness, tearing, burning    Family History  Problem Relation Age of Onset   Cancer Mother    Hypertension Father    Diabetes Father    Cancer Sister    Diabetes Sister    Diabetes Brother  Kidney failure Brother    CAD Brother    Diabetes Other     Prior to Admission medications   Medication Sig Start Date End Date Taking? Authorizing Provider  acetaminophen  (TYLENOL ) 500 MG tablet Take 1,000 mg by mouth daily as needed for headache (pain). Patient not taking: Reported on 03/06/2023    [provider]  albuterol  (VENTOLIN  HFA) 108 (90 Base) MCG/ACT inhaler Inhale 1-2 puffs into the lungs every 6 (six) hours as needed for wheezing or shortness of breath.    [provider]  aspirin  325 MG tablet Take 1  tablet (325 mg total) by mouth daily. 11/30/21   Danford, Lonni SQUIBB, MD  atorvastatin  (LIPITOR ) 80 MG tablet Take 80 mg by mouth in the morning.    [provider]  buPROPion  (WELLBUTRIN  XL) 150 MG 24 hr tablet Take 150 mg by mouth every morning. 03/05/21   [provider]  Calcium -Magnesium -Zinc (CAL-MAG-ZINC PO) Take 1 tablet by mouth every morning.    [provider]  clopidogrel  (PLAVIX ) 75 MG tablet Take 1 tablet (75 mg total) by mouth daily. 12/24/23   Robins, Joshua E, MD  estradiol (ESTRACE) 0.1 MG/GM vaginal cream Place 1 Applicatorful vaginally at bedtime.    [provider]  ferrous sulfate  325 (65 FE) MG tablet Take 1 tablet (325 mg total) by mouth 2 (two) times daily with a meal. Patient taking differently: Take 325 mg by mouth every morning. 04/25/21 03/04/23  Christobal Guadalajara, MD  fluorouracil  (EFUDEX ) 5 % cream Apply 1 application. topically daily as needed (skin cancer breakouts).    [provider]  fluticasone -salmeterol (ADVAIR) 100-50 MCG/ACT AEPB Inhale 1 puff into the lungs daily as needed (shortness of breath).    [provider]  folic acid  (FOLVITE ) 1 MG tablet Take 1 mg by mouth every morning.    [provider]  GEMTESA 75 MG TABS Take 1 tablet by mouth daily.    [provider]  HYDROcodone -acetaminophen  (NORCO/VICODIN) 5-325 MG tablet Take 1 tablet by mouth every 6 (six) hours as needed for severe pain. 02/22/23   Roselyn Carlin NOVAK, MD  insulin  aspart (NOVOLOG ) 100 UNIT/ML FlexPen Inject 4 Units into the skin See admin instructions. Inject 4 units subcutaneously three times daily after meals - plus sliding scale adjustment - add 1 unit for every 50 units over 200    [provider]  Insulin  Degludec (TRESIBA  FLEXTOUCH) 100 UNIT/ML SOPN Inject 7 Units into the skin daily. Patient taking differently: Inject 18 Units into the skin daily after breakfast. P 12/10/15   Samtani, Jai-Gurmukh, MD   latanoprost  (XALATAN ) 0.005 % ophthalmic solution Place 1 drop into both eyes at bedtime. 04/10/21   [provider]  levothyroxine  (SYNTHROID ) 50 MCG tablet Take 50 mcg by mouth daily before breakfast.    [provider]  losartan  (COZAAR ) 100 MG tablet Take 100 mg by mouth daily.    [provider]  meloxicam (MOBIC) 15 MG tablet Take 15 mg by mouth daily.    [provider]  methazolamide  (NEPTAZANE ) 50 MG tablet Take 50 mg by mouth 2 (two) times daily.    [provider]  Multiple Vitamin (MULTIVITAMIN WITH MINERALS) TABS tablet Take 1 tablet by mouth every morning.    [provider]  Pancrelipase , Lip-Prot-Amyl, 24000-76000 units CPEP Take 2 capsules by mouth daily after breakfast.    [provider]  predniSONE  (DELTASONE ) 10 MG tablet Take 4 tablets by mouth daily for 2 days  then 2 tablets daily for 2 days then 1 tablet  daily for 2 days then stop 03/07/23   Jillian Buttery, MD  pregabalin  (LYRICA ) 300 MG capsule Take 300 mg by mouth 2 (two) times daily. 02/05/21   [provider]  sertraline  (ZOLOFT ) 100 MG tablet Take 100 mg by mouth every morning.    [provider]  sodium chloride  1 g tablet Take 1 g by mouth 3 (three) times daily as needed (brain fog from sodium insufficiency). 12/24/20   [provider]  Vitamin D, Ergocalciferol, (DRISDOL) 1.25 MG (50000 UNIT) CAPS capsule Take 50,000 Units by mouth every Sunday. 01/28/21   [provider]    Physical Exam: Vitals:   03/19/24 0156 03/19/24 0630 03/19/24 0645 03/19/24 0650  BP: (!) 134/58 (!) 135/51    Pulse: 72 60    Resp: 19 18    Temp: 98.6 F (37 C)   97.9 F (36.6 C)  TempSrc:    Oral  SpO2: 98% 91%    Weight:   70 kg   Height:   5' 8 (1.727 m)    General: Alert, oriented x3, resting comfortably in no acute distress Respiratory: Lungs clear to auscultation bilaterally with normal respiratory effort; no  w/r/r Cardiovascular: Regular rate and rhythm w/o m/r/g  MSK: RLE 3 digit wrapped with white guaze; c/d/i   Data Reviewed:  Lab Results  Component Value Date   WBC 9.7 03/18/2024   HGB 9.3 (L) 03/18/2024   HCT 28.9 (L) 03/18/2024   MCV 93.2 03/18/2024   PLT 326 03/18/2024   Lab Results  Component Value Date   GLUCOSE 165 (H) 03/18/2024   CALCIUM  9.0 03/18/2024   NA 132 (L) 03/18/2024   K 4.7 03/18/2024   CO2 23 03/18/2024   CL 100 03/18/2024   BUN 28 (H) 03/18/2024   CREATININE 1.52 (H) 03/18/2024   Lab Results  Component Value Date   ALT <5 03/18/2024   AST 25 03/18/2024   ALKPHOS 97 03/18/2024   BILITOT 0.5 03/18/2024   Lab Results  Component Value Date   INR 1.0 12/02/2021   INR 1.2 04/21/2021   Radiology: DG Foot Complete Right Result Date: 03/18/2024 CLINICAL DATA:  Infection, redness, swelling EXAM: RIGHT FOOT COMPLETE - 3+ VIEW COMPARISON:  None Available. FINDINGS: There is no evidence of fracture or dislocation. There is no evidence of arthropathy or other focal bone abnormality. Soft tissues are unremarkable. No bone destruction to suggest osteomyelitis. IMPRESSION: No acute bony abnormality. Electronically Signed   By: Franky Crease M.D.   On: 03/18/2024 22:11    Assessment and Plan: 63F h/o CVA c/b chronic ambulatory problem, bilateral carotid stenosis status post endarterectomy, PAD, DM1, HTN, tobacco use, COPD, and reactive airway disease p/w RLE 3rd digit toe infection c/f osteomyelitis.  AKI -MIVF: NS at 75cc/h for 24h -Strict I&Os and daily weights (standing preferred) -F/u BMP daily -Renally dose medications for CrCl -Avoid lovenox , NSAIDs, morphine , Fleet's phosphate enema, regular insulin , contrast; no gadolinium for MRI to avoid nephrogenic systemic fibrosis -Consider renal US  and nephrology consult if worsening AKI  RLE 3rd digit toe infection c/f osteomyelitis -Orthopedic surgery consulted as pt follows w/ Dr. Harden [facility] answering  service contacted on call MD Vernetta who will notify Dr. Harden; anticipate pt will be evaluated on Monday); appreciate eval/recs -IV vancomycin  per pharmacy and IV CTX 2g daily for now -PO tylenol  1g TID and oxycodone  5mg  q4h prn -F/u blood cultures; if positive, will need  TTE -F/u MRI R foot to eval for OM -Consider ID consult pending culture data and MRI  DM1 -Semglee  10U daily and SSI TID AC prn -Mod carb diet  HTN -PTA losartan  100mg  daily  PAD HLD -PTA atorvastatin  80mg  daily  COPD -Breo Ellipta  in place of pta Advair and Duoneb prn  Hypothyroid -PTA synthroid   Mood disorder -PTA sertraline    Advance Care Planning:   Code Status: Full Code   Consults: Orthopedic surgery and ID  Family Communication: Husband  Severity of Illness: The appropriate patient status for this patient is INPATIENT. Inpatient status is judged to be reasonable and necessary in order to provide the required intensity of service to ensure the patient's safety. The patient's presenting symptoms, physical exam findings, and initial radiographic and laboratory data in the context of their chronic comorbidities is felt to place them at high risk for further clinical deterioration. Furthermore, it is not anticipated that the patient will be medically stable for discharge from the hospital within 2 midnights of admission.   * I certify that at the point of admission it is my clinical judgment that the patient will require inpatient hospital care spanning beyond 2 midnights from the point of admission due to high intensity of service, high risk for further deterioration and high frequency of surveillance required.*   ------- I spent 55 minutes reviewing previous notes, at the bedside counseling/discussing the treatment plan, and performing clinical documentation.  Author: Marsha Ada, MD 03/19/2024 7:34 AM  For on call review [email or web address].

## 2024-03-19 NOTE — ED Provider Notes (Signed)
 [place] EMERGENCY DEPARTMENT AT [facility] Provider Note   CSN: [id number] Arrival date & time: 03/18/24  2131     Patient presents with: Wound Check   Michelle Nicholson is a 63 y.o. female.   The history is provided by the patient.  Wound Check   She has history of hypertension, diabetes with neuropathy, peripheral vascular disease, COPD and was just discharged from a hospital in [facility] where she was being treated for osteomyelitis of the right third toe.  She had been on IV antibiotics for about 10 days and was advised that she would likely need amputation but she wanted to return here for her ongoing care.  She had been given cefazolin  intravenously and was discharged with a prescription for cefadroxil.  She states that 2 days before she was hospitalized, she noted pain in her right foot after wearing a new pair of shoes and that is why she had gone to the hospital there.  She does state that she wants Dr. Harden to manage her amputation if needed.  Tonight, she is only complaining of mild to moderate pain in her foot.  Of note, she states the appearance of her toe is significantly better following 10 days of IV antibiotics.  She also had cellulitis of her right foot and leg which are improving.    Prior to Admission medications   Medication Sig Start Date End Date Taking? Authorizing Provider  acetaminophen  (TYLENOL ) 500 MG tablet Take 1,000 mg by mouth daily as needed for headache (pain). Patient not taking: Reported on 03/06/2023    [provider]  albuterol  (VENTOLIN  HFA) 108 (90 Base) MCG/ACT inhaler Inhale 1-2 puffs into the lungs every 6 (six) hours as needed for wheezing or shortness of breath.    [provider]  aspirin  325 MG tablet Take 1 tablet (325 mg total) by mouth daily. 11/30/21   Danford, Lonni SQUIBB, MD  atorvastatin  (LIPITOR ) 80 MG tablet Take 80 mg by mouth in the morning.    [provider]  buPROPion  (WELLBUTRIN  XL) 150 MG  24 hr tablet Take 150 mg by mouth every morning. 03/05/21   [provider]  Calcium -Magnesium -Zinc (CAL-MAG-ZINC PO) Take 1 tablet by mouth every morning.    [provider]  clopidogrel  (PLAVIX ) 75 MG tablet Take 1 tablet (75 mg total) by mouth daily. 12/24/23   Robins, Joshua E, MD  estradiol (ESTRACE) 0.1 MG/GM vaginal cream Place 1 Applicatorful vaginally at bedtime.    [provider]  ferrous sulfate  325 (65 FE) MG tablet Take 1 tablet (325 mg total) by mouth 2 (two) times daily with a meal. Patient taking differently: Take 325 mg by mouth every morning. 04/25/21 03/04/23  Christobal Guadalajara, MD  fluorouracil  (EFUDEX ) 5 % cream Apply 1 application. topically daily as needed (skin cancer breakouts).    [provider]  fluticasone -salmeterol (ADVAIR) 100-50 MCG/ACT AEPB Inhale 1 puff into the lungs daily as needed (shortness of breath).    [provider]  folic acid  (FOLVITE ) 1 MG tablet Take 1 mg by mouth every morning.    [provider]  GEMTESA 75 MG TABS Take 1 tablet by mouth daily.    [provider]  HYDROcodone -acetaminophen  (NORCO/VICODIN) 5-325 MG tablet Take 1 tablet by mouth every 6 (six) hours as needed for severe pain. 02/22/23   Roselyn Carlin NOVAK, MD  insulin  aspart (NOVOLOG ) 100 UNIT/ML FlexPen Inject 4 Units into the skin See admin instructions. Inject 4 units subcutaneously  three times daily after meals - plus sliding scale adjustment - add 1 unit for every 50 units over 200    [provider]  Insulin  Degludec (TRESIBA  FLEXTOUCH) 100 UNIT/ML SOPN Inject 7 Units into the skin daily. Patient taking differently: Inject 18 Units into the skin daily after breakfast. P 12/10/15   Samtani, Jai-Gurmukh, MD  latanoprost  (XALATAN ) 0.005 % ophthalmic solution Place 1 drop into both eyes at bedtime. 04/10/21   [provider]  levothyroxine  (SYNTHROID ) 50 MCG tablet Take 50 mcg by mouth daily before breakfast.     [provider]  losartan  (COZAAR ) 100 MG tablet Take 100 mg by mouth daily.    [provider]  meloxicam (MOBIC) 15 MG tablet Take 15 mg by mouth daily.    [provider]  methazolamide  (NEPTAZANE ) 50 MG tablet Take 50 mg by mouth 2 (two) times daily.    [provider]  Multiple Vitamin (MULTIVITAMIN WITH MINERALS) TABS tablet Take 1 tablet by mouth every morning.    [provider]  Pancrelipase , Lip-Prot-Amyl, 24000-76000 units CPEP Take 2 capsules by mouth daily after breakfast.    [provider]  predniSONE  (DELTASONE ) 10 MG tablet Take 4 tablets by mouth daily for 2 days then 2 tablets daily for 2 days then 1 tablet  daily for 2 days then stop 03/07/23   Jillian Buttery, MD  pregabalin  (LYRICA ) 300 MG capsule Take 300 mg by mouth 2 (two) times daily. 02/05/21   [provider]  sertraline  (ZOLOFT ) 100 MG tablet Take 100 mg by mouth every morning.    [provider]  sodium chloride  1 g tablet Take 1 g by mouth 3 (three) times daily as needed (brain fog from sodium insufficiency). 12/24/20   [provider]  Vitamin D, Ergocalciferol, (DRISDOL) 1.25 MG (50000 UNIT) CAPS capsule Take 50,000 Units by mouth every Sunday. 01/28/21   [provider]    Allergies: Dorzolamide hcl-timolol mal, Brimonidine, and Netarsudil-latanoprost     Review of Systems  All other systems reviewed and are negative.   Updated Vital Signs BP (!) 134/58 (BP Location: Right Arm)   Pulse 72   Temp 98.6 F (37 C)   Resp 19   SpO2 98%   Physical Exam Vitals and nursing note reviewed.   63 year old female, resting comfortably and in no acute distress. Vital signs are normal. Oxygen saturation is 98%, which is normal. Head is normocephalic and atraumatic. PERRLA, EOMI.  Lungs are clear without rales, wheezes, or rhonchi. Chest is nontender. Heart has regular rate and rhythm without murmur. Abdomen is soft, flat,  nontender.  Numerous ecchymoses noted, apparently from enoxaparin  injections. Extremities: Right third toe is dusky and appears necrotic.  There is mild erythema over the dorsum of the right foot and over the medial aspect of the right lower leg.  These areas of erythema are warm to the touch and tender.  There are no lymphangitic streaks. Skin is warm and dry without rash. Neurologic: Mental status is normal, cranial nerves are intact, moves all extremities equally.         (all labs ordered are listed, but only abnormal results are displayed) Labs Reviewed  CBC WITH DIFFERENTIAL/PLATELET - Abnormal; Notable for the following components:      Result Value   RBC 3.10 (*)    Hemoglobin 9.3 (*)    HCT 28.9 (*)    Abs Immature Granulocytes 0.38 (*)    All other components within normal  limits  COMPREHENSIVE METABOLIC PANEL WITH GFR - Abnormal; Notable for the following components:   Sodium 132 (*)    Glucose, Bld 165 (*)    BUN 28 (*)    Creatinine, Ser 1.52 (*)    Albumin  3.2 (*)    GFR, Estimated 38 (*)    All other components within normal limits    EKG: None  Radiology: DG Foot Complete Right Result Date: 03/18/2024 CLINICAL DATA:  Infection, redness, swelling EXAM: RIGHT FOOT COMPLETE - 3+ VIEW COMPARISON:  None Available. FINDINGS: There is no evidence of fracture or dislocation. There is no evidence of arthropathy or other focal bone abnormality. Soft tissues are unremarkable. No bone destruction to suggest osteomyelitis. IMPRESSION: No acute bony abnormality. Electronically Signed   By: Franky Crease M.D.   On: 03/18/2024 22:11     Procedures   Medications Ordered in the ED - No data to display                                  Medical Decision Making Amount and/or Complexity of Data Reviewed Labs: ordered. Radiology: ordered.   Osteomyelitis of the right third toe.  The toe does appear necrotic and I strongly suspect that it will need amputation.  X-ray of the  right foot does not show evidence of osteomyelitis.  I have independently viewed the images, and agree with the radiologist's interpretation.  I have reviewed her laboratory tests, and my interpretation is anemia with 1.3 g drop compared with 01/23/2024 most likely related to presence of infection, renal insufficiency which has worsened compared with 01/23/2024, mild hyponatremia which is not felt to be clinically significant.  In light of worsening renal function I have ordered IV fluids and also dose of IV ceftriaxone .  She has a CD which she brought from the other hospital which includes an MRI scan done there, so MRI is not repeated here.  I have discussed case with Dr. Lucie of [facility], who agrees to admit the patient.     Final diagnoses:  Osteomyelitis of third toe of right foot (HCC)  Renal insufficiency  Normochromic normocytic anemia  Hyponatremia    ED Discharge Orders     None          Raford Lenis, MD 03/19/24 [id number]

## 2024-03-19 NOTE — Progress Notes (Signed)
 Pharmacy Antibiotic Note  Michelle Nicholson is a 63 y.o. female admitted on 03/18/2024 with wound infection.  Pharmacy has been consulted for Vancomycin  dosing. WBC WNL. Noted bump in Scr.   Plan: Vancomycin  1250 mg IV x 1, then 750 mg IV q24h >>>Estimated AUC: 439 Ceftriaxone  per MD Trend WBC, temp, renal function  F/U infectious work-up Drug levels as indicated  Temp (24hrs), Avg:98.6 F (37 C), Min:98.5 F (36.9 C), Max:98.6 F (37 C)  Recent Labs  Lab 03/18/24 2151  WBC 9.7  CREATININE 1.52*    CrCl cannot be calculated (Unknown ideal weight.).    Allergies  Allergen Reactions   Dorzolamide Hcl-Timolol Mal Itching and Other (See Comments)    Makes her eyes burn. Conjunctival Injection with Burning. Visual Acuity Blurry   Brimonidine Rash and Other (See Comments)    Follicular conjunctivitis   Netarsudil-Latanoprost  Other (See Comments)    Redness, tearing, burning    Lynwood Mckusick, PharmD, BCPS Clinical Pharmacist Phone: [phone]

## 2024-03-19 NOTE — Plan of Care (Signed)

## 2024-03-19 NOTE — ED Notes (Signed)
 Patient transported to MRI

## 2024-03-19 NOTE — Plan of Care (Signed)

## 2024-03-19 NOTE — Progress Notes (Signed)
 Pharmacy Antibiotic Note  Michelle Nicholson is a 63 y.o. female admitted on 03/18/2024 with wound infection.  Pharmacy has been consulted for Vancomycin  dosing. WBC WNL. Vancomycin  1250mg  x1 was given yesterday with the plan of 750mg  IV every 24 hours. With improved renal function, will increase dose to 1000mg  IV every 24 hours. (AUC 487.2) (Scr 1.31, Vd 0.72) Plan: -Increase Vancomycin  to 1000mg  IV every 24 hours -Continue Ceftriaxone  per MD -Trend WBC, temp, renal function  -F/U infectious work-up -Collect vancomycin  level at steady state and as indicated  Temp (24hrs), Avg:98.2 F (36.8 C), Min:97.9 F (36.6 C), Max:98.6 F (37 C)  Recent Labs  Lab 03/18/24 2151 03/19/24 1211  WBC 9.7 6.8  CREATININE 1.52* 1.31*    Estimated Creatinine Clearance: 43.8 mL/min (A) (by C-G formula based on SCr of 1.31 mg/dL (H)).    Allergies  Allergen Reactions   Dorzolamide Hcl-Timolol Mal Itching and Other (See Comments)    Makes her eyes burn. Conjunctival Injection with Burning. Visual Acuity Blurry   Brimonidine Rash and Other (See Comments)    Follicular conjunctivitis   Netarsudil-Latanoprost  Other (See Comments)    Redness, tearing, burning    Thank you for allowing pharmacy to be involved with this patient's care.  Mendel Barter, PharmD PGY1 Clinical Pharmacist [facility]  03/19/2024 3:08 PM

## 2024-03-20 ENCOUNTER — Inpatient Hospital Stay (HOSPITAL_COMMUNITY)

## 2024-03-20 DIAGNOSIS — M869 Osteomyelitis, unspecified: Secondary | ICD-10-CM | POA: Diagnosis not present

## 2024-03-20 DIAGNOSIS — I739 Peripheral vascular disease, unspecified: Secondary | ICD-10-CM

## 2024-03-20 DIAGNOSIS — I7090 Unspecified atherosclerosis: Secondary | ICD-10-CM | POA: Diagnosis not present

## 2024-03-20 DIAGNOSIS — J449 Chronic obstructive pulmonary disease, unspecified: Secondary | ICD-10-CM | POA: Insufficient documentation

## 2024-03-20 DIAGNOSIS — I96 Gangrene, not elsewhere classified: Secondary | ICD-10-CM

## 2024-03-20 DIAGNOSIS — E039 Hypothyroidism, unspecified: Secondary | ICD-10-CM | POA: Insufficient documentation

## 2024-03-20 LAB — CBC
HCT: 28 % — ABNORMAL LOW (ref 36.0–46.0)
Hemoglobin: 8.9 g/dL — ABNORMAL LOW (ref 12.0–15.0)
MCH: 29.4 pg (ref 26.0–34.0)
MCHC: 31.8 g/dL (ref 30.0–36.0)
MCV: 92.4 fL (ref 80.0–100.0)
Platelets: 311 K/uL (ref 150–400)
RBC: 3.03 MIL/uL — ABNORMAL LOW (ref 3.87–5.11)
RDW: 13.9 % (ref 11.5–15.5)
WBC: 7.1 K/uL (ref 4.0–10.5)
nRBC: 0 % (ref 0.0–0.2)

## 2024-03-20 LAB — GLUCOSE, CAPILLARY
Glucose-Capillary: 185 mg/dL — ABNORMAL HIGH (ref 70–99)
Glucose-Capillary: 186 mg/dL — ABNORMAL HIGH (ref 70–99)
Glucose-Capillary: 239 mg/dL — ABNORMAL HIGH (ref 70–99)
Glucose-Capillary: 121 mg/dL — ABNORMAL HIGH (ref 70–99)
Glucose-Capillary: 154 mg/dL — ABNORMAL HIGH (ref 70–99)

## 2024-03-20 LAB — BASIC METABOLIC PANEL WITH GFR
Anion gap: 12 (ref 5–15)
BUN: 21 mg/dL (ref 8–23)
CO2: 19 mmol/L — ABNORMAL LOW (ref 22–32)
Calcium: 8.4 mg/dL — ABNORMAL LOW (ref 8.9–10.3)
Chloride: 104 mmol/L (ref 98–111)
Creatinine, Ser: 1.36 mg/dL — ABNORMAL HIGH (ref 0.44–1.00)
GFR, Estimated: 43 mL/min — ABNORMAL LOW (ref >60)
Glucose, Bld: 233 mg/dL — ABNORMAL HIGH (ref 70–99)
Potassium: 4.7 mmol/L (ref 3.5–5.1)
Sodium: 135 mmol/L (ref 135–145)

## 2024-03-20 LAB — VAS US ABI WITH/WO TBI
Left ABI: 0.93
Right ABI: 0.51

## 2024-03-20 MED ORDER — NYSTATIN 100000 UNIT/ML MT SUSP
5.0000 mL | Freq: Four times a day (QID) | OROMUCOSAL | Status: DC
  Administered 2024-03-20 – 2024-03-27 (×22): 500000 [IU] via ORAL
  Filled 2024-03-20 (×24): qty 5

## 2024-03-20 MED ORDER — ASPIRIN 81 MG PO TBEC
81.0000 mg | DELAYED_RELEASE_TABLET | Freq: Every day | ORAL | Status: DC
  Administered 2024-03-21 – 2024-03-27 (×7): 81 mg via ORAL
  Filled 2024-03-20 (×7): qty 1

## 2024-03-20 MED ORDER — NICOTINE 14 MG/24HR TD PT24
14.0000 mg | MEDICATED_PATCH | Freq: Every day | TRANSDERMAL | Status: DC
  Administered 2024-03-20 – 2024-03-27 (×8): 14 mg via TRANSDERMAL
  Filled 2024-03-20 (×8): qty 1

## 2024-03-20 MED ORDER — TRAZODONE HCL 50 MG PO TABS
50.0000 mg | ORAL_TABLET | Freq: Once | ORAL | Status: AC
  Administered 2024-03-20: 50 mg via ORAL
  Filled 2024-03-20: qty 1

## 2024-03-20 NOTE — Assessment & Plan Note (Signed)
 Glucoses labile - Continue SS corrections - Continue glargine

## 2024-03-20 NOTE — Assessment & Plan Note (Signed)
 -  Continue Lipitor

## 2024-03-20 NOTE — Assessment & Plan Note (Signed)
 -  Continue ICS/LABA

## 2024-03-20 NOTE — Plan of Care (Signed)
 Problem: Education: Goal: Ability to describe self-care measures that may prevent or decrease complications (Diabetes Survival Skills Education) will improve 03/20/2024 1328 by Jesusa Stenerson K, RN Outcome: Progressing 03/20/2024 1328 by Henderson Frampton K, RN Outcome: Progressing Goal: Individualized Educational Video(s) 03/20/2024 1328 by Maja Mccaffery K, RN Outcome: Progressing 03/20/2024 1328 by Laurier Jasperson K, RN Outcome: Progressing   Problem: Coping: Goal: Ability to adjust to condition or change in health will improve 03/20/2024 1328 by Kimberley Dastrup K, RN Outcome: Progressing 03/20/2024 1328 by Jyl Chico K, RN Outcome: Progressing   Problem: Fluid Volume: Goal: Ability to maintain a balanced intake and output will improve 03/20/2024 1328 by Trayton Szabo K, RN Outcome: Progressing 03/20/2024 1328 by Adrienne Delay K, RN Outcome: Progressing   Problem: Health Behavior/Discharge Planning: Goal: Ability to identify and utilize available resources and services will improve 03/20/2024 1328 by Maymunah Stegemann K, RN Outcome: Progressing 03/20/2024 1328 by Keiondra Brookover K, RN Outcome: Progressing Goal: Ability to manage health-related needs will improve 03/20/2024 1328 by Elona Yinger K, RN Outcome: Progressing 03/20/2024 1328 by Farrell Broerman K, RN Outcome: Progressing   Problem: Metabolic: Goal: Ability to maintain appropriate glucose levels will improve 03/20/2024 1328 by Avrielle Fry K, RN Outcome: Progressing 03/20/2024 1328 by Anyeli Hockenbury K, RN Outcome: Progressing   Problem: Nutritional: Goal: Maintenance of adequate nutrition will improve 03/20/2024 1328 by Averill Winters K, RN Outcome: Progressing 03/20/2024 1328 by Gwenneth Whiteman K, RN Outcome: Progressing Goal: Progress toward achieving an optimal weight will improve 03/20/2024 1328 by Airam Heidecker K, RN Outcome: Progressing 03/20/2024 1328 by Anselmo Reihl K, RN Outcome: Progressing   Problem: Skin Integrity: Goal: Risk for impaired skin integrity will decrease 03/20/2024 1328 by  Kloe Oates K, RN Outcome: Progressing 03/20/2024 1328 by Danitza Schoenfeldt K, RN Outcome: Progressing   Problem: Tissue Perfusion: Goal: Adequacy of tissue perfusion will improve 03/20/2024 1328 by Kathlynn Swofford K, RN Outcome: Progressing 03/20/2024 1328 by Anaisabel Pederson K, RN Outcome: Progressing   Problem: Education: Goal: Knowledge of General Education information will improve Description: Including pain rating scale, medication(s)/side effects and non-pharmacologic comfort measures 03/20/2024 1328 by Vuong Musa K, RN Outcome: Progressing 03/20/2024 1328 by Adabelle Griffiths K, RN Outcome: Progressing   Problem: Health Behavior/Discharge Planning: Goal: Ability to manage health-related needs will improve 03/20/2024 1328 by Rafiel Mecca K, RN Outcome: Progressing 03/20/2024 1328 by Kavari Parrillo K, RN Outcome: Progressing   Problem: Clinical Measurements: Goal: Ability to maintain clinical measurements within normal limits will improve 03/20/2024 1328 by Arshia Spellman K, RN Outcome: Progressing 03/20/2024 1328 by Keylen Eckenrode K, RN Outcome: Progressing Goal: Will remain free from infection 03/20/2024 1328 by Kalyiah Saintil K, RN Outcome: Not Progressing 03/20/2024 1328 by Davonne Jarnigan K, RN Outcome: Progressing Goal: Diagnostic test results will improve 03/20/2024 1328 by Ariea Rochin K, RN Outcome: Progressing 03/20/2024 1328 by Roselind Klus K, RN Outcome: Progressing Goal: Respiratory complications will improve 03/20/2024 1328 by Jahsir Rama K, RN Outcome: Progressing 03/20/2024 1328 by Tanice Petre K, RN Outcome: Progressing Goal: Cardiovascular complication will be avoided 03/20/2024 1328 by Zaid Tomes K, RN Outcome: Progressing 03/20/2024 1328 by Trevan Messman K, RN Outcome: Progressing   Problem: Nutrition: Goal: Adequate nutrition will be maintained 03/20/2024 1328 by Jaishawn Witzke K, RN Outcome: Progressing 03/20/2024 1328 by Gabriela Irigoyen K, RN Outcome: Progressing   Problem: Activity: Goal: Risk for activity intolerance will  decrease 03/20/2024 1328 by Dejohn Ibarra K, RN Outcome: Progressing 03/20/2024 1328 by Lanier Felty K, RN Outcome: Progressing  Problem: Coping: Goal: Level of anxiety will decrease 03/20/2024 1328 by Philipe Laswell K, RN Outcome: Progressing 03/20/2024 1328 by Maansi Wike K, RN Outcome: Progressing   Problem: Elimination: Goal: Will not experience complications related to bowel motility 03/20/2024 1328 by Genia Perin K, RN Outcome: Progressing 03/20/2024 1328 by Lanijah Warzecha K, RN Outcome: Progressing Goal: Will not experience complications related to urinary retention 03/20/2024 1328 by Dailee Manalang K, RN Outcome: Progressing 03/20/2024 1328 by Camp Gopal K, RN Outcome: Progressing

## 2024-03-20 NOTE — Consult Note (Addendum)
 ORTHOPAEDIC CONSULTATION  REQUESTING PHYSICIAN: Jonel Lonni SQUIBB, *  Chief Complaint: Gangrene right third toe  HPI: Michelle Nicholson is a 63 y.o. female who presents with gangrenous changes of the right third toe.  She states this has been present for about 3 weeks.  [place] she states the cellulitis has improved.  Patient did have a CT arteriogram which stated that the left popliteal artery was closed with peripheral vascular disease bilaterally.  Patient also had an MRI scan that showed osteomyelitis of the third toe right foot.  Patient states she has a extensive family history with diabetes and peripheral vascular disease with multiple amputations within the family.  Patient states she was first diagnosed with diabetes age 30.  Past Medical History:  Diagnosis Date   Anemia    Arthritis    Arthritis of right hip 03/07/2023   Cancer (HCC)    skin ca, squamous, basal   COPD (chronic obstructive pulmonary disease) (HCC)    Diabetes mellitus    Dyspnea    Family history of adverse reaction to anesthesia    `mother - N/V, Brother N/V   Hypertension    Hypothyroidism    Labral tear of right hip joint 03/07/2023   Neuropathy    Pancreas disorder    Peripheral vascular disease (HCC)    Pneumonia    04/2021, 2003   Stroke 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, equilibrium   Past Surgical History:  Procedure Laterality Date   CARDIAC CATHETERIZATION     ENDARTERECTOMY Left 12/02/2021   Procedure: LEFT CAROTID ENDARTERECTOMY;  Surgeon: Lanis Fonda BRAVO, MD;  Location: [facility] OR;  Service: Vascular;  Laterality: Left;   PATCH ANGIOPLASTY Left 12/02/2021   Procedure: PATCH ANGIOPLASTY USING [employer] 1cm x 6cm PATCH;  Surgeon: Lanis Fonda BRAVO, MD;  Location: [facility] OR;  Service: Vascular;  Laterality: Left;   Social History   Socioeconomic History   Marital status: Widowed    Spouse name: Not on  file   Number of children: Not on file   Years of education: Not on file   Highest education level: Not on file  Occupational History   Not on file  Tobacco Use   Smoking status: Former    Current packs/day: 0.00    Types: Cigarettes    Quit date: 11/27/2021    Years since quitting: 2.3   Smokeless tobacco: Never  Vaping Use   Vaping status: Never Used  Substance and Sexual Activity   Alcohol use: No    Comment: 2 drinks a month   Drug use: No   Sexual activity: Not on file  Other Topics Concern   Not on file  Social History Narrative   Not on file   Social Drivers of Health   Financial Resource Strain: Low Risk  (01/21/2024)   Received from [facility]   Overall Financial Resource Strain (CARDIA)    Difficulty of Paying Living Expenses: Not hard at all  Food Insecurity: No Food Insecurity (03/19/2024)   Hunger Vital Sign    Worried About Running Out of Food in the Last Year: Never true    Ran Out of Food in the Last Year: Never true  Transportation Needs: No Transportation Needs (03/19/2024)   PRAPARE - Administrator, Civil Service (Medical): No    Lack of Transportation (Non-Medical): No  Physical Activity: Insufficiently Active (11/16/2019)  Received from [facility]   Exercise Vital Sign    On average, how many days per week do you engage in moderate to strenuous exercise (like a brisk walk)?: 3 days    On average, how many minutes do you engage in exercise at this level?: 20 min  Stress: No Stress Concern Present (04/09/2021)   Received from [facility] - Occupational Stress Questionnaire    Feeling of Stress : Not at all  Social Connections: Unknown (11/22/2021)   Received from [facility]   Social Network    Social Network: Not on file   Family History  Problem Relation Age of Onset   Cancer Mother    Hypertension Father    Diabetes Father    Cancer Sister     Diabetes Sister    Diabetes Brother    Kidney failure Brother    CAD Brother    Diabetes Other    - negative except otherwise stated in the family history section Allergies  Allergen Reactions   Dorzolamide Hcl-Timolol Mal Itching and Other (See Comments)    Makes her eyes burn. Conjunctival Injection with Burning. Visual Acuity Blurry   Brimonidine Rash and Other (See Comments)    Follicular conjunctivitis   Netarsudil-Latanoprost  Other (See Comments)    Redness, tearing, burning   Prior to Admission medications   Medication Sig Start Date End Date Taking? Authorizing Provider  albuterol  (VENTOLIN  HFA) 108 (90 Base) MCG/ACT inhaler Inhale 1-2 puffs into the lungs every 6 (six) hours as needed for wheezing or shortness of breath.   Yes [provider]  amLODipine  (NORVASC ) 2.5 MG tablet Take 7.5 mg by mouth every evening. 01/21/24  Yes [provider]  aspirin  325 MG tablet Take 1 tablet (325 mg total) by mouth daily. 11/30/21  Yes Danford, Lonni SQUIBB, MD  atorvastatin  (LIPITOR ) 80 MG tablet Take 80 mg by mouth in the morning.   Yes [provider]  buPROPion  (WELLBUTRIN  XL) 150 MG 24 hr tablet Take 150 mg by mouth every morning. 03/05/21  Yes [provider]  Calcium -Magnesium -Zinc (CAL-MAG-ZINC PO) Take 1 tablet by mouth every morning.   Yes [provider]  cloNIDine (CATAPRES) 0.1 MG tablet Take 0.1 mg by mouth 2 (two) times daily as needed (high BP). 01/06/24  Yes [provider]  clopidogrel  (PLAVIX ) 75 MG tablet Take 1 tablet (75 mg total) by mouth daily. 12/24/23  Yes Lanis Fonda BRAVO, MD  Continuous Glucose Sensor (FREESTYLE LIBRE 3 PLUS SENSOR) MISC Inject 1 Application into the skin once a week. 01/22/24  Yes [provider]  estradiol (ESTRACE) 0.1 MG/GM vaginal cream Place 1 Applicatorful vaginally at bedtime as needed (vaginal irritation).   Yes [provider]  fluorouracil  (EFUDEX ) 5 % cream Apply 1  application. topically daily as needed (skin cancer breakouts).   Yes [provider]  GEMTESA 75 MG TABS Take 1 tablet by mouth daily.   Yes [provider]  Glucagon (GVOKE HYPOPEN 2-PACK) 1 MG/0.2ML SOAJ Inject 1 mg into the skin as needed (hypoglycemia). 11/17/22  Yes [provider]  insulin  aspart (NOVOLOG ) 100 UNIT/ML FlexPen Inject 4 Units into the skin 3 (three) times daily with meals. Per sliding scale: Inject 4 units subcutaneously three times daily after meals - plus sliding scale adjustment - add 1 unit for every 50 units over 200   Yes [provider]  Insulin  Degludec (TRESIBA  FLEXTOUCH) 100 UNIT/ML SOPN Inject  7 Units into the skin daily. Patient taking differently: Inject 15 Units into the skin daily after breakfast. 12/10/15  Yes Samtani, Jai-Gurmukh, MD  latanoprost  (XALATAN ) 0.005 % ophthalmic solution Place 1 drop into both eyes at bedtime. 04/10/21  Yes [provider]  levothyroxine  (SYNTHROID ) 50 MCG tablet Take 50 mcg by mouth daily before breakfast.   Yes [provider]  MAGNESIUM -OXIDE 400 (240 Mg) MG tablet Take 1 tablet by mouth daily. 12/31/23  Yes [provider]  methenamine  (HIPREX ) 1 g tablet Take 1 g by mouth 2 (two) times daily. 03/05/24  Yes [provider]  Multiple Vitamin (MULTIVITAMIN WITH MINERALS) TABS tablet Take 1 tablet by mouth every morning.   Yes [provider]  NEURONTIN  300 MG capsule Take 300 mg by mouth 3 (three) times daily. 12/01/23  Yes [provider]  nicotine  (NICODERM CQ  - DOSED IN MG/24 HOURS) 14 mg/24hr patch Place 14 mg onto the skin daily. 03/18/24  Yes [provider]  nystatin  (MYCOSTATIN ) 100000 UNIT/ML suspension Take 5 mLs by mouth 4 (four) times daily as needed (throat pain). 03/18/24  Yes [provider]  Pancrelipase , Lip-Prot-Amyl, 24000-76000 units CPEP Take 2 capsules by mouth in the morning, at noon, and at bedtime.   Yes  [provider]  pantoprazole  (PROTONIX ) 40 MG tablet Take 40 mg by mouth 2 (two) times daily. 03/01/24  Yes [provider]  sertraline  (ZOLOFT ) 100 MG tablet Take 100 mg by mouth every morning.   Yes [provider]  sodium chloride  1 g tablet Take 1 g by mouth daily. 12/24/20  Yes [provider]  tacrolimus (PROTOPIC) 0.1 % ointment Apply 1 Application topically 2 (two) times daily. 12/21/23  Yes [provider]  traZODone  (DESYREL ) 100 MG tablet Take 100 mg by mouth at bedtime. 12/29/23  Yes [provider]  triamcinolone cream (KENALOG) 0.1 % Apply 1 Application topically 2 (two) times daily.   Yes [provider]  valsartan (DIOVAN) 320 MG tablet Take 320 mg by mouth daily. 12/29/23  Yes [provider]  Vitamin D, Ergocalciferol, (DRISDOL) 1.25 MG (50000 UNIT) CAPS capsule Take 50,000 Units by mouth every Sunday. 01/28/21  Yes [provider]  methazolamide  (NEPTAZANE ) 50 MG tablet Take 50 mg by mouth 2 (two) times daily.    [provider]   DG Foot Complete Right Result Date: 03/18/2024 CLINICAL DATA:  Infection, redness, swelling EXAM: RIGHT FOOT COMPLETE - 3+ VIEW COMPARISON:  None Available. FINDINGS: There is no evidence of fracture or dislocation. There is no evidence of arthropathy or other focal bone abnormality. Soft tissues are unremarkable. No bone destruction to suggest osteomyelitis. IMPRESSION: No acute bony abnormality. Electronically Signed   By: Franky Crease M.D.   On: 03/18/2024 22:11   - pertinent xrays, CT, MRI studies were reviewed and independently interpreted  Positive ROS: All other systems have been reviewed and were otherwise negative with the exception of those mentioned in the HPI and as above.  Physical Exam: General: Alert, no acute distress Psychiatric: Patient is competent for consent with normal mood and affect Lymphatic: No axillary or cervical  lymphadenopathy Cardiovascular: No pedal edema Respiratory: No cyanosis, no use of accessory musculature GI: No organomegaly, abdomen is soft and non-tender    Images:  @ENCIMAGES @  Labs:  Lab Results  Component Value Date   HGBA1C 8.5 (H) 03/19/2024   HGBA1C 7.8 (H) 11/28/2021   HGBA1C 9.0 (H) 04/24/2021   ESRSEDRATE 59 (H) 03/19/2024  CRP 1.0 (H) 03/19/2024   REPTSTATUS 03/10/2023 FINAL 03/05/2023   REPTSTATUS 03/08/2023 FINAL 03/05/2023   GRAMSTAIN  03/05/2023    FEW WBC PRESENT,BOTH PMN AND MONONUCLEAR NO ORGANISMS SEEN    GRAMSTAIN  03/05/2023    FEW WBC PRESENT, PREDOMINANTLY PMN NO ORGANISMS SEEN    CULT  03/05/2023    NO ANAEROBES ISOLATED Performed at [facility], [address] [place], [place] [address]    CULT  03/05/2023    NO GROWTH 3 DAYS Performed at [facility], [address] [place], [place] [address]     Lab Results  Component Value Date   ALBUMIN  3.2 (L) 03/18/2024   ALBUMIN  3.8 01/23/2024   ALBUMIN  3.6 01/26/2022        Latest Ref Rng & Units 03/20/2024    4:39 AM 03/19/2024   12:11 PM 03/18/2024    9:51 PM  CBC EXTENDED  WBC 4.0 - 10.5 K/uL 7.1  6.8  9.7   RBC 3.87 - 5.11 MIL/uL 3.03  3.32  3.10   Hemoglobin 12.0 - 15.0 g/dL 8.9  89.8  9.3   HCT 63.9 - 46.0 % 28.0  31.4  28.9   Platelets 150 - 400 K/uL 311  329  326   NEUT# 1.7 - 7.7 K/uL  4.0  5.9   Lymph# 0.7 - 4.0 K/uL  1.4  2.2     Neurologic: Patient does not have protective sensation bilateral lower extremities.   MUSCULOSKELETAL:   Skin: Examination there is ischemic gangrenous changes right foot third toe.  The outline cellulitis of the right calf appears to be resolving.  Patient has a palpable dorsalis pedis pulse on the left and this is interesting in light of the CT arteriogram study showed complete blockage of the popliteal artery on the left.  Patient does not have a palpable pulse on the right foot dorsalis pedis or anterior tibial.  Hemoglobin  8.9 with a white cell count of 7.1.  Hemoglobin A1c 8.5   Sed rate 59 with a C-reactive protein of 1.  Ankle-brachial indices of the right lower extremity last year showed a great toe pressure of 84 with a monophasic posterior tibial and biphasic dorsalis pedis pulse with an ABI of 0.79.   Assessment: Assessment: Diabetic insensate neuropathy with peripheral vascular disease with osteomyelitis and gangrene right foot third toe.  Plan: Plan: I have ordered ankle-brachial indices.  Patient will need vascular surgery consultation for evaluation for endovascular intervention.  Do not feel patient would heal a third ray amputation on the right foot with her current vascular status.  I canceled the MRI scan right foot since patient just had an MRI scan of the right foot several days ago.  Thank you for the consult and the opportunity to see Ms. Espana  Jerona Sage, MD [facility] [phone] 9:30 AM

## 2024-03-20 NOTE — Progress Notes (Signed)
 VASCULAR LAB    ABI has been performed.  See CV proc for preliminary results.   [name], RVT 03/20/2024, 1:08 PM

## 2024-03-20 NOTE — Assessment & Plan Note (Signed)
 Baseline Cr 1.1, here up to 1.5 in setting of infection/SIRS. - Continue IV fluids

## 2024-03-20 NOTE — Hospital Course (Signed)
 63 y.o. F with PVD s/p CEA, hx stroke, T1DM, HTN and asthma who presented with a toe infection, found to have osteo of the 3rd toe.

## 2024-03-20 NOTE — Assessment & Plan Note (Signed)
BP normal °-Continue losartan °

## 2024-03-20 NOTE — H&P (View-Only) (Signed)
 ORTHOPAEDIC CONSULTATION  REQUESTING PHYSICIAN: Jonel Lonni SQUIBB, *  Chief Complaint: Gangrene right third toe  HPI: Michelle Nicholson is a 63 y.o. female who presents with gangrenous changes of the right third toe.  She states this has been present for about 3 weeks.  [place] antibiotics she states the cellulitis has improved.  Patient did have a CT arteriogram which stated that the left popliteal artery was closed with peripheral vascular disease bilaterally.  Patient also had an MRI scan that showed osteomyelitis of the third toe right foot.  Patient states she has a extensive family history with diabetes and peripheral vascular disease with multiple amputations within the family.  Patient states she was first diagnosed with diabetes age 30.  Past Medical History:  Diagnosis Date   Anemia    Arthritis    Arthritis of right hip 03/07/2023   Cancer (HCC)    skin ca, squamous, basal   COPD (chronic obstructive pulmonary disease) (HCC)    Diabetes mellitus    Dyspnea    Family history of adverse reaction to anesthesia    `mother - N/V, Brother N/V   Hypertension    Hypothyroidism    Labral tear of right hip joint 03/07/2023   Neuropathy    Pancreas disorder    Peripheral vascular disease (HCC)    Pneumonia    04/2021, 2003   Stroke 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, equilibrium   Past Surgical History:  Procedure Laterality Date   CARDIAC CATHETERIZATION     ENDARTERECTOMY Left 12/02/2021   Procedure: LEFT CAROTID ENDARTERECTOMY;  Surgeon: Lanis Fonda BRAVO, MD;  Location: [facility] OR;  Service: Vascular;  Laterality: Left;   PATCH ANGIOPLASTY Left 12/02/2021   Procedure: PATCH ANGIOPLASTY USING [employer] 1cm x 6cm PATCH;  Surgeon: Lanis Fonda BRAVO, MD;  Location: [facility] OR;  Service: Vascular;  Laterality: Left;   Social History   Socioeconomic History   Marital status: Widowed    Spouse name: Not on  file   Number of children: Not on file   Years of education: Not on file   Highest education level: Not on file  Occupational History   Not on file  Tobacco Use   Smoking status: Former    Current packs/day: 0.00    Types: Cigarettes    Quit date: 11/27/2021    Years since quitting: 2.3   Smokeless tobacco: Never  Vaping Use   Vaping status: Never Used  Substance and Sexual Activity   Alcohol use: No    Comment: 2 drinks a month   Drug use: No   Sexual activity: Not on file  Other Topics Concern   Not on file  Social History Narrative   Not on file   Social Drivers of Health   Financial Resource Strain: Low Risk  (01/21/2024)   Received from [facility]   Overall Financial Resource Strain (CARDIA)    Difficulty of Paying Living Expenses: Not hard at all  Food Insecurity: No Food Insecurity (03/19/2024)   Hunger Vital Sign    Worried About Running Out of Food in the Last Year: Never true    Ran Out of Food in the Last Year: Never true  Transportation Needs: No Transportation Needs (03/19/2024)   PRAPARE - Administrator, Civil Service (Medical): No    Lack of Transportation (Non-Medical): No  Physical Activity: Insufficiently Active (11/16/2019)  Received from [facility]   Exercise Vital Sign    On average, how many days per week do you engage in moderate to strenuous exercise (like a brisk walk)?: 3 days    On average, how many minutes do you engage in exercise at this level?: 20 min  Stress: No Stress Concern Present (04/09/2021)   Received from [facility] - Occupational Stress Questionnaire    Feeling of Stress : Not at all  Social Connections: Unknown (11/22/2021)   Received from [facility]   Social Network    Social Network: Not on file   Family History  Problem Relation Age of Onset   Cancer Mother    Hypertension Father    Diabetes Father    Cancer Sister     Diabetes Sister    Diabetes Brother    Kidney failure Brother    CAD Brother    Diabetes Other    - negative except otherwise stated in the family history section Allergies  Allergen Reactions   Dorzolamide Hcl-Timolol Mal Itching and Other (See Comments)    Makes her eyes burn. Conjunctival Injection with Burning. Visual Acuity Blurry   Brimonidine Rash and Other (See Comments)    Follicular conjunctivitis   Netarsudil-Latanoprost  Other (See Comments)    Redness, tearing, burning   Prior to Admission medications   Medication Sig Start Date End Date Taking? Authorizing Provider  albuterol  (VENTOLIN  HFA) 108 (90 Base) MCG/ACT inhaler Inhale 1-2 puffs into the lungs every 6 (six) hours as needed for wheezing or shortness of breath.   Yes [provider]  amLODipine  (NORVASC ) 2.5 MG tablet Take 7.5 mg by mouth every evening. 01/21/24  Yes [provider]  aspirin  325 MG tablet Take 1 tablet (325 mg total) by mouth daily. 11/30/21  Yes Danford, Lonni SQUIBB, MD  atorvastatin  (LIPITOR ) 80 MG tablet Take 80 mg by mouth in the morning.   Yes [provider]  buPROPion  (WELLBUTRIN  XL) 150 MG 24 hr tablet Take 150 mg by mouth every morning. 03/05/21  Yes [provider]  Calcium -Magnesium -Zinc (CAL-MAG-ZINC PO) Take 1 tablet by mouth every morning.   Yes [provider]  cloNIDine (CATAPRES) 0.1 MG tablet Take 0.1 mg by mouth 2 (two) times daily as needed (high BP). 01/06/24  Yes [provider]  clopidogrel  (PLAVIX ) 75 MG tablet Take 1 tablet (75 mg total) by mouth daily. 12/24/23  Yes Lanis Fonda BRAVO, MD  Continuous Glucose Sensor (FREESTYLE LIBRE 3 PLUS SENSOR) MISC Inject 1 Application into the skin once a week. 01/22/24  Yes [provider]  estradiol (ESTRACE) 0.1 MG/GM vaginal cream Place 1 Applicatorful vaginally at bedtime as needed (vaginal irritation).   Yes [provider]  fluorouracil  (EFUDEX ) 5 % cream Apply 1  application. topically daily as needed (skin cancer breakouts).   Yes [provider]  GEMTESA 75 MG TABS Take 1 tablet by mouth daily.   Yes [provider]  Glucagon (GVOKE HYPOPEN 2-PACK) 1 MG/0.2ML SOAJ Inject 1 mg into the skin as needed (hypoglycemia). 11/17/22  Yes [provider]  insulin  aspart (NOVOLOG ) 100 UNIT/ML FlexPen Inject 4 Units into the skin 3 (three) times daily with meals. Per sliding scale: Inject 4 units subcutaneously three times daily after meals - plus sliding scale adjustment - add 1 unit for every 50 units over 200   Yes [provider]  Insulin  Degludec (TRESIBA  FLEXTOUCH) 100 UNIT/ML SOPN Inject  7 Units into the skin daily. Patient taking differently: Inject 15 Units into the skin daily after breakfast. 12/10/15  Yes Samtani, Jai-Gurmukh, MD  latanoprost  (XALATAN ) 0.005 % ophthalmic solution Place 1 drop into both eyes at bedtime. 04/10/21  Yes [provider]  levothyroxine  (SYNTHROID ) 50 MCG tablet Take 50 mcg by mouth daily before breakfast.   Yes [provider]  MAGNESIUM -OXIDE 400 (240 Mg) MG tablet Take 1 tablet by mouth daily. 12/31/23  Yes [provider]  methenamine  (HIPREX ) 1 g tablet Take 1 g by mouth 2 (two) times daily. 03/05/24  Yes [provider]  Multiple Vitamin (MULTIVITAMIN WITH MINERALS) TABS tablet Take 1 tablet by mouth every morning.   Yes [provider]  NEURONTIN  300 MG capsule Take 300 mg by mouth 3 (three) times daily. 12/01/23  Yes [provider]  nicotine  (NICODERM CQ  - DOSED IN MG/24 HOURS) 14 mg/24hr patch Place 14 mg onto the skin daily. 03/18/24  Yes [provider]  nystatin  (MYCOSTATIN ) 100000 UNIT/ML suspension Take 5 mLs by mouth 4 (four) times daily as needed (throat pain). 03/18/24  Yes [provider]  Pancrelipase , Lip-Prot-Amyl, 24000-76000 units CPEP Take 2 capsules by mouth in the morning, at noon, and at bedtime.   Yes  [provider]  pantoprazole  (PROTONIX ) 40 MG tablet Take 40 mg by mouth 2 (two) times daily. 03/01/24  Yes [provider]  sertraline  (ZOLOFT ) 100 MG tablet Take 100 mg by mouth every morning.   Yes [provider]  sodium chloride  1 g tablet Take 1 g by mouth daily. 12/24/20  Yes [provider]  tacrolimus (PROTOPIC) 0.1 % ointment Apply 1 Application topically 2 (two) times daily. 12/21/23  Yes [provider]  traZODone  (DESYREL ) 100 MG tablet Take 100 mg by mouth at bedtime. 12/29/23  Yes [provider]  triamcinolone cream (KENALOG) 0.1 % Apply 1 Application topically 2 (two) times daily.   Yes [provider]  valsartan (DIOVAN) 320 MG tablet Take 320 mg by mouth daily. 12/29/23  Yes [provider]  Vitamin D, Ergocalciferol, (DRISDOL) 1.25 MG (50000 UNIT) CAPS capsule Take 50,000 Units by mouth every Sunday. 01/28/21  Yes [provider]  methazolamide  (NEPTAZANE ) 50 MG tablet Take 50 mg by mouth 2 (two) times daily.    [provider]   DG Foot Complete Right Result Date: 03/18/2024 CLINICAL DATA:  Infection, redness, swelling EXAM: RIGHT FOOT COMPLETE - 3+ VIEW COMPARISON:  None Available. FINDINGS: There is no evidence of fracture or dislocation. There is no evidence of arthropathy or other focal bone abnormality. Soft tissues are unremarkable. No bone destruction to suggest osteomyelitis. IMPRESSION: No acute bony abnormality. Electronically Signed   By: Franky Crease M.D.   On: 03/18/2024 22:11   - pertinent xrays, CT, MRI studies were reviewed and independently interpreted  Positive ROS: All other systems have been reviewed and were otherwise negative with the exception of those mentioned in the HPI and as above.  Physical Exam: General: Alert, no acute distress Psychiatric: Patient is competent for consent with normal mood and affect Lymphatic: No axillary or cervical  lymphadenopathy Cardiovascular: No pedal edema Respiratory: No cyanosis, no use of accessory musculature GI: No organomegaly, abdomen is soft and non-tender    Images:  @ENCIMAGES @  Labs:  Lab Results  Component Value Date   HGBA1C 8.5 (H) 03/19/2024   HGBA1C 7.8 (H) 11/28/2021   HGBA1C 9.0 (H) 04/24/2021   ESRSEDRATE 59 (H) 03/19/2024  CRP 1.0 (H) 03/19/2024   REPTSTATUS 03/10/2023 FINAL 03/05/2023   REPTSTATUS 03/08/2023 FINAL 03/05/2023   GRAMSTAIN  03/05/2023    FEW WBC PRESENT,BOTH PMN AND MONONUCLEAR NO ORGANISMS SEEN    GRAMSTAIN  03/05/2023    FEW WBC PRESENT, PREDOMINANTLY PMN NO ORGANISMS SEEN    CULT  03/05/2023    NO ANAEROBES ISOLATED Performed at [facility], [address] [place], [place] [address]    CULT  03/05/2023    NO GROWTH 3 DAYS Performed at [facility], [address] [place], [place] [address]     Lab Results  Component Value Date   ALBUMIN  3.2 (L) 03/18/2024   ALBUMIN  3.8 01/23/2024   ALBUMIN  3.6 01/26/2022        Latest Ref Rng & Units 03/20/2024    4:39 AM 03/19/2024   12:11 PM 03/18/2024    9:51 PM  CBC EXTENDED  WBC 4.0 - 10.5 K/uL 7.1  6.8  9.7   RBC 3.87 - 5.11 MIL/uL 3.03  3.32  3.10   Hemoglobin 12.0 - 15.0 g/dL 8.9  89.8  9.3   HCT 63.9 - 46.0 % 28.0  31.4  28.9   Platelets 150 - 400 K/uL 311  329  326   NEUT# 1.7 - 7.7 K/uL  4.0  5.9   Lymph# 0.7 - 4.0 K/uL  1.4  2.2     Neurologic: Patient does not have protective sensation bilateral lower extremities.   MUSCULOSKELETAL:   Skin: Examination there is ischemic gangrenous changes right foot third toe.  The outline cellulitis of the right calf appears to be resolving.  Patient has a palpable dorsalis pedis pulse on the left and this is interesting in light of the CT arteriogram study showed complete blockage of the popliteal artery on the left.  Patient does not have a palpable pulse on the right foot dorsalis pedis or anterior tibial.  Hemoglobin  8.9 with a white cell count of 7.1.  Hemoglobin A1c 8.5   Sed rate 59 with a C-reactive protein of 1.  Ankle-brachial indices of the right lower extremity last year showed a great toe pressure of 84 with a monophasic posterior tibial and biphasic dorsalis pedis pulse with an ABI of 0.79.   Assessment: Assessment: Diabetic insensate neuropathy with peripheral vascular disease with osteomyelitis and gangrene right foot third toe.  Plan: Plan: I have ordered ankle-brachial indices.  Patient will need vascular surgery consultation for evaluation for endovascular intervention.  Do not feel patient would heal a third ray amputation on the right foot with her current vascular status.  I canceled the MRI scan right foot since patient just had an MRI scan of the right foot several days ago.  Thank you for the consult and the opportunity to see Ms. Espana  Jerona Sage, MD [facility] [phone] 9:30 AM

## 2024-03-20 NOTE — Progress Notes (Signed)
  Progress Note   Patient: Michelle Nicholson FMW:[id number] DOB: 09-15-1960 DOA: 03/18/2024     1 DOS: the patient was seen and examined on 03/20/2024 at 12:17PM      Brief hospital course: 63 y.o. F with PVD s/p CEA, hx stroke, T1DM, HTN and asthma who presented with a toe infection, found to have osteo of the 3rd toe.     Assessment and Plan: * Osteomyelitis of third toe of right foot (HCC) - Consult Ortho - ABIs - If abnormal, will consult vascular surgery - Continue IV antibiotics - Hold Plavix  for possible surgery  AKI (acute kidney injury) (HCC) Baseline Cr 1.1, here up to 1.5 in setting of infection/SIRS. - Continue IV fluids  Uncontrolled diabetes mellitus with hyperglycemia, with long-term current use of insulin  (HCC) Glucoses labile - Continue SS corrections - Continue glargine  Essential hypertension BP normal - Continue losartan   Dyslipidemia - Continue Lipitor   Hypothyroidism - Continue levothyorinxe  COPD (chronic obstructive pulmonary disease) (HCC) - Continue ICS/LABA  PAD (peripheral artery disease) (HCC) - Continue Lipitor , aspirin           Subjective: No new complaints.  No fever.  Has pain in her foot, but was knocked out with oxycodone .  Ortho saw, referred for ABIs     Physical Exam: BP (!) 133/38 (BP Location: Left Arm)   Pulse 61   Temp 98.3 F (36.8 C) (Oral)   Resp 15   Ht 5' 8 (1.727 m)   Wt 70 kg   SpO2 98%   BMI 23.46 kg/m   Adult female, lying in bed, interactive and appropriate RRR, no murmurs, no peripheral edema Respiratory rate normal, lungs clear without wheeze or wheezes Abdomen soft, no tenderness palpation or guarding, no ascites or distention - Normal, affect normal, judgment and insight appear normal    Data Reviewed: Basic metabolic panel shows creatinine down to 1.3 CBC shows hemoglobin down to 8.9  Family Communication:     Disposition: Status is: Inpatient         Author: Lonni SHAUNNA Dalton, MD 03/20/2024 5:02 PM  For on call review [email or web address].

## 2024-03-20 NOTE — Assessment & Plan Note (Signed)
 Continue Lipitor, aspirin

## 2024-03-20 NOTE — Assessment & Plan Note (Addendum)
-   Consult Ortho - ABIs - If abnormal, will consult vascular surgery - Continue IV antibiotics - Hold Plavix  for possible surgery

## 2024-03-20 NOTE — Assessment & Plan Note (Signed)
-  Continue levothyroxine 

## 2024-03-20 NOTE — Plan of Care (Signed)

## 2024-03-21 DIAGNOSIS — M869 Osteomyelitis, unspecified: Secondary | ICD-10-CM | POA: Diagnosis not present

## 2024-03-21 DIAGNOSIS — Z95820 Peripheral vascular angioplasty status with implants and grafts: Secondary | ICD-10-CM

## 2024-03-21 LAB — GLUCOSE, CAPILLARY
Glucose-Capillary: 193 mg/dL — ABNORMAL HIGH (ref 70–99)
Glucose-Capillary: 205 mg/dL — ABNORMAL HIGH (ref 70–99)
Glucose-Capillary: 255 mg/dL — ABNORMAL HIGH (ref 70–99)
Glucose-Capillary: 276 mg/dL — ABNORMAL HIGH (ref 70–99)
Glucose-Capillary: 233 mg/dL — ABNORMAL HIGH (ref 70–99)

## 2024-03-21 MED ORDER — LACTATED RINGERS IV SOLN: INTRAVENOUS | Status: DC

## 2024-03-21 NOTE — Consult Note (Cosign Needed Addendum)
 Hospital Consult    Reason for Consult:  gangrene right 3rd toe Requesting Physician:  Jonel MRN #:  [id number]  History of Present Illness: This is a 63 y.o. female who presented to the hospital with right great toe wound.  She states that she went to the beach on a Wednesday and on Thursday morning, her toe was red and painful.  She went to the ER and ended up hospitalized in [facility] for 10 days with IV abx.  She wanted to come home to have any interventions needed.  She is followed in [facility], however, Dr. Harden was highly recommended and therefore, she came to [facility] for further evaluation.  She states she has not had any claudication but having some pain around her ankle.  She states it is better up walking and gets worse with rest.   She states that she quit smoking a couple of weeks ago.  She does have some right hip pain that radiates down her leg.  She is compliant with her asa and plavix  and statin.  She had appt in [facility] with PA last week, however, she was hospitalized.  She returned home and went to diabetic podiatrist and then sent to ER.   She states that there is strong family hx of DM and vascular disease with her father being diabetic and double amputee as well as other family members with transplants and hx of amputations.   She has hx of left CEA 12/02/2021 by Dr. Lanis.  She has hx of left common iliac artery and popliteal artery angioplasty by Dr. Jerome on 04/03/2020.  She is followed by Vascular Surgery in [facility].  She was last seen 09/21/2023 and at that time, she was still smoking.  She was having pain in the RLE and hip that would extend down her right leg.  She also has hx of neuropathy.  Her ABI on the right was 0.77 and toe pressure on the right was 96.  She had monophasic waveforms in the tibial vessels and multiphasic waveforms to the popliteal artery on the right.   She did not have any tissue loss at that time.   She was encouraged to stop smoking and  walk as much as possible.  She was scheduled to f/u in 6 months unless she developed rest pain, tissue loss or lifestyle limiting claudication.  She continues to follow with Dr. Lanis for her carotid stenosis and has an appt in October for duplex.  She has not had any amaurosis fugax, speech issues.  She states that she has always had some right sided weakness since stroke.  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   The pt is on a statin for cholesterol management.  The pt is on a daily aspirin .   Other AC:  Plavix  The pt is on CCB, ARB for hypertension PTA The pt is  on medication for diabetes PTA. Tobacco hx:  current  Past Medical History:  Diagnosis Date   Anemia    Arthritis    Arthritis of right hip 03/07/2023   Cancer (HCC)    skin ca, squamous, basal   COPD (chronic obstructive pulmonary disease) (HCC)    Diabetes mellitus    Dyspnea    Family history of adverse reaction to anesthesia    `mother - N/V, Brother N/V   Hypertension    Hypothyroidism    Labral tear of right hip joint 03/07/2023   Neuropathy    Pancreas disorder  Peripheral vascular disease (HCC)    Pneumonia    04/2021, 2003   Stroke [facility]) 2017   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, equilibrium    Past Surgical History:  Procedure Laterality Date   CARDIAC CATHETERIZATION     ENDARTERECTOMY Left 12/02/2021   Procedure: LEFT CAROTID ENDARTERECTOMY;  Surgeon: Lanis Fonda BRAVO, MD;  Location: [facility] OR;  Service: Vascular;  Laterality: Left;   PATCH ANGIOPLASTY Left 12/02/2021   Procedure: PATCH ANGIOPLASTY USING [employer] 1cm x 6cm PATCH;  Surgeon: Lanis Fonda BRAVO, MD;  Location: [facility] OR;  Service: Vascular;  Laterality: Left;    Allergies  Allergen Reactions   Dorzolamide Hcl-Timolol Mal Itching and Other (See Comments)    Makes her eyes burn. Conjunctival Injection with Burning. Visual Acuity Blurry   Brimonidine Rash and Other (See Comments)    Follicular conjunctivitis   Netarsudil-Latanoprost   Other (See Comments)    Redness, tearing, burning    Prior to Admission medications   Medication Sig Start Date End Date Taking? Authorizing Provider  albuterol  (VENTOLIN  HFA) 108 (90 Base) MCG/ACT inhaler Inhale 1-2 puffs into the lungs every 6 (six) hours as needed for wheezing or shortness of breath.   Yes [provider]  amLODipine  (NORVASC ) 2.5 MG tablet Take 7.5 mg by mouth every evening. 01/21/24  Yes [provider]  aspirin  325 MG tablet Take 1 tablet (325 mg total) by mouth daily. 11/30/21  Yes Danford, Lonni SQUIBB, MD  atorvastatin  (LIPITOR ) 80 MG tablet Take 80 mg by mouth in the morning.   Yes [provider]  buPROPion  (WELLBUTRIN  XL) 150 MG 24 hr tablet Take 150 mg by mouth every morning. 03/05/21  Yes [provider]  Calcium -Magnesium -Zinc (CAL-MAG-ZINC PO) Take 1 tablet by mouth every morning.   Yes [provider]  cloNIDine (CATAPRES) 0.1 MG tablet Take 0.1 mg by mouth 2 (two) times daily as needed (high BP). 01/06/24  Yes [provider]  clopidogrel  (PLAVIX ) 75 MG tablet Take 1 tablet (75 mg total) by mouth daily. 12/24/23  Yes Lanis Fonda BRAVO, MD  Continuous Glucose Sensor (FREESTYLE LIBRE 3 PLUS SENSOR) MISC Inject 1 Application into the skin once a week. 01/22/24  Yes [provider]  estradiol (ESTRACE) 0.1 MG/GM vaginal cream Place 1 Applicatorful vaginally at bedtime as needed (vaginal irritation).   Yes [provider]  fluorouracil  (EFUDEX ) 5 % cream Apply 1 application. topically daily as needed (skin cancer breakouts).   Yes [provider]  GEMTESA 75 MG TABS Take 1 tablet by mouth daily.   Yes [provider]  Glucagon (GVOKE HYPOPEN 2-PACK) 1 MG/0.2ML SOAJ Inject 1 mg into the skin as needed (hypoglycemia). 11/17/22  Yes [provider]  insulin  aspart (NOVOLOG ) 100 UNIT/ML FlexPen Inject 4 Units into the skin 3 (three) times daily with meals. Per sliding scale: Inject  4 units subcutaneously three times daily after meals - plus sliding scale adjustment - add 1 unit for every 50 units over 200   Yes [provider]  Insulin  Degludec (TRESIBA  FLEXTOUCH) 100 UNIT/ML SOPN Inject 7 Units into the skin daily. Patient taking differently: Inject 15 Units into the skin daily after breakfast. 12/10/15  Yes Samtani, Jai-Gurmukh, MD  latanoprost  (XALATAN ) 0.005 % ophthalmic solution Place 1 drop into both eyes at bedtime. 04/10/21  Yes [provider]  levothyroxine  (SYNTHROID ) 50 MCG tablet Take 50 mcg by mouth daily before breakfast.   Yes [provider]  MAGNESIUM -OXIDE 400 (240 Mg) MG  tablet Take 1 tablet by mouth daily. 12/31/23  Yes [provider]  methenamine  (HIPREX ) 1 g tablet Take 1 g by mouth 2 (two) times daily. 03/05/24  Yes [provider]  Multiple Vitamin (MULTIVITAMIN WITH MINERALS) TABS tablet Take 1 tablet by mouth every morning.   Yes [provider]  NEURONTIN  300 MG capsule Take 300 mg by mouth 3 (three) times daily. 12/01/23  Yes [provider]  nicotine  (NICODERM CQ  - DOSED IN MG/24 HOURS) 14 mg/24hr patch Place 14 mg onto the skin daily. 03/18/24  Yes [provider]  nystatin  (MYCOSTATIN ) 100000 UNIT/ML suspension Take 5 mLs by mouth 4 (four) times daily as needed (throat pain). 03/18/24  Yes [provider]  Pancrelipase , Lip-Prot-Amyl, 24000-76000 units CPEP Take 2 capsules by mouth in the morning, at noon, and at bedtime.   Yes [provider]  pantoprazole  (PROTONIX ) 40 MG tablet Take 40 mg by mouth 2 (two) times daily. 03/01/24  Yes [provider]  sertraline  (ZOLOFT ) 100 MG tablet Take 100 mg by mouth every morning.   Yes [provider]  sodium chloride  1 g tablet Take 1 g by mouth daily. 12/24/20  Yes [provider]  tacrolimus (PROTOPIC) 0.1 % ointment Apply 1 Application topically 2 (two) times daily. 12/21/23  Yes [provider]  traZODone  (DESYREL ) 100 MG tablet Take 100 mg by mouth at bedtime. 12/29/23  Yes [provider]  triamcinolone cream (KENALOG) 0.1 % Apply 1 Application topically 2 (two) times daily.   Yes [provider]  valsartan (DIOVAN) 320 MG tablet Take 320 mg by mouth daily. 12/29/23  Yes [provider]  Vitamin D, Ergocalciferol, (DRISDOL) 1.25 MG (50000 UNIT) CAPS capsule Take 50,000 Units by mouth every Sunday. 01/28/21  Yes [provider]  methazolamide  (NEPTAZANE ) 50 MG tablet Take 50 mg by mouth 2 (two) times daily.    [provider]    Social History   Socioeconomic History   Marital status: Widowed    Spouse name: Not on file   Number of children: Not on file   Years of education: Not on file   Highest education level: Not on file  Occupational History   Not on file  Tobacco Use   Smoking status: Former    Current packs/day: 0.00    Types: Cigarettes    Quit date: 11/27/2021    Years since quitting: 2.3   Smokeless tobacco: Never  Vaping Use   Vaping status: Never Used  Substance and Sexual Activity   Alcohol use: No    Comment: 2 drinks a month   Drug use: No   Sexual activity: Not on file  Other Topics Concern   Not on file  Social History Narrative   Not on file   Social Drivers of Health   Financial Resource Strain: Low Risk  (01/21/2024)   Received from [facility]   Overall Financial Resource Strain (CARDIA)    Difficulty of Paying Living Expenses: Not hard at all  Food Insecurity: No Food Insecurity (03/19/2024)   Hunger Vital Sign    Worried About Running Out of Food in the Last Year: Never true    Ran Out of Food in the Last Year: Never true  Transportation Needs: No Transportation Needs (03/19/2024)   PRAPARE - Administrator, Civil Service (Medical): No    Lack of Transportation (Non-Medical): No  Physical Activity: Insufficiently Active (11/16/2019)   Received from [facility]   Exercise Vital Sign    On average, how many days per week do you engage in moderate to strenuous exercise (like a brisk walk)?: 3 days    On average, how many minutes do you engage in exercise at this level?: 20 min  Stress: No Stress Concern Present (04/09/2021)   Received from [facility] of Occupational Health - Occupational Stress Questionnaire    Feeling of Stress : Not at all  Social Connections: Unknown (11/22/2021)   Received from [facility]   Social Network    Social Network: Not on file  Intimate Partner Violence: Not At Risk (03/19/2024)   Humiliation, Afraid, Rape, and Kick questionnaire    Fear of Current or Ex-Partner: No    Emotionally Abused: No    Physically Abused: No    Sexually Abused: No     Family History  Problem Relation Age of Onset   Cancer Mother    Hypertension Father    Diabetes Father    Cancer Sister    Diabetes Sister    Diabetes Brother    Kidney failure Brother    CAD Brother    Diabetes Other     ROS: [x]  Positive   [ ]  Negative   [ ]  All sytems reviewed and are negative  Cardiac: []  chest pain/pressure []  hx MI []  SOB   Vascular: []  pain in legs while walking []  pain in legs at rest []  pain in legs at night [x]  non-healing ulcers []  hx of DVT []  swelling in legs  Pulmonary: [x]  COPD   Neurologic: [x]  hx of CVA  [x]  neuropathy  Hematologic: [x]  hx of cancer-skin  Endocrine:   [x]  diabetes [x]  thyroid  disease  GI [x]  GERD  GU: []  CKD/renal failure []  HD--[]  M/W/F or []  T/T/S  Psychiatric: []  anxiety []  depression  Musculoskeletal: [x]  arthritis []  joint pain  Integumentary: []  rashes [x]  ulcers  Constitutional: []  fever  []  chills  Physical Examination  Vitals:   03/21/24 0634 03/21/24 0743  BP: (!) 169/59   Pulse: 62 62  Resp: 18 18  Temp: 98 F (36.7 C)   SpO2: 95%    Body mass index is 23.46 kg/m.  General:  WDWN in NAD Gait: Not  observed HENT: WNL, normocephalic Pulmonary: normal non-labored breathing Cardiac: regular Abdomen:  soft, NT; aortic pulse is not palpable Skin: without rashes Vascular Exam/Pulses: Bilateral radial and femoral pulses are palpable.  She has a palpable left DP pulse.  Her right pedal pulses are not palpable.  Extremities: right 3rd toe gangrenous changes with osteomyelitis     Musculoskeletal: no muscle wasting or atrophy  Neurologic: A&O X 3 Psychiatric:  The pt has Normal affect.   CBC    Component Value Date/Time   WBC 7.1 03/20/2024 0439   RBC 3.03 (L) 03/20/2024 0439   HGB 8.9 (L) 03/20/2024 0439   HCT 28.0 (L) 03/20/2024 0439   PLT 311 03/20/2024 0439   MCV 92.4 03/20/2024 0439   MCH 29.4 03/20/2024 0439   MCHC 31.8 03/20/2024 0439   RDW 13.9 03/20/2024 0439   LYMPHSABS 1.4 03/19/2024 1211   MONOABS 0.7 03/19/2024 1211   EOSABS 0.3 03/19/2024 1211   BASOSABS 0.1 03/19/2024 1211    BMET    Component Value Date/Time   NA 135 03/20/2024 0439   K 4.7 03/20/2024 0439   CL 104 03/20/2024 0439   CO2 19 (L) 03/20/2024 0439   GLUCOSE 233 (  H) 03/20/2024 0439   BUN 21 03/20/2024 0439   CREATININE 1.36 (H) 03/20/2024 0439   CALCIUM  8.4 (L) 03/20/2024 0439   GFRNONAA 43 (L) 03/20/2024 0439   GFRAA >60 04/02/2019 2338    COAGS: Lab Results  Component Value Date   INR 1.0 12/02/2021   INR 1.2 04/21/2021     Non-Invasive Vascular Imaging:   ABI 03/20/2024 +---------+------------------+-----+-------------------+--------+  Right   Rt Pressure (mmHg)IndexWaveform           Comment   +---------+------------------+-----+-------------------+--------+  Brachial 186                    multiphasic                  +---------+------------------+-----+-------------------+--------+  PTA     75                0.40 dampened monophasic          +---------+------------------+-----+-------------------+--------+  DP      95                0.51 monophasic                    +---------+------------------+-----+-------------------+--------+  Great Toe51                0.27 Abnormal                     +---------+------------------+-----+-------------------+--------+   +---------+------------------+-----+-----------+-------+  Left    Lt Pressure (mmHg)IndexWaveform   Comment  +---------+------------------+-----+-----------+-------+  Brachial 174                    multiphasic         +---------+------------------+-----+-----------+-------+  PTA     163               0.88 biphasic            +---------+------------------+-----+-----------+-------+  DP      173               0.93 biphasic            +---------+------------------+-----+-----------+-------+  Great Toe136               0.73 Normal              +---------+------------------+-----+-----------+-------+   +-------+-----------+-----------+------------+------------+  ABI/TBIToday's ABIToday's TBIPrevious ABIPrevious TBI  +-------+-----------+-----------+------------+------------+  Right 0.51       0.27       0.79        0.44          +-------+-----------+-----------+------------+------------+  Left  0.93       0.73       1.05        0.88          +-------+-----------+-----------+------------+------------+     ASSESSMENT/PLAN: This is a 63 y.o. female with right 3rd toe gangrenous changes with osteomyelitis.   CLI with tissue loss -pt with hx of vascular disease as in HPI-she has right 3rd toe osteomyelitis.  She has ABI of 0.51, which is decreased from March and her toe pressure is 51.  She will need angiogram to evaluate her arterial blood flow.  Discussed with pt that her blood flow needs to be maximized for best chance of healing amputation.  I did discuss with her that even with that, there is chance of non healing and needing more proximal amputation.   -encouraged pt to be out of bed  as she would rather be out of bed as she is  a very active person.  -continue asa/plavix /statin  Carotid stenosis -she has f/u in our office next month with carotid duplex.  She has remained asymptomatic.  Her last carotid duplex 11/07/2022 was 1-39% bilateral ICA stenosis.   Smoker -pt stopped smoking 2 weeks ago.  We had a frank discussion about smoking especially in light of having diabetes and her strong family hx that the combination puts her at higher risk of limb amputation, heart attack, strokes.  It also puts her at risk for cancers and pulmonary issues.  She expressed that she does not want to smoke anymore especially with so many family members with amputations and transplants.   -Dr. Lanis to evaluate pt and determine further plan   Lucie Apt, PA-C [facility] [phone]  VASCULAR STAFF ADDENDUM: I have independently interviewed and examined the patient. I agree with the above.  Patient seen and examined at bedside.  She is well-known to my service having previously undergone carotid endarterectomy for symptomatic ICA stenosis. She presents with right sided critical and ischemia with tissue loss.  This is followed in [facility] by another vascular surgeon, Dr. Jerome. Patient with nonpalpable pulses in the foot, toe pressure is inadequate for wound healing.  After discussing the risks and benefits of right lower extremity angiogram with possible intervention, [place] elected to proceed.  This will take place Wednesday.  Please make n.p.o. midnight.  Fonda FORBES Lanis MD [facility] Phone Number: [phone] 03/22/2024 8:04 AM

## 2024-03-21 NOTE — Progress Notes (Signed)
  Progress Note   Patient: Michelle Nicholson FMW:[id number] DOB: October 12, 1960 DOA: 03/18/2024     2 DOS: the patient was seen and examined on 03/21/2024 at 8:24AM      Brief hospital course: 63 y.o. F with PVD s/p CEA, hx stroke, T1DM, HTN and asthma who presented with a toe infection, found to have osteo of the 3rd toe.     Assessment and Plan: * Osteomyelitis of third toe of right foot [facility]) Admitted and ortho consulted.  ABI abnormal, vascular consulted. - Continue Rocephin  and vancomycin  - Ortho and vascular consulted - Hold Plavix  for possible surgery   PAD (peripheral artery disease) (HCC) Chronic limb threatening ischemia Dyslipidemia - Consult Vascular - Continue atorvastatin , aspirin  - Hold Plavix  re surgery - Stop smoking -Nicotine  patch   AKI (acute kidney injury) (HCC) Baseline Cr 1.1, here up to 1.5 in setting of infection/SIRS. - IV fluids - Trend Cr  Uncontrolled diabetes mellitus with hyperglycemia, with long-term current use of insulin  (HCC) Glucose mostly controlled - Continue glargine - Continue SS corrections   Essential hypertension BP elevated - Continue losartan   Hypothyroidism - Continue LT4  COPD (chronic obstructive pulmonary disease) (HCC) No active flare - Continue ICS LABA   Mood disorder - Continue sertraline   Pancreatic insufficiency - Continue Creon   Normocytic anemia Suspect this is anemia of chronic disease - Check iron stores, B12 and folate       Subjective: No new fever, no confusion, no respiratory symptoms, no chest pain.  Denies claudication symptoms in the right.     Physical Exam: BP (!) 153/63 (BP Location: Left Arm)   Pulse 63   Temp 98 F (36.7 C)   Resp 17   Ht 5' 8 (1.727 m)   Wt 70 kg   SpO2 96%   BMI 23.46 kg/m   Elderly adult female, sitting up in bed, interactive and appropriate, reading a book RRR, no murmurs, no peripheral edema Respiratory normal, lungs clear without rales or  wheezes Abdomen soft no tenderness palpation or guarding, no ascites or distention Attention normal, affect appropriate, judgment and insight appear normal   Data Reviewed: Discussed with vascular surgery ABI results reviewed   Family Communication:     Disposition: Status is: Inpatient         Author: Lonni SHAUNNA Dalton, MD 03/21/2024 1:54 PM  For on call review [email or web address].

## 2024-03-22 DIAGNOSIS — M869 Osteomyelitis, unspecified: Secondary | ICD-10-CM | POA: Diagnosis not present

## 2024-03-22 LAB — COMPREHENSIVE METABOLIC PANEL WITH GFR
ALT: 8 U/L (ref 0–44)
AST: 18 U/L (ref 15–41)
Albumin: 3 g/dL — ABNORMAL LOW (ref 3.5–5.0)
Alkaline Phosphatase: 89 U/L (ref 38–126)
Anion gap: 10 (ref 5–15)
BUN: 28 mg/dL — ABNORMAL HIGH (ref 8–23)
CO2: 21 mmol/L — ABNORMAL LOW (ref 22–32)
Calcium: 8.9 mg/dL (ref 8.9–10.3)
Chloride: 102 mmol/L (ref 98–111)
Creatinine, Ser: 1.43 mg/dL — ABNORMAL HIGH (ref 0.44–1.00)
GFR, Estimated: 41 mL/min — ABNORMAL LOW (ref >60)
Glucose, Bld: 269 mg/dL — ABNORMAL HIGH (ref 70–99)
Potassium: 4.1 mmol/L (ref 3.5–5.1)
Sodium: 133 mmol/L — ABNORMAL LOW (ref 135–145)
Total Bilirubin: 0.5 mg/dL (ref 0.0–1.2)
Total Protein: 6.4 g/dL — ABNORMAL LOW (ref 6.5–8.1)

## 2024-03-22 LAB — GLUCOSE, CAPILLARY
Glucose-Capillary: 238 mg/dL — ABNORMAL HIGH (ref 70–99)
Glucose-Capillary: 219 mg/dL — ABNORMAL HIGH (ref 70–99)
Glucose-Capillary: 184 mg/dL — ABNORMAL HIGH (ref 70–99)
Glucose-Capillary: 337 mg/dL — ABNORMAL HIGH (ref 70–99)
Glucose-Capillary: 228 mg/dL — ABNORMAL HIGH (ref 70–99)

## 2024-03-22 LAB — CBC
HCT: 29.6 % — ABNORMAL LOW (ref 36.0–46.0)
Hemoglobin: 9.8 g/dL — ABNORMAL LOW (ref 12.0–15.0)
MCH: 30.1 pg (ref 26.0–34.0)
MCHC: 33.1 g/dL (ref 30.0–36.0)
MCV: 90.8 fL (ref 80.0–100.0)
Platelets: 313 K/uL (ref 150–400)
RBC: 3.26 MIL/uL — ABNORMAL LOW (ref 3.87–5.11)
RDW: 13.4 % (ref 11.5–15.5)
WBC: 7.5 K/uL (ref 4.0–10.5)
nRBC: 0 % (ref 0.0–0.2)

## 2024-03-22 LAB — VANCOMYCIN, PEAK: Vancomycin Pk: 27 ug/mL — ABNORMAL LOW (ref 30–40)

## 2024-03-22 MED ORDER — TRAZODONE HCL 50 MG PO TABS
50.0000 mg | ORAL_TABLET | Freq: Every evening | ORAL | Status: AC | PRN
  Administered 2024-03-22: 50 mg via ORAL
  Filled 2024-03-22 (×2): qty 1

## 2024-03-22 MED ORDER — INSULIN ASPART 100 UNIT/ML IJ SOLN
3.0000 [IU] | Freq: Three times a day (TID) | INTRAMUSCULAR | Status: DC
  Administered 2024-03-22 – 2024-03-26 (×6): 3 [IU] via SUBCUTANEOUS

## 2024-03-22 MED ORDER — HYDRALAZINE HCL 25 MG PO TABS
25.0000 mg | ORAL_TABLET | Freq: Three times a day (TID) | ORAL | Status: DC
  Administered 2024-03-22 – 2024-03-27 (×13): 25 mg via ORAL
  Filled 2024-03-22 (×13): qty 1

## 2024-03-22 MED ORDER — SACCHAROMYCES BOULARDII 250 MG PO CAPS
250.0000 mg | ORAL_CAPSULE | Freq: Two times a day (BID) | ORAL | Status: DC
  Administered 2024-03-22 – 2024-03-27 (×11): 250 mg via ORAL
  Filled 2024-03-22 (×11): qty 1

## 2024-03-22 MED ORDER — INSULIN GLARGINE 100 UNIT/ML ~~LOC~~ SOLN
15.0000 [IU] | Freq: Every day | SUBCUTANEOUS | Status: DC
  Administered 2024-03-22 – 2024-03-26 (×5): 15 [IU] via SUBCUTANEOUS
  Filled 2024-03-22 (×6): qty 0.15

## 2024-03-22 MED ORDER — METHENAMINE HIPPURATE 1 G PO TABS: 1.0000 g | ORAL_TABLET | Freq: Two times a day (BID) | ORAL | Status: DC

## 2024-03-22 MED ORDER — GABAPENTIN 300 MG PO CAPS
300.0000 mg | ORAL_CAPSULE | Freq: Three times a day (TID) | ORAL | Status: DC
  Administered 2024-03-22 – 2024-03-27 (×15): 300 mg via ORAL
  Filled 2024-03-22 (×15): qty 1

## 2024-03-22 MED ORDER — PROMETHAZINE (PHENERGAN) 6.25MG IN NS 50ML IVPB: 6.2500 mg | Freq: Four times a day (QID) | INTRAVENOUS | Status: DC | PRN

## 2024-03-22 MED ORDER — AMLODIPINE BESYLATE 5 MG PO TABS
7.5000 mg | ORAL_TABLET | Freq: Every evening | ORAL | Status: DC
  Administered 2024-03-22 – 2024-03-26 (×5): 7.5 mg via ORAL
  Filled 2024-03-22: qty 2
  Filled 2024-03-22: qty 1
  Filled 2024-03-22 (×3): qty 2

## 2024-03-22 MED ORDER — ONDANSETRON HCL 4 MG/2ML IJ SOLN: 4.0000 mg | Freq: Four times a day (QID) | INTRAMUSCULAR | Status: DC | PRN

## 2024-03-22 MED ORDER — PANTOPRAZOLE SODIUM 40 MG PO TBEC
40.0000 mg | DELAYED_RELEASE_TABLET | Freq: Two times a day (BID) | ORAL | Status: DC
  Administered 2024-03-22 – 2024-03-27 (×11): 40 mg via ORAL
  Filled 2024-03-22 (×11): qty 1

## 2024-03-22 MED ORDER — POLYETHYLENE GLYCOL 3350 17 G PO PACK
17.0000 g | PACK | Freq: Every day | ORAL | Status: DC | PRN
  Administered 2024-03-22: 17 g via ORAL
  Filled 2024-03-22: qty 1

## 2024-03-22 NOTE — Progress Notes (Signed)
  Progress Note   Patient: Michelle Nicholson FMW:[id number] DOB: [birth year] DOA: 03/18/2024     3 DOS: the patient was seen and examined on 03/22/2024 at 11:40 AM      Brief hospital course: 63 y.o. F with PVD s/p CEA, hx stroke, T1DM, HTN and asthma who presented with a toe infection, found to have osteo of the 3rd toe.     Assessment and Plan: * Osteomyelitis of third toe of right foot (HCC) Admitted and orthopedics and obtain ABI.  This was abnormal, so vascular surgery consulted. -Continue Rocephin  and vancomycin  - Plan for angiography with vascular surgery tomorrow -Depending on outcome, orthopedics may take to the OR on Friday - Hold Plavix  for possible surgery     PAD (peripheral artery disease) (HCC) Chronic limb threatening ischemia Hyperlipidemia -Angiography tomorrow - Continue aspirin , Lipitor  - Hold Plavix  pending surgery    AKI (acute kidney injury) (HCC) Baseline Cr 1.1, here up to 1.5 in setting of infection/SIRS.  No improvement with IV fluids so far - Hold losartan  -Push oral fluids - Trend Cr   Essential hypertension Blood pressure quite elevated - Continue amlodipine  - Replace losartan  with hydralazine    Uncontrolled diabetes mellitus with hyperglycemia, with long-term current use of insulin  (HCC) Peripheral neuropathy Glucoses remain elevated - Continue glargine at increased dose - Continue sliding scale corrections - Add mealtime insulin  -Resume home gabapentin    Normocytic anemia No bleeding observed or reported - Trend hemoglobin postop  Dyslipidemia -Continue Lipitor   Hypothyroidism -Continue levothyroxine   COPD (chronic obstructive pulmonary disease) (HCC) No evidence of flare - Continue ICS/LABA  Mood disorder - Continue bupropion , sertraline , trazodone   Pancreatic insufficiency - Continue Creon   Smoking Smoking cessation recommended - Continue nicotine  patch  Thrush - Continue nystatin  swish and  swallow  Hyponatremia Mild, asymptomatic - caution with fluids after surgery - Trend BMP       Subjective: Patient's pain is reasonably well-controlled.  No fever, no headache.     Physical Exam: BP (!) 169/58 (BP Location: Left Arm)   Pulse 65   Temp 98 F (36.7 C)   Resp 15   Ht 5' 8 (1.727 m)   Wt 70 kg   SpO2 95%   BMI 23.46 kg/m   Adult female, lying in bed, interactive and appropriate RRR, no murmurs, no peripheral edema Respiratory rate normal, lungs clear without rales or wheezes Abdomen soft, no tenderness palpation or guarding, no ascites or distention Attention normal, affect normal, judgment and insight are normal, face symmetric, speech fluent    Data Reviewed: Basic metabolic panel shows mild hyponatremia, creatinine up to 1.4 CBC shows mild anemia     Family Communication:      Disposition: Status is: Inpatient 63 year old F with diabetic foot infection, vascular disease  Angiography on Wednesday, pending results, may have surgery later this week.        Author: Lonni SHAUNNA Dalton, MD 03/22/2024 2:33 PM  For on call review [email or web address].

## 2024-03-22 NOTE — Progress Notes (Addendum)
 [facility]  Subjective  - gangrene right 3rd toe without change   Objective (!) 163/64 68 98 F (36.7 C) (Oral) 17 100%  Intake/Output Summary (Last 24 hours) at 03/22/2024 0834 Last data filed at 03/21/2024 1700 Gross per 24 hour  Intake 840 ml  Output --  Net 840 ml    No pedal pulses right foot, palpable femoral pulse B Right third toe wound Lungs non labored breathing    Assessment/Planning: Right third toe gangrenous changes with osteomyelitis   Decreased ABI's on the right with monophasic waveforms and ABI of 0.51 Plan for angiogram tomorrow and possible intervention once she is maximally vascularized we will discuss plan for toe amputation pending inflow and healing level of her arterial blood flow.   Thx for seeing her VVS  Currently on IV antibiotics Rocephin  and Vanc.  Maurilio Deland Collet 03/22/2024 8:34 AM --  Laboratory Lab Results: Recent Labs    03/20/24 0439 03/22/24 0442  WBC 7.1 7.5  HGB 8.9* 9.8*  HCT 28.0* 29.6*  PLT 311 313   BMET Recent Labs    03/20/24 0439 03/22/24 0442  NA 135 133*  K 4.7 4.1  CL 104 102  CO2 19* 21*  GLUCOSE 233* 269*  BUN 21 28*  CREATININE 1.36* 1.43*  CALCIUM  8.4* 8.9    COAG Lab Results  Component Value Date   INR 1.0 12/02/2021   INR 1.2 04/21/2021   No results found for: PTT

## 2024-03-22 NOTE — Inpatient Diabetes Management (Signed)
 Inpatient Diabetes Program Recommendations  AACE/ADA: New Consensus Statement on Inpatient Glycemic Control (2015)  Target Ranges:  Prepandial:   less than 140 mg/dL      Peak postprandial:   less than 180 mg/dL (1-2 hours)      Critically ill patients:  140 - 180 mg/dL   Lab Results  Component Value Date   GLUCAP 219 (H) 03/22/2024   HGBA1C 8.5 (H) 03/19/2024    Review of Glycemic Control  Latest Reference Range & Units 03/21/24 06:32 03/21/24 11:32 03/21/24 16:08 03/21/24 18:32 03/21/24 21:01 03/22/24 06:27 03/22/24 08:51  Glucose-Capillary 70 - 99 mg/dL 806 (H) 794 (H) 744 (H) 276 (H) 233 (H) 238 (H) 219 (H)  (H): Data is abnormally high  Diabetes history: DM2 Outpatient Diabetes medications: Tresiba  15 units at bedtime, Novolog  4 units TID and Freestyle Libre 3 Current orders for Inpatient glycemic control: Lantus  15 units at bedtime, Novolog  0-15 units TID  Inpatient Diabetes Program Recommendations:    Noted Lantus  increased to 15 units tonight from 10 units.  Please also consider:  Novolog  3 units TID with meals if she consumes at least 50%.  Hold if NPO.    Thank you, Wyvonna Pinal, MSN, CDCES Diabetes Coordinator Inpatient Diabetes Program [phone] (team pager from 8a-5p)

## 2024-03-22 NOTE — Plan of Care (Signed)

## 2024-03-22 NOTE — Care Management Important Message (Signed)
 Important Message  Patient Details  Name: Michelle Nicholson MRN: [id number] Date of Birth: [birth year]   Important Message Given:  Yes - Medicare IM     Jon Cruel 03/22/2024, 3:59 PM

## 2024-03-23 ENCOUNTER — Encounter (HOSPITAL_COMMUNITY)
Admission: EM | Disposition: A | Discharge status: Home / Self Care | Payer: Self-pay | Source: Home / Self Care | Attending: Family Medicine

## 2024-03-23 ENCOUNTER — Inpatient Hospital Stay (HOSPITAL_COMMUNITY)

## 2024-03-23 DIAGNOSIS — M869 Osteomyelitis, unspecified: Secondary | ICD-10-CM | POA: Diagnosis not present

## 2024-03-23 DIAGNOSIS — I70261 Atherosclerosis of native arteries of extremities with gangrene, right leg: Secondary | ICD-10-CM

## 2024-03-23 HISTORY — PX: LOWER EXTREMITY INTERVENTION: CATH118252

## 2024-03-23 HISTORY — PX: LOWER EXTREMITY ANGIOGRAPHY: CATH118251

## 2024-03-23 HISTORY — PX: ABDOMINAL AORTOGRAM: CATH118222

## 2024-03-23 LAB — BASIC METABOLIC PANEL WITH GFR
Anion gap: 11 (ref 5–15)
BUN: 28 mg/dL — ABNORMAL HIGH (ref 8–23)
CO2: 21 mmol/L — ABNORMAL LOW (ref 22–32)
Calcium: 8.6 mg/dL — ABNORMAL LOW (ref 8.9–10.3)
Chloride: 100 mmol/L (ref 98–111)
Creatinine, Ser: 1.4 mg/dL — ABNORMAL HIGH (ref 0.44–1.00)
GFR, Estimated: 42 mL/min — ABNORMAL LOW (ref >60)
Glucose, Bld: 320 mg/dL — ABNORMAL HIGH (ref 70–99)
Potassium: 4 mmol/L (ref 3.5–5.1)
Sodium: 132 mmol/L — ABNORMAL LOW (ref 135–145)

## 2024-03-23 LAB — GLUCOSE, CAPILLARY
Glucose-Capillary: 292 mg/dL — ABNORMAL HIGH (ref 70–99)
Glucose-Capillary: 111 mg/dL — ABNORMAL HIGH (ref 70–99)
Glucose-Capillary: 79 mg/dL (ref 70–99)
Glucose-Capillary: 107 mg/dL — ABNORMAL HIGH (ref 70–99)
Glucose-Capillary: 65 mg/dL — ABNORMAL LOW (ref 70–99)
Glucose-Capillary: 140 mg/dL — ABNORMAL HIGH (ref 70–99)
Glucose-Capillary: 156 mg/dL — ABNORMAL HIGH (ref 70–99)
Glucose-Capillary: 305 mg/dL — ABNORMAL HIGH (ref 70–99)

## 2024-03-23 LAB — CBC
HCT: 28.9 % — ABNORMAL LOW (ref 36.0–46.0)
Hemoglobin: 9.6 g/dL — ABNORMAL LOW (ref 12.0–15.0)
MCH: 30.2 pg (ref 26.0–34.0)
MCHC: 33.2 g/dL (ref 30.0–36.0)
MCV: 90.9 fL (ref 80.0–100.0)
Platelets: 293 K/uL (ref 150–400)
RBC: 3.18 MIL/uL — ABNORMAL LOW (ref 3.87–5.11)
RDW: 13.5 % (ref 11.5–15.5)
WBC: 7.8 K/uL (ref 4.0–10.5)
nRBC: 0 % (ref 0.0–0.2)

## 2024-03-23 LAB — TROPONIN I (HIGH SENSITIVITY): Troponin I (High Sensitivity): 4 ng/L (ref <18)

## 2024-03-23 LAB — VANCOMYCIN, TROUGH: Vancomycin Tr: 15 ug/mL (ref 15–20)

## 2024-03-23 MED ORDER — LABETALOL HCL 5 MG/ML IV SOLN
INTRAVENOUS | Status: DC | PRN
  Administered 2024-03-23: 10 mg via INTRAVENOUS

## 2024-03-23 MED ORDER — GLUCOSE 4 G PO CHEW
CHEWABLE_TABLET | ORAL | Status: AC
  Filled 2024-03-23: qty 1

## 2024-03-23 MED ORDER — SODIUM CHLORIDE 0.9 % IV SOLN: 250.0000 mL | INTRAVENOUS | Status: AC | PRN

## 2024-03-23 MED ORDER — HYDRALAZINE HCL 20 MG/ML IJ SOLN
INTRAMUSCULAR | Status: DC | PRN
  Administered 2024-03-23: 10 mg via INTRAVENOUS

## 2024-03-23 MED ORDER — LABETALOL HCL 5 MG/ML IV SOLN
INTRAVENOUS | Status: AC
  Filled 2024-03-23: qty 4

## 2024-03-23 MED ORDER — HEPARIN (PORCINE) IN NACL 2000-0.9 UNIT/L-% IV SOLN
INTRAVENOUS | Status: DC | PRN
  Administered 2024-03-23: 1000 mL

## 2024-03-23 MED ORDER — FENTANYL CITRATE (PF) 100 MCG/2ML IJ SOLN
INTRAMUSCULAR | Status: DC | PRN
  Administered 2024-03-23: 50 ug via INTRAVENOUS

## 2024-03-23 MED ORDER — FENTANYL CITRATE (PF) 100 MCG/2ML IJ SOLN
INTRAMUSCULAR | Status: AC
  Filled 2024-03-23: qty 2

## 2024-03-23 MED ORDER — LIDOCAINE HCL (PF) 1 % IJ SOLN
INTRAMUSCULAR | Status: DC | PRN
  Administered 2024-03-23: 5 mL

## 2024-03-23 MED ORDER — IODIXANOL 320 MG/ML IV SOLN
INTRAVENOUS | Status: DC | PRN
  Administered 2024-03-23: 130 mL

## 2024-03-23 MED ORDER — MIDAZOLAM HCL 2 MG/2ML IJ SOLN
INTRAMUSCULAR | Status: AC
  Filled 2024-03-23: qty 2

## 2024-03-23 MED ORDER — HEPARIN SODIUM (PORCINE) 1000 UNIT/ML IJ SOLN
INTRAMUSCULAR | Status: DC | PRN
  Administered 2024-03-23: 5000 [IU] via INTRAVENOUS

## 2024-03-23 MED ORDER — DEXTROSE 50 % IV SOLN
INTRAVENOUS | Status: DC | PRN
  Administered 2024-03-23: 25 mL via INTRAVENOUS

## 2024-03-23 MED ORDER — HYDRALAZINE HCL 20 MG/ML IJ SOLN
5.0000 mg | INTRAMUSCULAR | Status: DC | PRN
  Administered 2024-03-25: 5 mg via INTRAVENOUS
  Filled 2024-03-23: qty 1

## 2024-03-23 MED ORDER — DEXTROSE 50 % IV SOLN: 12.5000 g | INTRAVENOUS | Status: AC

## 2024-03-23 MED ORDER — HYDRALAZINE HCL 20 MG/ML IJ SOLN
INTRAMUSCULAR | Status: AC
  Filled 2024-03-23: qty 1

## 2024-03-23 MED ORDER — LIDOCAINE HCL (PF) 1 % IJ SOLN
INTRAMUSCULAR | Status: AC
  Filled 2024-03-23: qty 30

## 2024-03-23 MED ORDER — CLOPIDOGREL BISULFATE 300 MG PO TABS
ORAL_TABLET | ORAL | Status: AC
  Filled 2024-03-23: qty 1

## 2024-03-23 MED ORDER — HEPARIN SODIUM (PORCINE) 1000 UNIT/ML IJ SOLN
INTRAMUSCULAR | Status: AC
  Filled 2024-03-23: qty 10

## 2024-03-23 MED ORDER — GLUCOSE 4 G PO CHEW
1.0000 | CHEWABLE_TABLET | Freq: Once | ORAL | Status: AC | PRN
  Administered 2024-03-23: 4 g via ORAL

## 2024-03-23 MED ORDER — SODIUM CHLORIDE 0.9 % WEIGHT BASED INFUSION
1.0000 mL/kg/h | INTRAVENOUS | Status: AC
  Administered 2024-03-23: 1 mL/kg/h via INTRAVENOUS

## 2024-03-23 MED ORDER — SODIUM CHLORIDE 0.9% FLUSH: 3.0000 mL | INTRAVENOUS | Status: DC | PRN

## 2024-03-23 MED ORDER — MIDAZOLAM HCL 2 MG/2ML IJ SOLN
INTRAMUSCULAR | Status: DC | PRN
  Administered 2024-03-23: 2 mg via INTRAVENOUS

## 2024-03-23 MED ORDER — SODIUM CHLORIDE 0.9% FLUSH
3.0000 mL | Freq: Two times a day (BID) | INTRAVENOUS | Status: DC
  Administered 2024-03-24 – 2024-03-26 (×5): 3 mL via INTRAVENOUS

## 2024-03-23 MED ORDER — ALPRAZOLAM 0.5 MG PO TABS
0.5000 mg | ORAL_TABLET | Freq: Two times a day (BID) | ORAL | Status: DC
  Administered 2024-03-23 – 2024-03-27 (×7): 0.5 mg via ORAL
  Filled 2024-03-23 (×7): qty 1

## 2024-03-23 MED ORDER — ACETAMINOPHEN 325 MG PO TABS: 650.0000 mg | ORAL_TABLET | ORAL | Status: DC | PRN

## 2024-03-23 MED ORDER — CLOPIDOGREL BISULFATE 300 MG PO TABS
ORAL_TABLET | ORAL | Status: DC | PRN
  Administered 2024-03-23: 300 mg via ORAL

## 2024-03-23 MED ORDER — CLOPIDOGREL BISULFATE 75 MG PO TABS
75.0000 mg | ORAL_TABLET | Freq: Every day | ORAL | Status: DC
  Administered 2024-03-24 – 2024-03-27 (×4): 75 mg via ORAL
  Filled 2024-03-23 (×4): qty 1

## 2024-03-23 SURGICAL SUPPLY — 20 items
BALLOON MUSTANG 4X60X135 (BALLOONS) IMPLANT
CATH OMNI FLUSH 5F 65CM (CATHETERS) IMPLANT
CATH QUICKCROSS SUPP .035X90CM (MICROCATHETER) IMPLANT
CATH SHOCKWAVE M5 4.5X60 (CATHETERS) IMPLANT
COVER DOME SNAP 22 D (MISCELLANEOUS) IMPLANT
DEVICE CLOSURE MYNXGRIP 6/7F (Vascular Products) IMPLANT
GLIDEWIRE ADV .035X260CM (WIRE) IMPLANT
KIT ENCORE 26 ADVANTAGE (KITS) IMPLANT
KIT MICROPUNCTURE NIT STIFF (SHEATH) IMPLANT
KIT SINGLE USE MANIFOLD (KITS) IMPLANT
SET ATX-X65L (MISCELLANEOUS) IMPLANT
SHEATH CATAPULT 6F 45 MP (SHEATH) IMPLANT
SHEATH PINNACLE 5F 10CM (SHEATH) IMPLANT
SHEATH PINNACLE 6F 10CM (SHEATH) IMPLANT
SHEATH PROBE COVER 6X72 (BAG) IMPLANT
STENT ELUVIA 6X80X130 (Permanent Stent) IMPLANT
STENT VIABAHN 7X19 6FR 80 (Permanent Stent) IMPLANT
TRAY PV CATH (CUSTOM PROCEDURE TRAY) ×1 IMPLANT
WIRE BENTSON .035X145CM (WIRE) IMPLANT
WIRE SPARTACORE .014X300CM (WIRE) IMPLANT

## 2024-03-23 NOTE — Plan of Care (Signed)
  Problem: Fluid Volume: Goal: Ability to maintain a balanced intake and output will improve Outcome: Progressing   Problem: Clinical Measurements: Goal: Will remain free from infection Outcome: Progressing

## 2024-03-23 NOTE — Progress Notes (Signed)
 Patient was able to void once after the procedure but her bladder was distended and tender, bladder scan showed , In and Out cath done, and then patient starting having hyperventilation, complaint of chest tightness without any radiating pain, 12 lead EKG showed NSR, put her on [place] 2 liters, Informed to the MD, Xanax  0.5mg  given as per the order, encouraged for deep breathing, patient was gradually feeling better, will continue to monitor; Trop I, chest Xray pending.     03/23/24 1630  Vitals  BP (!) 142/66  MAP (mmHg) 88  BP Location Right Arm  BP Method Automatic  Patient Position (if appropriate) Lying  Pulse Rate 62  Pulse Rate Source Monitor  ECG Heart Rate 61  Resp 19  Level of Consciousness  Level of Consciousness Alert  MEWS COLOR  MEWS Score Color Green  Oxygen Therapy  SpO2 100 %  O2 Device Room Air  MEWS Score  MEWS Temp 0  MEWS Systolic 0  MEWS Pulse 0  MEWS RR 0  MEWS LOC 0  MEWS Score 0

## 2024-03-23 NOTE — Progress Notes (Signed)
 PROGRESS NOTE    Breniya Goertzen  FMW:[id number] DOB: April 13, 1961 DOA: 03/18/2024 PCP: Darilyn Rosalva Bruckner, PA-C   Brief Narrative:  This 63 y.o. Female with PVD s/p CEA, hx stroke, T1DM, HTN and asthma who presented with a toe infection, found to have osteomyelitis of the 3rd toe. Vascular Surgery consulted.  Patient is scheduled to have angiography today.  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 ABI was abnormal, so vascular surgery consulted. Continue Rocephin  and vancomycin  Plan is for angiography with vascular surgery today. Depending on outcome, orthopedics may take to the OR on Friday. Hold Plavix  for possible surgery.   PAD (peripheral artery disease) (HCC) Chronic limb threatening ischemia: Hyperlipidemia Patient is scheduled for angiography today. Continue aspirin , Lipitor  Hold Plavix  pending surgery   AKI (acute kidney injury) (HCC) Baseline Cr 1.1, here up to 1.5 in setting of infection/SIRS.   No improvement with IV fluids so far. Hold losartan  Continue IV fluids. Continue to monitor serum creatinine.   Essential hypertension: Blood pressure quite elevated Continue amlodipine  Replace losartan  with hydralazine .   Uncontrolled diabetes mellitus with hyperglycemia, with long-term current use of insulin  (HCC) Peripheral neuropathy: Glucose remains elevated Continue glargine at increased dose. Continue sliding scale corrections Add mealtime insulin . Resume home gabapentin    Normocytic  anemia: No bleeding observed or reported. Trend hemoglobin postop.   Dyslipidemia: Continue Lipitor    Hypothyroidism: Continue levothyroxine .   COPD (chronic obstructive pulmonary disease) (HCC) No evidence of flare Continue ICS/LABA.   Mood disorder: Continue bupropion , sertraline , trazodone .   Pancreatic insufficiency: Continue Creon .   Smoking: Smoking cessation recommended Continue nicotine  patch   Thrush: Continue nystatin  swish and swallow   Hyponatremia: Mild, asymptomatic - caution with fluids after surgery - Trend BMP   DVT prophylaxis: Heparin  subcu Code Status: Full code Family Communication: No family at bedside. Disposition Plan:   Status is: Inpatient Remains inpatient appropriate because: Severity of illness.     Consultants:  Orthopedics  Procedures: Scheduled angiography today Antimicrobials:  Anti-infectives (From admission, onward)    Start     Dose/Rate Route Frequency Ordered Stop   03/22/24 1045  methenamine  (HIPREX ) tablet 1 g  Status:  Discontinued        1 g Oral 2 times daily 03/22/24 0947 03/22/24 0947   03/20/24 1000  vancomycin  (VANCOREADY) IVPB 750 mg/150 mL  Status:  Discontinued        750 mg 150 mL/hr over 60 Minutes Intravenous Every 24 hours 03/19/24 0630 03/19/24 1511   03/20/24 1000  [MAR Hold]  vancomycin  (VANCOCIN ) IVPB 1000 mg/200 mL premix        (MAR Hold since Wed 03/23/2024 at 1124.Hold Reason: Transfer to a Procedural area)   1,000 mg 200 mL/hr over 60 Minutes Intravenous Every 24 hours 03/19/24 1511     03/19/24 2200  [MAR Hold]  cefTRIAXone  (ROCEPHIN ) 2 g in sodium chloride  0.9 % 100 mL IVPB        (MAR Hold since Wed 03/23/2024 at 1124.Hold Reason: Transfer to a Procedural area)  2 g 200 mL/hr over 30 Minutes Intravenous Every 24 hours 03/19/24 0413     03/19/24 0630  vancomycin  (VANCOREADY) IVPB 1250 mg/250 mL        1,250 mg 166.7 mL/hr over 90 Minutes Intravenous  Once 03/19/24 0615 03/19/24 0826    03/19/24 0345  cefTRIAXone  (ROCEPHIN ) 2 g in sodium chloride  0.9 % 100 mL IVPB        2 g 200 mL/hr over 30 Minutes Intravenous  Once 03/19/24 0340 03/19/24 9379       Subjective: Patient seen and examined at bedside.  Overnight events noted. Patient reports feeling better,  She is scheduled to have angiography today.  Objective: Vitals:   03/23/24 0809 03/23/24 1345 03/23/24 1400 03/23/24 1415  BP:  (!) 154/60 (!) 119/51 (!) 119/54  Pulse: 78 (!) 58 (!) 56 (!) 55  Resp: 16 15 14 16   Temp:      TempSrc:      SpO2:  96% 96% 96%  Weight:      Height:       No intake or output data in the 24 hours ending 03/23/24 1440 Filed Weights   03/19/24 0645  Weight: 70 kg    Examination:  General exam: Appears calm and comfortable, not in any acute distress. Respiratory system: CTA Bilaterally. Respiratory effort normal. RR 16 Cardiovascular system: S1 & S2 heard, RRR. No JVD, murmurs, rubs, gallops or clicks.  Gastrointestinal system: Abdomen is nondistended, soft and nontender. Normal bowel sounds heard. Central nervous system: Alert and oriented X 3. No focal neurological deficits. Extremities: Osteomyelitis of the right third toe. Skin: No rashes, lesions or ulcers Psychiatry: Judgement and insight appear normal. Mood & affect appropriate.    Data Reviewed: I have personally reviewed following labs and imaging studies  CBC: Recent Labs  Lab 03/18/24 2151 03/19/24 1211 03/20/24 0439 03/22/24 0442 03/23/24 0450  WBC 9.7 6.8 7.1 7.5 7.8  NEUTROABS 5.9 4.0  --   --   --   HGB 9.3* 10.1* 8.9* 9.8* 9.6*  HCT 28.9* 31.4* 28.0* 29.6* 28.9*  MCV 93.2 94.6 92.4 90.8 90.9  PLT 326 329 311 313 293   Basic Metabolic Panel: Recent Labs  Lab 03/18/24 2151 03/19/24 1211 03/20/24 0439 03/22/24 0442 03/23/24 0450  NA 132* 134* 135 133* 132*  K 4.7 4.8 4.7 4.1 4.0  CL 100 104 104 102 100  CO2 23 22 19* 21* 21*  GLUCOSE 165* 191* 233* 269* 320*  BUN 28* 22 21 28* 28*   CREATININE 1.52* 1.31* 1.36* 1.43* 1.40*  CALCIUM  9.0 8.8* 8.4* 8.9 8.6*   GFR: Estimated Creatinine Clearance: 41 mL/min (A) (by C-G formula based on SCr of 1.4 mg/dL (H)). Liver Function Tests: Recent Labs  Lab 03/18/24 2151 03/22/24 0442  AST 25 18  ALT <5 8  ALKPHOS 97 89  BILITOT 0.5 0.5  PROT 6.8 6.4*  ALBUMIN  3.2* 3.0*   No results for input(s): LIPASE, AMYLASE in the last 168 hours. No results for input(s): AMMONIA in the last 168 hours. Coagulation Profile: No results for input(s): INR, PROTIME in the last 168 hours. Cardiac Enzymes: No results for input(s): CKTOTAL, CKMB, CKMBINDEX, TROPONINI in the last 168 hours. BNP (last 3 results) No results for input(s): PROBNP in the last 8760 hours. HbA1C: No results for input(s): HGBA1C in the last 72 hours. CBG: Recent Labs  Lab 03/23/24 0910 03/23/24 1116 03/23/24 1134 03/23/24 1206 03/23/24 1345  GLUCAP 111* 79 65* 107* 140*  Lipid Profile: No results for input(s): CHOL, HDL, LDLCALC, TRIG, CHOLHDL, LDLDIRECT in the last 72 hours. Thyroid  Function Tests: No results for input(s): TSH, T4TOTAL, FREET4, T3FREE, THYROIDAB in the last 72 hours. Anemia Panel: No results for input(s): VITAMINB12, FOLATE, FERRITIN, TIBC, IRON, RETICCTPCT in the last 72 hours. Sepsis Labs: No results for input(s): PROCALCITON, LATICACIDVEN in the last 168 hours.  Recent Results (from the past 240 hours)  Culture, blood (Routine X 2) w Reflex to ID Panel     Status: None (Preliminary result)   Collection Time: 03/19/24  4:02 PM   Specimen: BLOOD LEFT HAND  Result Value Ref Range Status   Specimen Description BLOOD LEFT HAND  Final   Special Requests   Final    BOTTLES DRAWN AEROBIC AND ANAEROBIC Blood Culture adequate volume   Culture   Final    NO GROWTH 4 DAYS Performed at [facility], [address] [place], [place] [address]    Report Status PENDING   Incomplete  Culture, blood (Routine X 2) w Reflex to ID Panel     Status: None (Preliminary result)   Collection Time: 03/19/24  4:13 PM   Specimen: BLOOD RIGHT ARM  Result Value Ref Range Status   Specimen Description BLOOD RIGHT ARM  Final   Special Requests   Final    BOTTLES DRAWN AEROBIC AND ANAEROBIC Blood Culture adequate volume   Culture   Final    NO GROWTH 4 DAYS Performed at [facility], [address], [place], [place] [address]    Report Status PENDING  Incomplete     Radiology Studies: PERIPHERAL VASCULAR CATHETERIZATION Result Date: 03/23/2024 Patient name: Yarimar Lavis MRN: [id number] DOB: 02/24/1961 Sex: female 03/23/2024 Pre-operative Diagnosis: Right lower extremity critical and ischemia with tissue loss at the third toe with gangrene Post-operative diagnosis:  Same Surgeon:  Fonda FORBES Rim, MD Procedure Performed: 1.  Ultrasound-guided micropuncture access of the left common femoral artery in retrograde fashion 2.  Aortogram 3.  Second-order cannulation, right lower extremity angiogram 4.  Balloon lithotripsy 4.5 x 60 mm superficial femoral artery 5.  Drug-eluting stenting superficial femoral artery 6 x 80 mm postdilated with 4 mm balloon to 4.5 mm 6.  Covered stenting right common iliac artery 7 x 19 VBX postdilated to 7.5 mm 7.  Moderate sedation time 52 minutes, contrast volume 130 mL 8.  Device assisted closure-Mynx Indications: Patient is a 63 year old female who presented to the hospital with critical limb ischemia of the right leg with gangrene of the third toe.  She had depressed ABIs, with depressed toe pressure.  Nonpalpable pulses on exam.  After discussing the risks and benefits of right lower extremity angiogram in an effort to define improve distal perfusion and effort to heal upcoming amputation, Jocelin elected to proceed. Findings: Aortogram: Widely patent renal arteries bilaterally.  No flow-limiting stenosis in the infrarenal aorta.  The right common iliac  artery demonstrated greater than 99% stenosis.  The left common iliac artery demonstrated 40% stenosis.  Bilateral external iliac arteries and hypogastric arteries were normal and widely patent. On the right: Widely patent common femoral artery, profunda.  The superficial femoral artery was patent with mild disease.  There was focal, greater than 95% stenosis at a calcific lesion in the mid superficial femoral artery.  Popliteal artery widely patent.  Two-vessel runoff to the foot anterior tibial and peroneal artery.  The posterior tibial artery was not appreciated with collaterals filling the heel. On the  left: Patent common femoral artery with posterior calcific plaque.  Patent profunda, patient superficial femoral artery  Procedure:  The patient was identified in the holding area and taken to room 8.  The patient was then placed supine on the table and prepped and draped in the usual sterile fashion.  A time out was called.  Ultrasound was used to evaluate the left common femoral artery.  It was patent .  A digital ultrasound image was acquired.  A micropuncture needle was used to access the left common femoral artery under ultrasound guidance.  An 018 wire was advanced without resistance and a micropuncture sheath was placed.  The 018 wire was removed and a benson wire was placed.  The micropuncture sheath was exchanged for a 5 french sheath.  An omniflush catheter was advanced over the wire to the level of L-1.  An abdominal angiogram was obtained.  Next, using the omniflush catheter and a benson wire, the aortic bifurcation was crossed and the catheter was placed into theright external iliac artery and right runoff was obtained.  left runoff was performed via retrograde sheath injections. I elected to attempt intervention on the superficial femoral artery as well as the right common iliac artery.  The patient was administered heparin  and a 6 x 45 cm sheath was driven across the aortic bifurcation and into the  proximal portion of the superficial femoral artery.  The calcific lesion was focal, and appeared eccentric, therefore I elected to attempt treatment with balloon lithotripsy.  Unfortunately, follow-up angiography after multiple pulses demonstrated intimal disruption which appeared flow-limiting.  I elected to stent this lesion using a 6 x 60 mm drug-eluting Eluvia stent followed by a 4.5 mm balloon angioplasty.  Next, my attention turned to the right common iliac artery.  The calcific lesion was not directly at the ostia, therefore I felt comfortable stenting from an antegrade approach.  A 7 mm x 19 mm VBX stent was chosen, laid across the lesion and inflated.  The balloon was inflated to 7.5 mm.  Follow-up angiography demonstrated an excellent result.  On follow-up angiography stenosis was also appreciated in the left common iliac artery.  I am unsure as to whether this was to seeing it in a different view versus iatrogenic from the sheath.  Regardless, the patient had a palpable pulse in the groin and the stenosis appeared less than 40%.  There was no gradient, therefore I elected not to stent. The patient was closed with a minx with manual pressure held after. Impression: Successful iliac artery covered stenting 7 x 19 VBX postdilated to 7.5 mm, successful drug-eluting stenting right superficial femoral artery 6 x 80 mm Eluvia Patient has been maximally revascularized. Fonda FORBES Rim MD [facility] Office: [phone]   Scheduled Meds:  [facility] Hold] acetaminophen   1,000 mg Oral TID   [MAR Hold] amLODipine   7.5 mg Oral QPM   [MAR Hold] aspirin  EC  81 mg Oral Daily   [MAR Hold] atorvastatin   80 mg Oral q AM   [MAR Hold] buPROPion   150 mg Oral q morning   dextrose   12.5 g Intravenous STAT   [MAR Hold] fluticasone  furoate-vilanterol  1 puff Inhalation Daily   [MAR Hold] gabapentin   300 mg Oral TID   glucose       [MAR Hold] hydrALAZINE   25 mg Oral Q8H   [MAR Hold] insulin   aspart  0-15 Units Subcutaneous TID WC   [MAR Hold] insulin  aspart  3 Units Subcutaneous  TID WC   [MAR Hold] insulin  glargine  15 Units Subcutaneous QHS   [MAR Hold] latanoprost   1 drop Both Eyes QHS   [MAR Hold] levothyroxine   50 mcg Oral QAC breakfast   [MAR Hold] lipase/protease/amylase  48,000 Units Oral TID with meals   [MAR Hold] nicotine   14 mg Transdermal Daily   [MAR Hold] nystatin   5 mL Oral QID   [MAR Hold] pantoprazole   40 mg Oral BID   [MAR Hold] saccharomyces boulardii  250 mg Oral BID   [MAR Hold] sertraline   100 mg Oral q morning   Continuous Infusions:  [MAR Hold] cefTRIAXone  (ROCEPHIN )  IV 2 g (03/22/24 2136)   [MAR Hold] promethazine  (PHENERGAN ) injection (IM or IVPB)     [MAR Hold] vancomycin  Stopped (03/22/24 1100)     LOS: 4 days    Time spent: 50 mins    Darcel Dawley, MD [facility]   If 7PM-7AM, please contact night-coverage

## 2024-03-23 NOTE — Plan of Care (Signed)

## 2024-03-23 NOTE — Progress Notes (Signed)
 Patient received from Cathlab, she is quiet drowsy but easily awake with voice, V/S obtained, CCMD obtained, CCMD notified, CHG bath given, left groin vascular site is C/D/I, educated about bedrest, all needs met, call bell in reach.     03/23/24 1500  Vitals  Temp 98.7 F (37.1 C)  Temp Source Oral  BP (!) 172/59  MAP (mmHg) 89  BP Location Right Arm  BP Method Automatic  Patient Position (if appropriate) Lying  Pulse Rate (!) 55  Pulse Rate Source Monitor  ECG Heart Rate (!) 55  Resp 13  Level of Consciousness  Level of Consciousness Responds to Voice  MEWS COLOR  MEWS Score Color Yellow  Oxygen Therapy  SpO2 96 %  O2 Device Room Air  Pain Assessment  Pain Scale Faces  Pain Score 0  MEWS Score  MEWS Temp 0  MEWS Systolic 0  MEWS Pulse 0  MEWS RR 1  MEWS LOC 1  MEWS Score 2

## 2024-03-23 NOTE — Progress Notes (Signed)
  Daily Progress Note  S/p: Right lower extremity critical ischemia with tissue loss and gangrene of the third digit  Subjective: No complaints this morning  Objective: Vitals:   03/23/24 0806 03/23/24 0809  BP: (!) 145/55   Pulse: 77 78  Resp: 16 16  Temp: (!) 97.5 F (36.4 C)   SpO2: 100%     Physical Examination Nonlabored breathing Regular rate Alert and oriented x 3 Palpable femoral arteries Nonpalpable pedal pulses on the right  ASSESSMENT/PLAN:  Patient is a 63 year old female well-known to my service having previously undergone carotid endarterectomy for symptomatic ICA stenosis.  She presented to the hospital in need of toe amputation, with an ABI of 0.51 and toe pressure less than 60.  After discussing the risks and benefits of right lower extremity angiogram in an effort to define improve distal perfusion to promote wound healing, Jaculin elected to proceed.   Fonda FORBES Rim MD MS [facility] [phone] 03/23/2024  11:16 AM

## 2024-03-23 NOTE — Progress Notes (Signed)
 Pharmacy Antibiotic Note  Michelle Nicholson is a 63 y.o. female admitted on 03/18/2024 with wound infection.  Pharmacy has been consulted for Vancomycin  dosing. WBC WNL. Vancomycin  1250mg  x1 was given yesterday with the plan of 750mg  IV every 24 hours. With improved renal function, will increase dose to 1000mg  IV every 24 hours. (AUC 487.2) (Scr 1.31, Vd 0.72) Plan: -Icontinue Vancomycin  to 1000mg  IV every 24 hours (cAUC 515) -Continue Ceftriaxone  per MD -Trend WBC, temp, renal function  -F/U infectious work-up -Collect vancomycin  level at steady state and as indicated  Temp (24hrs), Avg:97.8 F (36.6 C), Min:97.5 F (36.4 C), Max:98 F (36.7 C)  Recent Labs  Lab 03/18/24 2151 03/19/24 1211 03/20/24 0439 03/22/24 0442 03/22/24 1414 03/23/24 0450 03/23/24 0834  WBC 9.7 6.8 7.1 7.5  --  7.8  --   CREATININE 1.52* 1.31* 1.36* 1.43*  --  1.40*  --   VANCOTROUGH  --   --   --   --   --   --  15  VANCOPEAK  --   --   --   --  27*  --   --     Estimated Creatinine Clearance: 41 mL/min (A) (by C-G formula based on SCr of 1.4 mg/dL (H)).    Allergies  Allergen Reactions   Dorzolamide Hcl-Timolol Mal Itching and Other (See Comments)    Makes her eyes burn. Conjunctival Injection with Burning. Visual Acuity Blurry   Brimonidine Rash and Other (See Comments)    Follicular conjunctivitis   Netarsudil-Latanoprost  Other (See Comments)    Redness, tearing, burning    Thank you for allowing pharmacy to be involved with this patient's care.  Benedetta Heath BS, PharmD, BCPS Clinical Pharmacist 03/23/2024 12:48 PM  Contact: [phone] after 3 PM

## 2024-03-23 NOTE — Op Note (Signed)
 Patient name: Michelle Nicholson MRN: [id number] DOB: October 24, 1960 Sex: female  03/23/2024 Pre-operative Diagnosis: Right lower extremity critical and ischemia with tissue loss at the third toe with gangrene Post-operative diagnosis:  Same Surgeon:  Fonda FORBES Rim, MD Procedure Performed: 1.  Ultrasound-guided micropuncture access of the left common femoral artery in retrograde fashion 2.  Aortogram 3.  Second-order cannulation, right lower extremity angiogram 4.  Balloon lithotripsy 4.5 x 60 mm superficial femoral artery 5.  Drug-eluting stenting superficial femoral artery 6 x 80 mm postdilated with 4 mm balloon to 4.5 mm 6.  Covered stenting right common iliac artery 7 x 19 VBX postdilated to 7.5 mm 7.  Moderate sedation time 52 minutes, contrast volume 130 mL 8.  Device assisted closure-Mynx   Indications: Patient is a 63 year old female who presented to the hospital with critical limb ischemia of the right leg with gangrene of the third toe.  She had depressed ABIs, with depressed toe pressure.  Nonpalpable pulses on exam.  After discussing the risks and benefits of right lower extremity angiogram in an effort to define improve distal perfusion and effort to heal upcoming amputation, Maziah elected to proceed.  Findings:   Aortogram: Widely patent renal arteries bilaterally.  No flow-limiting stenosis in the infrarenal aorta.  The right common iliac artery demonstrated greater than 99% stenosis.  The left common iliac artery demonstrated 40% stenosis.  Bilateral external iliac arteries and hypogastric arteries were normal and widely patent.  On the right: Widely patent common femoral artery, profunda.  The superficial femoral artery was patent with mild disease.  There was focal, greater than 95% stenosis at a calcific lesion in the mid superficial femoral artery.  Popliteal artery widely patent.  Two-vessel runoff to the foot anterior tibial and peroneal artery.  The posterior tibial artery was  not appreciated with collaterals filling the heel.  On the left: Patent common femoral artery with posterior calcific plaque.  Patent profunda, patient superficial femoral artery   Procedure:  The patient was identified in the holding area and taken to room 8.  The patient was then placed supine on the table and prepped and draped in the usual sterile fashion.  A time out was called.  Ultrasound was used to evaluate the left common femoral artery.  It was patent .  A digital ultrasound image was acquired.  A micropuncture needle was used to access the left common femoral artery under ultrasound guidance.  An 018 wire was advanced without resistance and a micropuncture sheath was placed.  The 018 wire was removed and a benson wire was placed.  The micropuncture sheath was exchanged for a 5 french sheath.  An omniflush catheter was advanced over the wire to the level of L-1.  An abdominal angiogram was obtained.  Next, using the omniflush catheter and a benson wire, the aortic bifurcation was crossed and the catheter was placed into theright external iliac artery and right runoff was obtained.  left runoff was performed via retrograde sheath injections.  I elected to attempt intervention on the superficial femoral artery as well as the right common iliac artery.  The patient was administered heparin  and a 6 x 45 cm sheath was driven across the aortic bifurcation and into the proximal portion of the superficial femoral artery.  The calcific lesion was focal, and appeared eccentric, therefore I elected to attempt treatment with balloon lithotripsy.  Unfortunately, follow-up angiography after multiple pulses demonstrated intimal disruption which appeared flow-limiting.  I elected to stent  this lesion using a 6 x 60 mm drug-eluting Eluvia stent followed by a 4.5 mm balloon angioplasty.  Next, my attention turned to the right common iliac artery.  The calcific lesion was not directly at the ostia, therefore I felt  comfortable stenting from an antegrade approach.  A 7 mm x 19 mm VBX stent was chosen, laid across the lesion and inflated.  The balloon was inflated to 7.5 mm.  Follow-up angiography demonstrated an excellent result.  On follow-up angiography stenosis was also appreciated in the left common iliac artery.  I am unsure as to whether this was to seeing it in a different view versus iatrogenic from the sheath.  Regardless, the patient had a palpable pulse in the groin and the stenosis appeared less than 40%.  There was no gradient, therefore I elected not to stent.  The patient was closed with a minx with manual pressure held after.   Impression: Successful iliac artery covered stenting 7 x 19 VBX postdilated to 7.5 mm, successful drug-eluting stenting right superficial femoral artery 6 x 80 mm Eluvia  Patient has been maximally revascularized.   Fonda FORBES Rim MD [facility] [place] Office: [phone]

## 2024-03-24 ENCOUNTER — Encounter (HOSPITAL_COMMUNITY): Payer: Self-pay | Admitting: Vascular Surgery

## 2024-03-24 DIAGNOSIS — I70261 Atherosclerosis of native arteries of extremities with gangrene, right leg: Secondary | ICD-10-CM

## 2024-03-24 DIAGNOSIS — M86171 Other acute osteomyelitis, right ankle and foot: Secondary | ICD-10-CM

## 2024-03-24 DIAGNOSIS — I10 Essential (primary) hypertension: Secondary | ICD-10-CM

## 2024-03-24 DIAGNOSIS — E119 Type 2 diabetes mellitus without complications: Secondary | ICD-10-CM

## 2024-03-24 DIAGNOSIS — Z87891 Personal history of nicotine dependence: Secondary | ICD-10-CM

## 2024-03-24 DIAGNOSIS — I6523 Occlusion and stenosis of bilateral carotid arteries: Secondary | ICD-10-CM

## 2024-03-24 DIAGNOSIS — M869 Osteomyelitis, unspecified: Secondary | ICD-10-CM | POA: Diagnosis not present

## 2024-03-24 LAB — BASIC METABOLIC PANEL WITH GFR
Anion gap: 10 (ref 5–15)
BUN: 29 mg/dL — ABNORMAL HIGH (ref 8–23)
CO2: 18 mmol/L — ABNORMAL LOW (ref 22–32)
Calcium: 8.3 mg/dL — ABNORMAL LOW (ref 8.9–10.3)
Chloride: 102 mmol/L (ref 98–111)
Creatinine, Ser: 1.62 mg/dL — ABNORMAL HIGH (ref 0.44–1.00)
GFR, Estimated: 35 mL/min — ABNORMAL LOW (ref >60)
Glucose, Bld: 306 mg/dL — ABNORMAL HIGH (ref 70–99)
Potassium: 4.1 mmol/L (ref 3.5–5.1)
Sodium: 130 mmol/L — ABNORMAL LOW (ref 135–145)

## 2024-03-24 LAB — CBC
HCT: 28.9 % — ABNORMAL LOW (ref 36.0–46.0)
Hemoglobin: 9.5 g/dL — ABNORMAL LOW (ref 12.0–15.0)
MCH: 30 pg (ref 26.0–34.0)
MCHC: 32.9 g/dL (ref 30.0–36.0)
MCV: 91.2 fL (ref 80.0–100.0)
Platelets: 306 K/uL (ref 150–400)
RBC: 3.17 MIL/uL — ABNORMAL LOW (ref 3.87–5.11)
RDW: 13.6 % (ref 11.5–15.5)
WBC: 8.5 K/uL (ref 4.0–10.5)
nRBC: 0 % (ref 0.0–0.2)

## 2024-03-24 LAB — GLUCOSE, CAPILLARY
Glucose-Capillary: 333 mg/dL — ABNORMAL HIGH (ref 70–99)
Glucose-Capillary: 87 mg/dL (ref 70–99)
Glucose-Capillary: 128 mg/dL — ABNORMAL HIGH (ref 70–99)
Glucose-Capillary: 301 mg/dL — ABNORMAL HIGH (ref 70–99)
Glucose-Capillary: 398 mg/dL — ABNORMAL HIGH (ref 70–99)

## 2024-03-24 LAB — MAGNESIUM: Magnesium: 1.7 mg/dL (ref 1.7–2.4)

## 2024-03-24 LAB — CULTURE, BLOOD (ROUTINE X 2)
Culture: NO GROWTH
Culture: NO GROWTH
Special Requests: ADEQUATE
Special Requests: ADEQUATE

## 2024-03-24 LAB — PHOSPHORUS: Phosphorus: 5 mg/dL — ABNORMAL HIGH (ref 2.5–4.6)

## 2024-03-24 MED ORDER — SODIUM CHLORIDE 0.9 % IV BOLUS
500.0000 mL | Freq: Once | INTRAVENOUS | Status: AC
  Administered 2024-03-24: 500 mL via INTRAVENOUS

## 2024-03-24 MED ORDER — LINEZOLID 600 MG PO TABS
600.0000 mg | ORAL_TABLET | Freq: Two times a day (BID) | ORAL | Status: AC
  Administered 2024-03-24 – 2024-03-26 (×4): 600 mg via ORAL
  Filled 2024-03-24 (×4): qty 1

## 2024-03-24 MED ORDER — POVIDONE-IODINE 10 % EX SWAB: 2.0000 | Freq: Once | CUTANEOUS | Status: DC

## 2024-03-24 MED ORDER — CHLORHEXIDINE GLUCONATE 4 % EX SOLN
60.0000 mL | Freq: Once | CUTANEOUS | Status: AC
  Administered 2024-03-25: 4 via TOPICAL
  Filled 2024-03-24: qty 15

## 2024-03-24 NOTE — Plan of Care (Signed)

## 2024-03-24 NOTE — Progress Notes (Signed)
 Due to rising scr with AKI, ok to change vanc to linezolid  600mg  BID per Dr. Leotis. F/u plan to see if amputation is needed.  Sergio Batch, PharmD, BCIDP, AAHIVP, CPP Infectious Disease Pharmacist 03/24/2024 9:57 AM

## 2024-03-24 NOTE — Progress Notes (Signed)
 PROGRESS NOTE    Michelle Nicholson  FMW:[id number] DOB: 1960-07-21 DOA: 03/18/2024 PCP: Darilyn Rosalva Bruckner, PA-C   Brief Narrative:  This 63 y.o. Female with PVD s/p CEA, hx stroke, T1DM, HTN and asthma who presented with a toe infection, found to have osteomyelitis of the 3rd toe. Vascular Surgery consulted.  Patient  underwent angiography yesterday and has improved circulation.  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  ABI was abnormal, so vascular surgery consulted. Continue Rocephin  and vancomycin . Patient underwent successful angiography, patient has brisk Doppler flow right foot. Vascular surgery recommended follow-up in office in 4 to 6 weeks for RLE arterial duplex, aortoiliac and ABI. Will talk to orthopedics to see if the OR intervention is needed on Friday. Hold Plavix  for possible surgery.   PAD (peripheral artery disease) (HCC) Chronic limb threatening ischemia: Hyperlipidemia: Patient underwent successful angiography. Continue aspirin , Lipitor  Hold Plavix  pending surgery   AKI (acute kidney injury) (HCC) Baseline Cr 1.1, here up to 1.5 in setting of infection/SIRS.   No improvement with IV fluids so far. Hold losartan  Continue IV fluids. Continue to monitor serum creatinine.   Essential hypertension: Blood pressure now well controlled. Continue amlodipine  Replace losartan  with hydralazine .   Uncontrolled diabetes mellitus with hyperglycemia, with long-term current use of insulin   (HCC) Peripheral neuropathy: Glucose remains elevated. Continue glargine at increased dose. Continue sliding scale corrections Add mealtime insulin . Resume home gabapentin    Normocytic anemia: No bleeding observed or reported. Trend hemoglobin postop.   Dyslipidemia: Continue Lipitor    Hypothyroidism: Continue levothyroxine .   COPD (chronic obstructive pulmonary disease) (HCC) No evidence of flare Continue ICS/LABA.   Mood disorder: Continue bupropion , sertraline , trazodone .   Pancreatic insufficiency: Continue Creon .   Smoking: Smoking cessation recommended Continue nicotine  patch   Thrush: Continue nystatin  swish and swallow   Hyponatremia: Mild, asymptomatic - caution with fluids after surgery - Trend BMP   DVT prophylaxis: Heparin  subcu Code Status: Full code Family Communication: No family at bedside. Disposition Plan:   Status is: Inpatient Remains inpatient appropriate because: Severity of illness.    Consultants:  Orthopedics Vascular surgery  Procedures: Patient underwent successful angiography yesterday. Antimicrobials:  Anti-infectives (From admission, onward)    Start     Dose/Rate Route Frequency Ordered Stop   03/24/24 1800  linezolid  (ZYVOX ) tablet 600 mg        600 mg Oral 2 times daily 03/24/24 0951     03/22/24 1045  methenamine  (HIPREX ) tablet 1 g  Status:  Discontinued        1 g Oral 2 times daily 03/22/24 0947 03/22/24 0947   03/20/24 1000  vancomycin  (VANCOREADY) IVPB 750 mg/150 mL  Status:  Discontinued        750 mg 150 mL/hr over 60 Minutes Intravenous Every 24 hours 03/19/24 0630 03/19/24 1511   03/20/24 1000  vancomycin  (VANCOCIN ) IVPB 1000 mg/200 mL premix  Status:  Discontinued        1,000 mg 200 mL/hr over 60 Minutes Intravenous Every 24 hours 03/19/24 1511 03/24/24  9048   03/19/24 2200  cefTRIAXone  (ROCEPHIN ) 2 g in sodium chloride  0.9 % 100 mL IVPB        2 g 200 mL/hr over 30 Minutes Intravenous Every 24 hours  03/19/24 0413     03/19/24 0630  vancomycin  (VANCOREADY) IVPB 1250 mg/250 mL        1,250 mg 166.7 mL/hr over 90 Minutes Intravenous  Once 03/19/24 0615 03/19/24 0826   03/19/24 0345  cefTRIAXone  (ROCEPHIN ) 2 g in sodium chloride  0.9 % 100 mL IVPB        2 g 200 mL/hr over 30 Minutes Intravenous  Once 03/19/24 0340 03/19/24 9379       Subjective: Patient seen and examined at bedside. Overnight events noted. Patient reports feeling better, she appears much calmer after started on Xanax  yesterday.  Objective: Vitals:   03/24/24 0600 03/24/24 0800 03/24/24 1137 03/24/24 1153  BP: (!) 144/53 (!) 151/58  139/65  Pulse: (!) 58 72  80  Resp: 17 17  20   Temp:    97.9 F (36.6 C)  TempSrc:    Oral  SpO2:  100% 97%   Weight:      Height:        Intake/Output Summary (Last 24 hours) at 03/24/2024 1332 Last data filed at 03/24/2024 0200 Gross per 24 hour  Intake 1085.5 ml  Output 1000 ml  Net 85.5 ml   Filed Weights   03/19/24 0645  Weight: 70 kg    Examination:  General exam: Appears calm and comfortable, not in any acute distress. Respiratory system: CTA Bilaterally. Respiratory effort normal. RR 15 Cardiovascular system: S1 & S2 heard, RRR. No JVD, murmurs, rubs, gallops or clicks.  Gastrointestinal system: Abdomen is nondistended, soft and nontender. Normal bowel sounds heard. Central nervous system: Alert and oriented X 3. No focal neurological deficits. Extremities: Osteomyelitis of the right third toe. Skin: No rashes, lesions or ulcers Psychiatry: Judgement and insight appear normal. Mood & affect appropriate.    Data Reviewed: I have personally reviewed following labs and imaging studies  CBC: Recent Labs  Lab 03/18/24 2151 03/19/24 1211 03/20/24 0439 03/22/24 0442 03/23/24 0450 03/24/24 0324  WBC 9.7 6.8 7.1 7.5 7.8 8.5  NEUTROABS 5.9 4.0  --   --   --   --   HGB 9.3* 10.1* 8.9* 9.8* 9.6* 9.5*  HCT 28.9* 31.4* 28.0* 29.6* 28.9* 28.9*  MCV 93.2 94.6 92.4  90.8 90.9 91.2  PLT 326 329 311 313 293 306   Basic Metabolic Panel: Recent Labs  Lab 03/19/24 1211 03/20/24 0439 03/22/24 0442 03/23/24 0450 03/24/24 0324  NA 134* 135 133* 132* 130*  K 4.8 4.7 4.1 4.0 4.1  CL 104 104 102 100 102  CO2 22 19* 21* 21* 18*  GLUCOSE 191* 233* 269* 320* 306*  BUN 22 21 28* 28* 29*  CREATININE 1.31* 1.36* 1.43* 1.40* 1.62*  CALCIUM  8.8* 8.4* 8.9 8.6* 8.3*  MG  --   --   --   --  1.7  PHOS  --   --   --   --  5.0*   GFR: Estimated Creatinine Clearance: 35.4 mL/min (A) (by C-G formula based on SCr of 1.62 mg/dL (H)). Liver Function Tests: Recent Labs  Lab 03/18/24 2151 03/22/24 0442  AST 25 18  ALT <5 8  ALKPHOS 97 89  BILITOT 0.5 0.5  PROT 6.8 6.4*  ALBUMIN  3.2* 3.0*   No results for input(s): LIPASE, AMYLASE in the last 168 hours.  No results for input(s): AMMONIA in the last 168 hours. Coagulation Profile: No results for input(s): INR, PROTIME in the last 168 hours. Cardiac Enzymes: No results for input(s): CKTOTAL, CKMB, CKMBINDEX, TROPONINI in the last 168 hours. BNP (last 3 results) No results for input(s): PROBNP in the last 8760 hours. HbA1C: No results for input(s): HGBA1C in the last 72 hours. CBG: Recent Labs  Lab 03/23/24 1646 03/23/24 2103 03/24/24 0613 03/24/24 0822 03/24/24 1155  GLUCAP 156* 305* 333* 87 128*   Lipid Profile: No results for input(s): CHOL, HDL, LDLCALC, TRIG, CHOLHDL, LDLDIRECT in the last 72 hours. Thyroid  Function Tests: No results for input(s): TSH, T4TOTAL, FREET4, T3FREE, THYROIDAB in the last 72 hours. Anemia Panel: No results for input(s): VITAMINB12, FOLATE, FERRITIN, TIBC, IRON, RETICCTPCT in the last 72 hours. Sepsis Labs: No results for input(s): PROCALCITON, LATICACIDVEN in the last 168 hours.  Recent Results (from the past 240 hours)  Culture, blood (Routine X 2) w Reflex to ID Panel     Status: None   Collection Time:  03/19/24  4:02 PM   Specimen: BLOOD LEFT HAND  Result Value Ref Range Status   Specimen Description BLOOD LEFT HAND  Final   Special Requests   Final    BOTTLES DRAWN AEROBIC AND ANAEROBIC Blood Culture adequate volume   Culture   Final    NO GROWTH 5 DAYS Performed at [facility], [address] [place], [place] [address]    Report Status 03/24/2024 FINAL  Final  Culture, blood (Routine X 2) w Reflex to ID Panel     Status: None   Collection Time: 03/19/24  4:13 PM   Specimen: BLOOD RIGHT ARM  Result Value Ref Range Status   Specimen Description BLOOD RIGHT ARM  Final   Special Requests   Final    BOTTLES DRAWN AEROBIC AND ANAEROBIC Blood Culture adequate volume   Culture   Final    NO GROWTH 5 DAYS Performed at [facility], [address] [place], [place] [address]    Report Status 03/24/2024 FINAL  Final     Radiology Studies: DG CHEST PORT 1 VIEW Result Date: 03/23/2024 CLINICAL DATA:  Shortness of breath. EXAM: PORTABLE CHEST 1 VIEW COMPARISON:  Chest radiograph dated 11/27/2022. FINDINGS: No focal consolidation, pleural effusion or pneumothorax. The cardiac silhouette is within normal limits. No acute osseous pathology. IMPRESSION: No active disease. Electronically Signed   By: Vanetta Chou M.D.   On: 03/23/2024 18:04   PERIPHERAL VASCULAR CATHETERIZATION Result Date: 03/23/2024 Patient name: Sherree Shankman MRN: [id number] DOB: September 10, 1960 Sex: female 03/23/2024 Pre-operative Diagnosis: Right lower extremity critical and ischemia with tissue loss at the third toe with gangrene Post-operative diagnosis:  Same Surgeon:  Fonda FORBES Rim, MD Procedure Performed: 1.  Ultrasound-guided micropuncture access of the left common femoral artery in retrograde fashion 2.  Aortogram 3.  Second-order cannulation, right lower extremity angiogram 4.  Balloon lithotripsy 4.5 x 60 mm superficial femoral artery 5.  Drug-eluting stenting superficial femoral artery 6 x 80 mm postdilated  with 4 mm balloon to 4.5 mm 6.  Covered stenting right common iliac artery 7 x 19 VBX postdilated to 7.5 mm 7.  Moderate sedation time 52 minutes, contrast volume 130 mL 8.  Device assisted closure-Mynx Indications: Patient is a 63 year old female who presented to the hospital with critical limb ischemia of the right leg with gangrene of the third toe.  She had depressed ABIs, with depressed toe pressure.  Nonpalpable  pulses on exam.  After discussing the risks and benefits of right lower extremity angiogram in an effort to define improve distal perfusion and effort to heal upcoming amputation, Josephene elected to proceed. Findings: Aortogram: Widely patent renal arteries bilaterally.  No flow-limiting stenosis in the infrarenal aorta.  The right common iliac artery demonstrated greater than 99% stenosis.  The left common iliac artery demonstrated 40% stenosis.  Bilateral external iliac arteries and hypogastric arteries were normal and widely patent. On the right: Widely patent common femoral artery, profunda.  The superficial femoral artery was patent with mild disease.  There was focal, greater than 95% stenosis at a calcific lesion in the mid superficial femoral artery.  Popliteal artery widely patent.  Two-vessel runoff to the foot anterior tibial and peroneal artery.  The posterior tibial artery was not appreciated with collaterals filling the heel. On the left: Patent common femoral artery with posterior calcific plaque.  Patent profunda, patient superficial femoral artery  Procedure:  The patient was identified in the holding area and taken to room 8.  The patient was then placed supine on the table and prepped and draped in the usual sterile fashion.  A time out was called.  Ultrasound was used to evaluate the left common femoral artery.  It was patent .  A digital ultrasound image was acquired.  A micropuncture needle was used to access the left common femoral artery under ultrasound guidance.  An 018 wire was  advanced without resistance and a micropuncture sheath was placed.  The 018 wire was removed and a benson wire was placed.  The micropuncture sheath was exchanged for a 5 french sheath.  An omniflush catheter was advanced over the wire to the level of L-1.  An abdominal angiogram was obtained.  Next, using the omniflush catheter and a benson wire, the aortic bifurcation was crossed and the catheter was placed into theright external iliac artery and right runoff was obtained.  left runoff was performed via retrograde sheath injections. I elected to attempt intervention on the superficial femoral artery as well as the right common iliac artery.  The patient was administered heparin  and a 6 x 45 cm sheath was driven across the aortic bifurcation and into the proximal portion of the superficial femoral artery.  The calcific lesion was focal, and appeared eccentric, therefore I elected to attempt treatment with balloon lithotripsy.  Unfortunately, follow-up angiography after multiple pulses demonstrated intimal disruption which appeared flow-limiting.  I elected to stent this lesion using a 6 x 60 mm drug-eluting Eluvia stent followed by a 4.5 mm balloon angioplasty.  Next, my attention turned to the right common iliac artery.  The calcific lesion was not directly at the ostia, therefore I felt comfortable stenting from an antegrade approach.  A 7 mm x 19 mm VBX stent was chosen, laid across the lesion and inflated.  The balloon was inflated to 7.5 mm.  Follow-up angiography demonstrated an excellent result.  On follow-up angiography stenosis was also appreciated in the left common iliac artery.  I am unsure as to whether this was to seeing it in a different view versus iatrogenic from the sheath.  Regardless, the patient had a palpable pulse in the groin and the stenosis appeared less than 40%.  There was no gradient, therefore I elected not to stent. The patient was closed with a minx with manual pressure held after.  Impression: Successful iliac artery covered stenting 7 x 19 VBX postdilated to 7.5 mm, successful drug-eluting stenting right  superficial femoral artery 6 x 80 mm Eluvia Patient has been maximally revascularized. Fonda FORBES Rim MD [facility] [place] Office: [phone]   Scheduled Meds:  acetaminophen   1,000 mg Oral TID   ALPRAZolam   0.5 mg Oral BID   amLODipine   7.5 mg Oral QPM   aspirin  EC  81 mg Oral Daily   atorvastatin   80 mg Oral q AM   buPROPion   150 mg Oral q morning   clopidogrel   75 mg Oral Q breakfast   fluticasone  furoate-vilanterol  1 puff Inhalation Daily   gabapentin   300 mg Oral TID   hydrALAZINE   25 mg Oral Q8H   insulin  aspart  0-15 Units Subcutaneous TID WC   insulin  aspart  3 Units Subcutaneous TID WC   insulin  glargine  15 Units Subcutaneous QHS   latanoprost   1 drop Both Eyes QHS   levothyroxine   50 mcg Oral QAC breakfast   linezolid   600 mg Oral BID   lipase/protease/amylase  48,000 Units Oral TID with meals   nicotine   14 mg Transdermal Daily   nystatin   5 mL Oral QID   pantoprazole   40 mg Oral BID   saccharomyces boulardii  250 mg Oral BID   sertraline   100 mg Oral q morning   sodium chloride  flush  3 mL Intravenous Q12H   Continuous Infusions:  sodium chloride      cefTRIAXone  (ROCEPHIN )  IV 2 g (03/23/24 2119)   promethazine  (PHENERGAN ) injection (IM or IVPB)       LOS: 5 days    Time spent: 35 mins    Darcel Dawley, MD [facility]   If 7PM-7AM, please contact night-coverage

## 2024-03-24 NOTE — Progress Notes (Addendum)
  Progress Note    03/24/2024 6:44 AM 1 Day Post-Op  Subjective:  sleeping, no complaints  afebrile  Vitals:   03/24/24 0500 03/24/24 0600  BP: (!) 127/48 (!) 144/53  Pulse: (!) 56 (!) 58  Resp: 17 17  Temp:    SpO2:      Physical Exam: General:  sleeping, wakes easily Cardiac:  regular Lungs:  non labored  Incisions:  left groin soft without heatoma Extremities:  brisk doppler flow right pero>AT Abdomen: soft  CBC    Component Value Date/Time   WBC 8.5 03/24/2024 0324   RBC 3.17 (L) 03/24/2024 0324   HGB 9.5 (L) 03/24/2024 0324   HCT 28.9 (L) 03/24/2024 0324   PLT 306 03/24/2024 0324   MCV 91.2 03/24/2024 0324   MCH 30.0 03/24/2024 0324   MCHC 32.9 03/24/2024 0324   RDW 13.6 03/24/2024 0324   LYMPHSABS 1.4 03/19/2024 1211   MONOABS 0.7 03/19/2024 1211   EOSABS 0.3 03/19/2024 1211   BASOSABS 0.1 03/19/2024 1211    BMET    Component Value Date/Time   NA 130 (L) 03/24/2024 0324   K 4.1 03/24/2024 0324   CL 102 03/24/2024 0324   CO2 18 (L) 03/24/2024 0324   GLUCOSE 306 (H) 03/24/2024 0324   BUN 29 (H) 03/24/2024 0324   CREATININE 1.62 (H) 03/24/2024 0324   CALCIUM  8.3 (L) 03/24/2024 0324   GFRNONAA 35 (L) 03/24/2024 0324   GFRAA >60 04/02/2019 2338    INR    Component Value Date/Time   INR 1.0 12/02/2021 0949     Intake/Output Summary (Last 24 hours) at 03/24/2024 0644 Last data filed at 03/24/2024 0200 Gross per 24 hour  Intake 1085.5 ml  Output 1000 ml  Net 85.5 ml      Assessment/Plan:  63 y.o. female is s/p:  Angiogram RLE with balloon lithotripsy, drug eluting stent SFA, covered stent right CIA 03/23/2024 by Dr. Lanis for CLI with 3rd toe tissue loss.   1 Day Post-Op   -pt with brisk doppler flow right foot -creatinine 1.6.  had creatinine of 1.5 on 9/5 -continue asa/statin and plavix  -f/u in our office in 4-6 weeks with RLE arterial duplex, aortoiliac and ABI.  Will also add carotid duplex since she is due for imaging for this.     Lucie Apt, PA-C [facility] [phone] 03/24/2024 6:44 AM  VASCULAR STAFF ADDENDUM: I have independently interviewed and examined the patient. I agree with the above.    Fonda FORBES Lanis MD [facility] Phone Number: [phone] 03/24/2024 7:36 AM

## 2024-03-24 NOTE — Anesthesia Preprocedure Evaluation (Signed)
 Anesthesia Evaluation  Patient identified by MRN, date of birth, ID band Patient awake    Reviewed: Allergy & Precautions, H&P , NPO status , Patient's Chart, lab work & pertinent test results  Airway Mallampati: II  TM Distance: >3 FB Neck ROM: Full    Dental no notable dental hx. (+) Teeth Intact, Dental Advisory Given   Pulmonary COPD,  COPD inhaler, Patient abstained from smoking., former smoker   Pulmonary exam normal breath sounds clear to auscultation       Cardiovascular Exercise Tolerance: Good hypertension, Pt. on medications + Peripheral Vascular Disease   Rhythm:Regular Rate:Normal     Neuro/Psych CVA  negative psych ROS   GI/Hepatic negative GI ROS, Neg liver ROS,,,  Endo/Other  diabetes, Insulin  DependentHypothyroidism    Renal/GU Renal InsufficiencyRenal disease  negative genitourinary   Musculoskeletal  (+) Arthritis , Osteoarthritis,    Abdominal   Peds  Hematology  (+) Blood dyscrasia, anemia   Anesthesia Other Findings   Reproductive/Obstetrics negative OB ROS                              Anesthesia Physical Anesthesia Plan  ASA: 3  Anesthesia Plan: MAC   Post-op Pain Management: Tylenol  PO (pre-op)*   Induction: Intravenous  PONV Risk Score and Plan: 3 and Ondansetron , Dexamethasone  and Propofol  infusion  Airway Management Planned: Natural Airway and Simple Face Mask  Additional Equipment:   Intra-op Plan:   Post-operative Plan:   Informed Consent: I have reviewed the patients History and Physical, chart, labs and discussed the procedure including the risks, benefits and alternatives for the proposed anesthesia with the patient or authorized representative who has indicated his/her understanding and acceptance.     Dental advisory given  Plan Discussed with: CRNA  Anesthesia Plan Comments:          Anesthesia Quick Evaluation

## 2024-03-24 NOTE — Inpatient Diabetes Management (Signed)
 Inpatient Diabetes Program Recommendations  AACE/ADA: New Consensus Statement on Inpatient Glycemic Control (2015)  Target Ranges:  Prepandial:   less than 140 mg/dL      Peak postprandial:   less than 180 mg/dL (1-2 hours)      Critically ill patients:  140 - 180 mg/dL   Lab Results  Component Value Date   GLUCAP 128 (H) 03/24/2024   HGBA1C 8.5 (H) 03/19/2024    Review of Glycemic Control  Latest Reference Range & Units 03/23/24 12:06 03/23/24 13:45 03/23/24 16:46 03/23/24 21:03 03/24/24 06:13 03/24/24 08:22 03/24/24 11:55  Glucose-Capillary 70 - 99 mg/dL 892 (H) 859 (H) 843 (H) 305 (H) 333 (H) 87 128 (H)   Diabetes history: DM 2 Outpatient Diabetes medications:  Tresiba  15 units daily Novolog  4 units tid with meals  Current orders for Inpatient glycemic control:  Lantus  15 units daily Novolog  3 units tid with meals  Novolog  0-15 units tid with meals  Inpatient Diabetes Program Recommendations:    Consider slight increase of Lantus  to 18 units daily.   Thanks,  Randall Bullocks, RN, BC-ADM Inpatient Diabetes Coordinator Pager [phone]  (8a-5p)

## 2024-03-25 ENCOUNTER — Inpatient Hospital Stay (HOSPITAL_COMMUNITY): Payer: Self-pay | Admitting: Anesthesiology

## 2024-03-25 ENCOUNTER — Encounter (HOSPITAL_COMMUNITY)
Admission: EM | Disposition: A | Discharge status: Home / Self Care | Payer: Self-pay | Source: Home / Self Care | Attending: Family Medicine

## 2024-03-25 ENCOUNTER — Other Ambulatory Visit: Payer: Self-pay

## 2024-03-25 DIAGNOSIS — M869 Osteomyelitis, unspecified: Secondary | ICD-10-CM

## 2024-03-25 DIAGNOSIS — E1169 Type 2 diabetes mellitus with other specified complication: Secondary | ICD-10-CM

## 2024-03-25 DIAGNOSIS — I10 Essential (primary) hypertension: Secondary | ICD-10-CM

## 2024-03-25 DIAGNOSIS — E1152 Type 2 diabetes mellitus with diabetic peripheral angiopathy with gangrene: Secondary | ICD-10-CM

## 2024-03-25 HISTORY — PX: AMPUTATION TOE: SHX6595

## 2024-03-25 LAB — GLUCOSE, CAPILLARY
Glucose-Capillary: 155 mg/dL — ABNORMAL HIGH (ref 70–99)
Glucose-Capillary: 162 mg/dL — ABNORMAL HIGH (ref 70–99)
Glucose-Capillary: 163 mg/dL — ABNORMAL HIGH (ref 70–99)
Glucose-Capillary: 173 mg/dL — ABNORMAL HIGH (ref 70–99)
Glucose-Capillary: 160 mg/dL — ABNORMAL HIGH (ref 70–99)
Glucose-Capillary: 240 mg/dL — ABNORMAL HIGH (ref 70–99)
Glucose-Capillary: 233 mg/dL — ABNORMAL HIGH (ref 70–99)
Glucose-Capillary: 102 mg/dL — ABNORMAL HIGH (ref 70–99)

## 2024-03-25 LAB — CBC
HCT: 26.2 % — ABNORMAL LOW (ref 36.0–46.0)
Hemoglobin: 8.7 g/dL — ABNORMAL LOW (ref 12.0–15.0)
MCH: 30.4 pg (ref 26.0–34.0)
MCHC: 33.2 g/dL (ref 30.0–36.0)
MCV: 91.6 fL (ref 80.0–100.0)
Platelets: 275 K/uL (ref 150–400)
RBC: 2.86 MIL/uL — ABNORMAL LOW (ref 3.87–5.11)
RDW: 13.8 % (ref 11.5–15.5)
WBC: 8.2 K/uL (ref 4.0–10.5)
nRBC: 0 % (ref 0.0–0.2)

## 2024-03-25 LAB — BASIC METABOLIC PANEL WITH GFR
Anion gap: 9 (ref 5–15)
BUN: 29 mg/dL — ABNORMAL HIGH (ref 8–23)
CO2: 18 mmol/L — ABNORMAL LOW (ref 22–32)
Calcium: 8.5 mg/dL — ABNORMAL LOW (ref 8.9–10.3)
Chloride: 106 mmol/L (ref 98–111)
Creatinine, Ser: 1.55 mg/dL — ABNORMAL HIGH (ref 0.44–1.00)
GFR, Estimated: 37 mL/min — ABNORMAL LOW (ref >60)
Glucose, Bld: 164 mg/dL — ABNORMAL HIGH (ref 70–99)
Potassium: 4.3 mmol/L (ref 3.5–5.1)
Sodium: 133 mmol/L — ABNORMAL LOW (ref 135–145)

## 2024-03-25 LAB — PHOSPHORUS: Phosphorus: 4.2 mg/dL (ref 2.5–4.6)

## 2024-03-25 LAB — SURGICAL PCR SCREEN
MRSA, PCR: NEGATIVE
Staphylococcus aureus: NEGATIVE

## 2024-03-25 LAB — MAGNESIUM: Magnesium: 1.7 mg/dL (ref 1.7–2.4)

## 2024-03-25 SURGERY — AMPUTATION, TOE
Anesthesia: Monitor Anesthesia Care | Site: Toe | Laterality: Right

## 2024-03-25 MED ORDER — LIDOCAINE HCL (PF) 1 % IJ SOLN
INTRAMUSCULAR | Status: DC | PRN
  Administered 2024-03-25: 18 mL

## 2024-03-25 MED ORDER — HYDROMORPHONE HCL 1 MG/ML IJ SOLN: 0.2500 mg | INTRAMUSCULAR | Status: DC | PRN

## 2024-03-25 MED ORDER — LACTATED RINGERS IV SOLN: INTRAVENOUS | Status: DC

## 2024-03-25 MED ORDER — CHLORHEXIDINE GLUCONATE 0.12 % MT SOLN
15.0000 mL | Freq: Once | OROMUCOSAL | Status: AC
  Administered 2024-03-25: 15 mL via OROMUCOSAL

## 2024-03-25 MED ORDER — MIDAZOLAM HCL 2 MG/2ML IJ SOLN
INTRAMUSCULAR | Status: AC
  Filled 2024-03-25: qty 2

## 2024-03-25 MED ORDER — 0.9 % SODIUM CHLORIDE (POUR BTL) OPTIME
TOPICAL | Status: DC | PRN
  Administered 2024-03-25: 1000 mL

## 2024-03-25 MED ORDER — PROPOFOL 10 MG/ML IV BOLUS
INTRAVENOUS | Status: AC
  Filled 2024-03-25: qty 20

## 2024-03-25 MED ORDER — PROPOFOL 500 MG/50ML IV EMUL
INTRAVENOUS | Status: DC | PRN
  Administered 2024-03-25: 100 ug/kg/min via INTRAVENOUS

## 2024-03-25 MED ORDER — VASHE WOUND IRRIGATION OPTIME
TOPICAL | Status: DC | PRN
  Administered 2024-03-25: 34 [oz_av]

## 2024-03-25 MED ORDER — CHLORHEXIDINE GLUCONATE 0.12 % MT SOLN
OROMUCOSAL | Status: AC
  Filled 2024-03-25: qty 15

## 2024-03-25 MED ORDER — LIDOCAINE HCL (PF) 1 % IJ SOLN
INTRAMUSCULAR | Status: AC
Start: 2024-03-25 — End: 2024-03-25
  Filled 2024-03-25: qty 30

## 2024-03-25 MED ORDER — TRAZODONE HCL 50 MG PO TABS
50.0000 mg | ORAL_TABLET | Freq: Every evening | ORAL | Status: DC | PRN
  Administered 2024-03-25 – 2024-03-26 (×2): 50 mg via ORAL
  Filled 2024-03-25 (×2): qty 1

## 2024-03-25 MED ORDER — INSULIN ASPART 100 UNIT/ML IJ SOLN: 0.0000 [IU] | INTRAMUSCULAR | Status: DC | PRN

## 2024-03-25 MED ORDER — ORAL CARE MOUTH RINSE: 15.0000 mL | Freq: Once | OROMUCOSAL | Status: AC

## 2024-03-25 SURGICAL SUPPLY — 31 items
BAG COUNTER SPONGE SURGICOUNT (BAG) ×2 IMPLANT
BLADE SURG 21 STRL SS (BLADE) ×2 IMPLANT
BNDG COHESIVE 4X5 TAN STRL LF (GAUZE/BANDAGES/DRESSINGS) ×2 IMPLANT
BNDG COHESIVE 6X5 TAN NS LF (GAUZE/BANDAGES/DRESSINGS) IMPLANT
BNDG GAUZE DERMACEA FLUFF 4 (GAUZE/BANDAGES/DRESSINGS) ×2 IMPLANT
CLEANSER WND VASHE 34 (WOUND CARE) IMPLANT
COVER SURGICAL LIGHT HANDLE (MISCELLANEOUS) ×2 IMPLANT
DRAPE U-SHAPE 47X51 STRL (DRAPES) ×2 IMPLANT
DRSG ADAPTIC 3X8 NADH LF (GAUZE/BANDAGES/DRESSINGS) IMPLANT
DURAPREP 26ML APPLICATOR (WOUND CARE) ×2 IMPLANT
ELECT PENCIL ROCKER SW 15FT (MISCELLANEOUS) IMPLANT
ELECTRODE REM PT RTRN 9FT ADLT (ELECTROSURGICAL) ×2 IMPLANT
GAUZE PAD ABD 8X10 STRL (GAUZE/BANDAGES/DRESSINGS) ×2 IMPLANT
GAUZE SPONGE 4X4 12PLY STRL (GAUZE/BANDAGES/DRESSINGS) IMPLANT
GLOVE BIOGEL PI IND STRL 9 (GLOVE) ×2 IMPLANT
GLOVE SURG ORTHO 9.0 STRL STRW (GLOVE) ×2 IMPLANT
GOWN STRL REUS W/ TWL XL LVL3 (GOWN DISPOSABLE) ×4 IMPLANT
GRAFT SKIN WND MICRO 38 (Tissue) IMPLANT
KIT BASIN OR (CUSTOM PROCEDURE TRAY) ×2 IMPLANT
KIT TURNOVER KIT B (KITS) ×2 IMPLANT
MANIFOLD NEPTUNE II (INSTRUMENTS) ×2 IMPLANT
NDL 22X1.5 STRL (OR ONLY) (MISCELLANEOUS) IMPLANT
NEEDLE 22X1.5 STRL (OR ONLY) (MISCELLANEOUS) ×1 IMPLANT
NS IRRIG 1000ML POUR BTL (IV SOLUTION) ×2 IMPLANT
PACK ORTHO EXTREMITY (CUSTOM PROCEDURE TRAY) ×2 IMPLANT
PAD ABD 8X10 STRL (GAUZE/BANDAGES/DRESSINGS) IMPLANT
PAD ARMBOARD POSITIONER FOAM (MISCELLANEOUS) ×4 IMPLANT
SUT ETHILON 2 0 PSLX (SUTURE) ×2 IMPLANT
SYR CONTROL 10ML LL (SYRINGE) IMPLANT
TOWEL GREEN STERILE (TOWEL DISPOSABLE) ×2 IMPLANT
YANKAUER SUCT BULB TIP NO VENT (SUCTIONS) IMPLANT

## 2024-03-25 NOTE — Plan of Care (Signed)
 Problem: Education: Goal: Ability to describe self-care measures that may prevent or decrease complications (Diabetes Survival Skills Education) will improve 03/25/2024 0356 by Leonce Garner BIRCH, RN Outcome: Progressing 03/25/2024 0356 by Leonce Garner BIRCH, RN Outcome: Progressing Goal: Individualized Educational Video(s) 03/25/2024 0356 by Leonce Garner BIRCH, RN Outcome: Progressing 03/25/2024 0356 by Leonce Garner BIRCH, RN Outcome: Progressing   Problem: Coping: Goal: Ability to adjust to condition or change in health will improve 03/25/2024 0356 by Leonce Garner BIRCH, RN Outcome: Progressing 03/25/2024 0356 by Leonce Garner BIRCH, RN Outcome: Progressing   Problem: Fluid Volume: Goal: Ability to maintain a balanced intake and output will improve 03/25/2024 0356 by Leonce Garner BIRCH, RN Outcome: Progressing 03/25/2024 0356 by Leonce Garner BIRCH, RN Outcome: Progressing   Problem: Health Behavior/Discharge Planning: Goal: Ability to identify and utilize available resources and services will improve 03/25/2024 0356 by Leonce Garner BIRCH, RN Outcome: Progressing 03/25/2024 0356 by Leonce Garner BIRCH, RN Outcome: Progressing Goal: Ability to manage health-related needs will improve 03/25/2024 0356 by Leonce Garner BIRCH, RN Outcome: Progressing 03/25/2024 0356 by Leonce Garner D, RN Outcome: Progressing   Problem: Metabolic: Goal: Ability to maintain appropriate glucose levels will improve 03/25/2024 0356 by Leonce Garner BIRCH, RN Outcome: Progressing 03/25/2024 0356 by Leonce Garner D, RN Outcome: Progressing   Problem: Nutritional: Goal: Maintenance of adequate nutrition will improve 03/25/2024 0356 by Leonce Garner BIRCH, RN Outcome: Progressing 03/25/2024 0356 by Leonce Garner D, RN Outcome: Progressing Goal: Progress toward achieving an optimal weight will improve 03/25/2024 0356 by Leonce Garner BIRCH, RN Outcome: Progressing 03/25/2024 0356 by Leonce Garner BIRCH, RN Outcome: Progressing   Problem:  Skin Integrity: Goal: Risk for impaired skin integrity will decrease 03/25/2024 0356 by Leonce Garner BIRCH, RN Outcome: Progressing 03/25/2024 0356 by Leonce Garner BIRCH, RN Outcome: Progressing   Problem: Tissue Perfusion: Goal: Adequacy of tissue perfusion will improve 03/25/2024 0356 by Leonce Garner BIRCH, RN Outcome: Progressing 03/25/2024 0356 by Leonce Garner BIRCH, RN Outcome: Progressing   Problem: Education: Goal: Knowledge of General Education information will improve Description: Including pain rating scale, medication(s)/side effects and non-pharmacologic comfort measures Outcome: Progressing   Problem: Health Behavior/Discharge Planning: Goal: Ability to manage health-related needs will improve Outcome: Progressing   Problem: Clinical Measurements: Goal: Ability to maintain clinical measurements within normal limits will improve Outcome: Progressing Goal: Will remain free from infection Outcome: Progressing Goal: Diagnostic test results will improve Outcome: Progressing Goal: Respiratory complications will improve Outcome: Progressing Goal: Cardiovascular complication will be avoided Outcome: Progressing   Problem: Activity: Goal: Risk for activity intolerance will decrease Outcome: Progressing   Problem: Nutrition: Goal: Adequate nutrition will be maintained Outcome: Progressing   Problem: Coping: Goal: Level of anxiety will decrease Outcome: Progressing   Problem: Elimination: Goal: Will not experience complications related to bowel motility Outcome: Progressing Goal: Will not experience complications related to urinary retention Outcome: Progressing   Problem: Pain Managment: Goal: General experience of comfort will improve and/or be controlled Outcome: Progressing   Problem: Safety: Goal: Ability to remain free from injury will improve Outcome: Progressing   Problem: Skin Integrity: Goal: Risk for impaired skin integrity will decrease Outcome:  Progressing   Problem: Education: Goal: Understanding of CV disease, CV risk reduction, and recovery process will improve Outcome: Progressing Goal: Individualized Educational Video(s) Outcome: Progressing   Problem: Activity: Goal: Ability to return to baseline activity level will improve Outcome: Progressing   Problem: Cardiovascular: Goal: Ability to achieve and maintain adequate cardiovascular perfusion will improve Outcome: Progressing Goal: Vascular  access site(s) Level 0-1 will be maintained Outcome: Progressing   Problem: Health Behavior/Discharge Planning: Goal: Ability to safely manage health-related needs after discharge will improve Outcome: Progressing

## 2024-03-25 NOTE — Interval H&P Note (Signed)
 History and Physical Interval Note:  03/25/2024 6:46 AM  Michelle Nicholson  has presented today for surgery, with the diagnosis of Gangrene Right 3rd Toe, Osteomyelitis.  The various methods of treatment have been discussed with the patient and family. After consideration of risks, benefits and other options for treatment, the patient has consented to  Procedure(s) with comments: AMPUTATION, TOE (Right) - RIGHT 3RD TOE AMPUTATION as a surgical intervention.  The patient's history has been reviewed, patient examined, no change in status, stable for surgery.  I have reviewed the patient's chart and labs.  Questions were answered to the patient's satisfaction.     [name]

## 2024-03-25 NOTE — Inpatient Diabetes Management (Incomplete)
 Inpatient Diabetes Program Recommendations  AACE/ADA: New Consensus Statement on Inpatient Glycemic Control (2015)  Target Ranges:  Prepandial:   less than 140 mg/dL      Peak postprandial:   less than 180 mg/dL (1-2 hours)      Critically ill patients:  140 - 180 mg/dL   Lab Results  Component Value Date   GLUCAP 162 (H) 03/25/2024   HGBA1C 8.5 (H) 03/19/2024    Review of Glycemic Control  Latest Reference Range & Units 03/24/24 08:22 03/24/24 11:55 03/24/24 16:26 03/24/24 21:02 03/25/24 06:10 03/25/24 08:07  Glucose-Capillary 70 - 99 mg/dL 87 871 (H) 698 (H) 601 (H) 155 (H) 162 (H)  (H): Data is abnormally high Diabetes history: Type 2 DM Outpatient Diabetes medications: Tresiba  15 units every day, Novolog  4 units TID Current orders for Inpatient glycemic control: Lantus  15 units at bedtime, Novolog  3 units TID, Novolog  0-15 units TID  Inpatient Diabetes Program Recommendations:    Consider adding Novolog  0-5 units QHS.  Noted patient has been refusing some doses of insulin  and others have been contraindicated per RN charting.

## 2024-03-25 NOTE — Progress Notes (Signed)
 PROGRESS NOTE    Michelle Nicholson  FMW:[id number] DOB: [birth year] DOA: 03/18/2024 PCP: Darilyn Rosalva Bruckner, PA-C   Brief Narrative:  This 63 y.o. Female with PVD s/p CEA, hx stroke, T1DM, HTN and asthma who presented with a toe infection, found to have osteomyelitis of the 3rd toe. Vascular Surgery consulted.  Patient  underwent angiography yesterday and has improved circulation. She is scheduled to have AMPUTATION, third ray right foot today.  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  ABI was abnormal, so vascular surgery consulted. Continue Rocephin  and vancomycin . Patient underwent successful angiography, patient has brisk Doppler flow right foot. Vascular surgery recommended follow-up in office in 4 to 6 weeks for RLE arterial duplex, aortoiliac and ABI. Patient underwent AMPUTATION, third ray right foot , POD # 1. Tolerated well. Hold Plavix  for possible surgery. Resume as per ortho   PAD (peripheral artery disease) (HCC) Chronic limb threatening ischemia: Hyperlipidemia: Patient underwent successful angiography. Continue aspirin , Lipitor . Hold Plavix  pending surgery. She is s/p AMPUTATION, third ray right foot . POD # 1   AKI (acute kidney injury) (HCC) Baseline Cr 1.1, here up to 1.5 in setting of infection/SIRS.   Hold losartan  Continue IV fluids. Creatinine improving Continue to monitor serum creatinine.   Essential hypertension: Blood pressure now well controlled. Continue amlodipine  Replace  losartan  with hydralazine .   Uncontrolled diabetes mellitus with hyperglycemia, with long-term current use of insulin  (HCC) Peripheral neuropathy: Glucose remains elevated. Continue glargine at increased dose. Continue sliding scale corrections Add mealtime insulin . Resume home gabapentin    Normocytic anemia: No bleeding observed or reported. Trend hemoglobin postop.   Dyslipidemia: Continue Lipitor    Hypothyroidism: Continue levothyroxine .   COPD (chronic obstructive pulmonary disease) (HCC) No evidence of flare Continue ICS/LABA.   Mood disorder: Continue bupropion , sertraline , trazodone .   Pancreatic insufficiency: Continue Creon .   Smoking: Smoking cessation recommended Continue nicotine  patch   Thrush: Continue nystatin  swish and swallow   Hyponatremia: Mild, asymptomatic - caution with fluids after surgery - Trend BMP   DVT prophylaxis: Heparin  subcu Code Status: Full code Family Communication: No family at bedside. Disposition Plan:   Status is: Inpatient Remains inpatient appropriate because: Severity of illness.    Consultants:  Orthopedics Vascular surgery  Procedures: Patient underwent successful angiography yesterday. Antimicrobials:  Anti-infectives (From admission, onward)    Start     Dose/Rate Route Frequency Ordered Stop   03/24/24 1800  linezolid  (ZYVOX ) tablet 600 mg        600 mg Oral 2 times daily 03/24/24 0951 03/26/24 1959   03/22/24 1045  methenamine  (HIPREX ) tablet 1 g  Status:  Discontinued        1 g Oral 2 times daily 03/22/24 0947 03/22/24 0947   03/20/24 1000  vancomycin  (VANCOREADY) IVPB 750 mg/150 mL  Status:  Discontinued        750 mg 150 mL/hr over 60 Minutes Intravenous Every 24 hours 03/19/24 0630 03/19/24 1511   03/20/24 1000  vancomycin  (VANCOCIN ) IVPB 1000 mg/200 mL premix  Status:  Discontinued  1,000 mg 200 mL/hr over 60 Minutes Intravenous Every 24 hours 03/19/24 1511 03/24/24 0951   03/19/24 2200   cefTRIAXone  (ROCEPHIN ) 2 g in sodium chloride  0.9 % 100 mL IVPB        2 g 200 mL/hr over 30 Minutes Intravenous Every 24 hours 03/19/24 0413 03/26/24 2159   03/19/24 0630  vancomycin  (VANCOREADY) IVPB 1250 mg/250 mL        1,250 mg 166.7 mL/hr over 90 Minutes Intravenous  Once 03/19/24 0615 03/19/24 0826   03/19/24 0345  cefTRIAXone  (ROCEPHIN ) 2 g in sodium chloride  0.9 % 100 mL IVPB        2 g 200 mL/hr over 30 Minutes Intravenous  Once 03/19/24 0340 03/19/24 9379       Subjective: Patient seen and examined at bedside. Overnight events noted. Patient reports feeling better, she is status post AMPUTATION, third ray right foot .  POD #1. She reports in a lot of pain but improving  Objective: Vitals:   03/25/24 1020 03/25/24 1030 03/25/24 1045 03/25/24 1106  BP: (!) 126/51 (!) 155/61 (!) 162/54 (!) 165/59  Pulse: 64 65 65   Resp: 17 16 15    Temp: 97.6 F (36.4 C)     TempSrc:      SpO2: 93% 93% 96%   Weight:      Height:        Intake/Output Summary (Last 24 hours) at 03/25/2024 1406 Last data filed at 03/25/2024 1300 Gross per 24 hour  Intake 783 ml  Output 10 ml  Net 773 ml   Filed Weights   03/19/24 0645  Weight: 70 kg    Examination:  General exam: Appears calm and comfortable, not in any acute distress. Respiratory system: CTA Bilaterally. Respiratory effort normal. RR 14 Cardiovascular system: S1 & S2 heard, RRR. No JVD, murmurs, rubs, gallops or clicks.  Gastrointestinal system: Abdomen is nondistended, soft and nontender. Normal bowel sounds heard. Central nervous system: Alert and oriented X 3. No focal neurological deficits. Extremities: S/p AMPUTATION, third ray right foot . POD # 1 Skin: No rashes, lesions or ulcers Psychiatry: Judgement and insight appear normal. Mood & affect appropriate.    Data Reviewed: I have personally reviewed following labs and imaging studies  CBC: Recent Labs  Lab 03/18/24 2151 03/19/24 1211 03/20/24 0439  03/22/24 0442 03/23/24 0450 03/24/24 0324 03/25/24 0303  WBC 9.7 6.8 7.1 7.5 7.8 8.5 8.2  NEUTROABS 5.9 4.0  --   --   --   --   --   HGB 9.3* 10.1* 8.9* 9.8* 9.6* 9.5* 8.7*  HCT 28.9* 31.4* 28.0* 29.6* 28.9* 28.9* 26.2*  MCV 93.2 94.6 92.4 90.8 90.9 91.2 91.6  PLT 326 329 311 313 293 306 275   Basic Metabolic Panel: Recent Labs  Lab 03/20/24 0439 03/22/24 0442 03/23/24 0450 03/24/24 0324 03/25/24 0303  NA 135 133* 132* 130* 133*  K 4.7 4.1 4.0 4.1 4.3  CL 104 102 100 102 106  CO2 19* 21* 21* 18* 18*  GLUCOSE 233* 269* 320* 306* 164*  BUN 21 28* 28* 29* 29*  CREATININE 1.36* 1.43* 1.40* 1.62* 1.55*  CALCIUM  8.4* 8.9 8.6* 8.3* 8.5*  MG  --   --   --  1.7 1.7  PHOS  --   --   --  5.0* 4.2   GFR: Estimated Creatinine Clearance: 37 mL/min (A) (by C-G formula based on SCr of 1.55 mg/dL (H)). Liver Function Tests: Recent Labs  Lab 03/18/24 2151 03/22/24  0442  AST 25 18  ALT <5 8  ALKPHOS 97 89  BILITOT 0.5 0.5  PROT 6.8 6.4*  ALBUMIN  3.2* 3.0*   No results for input(s): LIPASE, AMYLASE in the last 168 hours. No results for input(s): AMMONIA in the last 168 hours. Coagulation Profile: No results for input(s): INR, PROTIME in the last 168 hours. Cardiac Enzymes: No results for input(s): CKTOTAL, CKMB, CKMBINDEX, TROPONINI in the last 168 hours. BNP (last 3 results) No results for input(s): PROBNP in the last 8760 hours. HbA1C: No results for input(s): HGBA1C in the last 72 hours. CBG: Recent Labs  Lab 03/25/24 0610 03/25/24 0807 03/25/24 0827 03/25/24 1020 03/25/24 1252  GLUCAP 155* 162* 163* 173* 160*   Lipid Profile: No results for input(s): CHOL, HDL, LDLCALC, TRIG, CHOLHDL, LDLDIRECT in the last 72 hours. Thyroid  Function Tests: No results for input(s): TSH, T4TOTAL, FREET4, T3FREE, THYROIDAB in the last 72 hours. Anemia Panel: No results for input(s): VITAMINB12, FOLATE, FERRITIN, TIBC, IRON,  RETICCTPCT in the last 72 hours. Sepsis Labs: No results for input(s): PROCALCITON, LATICACIDVEN in the last 168 hours.  Recent Results (from the past 240 hours)  Culture, blood (Routine X 2) w Reflex to ID Panel     Status: None   Collection Time: 03/19/24  4:02 PM   Specimen: BLOOD LEFT HAND  Result Value Ref Range Status   Specimen Description BLOOD LEFT HAND  Final   Special Requests   Final    BOTTLES DRAWN AEROBIC AND ANAEROBIC Blood Culture adequate volume   Culture   Final    NO GROWTH 5 DAYS Performed at [facility], [address], [place], [place] [address]    Report Status 03/24/2024 FINAL  Final  Culture, blood (Routine X 2) w Reflex to ID Panel     Status: None   Collection Time: 03/19/24  4:13 PM   Specimen: BLOOD RIGHT ARM  Result Value Ref Range Status   Specimen Description BLOOD RIGHT ARM  Final   Special Requests   Final    BOTTLES DRAWN AEROBIC AND ANAEROBIC Blood Culture adequate volume   Culture   Final    NO GROWTH 5 DAYS Performed at [facility], [address], [place], [place] [address]    Report Status 03/24/2024 FINAL  Final  Surgical pcr screen     Status: None   Collection Time: 03/25/24  5:31 AM   Specimen: Nasal Mucosa; Nasal Swab  Result Value Ref Range Status   MRSA, PCR NEGATIVE NEGATIVE Final   Staphylococcus aureus NEGATIVE NEGATIVE Final    Comment: (NOTE) The Xpert SA Assay (FDA approved for NASAL specimens in patients 18 years of age and older), is one component of a comprehensive surveillance program. It is not intended to diagnose infection nor to guide or monitor treatment. Performed at [facility], [address] [place], [place] [address]      Radiology Studies: DG CHEST PORT 1 VIEW Result Date: 03/23/2024 CLINICAL DATA:  Shortness of breath. EXAM: PORTABLE CHEST 1 VIEW COMPARISON:  Chest radiograph dated 11/27/2022. FINDINGS: No focal consolidation, pleural effusion or pneumothorax. The cardiac  silhouette is within normal limits. No acute osseous pathology. IMPRESSION: No active disease. Electronically Signed   By: Vanetta Chou M.D.   On: 03/23/2024 18:04   Scheduled Meds:  acetaminophen   1,000 mg Oral TID   ALPRAZolam   0.5 mg Oral BID   amLODipine   7.5 mg Oral QPM   aspirin  EC  81 mg Oral Daily   atorvastatin   80 mg Oral q AM   buPROPion   150 mg Oral q morning   clopidogrel   75 mg Oral Q breakfast   fluticasone  furoate-vilanterol  1 puff Inhalation Daily   gabapentin   300 mg Oral TID   hydrALAZINE   25 mg Oral Q8H   insulin  aspart  0-15 Units Subcutaneous TID WC   insulin  aspart  3 Units Subcutaneous TID WC   insulin  glargine  15 Units Subcutaneous QHS   latanoprost   1 drop Both Eyes QHS   levothyroxine   50 mcg Oral QAC breakfast   linezolid   600 mg Oral BID   lipase/protease/amylase  48,000 Units Oral TID with meals   nicotine   14 mg Transdermal Daily   nystatin   5 mL Oral QID   pantoprazole   40 mg Oral BID   saccharomyces boulardii  250 mg Oral BID   sertraline   100 mg Oral q morning   sodium chloride  flush  3 mL Intravenous Q12H   Continuous Infusions:  cefTRIAXone  (ROCEPHIN )  IV 2 g (03/24/24 2213)   promethazine  (PHENERGAN ) injection (IM or IVPB)       LOS: 6 days    Time spent: 35 mins    Darcel Dawley, MD [facility]   If 7PM-7AM, please contact night-coverage

## 2024-03-25 NOTE — Anesthesia Postprocedure Evaluation (Signed)
 Anesthesia Post Note  Patient: Michelle Nicholson  Procedure(s) Performed: AMPUTATION, TOE (Right: Toe)     Patient location during evaluation: PACU Anesthesia Type: MAC Level of consciousness: awake and alert Pain management: pain level controlled Vital Signs Assessment: post-procedure vital signs reviewed and stable Respiratory status: spontaneous breathing, nonlabored ventilation and respiratory function stable Cardiovascular status: stable and blood pressure returned to baseline Postop Assessment: no apparent nausea or vomiting Anesthetic complications: no   No notable events documented.  Last Vitals:  Vitals:   03/25/24 1030 03/25/24 1045  BP: (!) 155/61 (!) 162/54  Pulse: 65 65  Resp: 16 15  Temp:    SpO2: 93% 96%    Last Pain:  Vitals:   03/25/24 1045  TempSrc:   PainSc: 0-No pain        RLE Motor Response: Purposeful movement (03/25/24 1045)        Levelle Edelen,W. EDMOND

## 2024-03-25 NOTE — Transfer of Care (Signed)
 Immediate Anesthesia Transfer of Care Note  Patient: Michelle Nicholson  Procedure(s) Performed: AMPUTATION, TOE (Right: Toe)  Patient Location: PACU  Anesthesia Type:MAC  Level of Consciousness: drowsy  Airway & Oxygen Therapy: Patient Spontanous Breathing  Post-op Assessment: Report given to RN  Post vital signs: Reviewed and stable  Last Vitals:  Vitals Value Taken Time  BP 155/61 03/25/24 10:30  Temp    Pulse 65 03/25/24 10:33  Resp 18 03/25/24 10:33  SpO2 97 % 03/25/24 10:33  Vitals shown include unfiled device data.  Last Pain:  Vitals:   03/25/24 0827  TempSrc: Oral  PainSc: 0-No pain      Patients Stated Pain Goal: 0 (03/20/24 1048)  Complications: No notable events documented.

## 2024-03-25 NOTE — Op Note (Signed)
 03/18/2024 - 03/25/2024  10:02 AM  PATIENT:  Michelle Nicholson    PRE-OPERATIVE DIAGNOSIS:  Gangrene Right 3rd Toe, Osteomyelitis  POST-OPERATIVE DIAGNOSIS:  Same  PROCEDURE:  AMPUTATION, third ray right foot Application Kerecis micro graft 38 cm to cover wound surface volume greater than 50 cm  SURGEON:  Jerona LULLA Sage, MD  PHYSICIAN ASSISTANT:None ANESTHESIA:   General  PREOPERATIVE INDICATIONS:  Michelle Nicholson is a  63 y.o. female with a diagnosis of Gangrene Right 3rd Toe, Osteomyelitis who failed conservative measures and elected for surgical management.    The risks benefits and alternatives were discussed with the patient preoperatively including but not limited to the risks of infection, bleeding, nerve injury, cardiopulmonary complications, the need for revision surgery, among others, and the patient was willing to proceed.  OPERATIVE IMPLANTS:   Implant Name Type Inv. Item Serial No. Manufacturer Lot No. LRB No. Used Action  GRAFT SKIN WND MICRO 38 - [id number] Tissue GRAFT SKIN WND MICRO 38  [employer] [id number] Right 1 Implanted    @ENCIMAGES @  OPERATIVE FINDINGS: Patient had good petechial bleeding tissue margins were clear  OPERATIVE PROCEDURE: Patient brought the operating room and underwent a MAC anesthetic.  After adequate levels anesthesia obtained patient's right lower extremity was prepped using DuraPrep draped into a sterile field a timeout was called.  Patient underwent a local block with 15 cc of 1% lidocaine  plain.  A V incision was made around the third toe and the third ray was resected through the shaft of the metatarsal.  The necrotic tissue and ray were resected in 1 block of tissue.  Electrocautery was used hemostasis.  The wound was irrigated with Vashe.  The wound volume was greater than 50 cm and 38 cm Kerecis micro graft was used to provide soft tissue reinforcement.  The incision was closed using 2-0 nylon.  Sterile dressing was applied patient was taken  the PACU in stable condition.   DISCHARGE PLANNING:  Antibiotic duration: Continue antibiotics for 24 hours  Weightbearing: Weightbearing on the heel as needed  Pain medication: Opioid pathway  Dressing care/ Wound VAC: Dry dressing  Ambulatory devices: Walker and postoperative shoe  Discharge to: Anticipate discharge to home.  Follow-up: In the office 1 week post operative.

## 2024-03-26 DIAGNOSIS — M869 Osteomyelitis, unspecified: Secondary | ICD-10-CM | POA: Diagnosis not present

## 2024-03-26 LAB — BASIC METABOLIC PANEL WITH GFR
Anion gap: 12 (ref 5–15)
BUN: 30 mg/dL — ABNORMAL HIGH (ref 8–23)
CO2: 19 mmol/L — ABNORMAL LOW (ref 22–32)
Calcium: 8.3 mg/dL — ABNORMAL LOW (ref 8.9–10.3)
Chloride: 103 mmol/L (ref 98–111)
Creatinine, Ser: 1.51 mg/dL — ABNORMAL HIGH (ref 0.44–1.00)
GFR, Estimated: 38 mL/min — ABNORMAL LOW (ref >60)
Glucose, Bld: 212 mg/dL — ABNORMAL HIGH (ref 70–99)
Potassium: 4.4 mmol/L (ref 3.5–5.1)
Sodium: 134 mmol/L — ABNORMAL LOW (ref 135–145)

## 2024-03-26 LAB — CBC
HCT: 26.1 % — ABNORMAL LOW (ref 36.0–46.0)
Hemoglobin: 8.5 g/dL — ABNORMAL LOW (ref 12.0–15.0)
MCH: 29.9 pg (ref 26.0–34.0)
MCHC: 32.6 g/dL (ref 30.0–36.0)
MCV: 91.9 fL (ref 80.0–100.0)
Platelets: 275 K/uL (ref 150–400)
RBC: 2.84 MIL/uL — ABNORMAL LOW (ref 3.87–5.11)
RDW: 14 % (ref 11.5–15.5)
WBC: 7.6 K/uL (ref 4.0–10.5)
nRBC: 0 % (ref 0.0–0.2)

## 2024-03-26 LAB — GLUCOSE, CAPILLARY
Glucose-Capillary: 182 mg/dL — ABNORMAL HIGH (ref 70–99)
Glucose-Capillary: 84 mg/dL (ref 70–99)
Glucose-Capillary: 233 mg/dL — ABNORMAL HIGH (ref 70–99)
Glucose-Capillary: 324 mg/dL — ABNORMAL HIGH (ref 70–99)
Glucose-Capillary: 139 mg/dL — ABNORMAL HIGH (ref 70–99)

## 2024-03-26 MED ORDER — SODIUM CHLORIDE 0.9 % IV SOLN: INTRAVENOUS | Status: AC

## 2024-03-26 NOTE — Progress Notes (Signed)
 PROGRESS NOTE    Michelle Nicholson  FMW:[id number] DOB: [birth year] DOA: 03/18/2024 PCP: Darilyn Rosalva Bruckner, PA-C   Brief Narrative:  This 63 y.o. Female with PVD s/p CEA, hx stroke, T1DM, HTN and asthma who presented with a toe infection, found to have osteomyelitis of the 3rd toe. Vascular Surgery consulted.  Patient  underwent angiography yesterday and has improved circulation. She is scheduled to have AMPUTATION, third ray right foot today.  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  ABI was abnormal, so vascular surgery consulted. Continue Rocephin  and vancomycin . Patient underwent successful angiography, patient has brisk Doppler flow right foot. Vascular surgery recommended follow-up in office in 4 to 6 weeks for RLE arterial duplex, aortoiliac and ABI. Patient underwent AMPUTATION, third ray right foot , POD # 1. Tolerated well. Plavix  resumed postsurgery. Ortho recommended outpatient follow-up in 1 week   PAD (peripheral artery disease) (HCC) Chronic limb threatening ischemia: Hyperlipidemia: Patient underwent successful angiography. Continue aspirin , Lipitor . Plavix  resumed postoperatively. She is s/p AMPUTATION, third ray right foot . POD # 1   AKI (acute kidney injury) (HCC) Baseline Cr 1.1, here up to 1.5 in setting of infection/SIRS.   Hold losartan  Continue IV fluids. Creatinine improving Continue to monitor serum creatinine.   Essential hypertension: Blood pressure now well  controlled. Continue amlodipine  Replace losartan  with hydralazine .   Uncontrolled diabetes mellitus with hyperglycemia, with long-term current use of insulin  (HCC) Peripheral neuropathy: Glucose remains elevated. Continue glargine at increased dose. Continue sliding scale corrections Add mealtime insulin . Resume home gabapentin    Normocytic anemia: No bleeding observed or reported.  H&H remains stable. Trend hemoglobin postop.   Dyslipidemia: Continue Lipitor .   Hypothyroidism: Continue levothyroxine .   COPD (chronic obstructive pulmonary disease) (HCC) No evidence of flare. Continue ICS/LABA.   Mood disorder: Continue bupropion , sertraline , trazodone .   Pancreatic insufficiency: Continue Creon .   Smoking: Smoking cessation recommended Continue nicotine  patch   Thrush: Continue nystatin  swish and swallow   Hyponatremia: Mild, asymptomatic - caution with fluids after surgery - Trend BMP   DVT prophylaxis: Heparin  subcu Code Status: Full code Family Communication: No family at bedside. Disposition Plan:   Status is: Inpatient Remains inpatient appropriate because: Severity of illness.    Consultants:  Orthopedics Vascular surgery  Procedures: Patient underwent successful angiography 11/11.  Antimicrobials:  Anti-infectives (From admission, onward)    Start     Dose/Rate Route Frequency Ordered Stop   03/24/24 1800  linezolid  (ZYVOX ) tablet 600 mg        600 mg Oral 2 times daily 03/24/24 0951 03/26/24 0931   03/22/24 1045  methenamine  (HIPREX ) tablet 1 g  Status:  Discontinued        1 g Oral 2 times daily 03/22/24 0947 03/22/24 0947   03/20/24 1000  vancomycin  (VANCOREADY) IVPB 750 mg/150 mL  Status:  Discontinued        750 mg 150 mL/hr over 60 Minutes Intravenous Every 24 hours 03/19/24 0630 03/19/24 1511   03/20/24 1000  vancomycin  (VANCOCIN ) IVPB 1000 mg/200 mL  premix  Status:  Discontinued        1,000 mg 200 mL/hr over 60 Minutes  Intravenous Every 24 hours 03/19/24 1511 03/24/24 0951   03/19/24 2200  cefTRIAXone  (ROCEPHIN ) 2 g in sodium chloride  0.9 % 100 mL IVPB        2 g 200 mL/hr over 30 Minutes Intravenous Every 24 hours 03/19/24 0413 03/25/24 2152   03/19/24 0630  vancomycin  (VANCOREADY) IVPB 1250 mg/250 mL        1,250 mg 166.7 mL/hr over 90 Minutes Intravenous  Once 03/19/24 0615 03/19/24 0826   03/19/24 0345  cefTRIAXone  (ROCEPHIN ) 2 g in sodium chloride  0.9 % 100 mL IVPB        2 g 200 mL/hr over 30 Minutes Intravenous  Once 03/19/24 0340 03/19/24 9379       Subjective: Patient seen and examined at bedside. Overnight events noted. Patient reports feeling better, she is status post AMPUTATION, third ray right foot .  POD #1. She reports still feeling weak and has pain.  She wants to stay 1 more day in hospital.  Objective: Vitals:   03/25/24 1931 03/26/24 0012 03/26/24 0342 03/26/24 0745  BP: (!) 157/58 (!) 132/53 (!) 156/57 (!) 145/54  Pulse: 73 65 69 72  Resp: 18 16 20 15   Temp: 97.9 F (36.6 C) (!) 97.3 F (36.3 C) 97.6 F (36.4 C) 98 F (36.7 C)  TempSrc: Oral Axillary Oral Oral  SpO2: 97% 92% 94% 94%  Weight:      Height:        Intake/Output Summary (Last 24 hours) at 03/26/2024 1104 Last data filed at 03/26/2024 0100 Gross per 24 hour  Intake 752.94 ml  Output --  Net 752.94 ml   Filed Weights   03/19/24 0645  Weight: 70 kg    Examination:  General exam: Appears calm and comfortable, not in any acute distress. Respiratory system: CTA Bilaterally. Respiratory effort normal. RR 13 Cardiovascular system: S1 & S2 heard, RRR. No JVD, murmurs, rubs, gallops or clicks.  Gastrointestinal system: Abdomen is non distended, soft and non tender. Normal bowel sounds heard. Central nervous system: Alert and oriented X 3. No focal neurological deficits. Extremities: S/p AMPUTATION, third ray right foot . POD # 1 Skin: No rashes, lesions or ulcers Psychiatry: Judgement and insight appear  normal. Mood & affect appropriate.    Data Reviewed: I have personally reviewed following labs and imaging studies  CBC: Recent Labs  Lab 03/19/24 1211 03/20/24 0439 03/22/24 0442 03/23/24 0450 03/24/24 0324 03/25/24 0303 03/26/24 0304  WBC 6.8   < > 7.5 7.8 8.5 8.2 7.6  NEUTROABS 4.0  --   --   --   --   --   --   HGB 10.1*   < > 9.8* 9.6* 9.5* 8.7* 8.5*  HCT 31.4*   < > 29.6* 28.9* 28.9* 26.2* 26.1*  MCV 94.6   < > 90.8 90.9 91.2 91.6 91.9  PLT 329   < > 313 293 306 275 275   < > = values in this interval not displayed.   Basic Metabolic Panel: Recent Labs  Lab 03/22/24 0442 03/23/24 0450 03/24/24 0324 03/25/24 0303 03/26/24 0304  NA 133* 132* 130* 133* 134*  K 4.1 4.0 4.1 4.3 4.4  CL 102 100 102 106 103  CO2 21* 21* 18* 18* 19*  GLUCOSE 269* 320* 306* 164* 212*  BUN 28* 28* 29* 29* 30*  CREATININE 1.43* 1.40* 1.62* 1.55* 1.51*  CALCIUM   8.9 8.6* 8.3* 8.5* 8.3*  MG  --   --  1.7 1.7  --   PHOS  --   --  5.0* 4.2  --    GFR: Estimated Creatinine Clearance: 38 mL/min (A) (by C-G formula based on SCr of 1.51 mg/dL (H)). Liver Function Tests: Recent Labs  Lab 03/22/24 0442  AST 18  ALT 8  ALKPHOS 89  BILITOT 0.5  PROT 6.4*  ALBUMIN  3.0*   No results for input(s): LIPASE, AMYLASE in the last 168 hours. No results for input(s): AMMONIA in the last 168 hours. Coagulation Profile: No results for input(s): INR, PROTIME in the last 168 hours. Cardiac Enzymes: No results for input(s): CKTOTAL, CKMB, CKMBINDEX, TROPONINI in the last 168 hours. BNP (last 3 results) No results for input(s): PROBNP in the last 8760 hours. HbA1C: No results for input(s): HGBA1C in the last 72 hours. CBG: Recent Labs  Lab 03/25/24 1550 03/25/24 1715 03/25/24 2125 03/26/24 0527 03/26/24 0918  GLUCAP 240* 233* 102* 182* 84   Lipid Profile: No results for input(s): CHOL, HDL, LDLCALC, TRIG, CHOLHDL, LDLDIRECT in the last 72 hours. Thyroid   Function Tests: No results for input(s): TSH, T4TOTAL, FREET4, T3FREE, THYROIDAB in the last 72 hours. Anemia Panel: No results for input(s): VITAMINB12, FOLATE, FERRITIN, TIBC, IRON, RETICCTPCT in the last 72 hours. Sepsis Labs: No results for input(s): PROCALCITON, LATICACIDVEN in the last 168 hours.  Recent Results (from the past 240 hours)  Culture, blood (Routine X 2) w Reflex to ID Panel     Status: None   Collection Time: 03/19/24  4:02 PM   Specimen: BLOOD LEFT HAND  Result Value Ref Range Status   Specimen Description BLOOD LEFT HAND  Final   Special Requests   Final    BOTTLES DRAWN AEROBIC AND ANAEROBIC Blood Culture adequate volume   Culture   Final    NO GROWTH 5 DAYS Performed at [facility], [address], [place], [place] [address]    Report Status 03/24/2024 FINAL  Final  Culture, blood (Routine X 2) w Reflex to ID Panel     Status: None   Collection Time: 03/19/24  4:13 PM   Specimen: BLOOD RIGHT ARM  Result Value Ref Range Status   Specimen Description BLOOD RIGHT ARM  Final   Special Requests   Final    BOTTLES DRAWN AEROBIC AND ANAEROBIC Blood Culture adequate volume   Culture   Final    NO GROWTH 5 DAYS Performed at [facility], [address] [place], [place] [address]    Report Status 03/24/2024 FINAL  Final  Surgical pcr screen     Status: None   Collection Time: 03/25/24  5:31 AM   Specimen: Nasal Mucosa; Nasal Swab  Result Value Ref Range Status   MRSA, PCR NEGATIVE NEGATIVE Final   Staphylococcus aureus NEGATIVE NEGATIVE Final    Comment: (NOTE) The Xpert SA Assay (FDA approved for NASAL specimens in patients 89 years of age and older), is one component of a comprehensive surveillance program. It is not intended to diagnose infection nor to guide or monitor treatment. Performed at [facility], [address], [place], [place] [address]      Radiology Studies: No results found.  Scheduled  Meds:  acetaminophen   1,000 mg Oral TID   ALPRAZolam   0.5 mg Oral BID   amLODipine   7.5 mg Oral QPM   aspirin  EC  81 mg Oral Daily   atorvastatin   80 mg Oral q AM   buPROPion   150 mg Oral q morning   clopidogrel   75 mg Oral Q breakfast   fluticasone  furoate-vilanterol  1 puff Inhalation Daily   gabapentin   300 mg Oral TID   hydrALAZINE   25 mg Oral Q8H   insulin  aspart  0-15 Units Subcutaneous TID WC   insulin  aspart  3 Units Subcutaneous TID WC   insulin  glargine  15 Units Subcutaneous QHS   latanoprost   1 drop Both Eyes QHS   levothyroxine   50 mcg Oral QAC breakfast   lipase/protease/amylase  48,000 Units Oral TID with meals   nicotine   14 mg Transdermal Daily   nystatin   5 mL Oral QID   pantoprazole   40 mg Oral BID   saccharomyces boulardii  250 mg Oral BID   sertraline   100 mg Oral q morning   sodium chloride  flush  3 mL Intravenous Q12H   Continuous Infusions:  sodium chloride      promethazine  (PHENERGAN ) injection (IM or IVPB)       LOS: 7 days    Time spent: 35 mins    Darcel Dawley, MD [facility]   If 7PM-7AM, please contact night-coverage

## 2024-03-26 NOTE — Progress Notes (Signed)
 OT Cancellation Note  Patient Details Name: Michelle Nicholson MRN: [id number] DOB: [birth year]   Cancelled Treatment:    Reason Eval/Treat Not Completed: OT screened, no needs identified, will sign off. Per PT, pt is largely independent with BADL and has good support at home. No acute needs identified. Please re-consult if change in status. Thank you for this order.   Elma JONETTA Lebron FREDERICK, OTR/L [facility] Acute Rehabilitation Office: [phone]   Elma JONETTA Lebron 03/26/2024, 9:31 AM

## 2024-03-26 NOTE — Progress Notes (Signed)
 Patient ID: Michelle Nicholson, female   DOB: [birth year], 63 y.o.   MRN: [id number] Patient is postoperative day 1 right foot third ray amputation.  Patient may discharge when she is safe with therapy.  The dressing is clean and dry.  Recommended patient have assistance at home to help with ADLs to keep her off her foot as much as possible.  I will follow-up in 1 week.

## 2024-03-26 NOTE — Plan of Care (Signed)

## 2024-03-26 NOTE — Evaluation (Signed)
 Physical Therapy Evaluation Patient Details Name: Michelle Nicholson MRN: [id number] DOB: October 02, 1960 Today's Date: 03/26/2024  History of Present Illness  Pt is a 63 y.o. female presenting 9/5 with R foot infection. S/p aortogram with stenting of femoral artery and common iliac artery 9/10 and amputation third ray R foot on 9/12. PMH: CVA, bil carotid stenosis, PAD, HTN, DM1, tobacco use, reactive airway disease, neuropathy, PVD, COPD.   Clinical Impression  Pt in bed upon arrival of PT, agreeable to evaluation at this time. Prior to admission the pt was completely independent without use of DME or falls. The pt reports she can return home with significant other who has a ramp to enter and bed/bath on main level. Pt abel to complete bed mobility, sit-stand transfers, and short bouts of gait with supervision and use of RW. Pt encouraged to limit wt through RLE per MD instructions, unable to maintain true NWB for more than a few steps then resorting to TDWB through R heel. Pt encouraged to maintain RLE elevation whenever at rest and pt verbalized understanding. May benefit from additional session for stair training if pt were to return home (her house has 4 steps to enter), but stair technique was discussed verbally in session with pt reporting understanding. Will continue to follow acutely to progress strength and activity tolerance for decreased wt on RLE, but pt able to return home with significant other once cleared by MD.      If plan is discharge home, recommend the following: A little help with walking and/or transfers;A little help with bathing/dressing/bathroom;Assistance with cooking/housework;Assist for transportation;Help with stairs or ramp for entrance   Can travel by private vehicle        Equipment Recommendations None recommended by PT (pt has needed DME)  Recommendations for Other Services       Functional Status Assessment Patient has had a recent decline in their functional status and  demonstrates the ability to make significant improvements in function in a reasonable and predictable amount of time.     Precautions / Restrictions Precautions Precautions: Fall Recall of Precautions/Restrictions: Intact Required Braces or Orthoses: Other Brace Other Brace: post op shoe Restrictions Weight Bearing Restrictions Per Provider Order: Yes RLE Weight Bearing Per Provider Order: Partial weight bearing RLE Partial Weight Bearing Percentage or Pounds: through heel only Other Position/Activity Restrictions: Pr Dr. Harden, pt to limit wt bearing as much as possible, up to bathroom as needed but otherwise elevated at all times, educated on NWB and TDWB even though chart has no official wt bearing restrictions      Mobility  Bed Mobility Overal bed mobility: Independent             General bed mobility comments: no assist, slightly increased time    Transfers Overall transfer level: Needs assistance Equipment used: Rolling walker (2 wheels) Transfers: Sit to/from Stand Sit to Stand: Supervision           General transfer comment: supervision for safety in session, cues for use of RW to limit wt bearing    Ambulation/Gait Ambulation/Gait assistance: Supervision Gait Distance (Feet): 15 Feet (+ 20 ft) Assistive device: Rolling walker (2 wheels) Gait Pattern/deviations: Step-to pattern, Decreased step length - left, Decreased stance time - right, Decreased weight shift to right Gait velocity: decreased Gait velocity interpretation: <1.31 ft/sec, indicative of household ambulator   General Gait Details: step to with LLE behind RLE, educated on as little wt as possible on RLE, pt unable to maintain NWB for more  than a few steps. then dependent on TDWB to heel  Stairs Stairs:  (discussed verbally)             Balance Overall balance assessment: Needs assistance Sitting-balance support: No upper extremity supported, Feet supported Sitting balance-Leahy Scale:  Good     Standing balance support: Bilateral upper extremity supported, During functional activity Standing balance-Leahy Scale: Fair Standing balance comment: no UE support for static stance, BUE support to maintain wt bearing in RLE with gait                             Pertinent Vitals/Pain Pain Assessment Pain Assessment: 0-10 Pain Score: 4  Pain Location: R foot (dorsum) Pain Descriptors / Indicators: Discomfort Pain Intervention(s): Limited activity within patient's tolerance, Monitored during session, Repositioned    Home Living Family/patient expects to be discharged to:: Private residence Living Arrangements: Alone Available Help at Discharge: Friend(s);Available PRN/intermittently (can stay with significant) Type of Home: House Home Access: Stairs to enter Entrance Stairs-Rails: Left Entrance Stairs-Number of Steps: 4   Home Layout: Two level;Able to live on main level with bedroom/bathroom Home Equipment: Rolling Walker (2 wheels);Shower seat - built in;Hand held shower head;BSC/3in1;Wheelchair - manual;Cane - quad;Cane - single point Additional Comments: planning to stay with significant other, his house has ramped entrance and main bedroom on main floor. his house has walk in shower with built in seat and handheld shower head. does have a dog    Prior Function Prior Level of Function : Independent/Modified Independent;Driving             Mobility Comments: independent, reports some mild instability from past stroke. no falls, no consistent exercise but active ADLs Comments: independent     Extremity/Trunk Assessment   Upper Extremity Assessment Upper Extremity Assessment: Overall WFL for tasks assessed    Lower Extremity Assessment Lower Extremity Assessment: RLE deficits/detail RLE Deficits / Details: limited ankle mobility due to dressing, reports sensation intact RLE: Unable to fully assess due to pain RLE Sensation: WNL RLE Coordination:  WNL    Cervical / Trunk Assessment Cervical / Trunk Assessment: Normal  Communication   Communication Communication: No apparent difficulties Factors Affecting Communication: Hearing impaired (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)    Cognition Arousal: Alert Behavior During Therapy: WFL for tasks assessed/performed   PT - Cognitive impairments: No apparent impairments                       PT - Cognition Comments: not formally assessed, but functional in session Following commands: Intact       Cueing Cueing Techniques: Verbal cues     General Comments General comments (skin integrity, edema, etc.): VSS on RA    Exercises     Assessment/Plan    PT Assessment Patient needs continued PT services  PT Problem List Decreased strength;Decreased activity tolerance;Decreased balance;Decreased mobility;Pain       PT Treatment Interventions DME instruction;Gait training;Stair training;Functional mobility training;Therapeutic activities;Therapeutic exercise;Balance training;Patient/family education    PT Goals (Current goals can be found in the Care Plan section)  Acute Rehab PT Goals Patient Stated Goal: to return home, have foot heal PT Goal Formulation: With patient Time For Goal Achievement: 04/09/24 Potential to Achieve Goals: Good    Frequency Min 2X/week        AM-PAC PT 6 Clicks Mobility  Outcome Measure Help needed turning from your back to your side while in  a flat bed without using bedrails?: None Help needed moving from lying on your back to sitting on the side of a flat bed without using bedrails?: None Help needed moving to and from a bed to a chair (including a wheelchair)?: A Little Help needed standing up from a chair using your arms (e.g., wheelchair or bedside chair)?: A Little Help needed to walk in hospital room?: A Little Help needed climbing 3-5 steps with a railing? : A Lot 6 Click Score: 19    End of Session Equipment Utilized  During Treatment: Gait belt Activity Tolerance: Patient tolerated treatment well Patient left: in chair;with call bell/phone within reach Nurse Communication: Mobility status;Weight bearing status PT Visit Diagnosis: Unsteadiness on feet (R26.81);Other abnormalities of gait and mobility (R26.89);Muscle weakness (generalized) (M62.81);Pain Pain - Right/Left: Right Pain - part of body: Ankle and joints of foot    Time: 9180-9145 PT Time Calculation (min) (ACUTE ONLY): 35 min   Charges:   PT Evaluation $PT Eval Low Complexity: 1 Low PT Treatments $Gait Training: 8-22 mins PT General Charges $$ ACUTE PT VISIT: 1 Visit         Izetta Call, PT, DPT   Acute Rehabilitation Department Office [phone] Secure Chat Communication Preferred  Izetta JULIANNA Call 03/26/2024, 9:19 AM

## 2024-03-27 DIAGNOSIS — M869 Osteomyelitis, unspecified: Secondary | ICD-10-CM | POA: Diagnosis not present

## 2024-03-27 LAB — GLUCOSE, CAPILLARY
Glucose-Capillary: 175 mg/dL — ABNORMAL HIGH (ref 70–99)
Glucose-Capillary: 298 mg/dL — ABNORMAL HIGH (ref 70–99)

## 2024-03-27 LAB — BASIC METABOLIC PANEL WITH GFR
Anion gap: 11 (ref 5–15)
BUN: 24 mg/dL — ABNORMAL HIGH (ref 8–23)
CO2: 20 mmol/L — ABNORMAL LOW (ref 22–32)
Calcium: 8.5 mg/dL — ABNORMAL LOW (ref 8.9–10.3)
Chloride: 105 mmol/L (ref 98–111)
Creatinine, Ser: 1.52 mg/dL — ABNORMAL HIGH (ref 0.44–1.00)
GFR, Estimated: 38 mL/min — ABNORMAL LOW (ref >60)
Glucose, Bld: 205 mg/dL — ABNORMAL HIGH (ref 70–99)
Potassium: 4.3 mmol/L (ref 3.5–5.1)
Sodium: 136 mmol/L (ref 135–145)

## 2024-03-27 LAB — HEMOGLOBIN AND HEMATOCRIT, BLOOD
HCT: 27.9 % — ABNORMAL LOW (ref 36.0–46.0)
Hemoglobin: 9 g/dL — ABNORMAL LOW (ref 12.0–15.0)

## 2024-03-27 MED ORDER — OXYCODONE HCL 5 MG PO TABS: 5.0000 mg | ORAL_TABLET | ORAL | 0 refills | Status: AC | PRN

## 2024-03-27 MED ORDER — NICOTINE 14 MG/24HR TD PT24: 14.0000 mg | MEDICATED_PATCH | Freq: Every day | TRANSDERMAL | 0 refills | Status: AC

## 2024-03-27 MED ORDER — ASPIRIN 81 MG PO TBEC: 81.0000 mg | DELAYED_RELEASE_TABLET | Freq: Every day | ORAL | 12 refills | Status: AC

## 2024-03-27 MED ORDER — ALPRAZOLAM 0.5 MG PO TABS: 0.5000 mg | ORAL_TABLET | Freq: Two times a day (BID) | ORAL | 0 refills | Status: AC

## 2024-03-27 MED ORDER — SACCHAROMYCES BOULARDII 250 MG PO CAPS: 250.0000 mg | ORAL_CAPSULE | Freq: Two times a day (BID) | ORAL | 0 refills | Status: AC

## 2024-03-27 NOTE — Discharge Summary (Signed)
 Physician Discharge Summary  Michelle Nicholson FMW:[id number] DOB: 1961-05-17 DOA: 03/18/2024  PCP: Darilyn Rosalva Bruckner, PA-C  Admit date: 03/18/2024  Discharge date: 03/27/2024  Admitted From: Home  Disposition:  Home  Recommendations for Outpatient Follow-up:  Follow up with PCP in 1-2 weeks. Please obtain BMP/CBC in one week. Advised to follow-up with orthopedics Dr. Harden in 1 week. Advised nonweightbearing on the right foot, Advised to use heel as needed. Advised to take Xanax  as needed for anxiety attack. Follow up Vascular surgery in office in 4 to 6 weeks for RLE arterial duplex, aortoiliac and ABI.  Home Health:None Equipment/Devices: None  Discharge Condition: Stable CODE STATUS:Full code Diet recommendation: Carb Modified   Brief Summary/ Hospital Course: This 63 y.o. Female with PVD s/p CEA, hx stroke, T1DM, HTN and asthma who presented with a toe infection, found to have osteomyelitis of the 3rd toe. Vascular Surgery consulted. Patient underwent angiography and has improved circulation. Later She underwent AMPUTATION, third ray right foot, She was monitored closely , had anxiety attack, She was started on Xanax .  Patient has made significant improvement ,She has completed antibiotics course.  Podiatry recommended outpatient follow-up in 1 week.  Patient feels better and wants to be discharged.  PT and OT recommended home health services.  Patient being discharged home.   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  ABI was abnormal, so vascular surgery consulted. Continue Rocephin  and vancomycin . Patient underwent successful angiography, patient has brisk Doppler flow right foot. Vascular surgery recommended follow-up in office in 4 to 6 weeks for RLE arterial duplex, aortoiliac and ABI. Patient underwent AMPUTATION, third ray right foot , POD # 2. Tolerated well. Plavix  resumed postsurgery. Ortho recommended outpatient follow-up in 1 week   PAD (peripheral artery disease) (HCC) Chronic limb threatening ischemia: Hyperlipidemia: Patient underwent successful angiography. Continue aspirin , Lipitor . Plavix  resumed postoperatively. She is s/p AMPUTATION, third ray right foot . POD # 2   AKI (acute kidney injury) (HCC) Baseline Cr 1.1, here up to 1.5 in setting of infection/SIRS.   Hold losartan  Continue IV fluids. Creatinine improving Continue to monitor serum creatinine. Follow up Nephrology as an outpatient.   Essential hypertension: Blood pressure now well controlled. Continue amlodipine  Replace losartan  with hydralazine .   Uncontrolled diabetes mellitus with hyperglycemia, with long-term current use of insulin  (HCC) Peripheral neuropathy: Glucose remains elevated. Continue glargine at increased dose. Continue sliding scale corrections Add mealtime insulin . Resume home gabapentin    Normocytic anemia: No bleeding observed or reported.  H&H remains stable.   Dyslipidemia: Continue Lipitor .   Hypothyroidism: Continue levothyroxine .   COPD (chronic obstructive pulmonary disease) (HCC) No evidence of flare. Continue ICS/LABA.   Mood disorder: Continue bupropion , sertraline , trazodone .   Pancreatic insufficiency: Continue Creon .   Smoking: Smoking cessation recommended Continue nicotine  patch   Thrush: Continue nystatin  swish and swallow   Hyponatremia: Mild, asymptomatic Follow up Outpatient.    Discharge Instructions  Discharge Instructions     Call  MD for:  difficulty breathing, headache or visual disturbances   Complete by:  As directed    Call MD for:  persistant dizziness or light-headedness   Complete by: As directed    Call MD for:  persistant nausea and vomiting   Complete by: As directed    Diet Carb Modified   Complete by: As directed    Discharge instructions   Complete by: As directed    Advised to follow-up with primary care physician in 1 week. Advised to follow-up with orthopedics Dr. Harden in 1 week. Advised nonweightbearing on the right foot to use heel as needed. Advised to take Xanax  as needed for anxiety attack.   Discharge wound care:   Complete by: As directed    Orthopedics as scheduled.   Increase activity slowly   Complete by: As directed       Allergies as of 03/27/2024       Reactions   Dorzolamide Hcl-timolol Mal Itching, Other (See Comments)   Makes her eyes burn. Conjunctival Injection with Burning. Visual Acuity Blurry   Brimonidine Rash, Other (See Comments)   Follicular conjunctivitis   Netarsudil-latanoprost  Other (See Comments)   Redness, tearing, burning        Medication List     STOP taking these medications    aspirin  325 MG tablet Replaced by: aspirin  EC 81 MG tablet       TAKE these medications    albuterol  108 (90 Base) MCG/ACT inhaler Commonly known as: VENTOLIN  HFA Inhale 1-2 puffs into the lungs every 6 (six) hours as needed for wheezing or shortness of breath.   ALPRAZolam  0.5 MG tablet Commonly known as: XANAX  Take 1 tablet (0.5 mg total) by mouth 2 (two) times daily for 3 days.   amLODipine  2.5 MG tablet Commonly known as: NORVASC  Take 7.5 mg by mouth every evening.   aspirin  EC 81 MG tablet Take 1 tablet (81 mg total) by mouth daily. Swallow whole. Start taking on: March 28, 2024 Replaces: aspirin  325 MG tablet   atorvastatin  80 MG tablet Commonly known as: LIPITOR  Take 80 mg by mouth in the morning.   buPROPion  150 MG 24 hr tablet Commonly  known as: WELLBUTRIN  XL Take 150 mg by mouth every morning.   CAL-MAG-ZINC PO Take 1 tablet by mouth every morning.   cloNIDine 0.1 MG tablet Commonly known as: CATAPRES Take 0.1 mg by mouth 2 (two) times daily as needed (high BP).   clopidogrel  75 MG tablet Commonly known as: PLAVIX  Take 1 tablet (75 mg total) by mouth daily.   estradiol 0.1 MG/GM vaginal cream Commonly known as: ESTRACE Place 1 Applicatorful vaginally at bedtime as needed (vaginal irritation).   fluorouracil  5 % cream Commonly known as: EFUDEX  Apply 1 application. topically daily as needed (skin cancer breakouts).   FreeStyle Libre 3 Plus Sensor Misc Inject 1 Application into the skin once a week.   Gemtesa 75 MG Tabs Generic drug: Vibegron Take 1 tablet by mouth daily.   Gvoke HypoPen 2-Pack 1 MG/0.2ML Soaj Generic drug: Glucagon Inject 1 mg into the skin as needed (hypoglycemia).   insulin  aspart 100 UNIT/ML FlexPen Commonly known as: NOVOLOG  Inject 4 Units into the skin 3 (three) times daily with meals. Per sliding scale: Inject 4 units subcutaneously three times daily after meals - plus sliding scale adjustment - add 1 unit for every 50 units over 200   insulin  degludec 100 UNIT/ML FlexTouch Pen Commonly known as: Tresiba  FlexTouch Inject 7 Units into the skin daily. What changed:  how much to take when to take this  latanoprost  0.005 % ophthalmic solution Commonly known as: XALATAN  Place 1 drop into both eyes at bedtime.   levothyroxine  50 MCG tablet Commonly known as: SYNTHROID  Take 50 mcg by mouth daily before breakfast.   MAGnesium -Oxide 400 (240 Mg) MG tablet Generic drug: magnesium  oxide Take 1 tablet by mouth daily.   methazolamide  50 MG tablet Commonly known as: NEPTAZANE  Take 50 mg by mouth 2 (two) times daily.   methenamine  1 g tablet Commonly known as: HIPREX  Take 1 g by mouth 2 (two) times daily.   multivitamin with minerals Tabs tablet Take 1 tablet by mouth every  morning.   Neurontin  300 MG capsule Generic drug: gabapentin  Take 300 mg by mouth 3 (three) times daily.   nicotine  14 mg/24hr patch Commonly known as: NICODERM CQ  - dosed in mg/24 hours Place 1 patch (14 mg total) onto the skin daily.   nystatin  100000 UNIT/ML suspension Commonly known as: MYCOSTATIN  Take 5 mLs by mouth 4 (four) times daily as needed (throat pain).   oxyCODONE  5 MG immediate release tablet Commonly known as: Oxy IR/ROXICODONE  Take 1 tablet (5 mg total) by mouth every 4 (four) hours as needed for up to 3 days for severe pain (pain score 7-10).   Pancrelipase  (Lip-Prot-Amyl) 24000-76000 units Cpep Take 2 capsules by mouth in the morning, at noon, and at bedtime.   pantoprazole  40 MG tablet Commonly known as: PROTONIX  Take 40 mg by mouth 2 (two) times daily.   saccharomyces boulardii 250 MG capsule Commonly known as: FLORASTOR Take 1 capsule (250 mg total) by mouth 2 (two) times daily.   sertraline  100 MG tablet Commonly known as: ZOLOFT  Take 100 mg by mouth every morning.   sodium chloride  1 g tablet Take 1 g by mouth daily.   tacrolimus 0.1 % ointment Commonly known as: PROTOPIC Apply 1 Application topically 2 (two) times daily.   traZODone  100 MG tablet Commonly known as: DESYREL  Take 100 mg by mouth at bedtime.   triamcinolone cream 0.1 % Commonly known as: KENALOG Apply 1 Application topically 2 (two) times daily.   valsartan 320 MG tablet Commonly known as: DIOVAN Take 320 mg by mouth daily.   Vitamin D (Ergocalciferol) 1.25 MG (50000 UNIT) Caps capsule Commonly known as: DRISDOL Take 50,000 Units by mouth every Sunday.               Discharge Care Instructions  (From admission, onward)           Start     Ordered   03/27/24 0000  Discharge wound care:       Comments: Orthopedics as scheduled.   03/27/24 1011            Follow-up Information     Harden Jerona GAILS, MD Follow up in 1 week(s).   Specialty: Orthopedic  Surgery Contact information: [address] [place] [address] [phone]         Darilyn Rosalva Bruckner, PA-C Follow up in 1 week(s).   Specialty: Physician Assistant Contact information: [address] [place] [address] [phone]                Allergies  Allergen Reactions   Dorzolamide Hcl-Timolol Mal Itching and Other (See Comments)    Makes her eyes burn. Conjunctival Injection with Burning. Visual Acuity Blurry   Brimonidine Rash and Other (See Comments)    Follicular conjunctivitis   Netarsudil-Latanoprost  Other (See Comments)    Redness, tearing, burning    Consultations: Orthopedics  Procedures/Studies: DG CHEST PORT 1 VIEW Result Date: 03/23/2024 CLINICAL DATA:  Shortness of breath. EXAM: PORTABLE CHEST 1 VIEW COMPARISON:  Chest radiograph dated 11/27/2022. FINDINGS: No focal consolidation, pleural effusion or pneumothorax. The cardiac silhouette is within normal limits. No acute osseous pathology. IMPRESSION: No active disease. Electronically Signed   By: Vanetta Chou M.D.   On: 03/23/2024 18:04   PERIPHERAL VASCULAR CATHETERIZATION Result Date: 03/23/2024 Patient name: Nashayla Telleria MRN: [id number] DOB: [birth year] Sex: female 03/23/2024 Pre-operative Diagnosis: Right lower extremity critical and ischemia with tissue loss at the third toe with gangrene Post-operative diagnosis:  Same Surgeon:  Fonda FORBES Rim, MD Procedure Performed: 1.  Ultrasound-guided micropuncture access of the left common femoral artery in retrograde fashion 2.  Aortogram 3.  Second-order cannulation, right lower extremity angiogram 4.  Balloon lithotripsy 4.5 x 60 mm superficial femoral artery 5.  Drug-eluting stenting superficial femoral artery 6 x 80 mm postdilated with 4 mm balloon to 4.5 mm 6.  Covered stenting right common iliac artery 7 x 19 VBX postdilated to 7.5 mm 7.  Moderate sedation time 52 minutes, contrast volume 130 mL 8.  Device assisted  closure-Mynx Indications: Patient is a 63 year old female who presented to the hospital with critical limb ischemia of the right leg with gangrene of the third toe.  She had depressed ABIs, with depressed toe pressure.  Nonpalpable pulses on exam.  After discussing the risks and benefits of right lower extremity angiogram in an effort to define improve distal perfusion and effort to heal upcoming amputation, Myah elected to proceed. Findings: Aortogram: Widely patent renal arteries bilaterally.  No flow-limiting stenosis in the infrarenal aorta.  The right common iliac artery demonstrated greater than 99% stenosis.  The left common iliac artery demonstrated 40% stenosis.  Bilateral external iliac arteries and hypogastric arteries were normal and widely patent. On the right: Widely patent common femoral artery, profunda.  The superficial femoral artery was patent with mild disease.  There was focal, greater than 95% stenosis at a calcific lesion in the mid superficial femoral artery.  Popliteal artery widely patent.  Two-vessel runoff to the foot anterior tibial and peroneal artery.  The posterior tibial artery was not appreciated with collaterals filling the heel. On the left: Patent common femoral artery with posterior calcific plaque.  Patent profunda, patient superficial femoral artery  Procedure:  The patient was identified in the holding area and taken to room 8.  The patient was then placed supine on the table and prepped and draped in the usual sterile fashion.  A time out was called.  Ultrasound was used to evaluate the left common femoral artery.  It was patent .  A digital ultrasound image was acquired.  A micropuncture needle was used to access the left common femoral artery under ultrasound guidance.  An 018 wire was advanced without resistance and a micropuncture sheath was placed.  The 018 wire was removed and a benson wire was placed.  The micropuncture sheath was exchanged for a 5 french sheath.  An  omniflush catheter was advanced over the wire to the level of L-1.  An abdominal angiogram was obtained.  Next, using the omniflush catheter and a benson wire, the aortic bifurcation was crossed and the catheter was placed into theright external iliac artery and right runoff was obtained.  left runoff was performed via retrograde sheath injections. I elected to attempt intervention on the superficial femoral artery as well as the right common iliac artery.  The patient was administered  heparin  and a 6 x 45 cm sheath was driven across the aortic bifurcation and into the proximal portion of the superficial femoral artery.  The calcific lesion was focal, and appeared eccentric, therefore I elected to attempt treatment with balloon lithotripsy.  Unfortunately, follow-up angiography after multiple pulses demonstrated intimal disruption which appeared flow-limiting.  I elected to stent this lesion using a 6 x 60 mm drug-eluting Eluvia stent followed by a 4.5 mm balloon angioplasty.  Next, my attention turned to the right common iliac artery.  The calcific lesion was not directly at the ostia, therefore I felt comfortable stenting from an antegrade approach.  A 7 mm x 19 mm VBX stent was chosen, laid across the lesion and inflated.  The balloon was inflated to 7.5 mm.  Follow-up angiography demonstrated an excellent result.  On follow-up angiography stenosis was also appreciated in the left common iliac artery.  I am unsure as to whether this was to seeing it in a different view versus iatrogenic from the sheath.  Regardless, the patient had a palpable pulse in the groin and the stenosis appeared less than 40%.  There was no gradient, therefore I elected not to stent. The patient was closed with a minx with manual pressure held after. Impression: Successful iliac artery covered stenting 7 x 19 VBX postdilated to 7.5 mm, successful drug-eluting stenting right superficial femoral artery 6 x 80 mm Eluvia Patient has been  maximally revascularized. Fonda FORBES Rim MD [facility] Office: [phone]   VAS US  ABI WITH/WO TBI Result Date: 03/20/2024  LOWER EXTREMITY DOPPLER STUDY Patient Name:  Michelle Nicholson  Date of Exam:   03/20/2024 Medical Rec #: [id number]   Accession #:    [id number] Date of Birth: 1960/12/01   Patient Gender: F Patient Age:   26 years Exam Location:  [facility] Procedure:      VAS US  ABI WITH/WO TBI Referring Phys: MARCUS DUDA --------------------------------------------------------------------------------  Indications: Gangrene of right 3rd toe. Recent admission to hospital in [facility] with 10 days of antibiotics. Cellulitis right lower              extremity. High Risk Factors: Hypertension, hyperlipidemia, Diabetes, current smoker, prior                    CVA, 2017. Other Factors: COPD.  Vascular Interventions: Left CEA 12/02/21. Comparison Study: Prior ABI done 03/06/23 Performing Technologist: Rachel Pellet RVS  Examination Guidelines: A complete evaluation includes at minimum, Doppler waveform signals and systolic blood pressure reading at the level of bilateral brachial, anterior tibial, and posterior tibial arteries, when vessel segments are accessible. Bilateral testing is considered an integral part of a complete examination. Photoelectric Plethysmograph (PPG) waveforms and toe systolic pressure readings are included as required and additional duplex testing as needed. Limited examinations for reoccurring indications may be performed as noted.  ABI Findings: +---------+------------------+-----+-------------------+--------+ Right    Rt Pressure (mmHg)IndexWaveform           Comment  +---------+------------------+-----+-------------------+--------+ Brachial 186                    multiphasic                 +---------+------------------+-----+-------------------+--------+ PTA      75                0.40 dampened monophasic          +---------+------------------+-----+-------------------+--------+  DP       95                0.51 monophasic                  +---------+------------------+-----+-------------------+--------+ Great Toe51                0.27 Abnormal                    +---------+------------------+-----+-------------------+--------+ +---------+------------------+-----+-----------+-------+ Left     Lt Pressure (mmHg)IndexWaveform   Comment +---------+------------------+-----+-----------+-------+ Brachial 174                    multiphasic        +---------+------------------+-----+-----------+-------+ PTA      163               0.88 biphasic           +---------+------------------+-----+-----------+-------+ DP       173               0.93 biphasic           +---------+------------------+-----+-----------+-------+ Great Toe136               0.73 Normal             +---------+------------------+-----+-----------+-------+ +-------+-----------+-----------+------------+------------+ ABI/TBIToday's ABIToday's TBIPrevious ABIPrevious TBI +-------+-----------+-----------+------------+------------+ Right  0.51       0.27       0.79        0.44         +-------+-----------+-----------+------------+------------+ Left   0.93       0.73       1.05        0.88         +-------+-----------+-----------+------------+------------+ Bilateral ABIs and TBIs appear decreased compared to prior study on 03/05/2024.  Summary: Right: Resting right ankle-brachial index indicates moderate right lower extremity arterial disease. The right toe-brachial index is abnormal.  Left: Resting left ankle-brachial index indicates mild left lower extremity arterial disease. The left toe-brachial index is normal.  *See table(s) above for measurements and observations.  Electronically signed by Gaile New MD on 03/20/2024 at 8:26:37 PM.    Final    DG Foot Complete Right Result Date: 03/18/2024 CLINICAL DATA:   Infection, redness, swelling EXAM: RIGHT FOOT COMPLETE - 3+ VIEW COMPARISON:  None Available. FINDINGS: There is no evidence of fracture or dislocation. There is no evidence of arthropathy or other focal bone abnormality. Soft tissues are unremarkable. No bone destruction to suggest osteomyelitis. IMPRESSION: No acute bony abnormality. Electronically Signed   By: Franky Crease M.D.   On: 03/18/2024 22:11   Subjective: Patient was seen and examined at bedside. Overnight events noted. Patient reports feeling much improved and wants to be discharged home.  Discharge Exam: Vitals:   03/27/24 0800 03/27/24 1200  BP: (!) 161/60 (!) 111/50  Pulse: 72   Resp: 20   Temp: 97.6 F (36.4 C)   SpO2: 98%    Vitals:   03/26/24 2336 03/27/24 0421 03/27/24 0800 03/27/24 1200  BP: (!) 144/54 (!) 153/55 (!) 161/60 (!) 111/50  Pulse: 68 62 72   Resp: 20 19 20    Temp: 97.9 F (36.6 C) 98 F (36.7 C) 97.6 F (36.4 C)   TempSrc: Oral Oral Oral   SpO2: 90% 97% 98%   Weight:      Height:        General: Pt is alert, awake, not in acute distress Cardiovascular: RRR,  S1/S2 +, no rubs, no gallops Respiratory: CTA bilaterally, no wheezing, no rhonchi Abdominal: Soft, NT, ND, bowel sounds + Extremities: no edema, no cyanosis    The results of significant diagnostics from this hospitalization (including imaging, microbiology, ancillary and laboratory) are listed below for reference.     Microbiology: Recent Results (from the past 240 hours)  Culture, blood (Routine X 2) w Reflex to ID Panel     Status: None   Collection Time: 03/19/24  4:02 PM   Specimen: BLOOD LEFT HAND  Result Value Ref Range Status   Specimen Description BLOOD LEFT HAND  Final   Special Requests   Final    BOTTLES DRAWN AEROBIC AND ANAEROBIC Blood Culture adequate volume   Culture   Final    NO GROWTH 5 DAYS Performed at [facility], [address], [place], [place] [address]    Report Status 03/24/2024 FINAL  Final   Culture, blood (Routine X 2) w Reflex to ID Panel     Status: None   Collection Time: 03/19/24  4:13 PM   Specimen: BLOOD RIGHT ARM  Result Value Ref Range Status   Specimen Description BLOOD RIGHT ARM  Final   Special Requests   Final    BOTTLES DRAWN AEROBIC AND ANAEROBIC Blood Culture adequate volume   Culture   Final    NO GROWTH 5 DAYS Performed at [facility], [address], [place], [place] [address]    Report Status 03/24/2024 FINAL  Final  Surgical pcr screen     Status: None   Collection Time: 03/25/24  5:31 AM   Specimen: Nasal Mucosa; Nasal Swab  Result Value Ref Range Status   MRSA, PCR NEGATIVE NEGATIVE Final   Staphylococcus aureus NEGATIVE NEGATIVE Final    Comment: (NOTE) The Xpert SA Assay (FDA approved for NASAL specimens in patients 61 years of age and older), is one component of a comprehensive surveillance program. It is not intended to diagnose infection nor to guide or monitor treatment. Performed at [facility], [address], [place], [place] [address]      Labs: BNP (last 3 results) No results for input(s): BNP in the last 8760 hours. Basic Metabolic Panel: Recent Labs  Lab 03/23/24 0450 03/24/24 0324 03/25/24 0303 03/26/24 0304 03/27/24 0220  NA 132* 130* 133* 134* 136  K 4.0 4.1 4.3 4.4 4.3  CL 100 102 106 103 105  CO2 21* 18* 18* 19* 20*  GLUCOSE 320* 306* 164* 212* 205*  BUN 28* 29* 29* 30* 24*  CREATININE 1.40* 1.62* 1.55* 1.51* 1.52*  CALCIUM  8.6* 8.3* 8.5* 8.3* 8.5*  MG  --  1.7 1.7  --   --   PHOS  --  5.0* 4.2  --   --    Liver Function Tests: Recent Labs  Lab 03/22/24 0442  AST 18  ALT 8  ALKPHOS 89  BILITOT 0.5  PROT 6.4*  ALBUMIN  3.0*   No results for input(s): LIPASE, AMYLASE in the last 168 hours. No results for input(s): AMMONIA in the last 168 hours. CBC: Recent Labs  Lab 03/22/24 0442 03/23/24 0450 03/24/24 0324 03/25/24 0303 03/26/24 0304 03/27/24 0220  WBC 7.5 7.8 8.5 8.2  7.6  --   HGB 9.8* 9.6* 9.5* 8.7* 8.5* 9.0*  HCT 29.6* 28.9* 28.9* 26.2* 26.1* 27.9*  MCV 90.8 90.9 91.2 91.6 91.9  --   PLT 313 293 306 275 275  --    Cardiac Enzymes:  No results for input(s): CKTOTAL, CKMB, CKMBINDEX, TROPONINI in the last 168 hours. BNP: Invalid input(s): POCBNP CBG: Recent Labs  Lab 03/26/24 1150 03/26/24 1708 03/26/24 2119 03/27/24 0615 03/27/24 1147  GLUCAP 233* 324* 139* 175* 298*   D-Dimer No results for input(s): DDIMER in the last 72 hours. Hgb A1c No results for input(s): HGBA1C in the last 72 hours. Lipid Profile No results for input(s): CHOL, HDL, LDLCALC, TRIG, CHOLHDL, LDLDIRECT in the last 72 hours. Thyroid  function studies No results for input(s): TSH, T4TOTAL, T3FREE, THYROIDAB in the last 72 hours.  Invalid input(s): FREET3 Anemia work up No results for input(s): VITAMINB12, FOLATE, FERRITIN, TIBC, IRON, RETICCTPCT in the last 72 hours. Urinalysis    Component Value Date/Time   COLORURINE YELLOW 03/03/2023 0046   APPEARANCEUR HAZY (A) 03/03/2023 0046   LABSPEC 1.010 03/03/2023 0046   PHURINE 5.5 03/03/2023 0046   GLUCOSEU >=500 (A) 03/03/2023 0046   HGBUR NEGATIVE 03/03/2023 0046   BILIRUBINUR NEGATIVE 03/03/2023 0046   KETONESUR NEGATIVE 03/03/2023 0046   PROTEINUR NEGATIVE 03/03/2023 0046   UROBILINOGEN 1.0 12/29/2012 1628   NITRITE POSITIVE (A) 03/03/2023 0046   LEUKOCYTESUR TRACE (A) 03/03/2023 0046   Sepsis Labs Recent Labs  Lab 03/23/24 0450 03/24/24 0324 03/25/24 0303 03/26/24 0304  WBC 7.8 8.5 8.2 7.6   Microbiology Recent Results (from the past 240 hours)  Culture, blood (Routine X 2) w Reflex to ID Panel     Status: None   Collection Time: 03/19/24  4:02 PM   Specimen: BLOOD LEFT HAND  Result Value Ref Range Status   Specimen Description BLOOD LEFT HAND  Final   Special Requests   Final    BOTTLES DRAWN AEROBIC AND ANAEROBIC Blood Culture adequate volume    Culture   Final    NO GROWTH 5 DAYS Performed at [facility], [address], [place], [place] [address]    Report Status 03/24/2024 FINAL  Final  Culture, blood (Routine X 2) w Reflex to ID Panel     Status: None   Collection Time: 03/19/24  4:13 PM   Specimen: BLOOD RIGHT ARM  Result Value Ref Range Status   Specimen Description BLOOD RIGHT ARM  Final   Special Requests   Final    BOTTLES DRAWN AEROBIC AND ANAEROBIC Blood Culture adequate volume   Culture   Final    NO GROWTH 5 DAYS Performed at [facility], [address], [place], [place] [address]    Report Status 03/24/2024 FINAL  Final  Surgical pcr screen     Status: None   Collection Time: 03/25/24  5:31 AM   Specimen: Nasal Mucosa; Nasal Swab  Result Value Ref Range Status   MRSA, PCR NEGATIVE NEGATIVE Final   Staphylococcus aureus NEGATIVE NEGATIVE Final    Comment: (NOTE) The Xpert SA Assay (FDA approved for NASAL specimens in patients 13 years of age and older), is one component of a comprehensive surveillance program. It is not intended to diagnose infection nor to guide or monitor treatment. Performed at [facility], [address] [place], [place] [address]      Time coordinating discharge: Over 30 minutes  SIGNED:   Darcel Dawley, MD  [facility] 03/27/2024, 1:52 PM Pager   If 7PM-7AM, please contact night-coverage

## 2024-03-27 NOTE — Discharge Instructions (Signed)
 Advised to follow-up with primary care physician in 1 week. Advised to follow-up with orthopedics Dr. Harden in 1 week. Advised nonweightbearing on the right foot to use heel as needed. Advised to take Xanax  as needed for anxiety attack.

## 2024-03-27 NOTE — Plan of Care (Signed)

## 2024-03-28 ENCOUNTER — Encounter (HOSPITAL_COMMUNITY): Payer: Self-pay | Admitting: Orthopedic Surgery

## 2024-03-28 ENCOUNTER — Telehealth: Payer: Self-pay | Admitting: Orthopedic Surgery

## 2024-03-28 ENCOUNTER — Other Ambulatory Visit: Payer: Self-pay

## 2024-03-28 DIAGNOSIS — I70229 Atherosclerosis of native arteries of extremities with rest pain, unspecified extremity: Secondary | ICD-10-CM

## 2024-03-28 DIAGNOSIS — I739 Peripheral vascular disease, unspecified: Secondary | ICD-10-CM

## 2024-03-28 DIAGNOSIS — I6522 Occlusion and stenosis of left carotid artery: Secondary | ICD-10-CM

## 2024-03-28 NOTE — Telephone Encounter (Signed)
 Pt wanted to notify leg was red past bandage and she has stayed off foot and some redness went away. Pt has medical questions and asking for a call back about redness and if she needs antibiotics. Please call pt at [phone]

## 2024-03-28 NOTE — Telephone Encounter (Signed)
 Pt had surgery on Friday and states that she noticed redness that was beyond the dressing and thought she could have cellulitis but the redness went away today keeping her leg elevated. I offered appt to see us  in office tomorrow morning to eval and pt states that she will see if she can arrange transportation with a friend but she is not having a temp no other concerns states that area was not red today and thinks it was just due to lack of elevation initially when she got home from the hospital. Will call the office tomorrow and ask for triage nurse if she would like to be worked onto the sch. Advised the pt if she is concerned we would really like to see her in the office.

## 2024-03-29 ENCOUNTER — Telehealth: Payer: Self-pay

## 2024-03-29 NOTE — Telephone Encounter (Signed)
 This patient just called regarding the change of her aspirin  from 325 mg to 81 mg and she wanted to make sure Dr. Lanis approved the change.   Dr. Lanis contacted approval was given.  Patient notified of approval.

## 2024-04-04 ENCOUNTER — Ambulatory Visit (INDEPENDENT_AMBULATORY_CARE_PROVIDER_SITE_OTHER): Admitting: Orthopedic Surgery

## 2024-04-04 ENCOUNTER — Encounter: Payer: Self-pay | Admitting: Orthopedic Surgery

## 2024-04-04 DIAGNOSIS — L02611 Cutaneous abscess of right foot: Secondary | ICD-10-CM

## 2024-04-04 MED ORDER — OXYCODONE-ACETAMINOPHEN 5-325 MG PO TABS: 1.0000 | ORAL_TABLET | Freq: Three times a day (TID) | ORAL | 0 refills | Status: AC | PRN

## 2024-04-04 MED ORDER — DOXYCYCLINE HYCLATE 100 MG PO TABS: 100.0000 mg | ORAL_TABLET | Freq: Two times a day (BID) | ORAL | 0 refills | Status: DC

## 2024-04-04 NOTE — Progress Notes (Signed)
 Office Visit Note   Patient: Michelle Nicholson           Date of Birth: Apr 30, 1961           MRN: [id number] Visit Date: 04/04/2024              Requested by: Darilyn Rosalva Bruckner, PA-C [id number] [place],  [place] [address] PCP: Podraza, Cole Christopher, PA-C  Chief Complaint  Patient presents with   Right Foot - Routine Post Op    03/25/2024 right 3rd toe amputation       HPI: Discussed the use of AI scribe software for clinical note transcription with the patient, who gave verbal consent to proceed.  History of Present Illness Michelle Nicholson is a 63 year old female with neuropathy who presents with concerns about foot cellulitis and pain.  She has a history of foot cellulitis, with most of the redness resolving except for a small area. She has been elevating her foot to heart level for the past week.  She experiences neuropathy in her feet, characterized by a cramping sensation. Recently, she developed throbbing pain in the joint below her toe, which started two days ago and is persistent. This pain differs from her usual neuropathy symptoms that occur at night. She took one oxycodone  last night due to severe pain, having used three out of the six prescribed.  She is uncertain about the healing process and the significance of the remaining redness. She has not yet washed the wound.     Assessment & Plan: Visit Diagnoses:  1. Abscess, toe, right     Plan: Assessment and Plan Assessment & Plan Postoperative left foot wound with residual cellulitis and swelling Residual cellulitis with mild maceration and throbbing pain in the joint below the toe. Differential includes infection versus neuropathy. - Prescribed doxycycline . - Instructed to wash wound with soap and water daily, apply dry gauze, and use ace wrap. - Advised to elevate foot to heart level when sitting or lying down. - Allowed weight-bearing on heel but minimize time on foot. - Prescribed additional  oxycodone  for pain management. - Sent prescriptions to [facility] in [place].  Chronic leg swelling Chronic leg swelling managed with compression wraps. - Continue compression wraps. - Refer to physical therapy for evaluation and strengthening. - Recommend high TED hose for mild compression.      Follow-Up Instructions: Return in about 1 week (around 04/11/2024).   Ortho Exam  Patient is alert, oriented, no adenopathy, well-dressed, normal affect, normal respiratory effort. Physical Exam SKIN: Mild maceration with approximated wound edges.  Previous cellulitis extended up the leg has resolved, with slight redness around the incision.      Imaging: No results found. No images are attached to the encounter.  Labs: Lab Results  Component Value Date   HGBA1C 8.5 (H) 03/19/2024   HGBA1C 7.8 (H) 11/28/2021   HGBA1C 9.0 (H) 04/24/2021   ESRSEDRATE 59 (H) 03/19/2024   CRP 1.0 (H) 03/19/2024   REPTSTATUS 03/24/2024 FINAL 03/19/2024   GRAMSTAIN  03/05/2023    FEW WBC PRESENT,BOTH PMN AND MONONUCLEAR NO ORGANISMS SEEN    GRAMSTAIN  03/05/2023    FEW WBC PRESENT, PREDOMINANTLY PMN NO ORGANISMS SEEN    CULT  03/19/2024    NO GROWTH 5 DAYS Performed at [facility], [address] [place], [place] [address]      Lab Results  Component Value Date   ALBUMIN  3.0 (L) 03/22/2024   ALBUMIN  3.2 (L)  03/18/2024   ALBUMIN  3.8 01/23/2024    Lab Results  Component Value Date   MG 1.7 03/25/2024   MG 1.7 03/24/2024   MG 2.1 04/21/2021   MG 2.2 04/21/2021   No results found for: VD25OH  No results found for: PREALBUMIN    Latest Ref Rng & Units 03/27/2024    2:20 AM 03/26/2024    3:04 AM 03/25/2024    3:03 AM  CBC EXTENDED  WBC 4.0 - 10.5 K/uL  7.6  8.2   RBC 3.87 - 5.11 MIL/uL  2.84  2.86   Hemoglobin 12.0 - 15.0 g/dL 9.0  8.5  8.7   HCT 63.9 - 46.0 % 27.9  26.1  26.2   Platelets 150 - 400 K/uL  275  275      There is no height or weight on file to  calculate BMI.  Orders:  No orders of the defined types were placed in this encounter.  Meds ordered this encounter  Medications   oxyCODONE -acetaminophen  (PERCOCET/ROXICET) 5-325 MG tablet    Sig: Take 1 tablet by mouth every 8 (eight) hours as needed.    Dispense:  30 tablet    Refill:  0   doxycycline  (VIBRA -TABS) 100 MG tablet    Sig: Take 1 tablet (100 mg total) by mouth 2 (two) times daily.    Dispense:  30 tablet    Refill:  0     Procedures: No procedures performed  Clinical Data: No additional findings.  ROS:  All other systems negative, except as noted in the HPI. Review of Systems  Objective: Vital Signs: There were no vitals taken for this visit.  Specialty Comments:  No specialty comments available.  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) 03/19/2024   Arthritis of right hip, degenerative labral tear 03/07/2023   Labral tear of right hip joint 03/07/2023   Right hip pain 03/04/2023   Pain in right hip 03/04/2023   Carotid artery stenosis 12/02/2021   Pyuria 11/29/2021   Preop cardiovascular exam    Acute ischemic stroke (HCC) 11/28/2021   Recurrent UTI 11/28/2021   Internal carotid artery stenosis, left 11/28/2021   Hypokalemia 11/28/2021   Dyslipidemia 11/28/2021   Tobacco abuse 11/28/2021   Fatigue 11/28/2021   Severe sepsis (HCC) 04/21/2021   Shortness of breath 04/21/2021   Acute respiratory failure with hypoxia (HCC) 04/21/2021   Chronic hyponatremia 04/21/2021   AKI (acute kidney injury) (HCC) 04/21/2021   Pneumonia 04/21/2021   Bilateral pneumonia 04/20/2021   Uncontrolled diabetes mellitus with hyperglycemia, with long-term current use of insulin  (HCC) 12/09/2015    Essential hypertension 12/09/2015   Meniere syndrome 12/09/2015   Past Medical History:  Diagnosis Date   Anemia    Arthritis    Arthritis of right hip 03/07/2023   Cancer (HCC)    skin ca, squamous, basal   COPD (chronic obstructive pulmonary disease) (HCC)    Diabetes mellitus    Dyspnea    Family history of adverse reaction to anesthesia    `mother - N/V, Brother N/V   Hypertension    Hypothyroidism    Labral tear of right hip joint 03/07/2023   Neuropathy    Pancreas disorder    Peripheral vascular disease  Pneumonia    04/2021, 2003   Stroke [facility]) 2017   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, equilibrium    Family History  Problem Relation Age of Onset   Cancer Mother    Hypertension Father    Diabetes Father    Cancer Sister    Diabetes Sister    Diabetes Brother    Kidney failure Brother    CAD Brother    Diabetes Other     Past Surgical History:  Procedure Laterality Date   ABDOMINAL AORTOGRAM N/A 03/23/2024   Procedure: ABDOMINAL AORTOGRAM;  Surgeon: Lanis Fonda BRAVO, MD;  Location: [facility] INVASIVE CV LAB;  Service: Cardiovascular;  Laterality: N/A;   AMPUTATION TOE Right 03/25/2024   Procedure: AMPUTATION, TOE;  Surgeon: Harden Jerona GAILS, MD;  Location: [facility] OR;  Service: Orthopedics;  Laterality: Right;  RIGHT 3RD TOE AMPUTATION   CARDIAC CATHETERIZATION     ENDARTERECTOMY Left 12/02/2021   Procedure: LEFT CAROTID ENDARTERECTOMY;  Surgeon: Lanis Fonda BRAVO, MD;  Location: [facility] OR;  Service: Vascular;  Laterality: Left;   LOWER EXTREMITY ANGIOGRAPHY N/A 03/23/2024   Procedure: Lower Extremity Angiography;  Surgeon: Lanis Fonda BRAVO, MD;  Location: [facility] INVASIVE CV LAB;  Service: Cardiovascular;  Laterality: N/A;   LOWER EXTREMITY INTERVENTION  03/23/2024   Procedure: LOWER EXTREMITY INTERVENTION;  Surgeon: Lanis Fonda BRAVO, MD;  Location: [facility] INVASIVE CV LAB;  Service: Cardiovascular;;   PATCH ANGIOPLASTY Left 12/02/2021   Procedure: PATCH ANGIOPLASTY USING [employer]GE [employer] 1cm x 6cm  PATCH;  Surgeon: Lanis Fonda BRAVO, MD;  Location: [facility] OR;  Service: Vascular;  Laterality: Left;   Social History   Occupational History   Not on file  Tobacco Use   Smoking status: Former    Current packs/day: 0.00    Types: Cigarettes    Quit date: 11/27/2021    Years since quitting: 2.3   Smokeless tobacco: Never  Vaping Use   Vaping status: Never Used  Substance and Sexual Activity   Alcohol use: No    Comment: 2 drinks a month   Drug use: No   Sexual activity: Not on file

## 2024-04-12 ENCOUNTER — Ambulatory Visit: Admitting: Orthopedic Surgery

## 2024-04-12 DIAGNOSIS — L02611 Cutaneous abscess of right foot: Secondary | ICD-10-CM

## 2024-04-13 ENCOUNTER — Encounter: Payer: Self-pay | Admitting: Orthopedic Surgery

## 2024-04-13 NOTE — Progress Notes (Signed)
 Office Visit Note   Patient: Michelle Nicholson           Date of Birth: 07/19/1960           MRN: [id number] Visit Date: 04/12/2024              Requested by: Darilyn Rosalva Bruckner, PA-C [id number] [place],  [place] [address] PCP: Podraza, Cole Christopher, PA-C  Chief Complaint  Patient presents with   Right Foot - Routine Post Op    03/25/2024 right 3rd toe amputation       HPI: Discussed the use of AI scribe software for clinical note transcription with the patient, who gave verbal consent to proceed.  History of Present Illness Michelle Nicholson is a 63 year old female who presents for postoperative follow-up after foot surgery.  She is experiencing postoperative pain and redness in the area of her foot surgery. The pain has improved but persists in the area where it is red. If she sits with her leg down for any length of time, the redness returns and the area becomes warm, which she attributes to swelling. She has been diligent in not letting her leg hang down to prevent the wound from pulling apart.  She has been dressing the wound daily, using a washcloth with warm water and antibacterial soap, without submerging the wound in water. She has not showered. She is concerned about removing the Ace bandage and putting weight on her foot.  She is currently on doxycycline  and inquires about the type of infection she had, noting that blood cultures and PCR screen were negative.  She mentions a recent fall in the shower where she landed on her spine, resulting in soreness and a mark on her back.  Concern about the redness being an infection.     Assessment & Plan: Visit Diagnoses:  1. Abscess, toe, right     Plan: Assessment and Plan Assessment & Plan Postoperative foot wound with localized swelling Postoperative foot wound with swelling and redness. Pain improving. Redness and warmth due to swelling, not infection. Negative blood cultures and PCR. - Continue leg  elevation. - Wash wound with soap and water, apply gauze, use Ace wrap. - Perform ankle stretching exercises with towel or sheet. - Allow showering, avoid submerging wound. - Follow up in one week for suture removal.  Back pain due to recent fall Back pain from fall, no serious injury suspected. Pain expected to improve.      Follow-Up Instructions: No follow-ups on file.   Ortho Exam  Patient is alert, oriented, no adenopathy, well-dressed, normal affect, normal respiratory effort. Physical Exam CARDIOVASCULAR: Dorsalis pedis and posterior tibial pulses palpable. EXTREMITIES: Redness and swelling present, no cellulitis, no drainage.      Imaging: No results found. No images are attached to the encounter.  Labs: Lab Results  Component Value Date   HGBA1C 8.5 (H) 03/19/2024   HGBA1C 7.8 (H) 11/28/2021   HGBA1C 9.0 (H) 04/24/2021   ESRSEDRATE 59 (H) 03/19/2024   CRP 1.0 (H) 03/19/2024   REPTSTATUS 03/24/2024 FINAL 03/19/2024   GRAMSTAIN  03/05/2023    FEW WBC PRESENT,BOTH PMN AND MONONUCLEAR NO ORGANISMS SEEN    GRAMSTAIN  03/05/2023    FEW WBC PRESENT, PREDOMINANTLY PMN NO ORGANISMS SEEN    CULT  03/19/2024    NO GROWTH 5 DAYS Performed at [facility], [address], [place], [place] [address]      Lab Results  Component Value  Date   ALBUMIN  3.0 (L) 03/22/2024   ALBUMIN  3.2 (L) 03/18/2024   ALBUMIN  3.8 01/23/2024    Lab Results  Component Value Date   MG 1.7 03/25/2024   MG 1.7 03/24/2024   MG 2.1 04/21/2021   MG 2.2 04/21/2021   No results found for: VD25OH  No results found for: PREALBUMIN    Latest Ref Rng & Units 03/27/2024    2:20 AM 03/26/2024    3:04 AM 03/25/2024    3:03 AM  CBC EXTENDED  WBC 4.0 - 10.5 K/uL  7.6  8.2   RBC 3.87 - 5.11 MIL/uL  2.84  2.86   Hemoglobin 12.0 - 15.0 g/dL 9.0  8.5  8.7   HCT 63.9 - 46.0 % 27.9  26.1  26.2   Platelets 150 - 400 K/uL  275  275      There is no height or weight on file to  calculate BMI.  Orders:  No orders of the defined types were placed in this encounter.  No orders of the defined types were placed in this encounter.    Procedures: No procedures performed  Clinical Data: No additional findings.  ROS:  All other systems negative, except as noted in the HPI. Review of Systems  Objective: Vital Signs: There were no vitals taken for this visit.  Specialty Comments:  No specialty comments available.  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) 03/19/2024   Arthritis of right hip, degenerative labral tear 03/07/2023   Labral tear of right hip joint 03/07/2023   Right hip pain 03/04/2023   Pain in right hip 03/04/2023   Carotid artery stenosis 12/02/2021   Pyuria 11/29/2021   Preop cardiovascular exam    Acute ischemic stroke (HCC) 11/28/2021   Recurrent UTI 11/28/2021   Internal carotid artery stenosis, left 11/28/2021   Hypokalemia 11/28/2021   Dyslipidemia 11/28/2021   Tobacco abuse 11/28/2021   Fatigue 11/28/2021   Severe sepsis (HCC) 04/21/2021   Shortness of breath 04/21/2021   Acute respiratory failure with hypoxia (HCC) 04/21/2021   Chronic hyponatremia 04/21/2021   AKI (acute kidney injury) 04/21/2021   Pneumonia 04/21/2021   Bilateral pneumonia 04/20/2021   Uncontrolled diabetes mellitus with hyperglycemia, with long-term current use of insulin  (HCC) 12/09/2015   Essential hypertension 12/09/2015   Meniere syndrome 12/09/2015   Past Medical History:  Diagnosis Date   Anemia    Arthritis    Arthritis of right hip 03/07/2023   Cancer (HCC)    skin ca, squamous, basal   COPD (chronic obstructive pulmonary disease) (HCC)    Diabetes mellitus    Dyspnea    Family  history of adverse reaction to anesthesia    `mother - N/V, Brother N/V   Hypertension    Hypothyroidism    Labral tear of right hip joint 03/07/2023   Neuropathy    Pancreas disorder    Peripheral vascular disease    Pneumonia    04/2021, 2003   Stroke [facility]) 2017   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, equilibrium    Family History  Problem Relation Age of Onset   Cancer Mother    Hypertension Father    Diabetes Father    Cancer Sister    Diabetes Sister  Diabetes Brother    Kidney failure Brother    CAD Brother    Diabetes Other     Past Surgical History:  Procedure Laterality Date   ABDOMINAL AORTOGRAM N/A 03/23/2024   Procedure: ABDOMINAL AORTOGRAM;  Surgeon: Lanis Fonda BRAVO, MD;  Location: [facility] INVASIVE CV LAB;  Service: Cardiovascular;  Laterality: N/A;   AMPUTATION TOE Right 03/25/2024   Procedure: AMPUTATION, TOE;  Surgeon: Harden Jerona GAILS, MD;  Location: [facility] OR;  Service: Orthopedics;  Laterality: Right;  RIGHT 3RD TOE AMPUTATION   CARDIAC CATHETERIZATION     ENDARTERECTOMY Left 12/02/2021   Procedure: LEFT CAROTID ENDARTERECTOMY;  Surgeon: Lanis Fonda BRAVO, MD;  Location: [facility] OR;  Service: Vascular;  Laterality: Left;   LOWER EXTREMITY ANGIOGRAPHY N/A 03/23/2024   Procedure: Lower Extremity Angiography;  Surgeon: Lanis Fonda BRAVO, MD;  Location: [facility] INVASIVE CV LAB;  Service: Cardiovascular;  Laterality: N/A;   LOWER EXTREMITY INTERVENTION  03/23/2024   Procedure: LOWER EXTREMITY INTERVENTION;  Surgeon: Lanis Fonda BRAVO, MD;  Location: [facility] INVASIVE CV LAB;  Service: Cardiovascular;;   PATCH ANGIOPLASTY Left 12/02/2021   Procedure: PATCH ANGIOPLASTY USING [employer] 1cm x 6cm PATCH;  Surgeon: Lanis Fonda BRAVO, MD;  Location: [facility] OR;  Service: Vascular;  Laterality: Left;   Social History   Occupational History   Not on file  Tobacco Use   Smoking status: Former    Current packs/day: 0.00    Types: Cigarettes    Quit date: 11/27/2021    Years since quitting: 2.3   Smokeless  tobacco: Never  Vaping Use   Vaping status: Never Used  Substance and Sexual Activity   Alcohol use: No    Comment: 2 drinks a month   Drug use: No   Sexual activity: Not on file

## 2024-04-14 ENCOUNTER — Ambulatory Visit: Admitting: Vascular Surgery

## 2024-04-14 ENCOUNTER — Encounter (HOSPITAL_COMMUNITY)

## 2024-04-19 ENCOUNTER — Encounter: Payer: Self-pay | Admitting: Family

## 2024-04-19 ENCOUNTER — Ambulatory Visit: Admitting: Family

## 2024-04-19 DIAGNOSIS — G629 Polyneuropathy, unspecified: Secondary | ICD-10-CM

## 2024-04-19 DIAGNOSIS — I96 Gangrene, not elsewhere classified: Secondary | ICD-10-CM

## 2024-04-19 MED ORDER — GABAPENTIN 300 MG PO CAPS: 600.0000 mg | ORAL_CAPSULE | Freq: Three times a day (TID) | ORAL | 1 refills | Status: AC

## 2024-04-19 NOTE — Progress Notes (Signed)
 Post-Op Visit Note   Patient: Michelle Nicholson           Date of Birth: 06/23/1961           MRN: [id number] Visit Date: 04/19/2024 PCP: Darilyn Rosalva Bruckner, PA-C  Chief Complaint:  Chief Complaint  Patient presents with   Right Foot - Routine Post Op    03/25/2024 right 3rd toe amputation    HPI:  HPI The patient is a 63 year old woman who presents status post third toe amputation.  She has been doing daily Dial soap cleansing and dry dressing changes  She has been having worsening of her neuropathic pain.  She reports that she does take gabapentin  twice daily but this has not been helpful with her pain.  She is previously taking a higher dose but is currently on 300 mg twice daily Ortho Exam On examination right foot the incision is not yet fully healed there is granulation present in the wound bed it remains open about 2 centimeters in length with 1 mm of depth.  There is mild surrounding erythema no warmth no infection  Visit Diagnoses: No diagnosis found.  Plan: Continue daily dose of cleansing.  Dry dressings.  For the neuropathic pain discussed increasing the dose of gabapentin  versus trying Lyrica  today we will increase the dose of gabapentin  and reevaluate in 1 month  Follow-Up Instructions: Return in about 1 week (around 04/26/2024).   Imaging: No results found.  Orders:  No orders of the defined types were placed in this encounter.  Meds ordered this encounter  Medications   gabapentin  (NEURONTIN ) 300 MG capsule    Sig: Take 2 capsules (600 mg total) by mouth 3 (three) times daily.    Dispense:  180 capsule    Refill:  1     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) 03/19/2024   Arthritis of right  hip, degenerative labral tear 03/07/2023   Labral tear of right hip joint 03/07/2023   Right hip pain 03/04/2023   Pain in right hip 03/04/2023   Carotid artery stenosis 12/02/2021   Pyuria 11/29/2021   Preop cardiovascular exam    Acute ischemic stroke (HCC) 11/28/2021   Recurrent UTI 11/28/2021   Internal carotid artery stenosis, left 11/28/2021   Hypokalemia 11/28/2021   Dyslipidemia 11/28/2021   Tobacco abuse 11/28/2021   Fatigue 11/28/2021   Severe sepsis (HCC) 04/21/2021   Shortness of breath 04/21/2021   Acute respiratory failure with hypoxia (HCC) 04/21/2021   Chronic hyponatremia 04/21/2021   AKI (acute kidney injury) 04/21/2021   Pneumonia 04/21/2021   Bilateral pneumonia 04/20/2021   Uncontrolled diabetes mellitus with hyperglycemia, with long-term current use of insulin  (HCC) 12/09/2015   Essential hypertension 12/09/2015   Meniere syndrome 12/09/2015   Past Medical History:  Diagnosis Date   Anemia    Arthritis    Arthritis of right hip 03/07/2023   Cancer (HCC)    skin ca, squamous, basal   COPD (chronic obstructive pulmonary disease) (HCC)    Diabetes mellitus    Dyspnea    Family history of adverse reaction to anesthesia    `mother - N/V, Brother N/V   Hypertension    Hypothyroidism    Labral tear of right hip joint  03/07/2023   Neuropathy    Pancreas disorder    Peripheral vascular disease    Pneumonia    04/2021, 2003   Stroke [facility]) 2017   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, equilibrium    Family History  Problem Relation Age of Onset   Cancer Mother    Hypertension Father    Diabetes Father    Cancer Sister    Diabetes Sister    Diabetes Brother    Kidney failure Brother    CAD Brother    Diabetes Other     Past Surgical History:  Procedure Laterality Date   ABDOMINAL AORTOGRAM N/A 03/23/2024   Procedure: ABDOMINAL AORTOGRAM;  Surgeon: Lanis Fonda BRAVO, MD;  Location: [facility] INVASIVE CV LAB;  Service: Cardiovascular;  Laterality: N/A;   AMPUTATION  TOE Right 03/25/2024   Procedure: AMPUTATION, TOE;  Surgeon: Harden Jerona GAILS, MD;  Location: [facility] OR;  Service: Orthopedics;  Laterality: Right;  RIGHT 3RD TOE AMPUTATION   CARDIAC CATHETERIZATION     ENDARTERECTOMY Left 12/02/2021   Procedure: LEFT CAROTID ENDARTERECTOMY;  Surgeon: Lanis Fonda BRAVO, MD;  Location: [facility] OR;  Service: Vascular;  Laterality: Left;   LOWER EXTREMITY ANGIOGRAPHY N/A 03/23/2024   Procedure: Lower Extremity Angiography;  Surgeon: Lanis Fonda BRAVO, MD;  Location: [facility] INVASIVE CV LAB;  Service: Cardiovascular;  Laterality: N/A;   LOWER EXTREMITY INTERVENTION  03/23/2024   Procedure: LOWER EXTREMITY INTERVENTION;  Surgeon: Lanis Fonda BRAVO, MD;  Location: [facility] INVASIVE CV LAB;  Service: Cardiovascular;;   PATCH ANGIOPLASTY Left 12/02/2021   Procedure: PATCH ANGIOPLASTY USING [employer] 1cm x 6cm PATCH;  Surgeon: Lanis Fonda BRAVO, MD;  Location: [facility] OR;  Service: Vascular;  Laterality: Left;   Social History   Occupational History   Not on file  Tobacco Use   Smoking status: Former    Current packs/day: 0.00    Types: Cigarettes    Quit date: 11/27/2021    Years since quitting: 2.3   Smokeless tobacco: Never  Vaping Use   Vaping status: Never Used  Substance and Sexual Activity   Alcohol use: No    Comment: 2 drinks a month   Drug use: No   Sexual activity: Not on file

## 2024-04-27 ENCOUNTER — Telehealth: Payer: Self-pay

## 2024-04-27 ENCOUNTER — Other Ambulatory Visit: Payer: Self-pay

## 2024-04-27 ENCOUNTER — Ambulatory Visit (INDEPENDENT_AMBULATORY_CARE_PROVIDER_SITE_OTHER): Admitting: Family

## 2024-04-27 DIAGNOSIS — G629 Polyneuropathy, unspecified: Secondary | ICD-10-CM

## 2024-04-27 DIAGNOSIS — T8781 Dehiscence of amputation stump: Secondary | ICD-10-CM

## 2024-04-27 MED ORDER — SULFAMETHOXAZOLE-TRIMETHOPRIM 800-160 MG PO TABS: 1.0000 | ORAL_TABLET | Freq: Two times a day (BID) | ORAL | 0 refills | Status: AC

## 2024-04-27 MED ORDER — NITROGLYCERIN 0.2 MG/HR TD PT24: 0.2000 mg | MEDICATED_PATCH | Freq: Every day | TRANSDERMAL | 1 refills | Status: DC

## 2024-04-27 MED ORDER — SULFAMETHOXAZOLE-TRIMETHOPRIM 800-160 MG PO TABS: 1.0000 | ORAL_TABLET | Freq: Two times a day (BID) | ORAL | 0 refills | Status: DC

## 2024-04-27 MED ORDER — NITROGLYCERIN 0.2 MG/HR TD PT24: 0.2000 mg | MEDICATED_PATCH | Freq: Every day | TRANSDERMAL | 1 refills | Status: AC

## 2024-04-27 NOTE — Telephone Encounter (Signed)
 Pt called stating her incision site is having white drainage and there is redness coming back to her otes. I scheduled pt for today 04/27/24.

## 2024-04-27 NOTE — Progress Notes (Signed)
 Office Visit Note   Patient: Michelle Nicholson           Date of Birth: Jun 13, 1961           MRN: [id number] Visit Date: 04/27/2024              Requested by: Darilyn Rosalva Bruckner, PA-C [id number] [place],  [place] [address] PCP: Podraza, Cole Christopher, PA-C  Chief Complaint  Patient presents with   Right Foot - Routine Post Op    03/25/2024 right 3rd toe amputation       HPI: The patient is a 62 year old woman seen status post right third toe amputation September 12 she has been packing open with silver cell she complains of increased redness and drainage she is concerned it may have an odor.  She has unfortunately been up on her feet more in the last 2 days No fever no chills Assessment & Plan: Visit Diagnoses: No diagnosis found.  Plan: She will begin using a nitroglycerin patch continue cleansing with Vashe packing open with silver.  Will resume oral antibiotics  Follow-Up Instructions: No follow-ups on file.   Ortho Exam  Patient is alert, oriented, no adenopathy, well-dressed, normal affect, normal respiratory effort. On examination right foot the incision has dehisced this is open about 3 mm wide there is flat pink tissue in the wound bed this does not probe to bone today there is mild surrounding maceration there is no erythema no purulence Strong dorsalis pedis and posterior tibial pulses palpable   Imaging: No results found. No images are attached to the encounter.  Labs: Lab Results  Component Value Date   HGBA1C 8.5 (H) 03/19/2024   HGBA1C 7.8 (H) 11/28/2021   HGBA1C 9.0 (H) 04/24/2021   ESRSEDRATE 59 (H) 03/19/2024   CRP 1.0 (H) 03/19/2024   REPTSTATUS 03/24/2024 FINAL 03/19/2024   GRAMSTAIN  03/05/2023    FEW WBC PRESENT,BOTH PMN AND MONONUCLEAR NO ORGANISMS SEEN    GRAMSTAIN  03/05/2023    FEW WBC PRESENT, PREDOMINANTLY PMN NO ORGANISMS SEEN    CULT  03/19/2024    NO GROWTH 5 DAYS Performed at [facility] Lab, [address] [place], [place] [address]      Lab Results  Component Value Date   ALBUMIN  3.0 (L) 03/22/2024   ALBUMIN  3.2 (L) 03/18/2024   ALBUMIN  3.8 01/23/2024    Lab Results  Component Value Date   MG 1.7 03/25/2024   MG 1.7 03/24/2024   MG 2.1 04/21/2021   MG 2.2 04/21/2021   No results found for: VD25OH  No results found for: PREALBUMIN    Latest Ref Rng & Units 03/27/2024    2:20 AM 03/26/2024    3:04 AM 03/25/2024    3:03 AM  CBC EXTENDED  WBC 4.0 - 10.5 K/uL  7.6  8.2   RBC 3.87 - 5.11 MIL/uL  2.84  2.86   Hemoglobin 12.0 - 15.0 g/dL 9.0  8.5  8.7   HCT 63.9 - 46.0 % 27.9  26.1  26.2   Platelets 150 - 400 K/uL  275  275      There is no height or weight on file to calculate BMI.  Orders:  No orders of the defined types were placed in this encounter.  Meds ordered this encounter  Medications   sulfamethoxazole-trimethoprim (BACTRIM DS) 800-160 MG tablet    Sig: Take 1 tablet by mouth 2 (two) times daily.    Dispense:  20 tablet  Refill:  0     Procedures: No procedures performed  Clinical Data: No additional findings.  ROS:  All other systems negative, except as noted in the HPI. Review of Systems  Objective: Vital Signs: There were no vitals taken for this visit.  Specialty Comments:  No specialty comments available.  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) 03/19/2024   Arthritis of right hip, degenerative labral tear 03/07/2023   Labral tear of right hip joint 03/07/2023   Right hip pain 03/04/2023   Pain in right hip 03/04/2023   Carotid artery stenosis 12/02/2021   Pyuria 11/29/2021   Preop cardiovascular exam    Acute ischemic stroke (HCC) 11/28/2021   Recurrent UTI  11/28/2021   Internal carotid artery stenosis, left 11/28/2021   Hypokalemia 11/28/2021   Dyslipidemia 11/28/2021   Tobacco abuse 11/28/2021   Fatigue 11/28/2021   Severe sepsis (HCC) 04/21/2021   Shortness of breath 04/21/2021   Acute respiratory failure with hypoxia (HCC) 04/21/2021   Chronic hyponatremia 04/21/2021   AKI (acute kidney injury) 04/21/2021   Pneumonia 04/21/2021   Bilateral pneumonia 04/20/2021   Uncontrolled diabetes mellitus with hyperglycemia, with long-term current use of insulin  (HCC) 12/09/2015   Essential hypertension 12/09/2015   Meniere syndrome 12/09/2015   Past Medical History:  Diagnosis Date   Anemia    Arthritis    Arthritis of right hip 03/07/2023   Cancer (HCC)    skin ca, squamous, basal   COPD (chronic obstructive pulmonary disease) (HCC)    Diabetes mellitus    Dyspnea    Family history of adverse reaction to anesthesia    `mother - N/V, Brother N/V   Hypertension    Hypothyroidism    Labral tear of right hip joint 03/07/2023   Neuropathy    Pancreas disorder    Peripheral vascular disease    Pneumonia    04/2021, 2003   Stroke [facility]) 2017   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, equilibrium    Family History  Problem Relation Age of Onset   Cancer Mother    Hypertension Father    Diabetes Father    Cancer Sister    Diabetes Sister    Diabetes Brother    Kidney failure Brother    CAD Brother    Diabetes Other     Past Surgical History:  Procedure Laterality Date   ABDOMINAL AORTOGRAM N/A 03/23/2024   Procedure: ABDOMINAL AORTOGRAM;  Surgeon: Lanis Fonda BRAVO, MD;  Location: [facility] INVASIVE CV LAB;  Service: Cardiovascular;  Laterality: N/A;   AMPUTATION TOE Right 03/25/2024   Procedure: AMPUTATION, TOE;  Surgeon: Harden Jerona GAILS, MD;  Location: [facility] OR;  Service: Orthopedics;  Laterality: Right;  RIGHT 3RD TOE AMPUTATION   CARDIAC CATHETERIZATION     ENDARTERECTOMY Left 12/02/2021   Procedure: LEFT CAROTID ENDARTERECTOMY;  Surgeon: Lanis Fonda BRAVO, MD;  Location: [facility] OR;  Service: Vascular;  Laterality: Left;   LOWER EXTREMITY ANGIOGRAPHY N/A 03/23/2024   Procedure: Lower Extremity Angiography;  Surgeon: Lanis Fonda BRAVO, MD;  Location: [facility] INVASIVE CV LAB;  Service: Cardiovascular;  Laterality: N/A;   LOWER EXTREMITY INTERVENTION  03/23/2024   Procedure: LOWER EXTREMITY INTERVENTION;  Surgeon: Lanis Fonda BRAVO, MD;  Location: [facility] INVASIVE CV LAB;  Service: Cardiovascular;;   PATCH ANGIOPLASTY Left 12/02/2021   Procedure: PATCH ANGIOPLASTY USING [employer] 1cm x 6cm PATCH;  Surgeon: Lanis Fonda BRAVO, MD;  Location: [facility] OR;  Service: Vascular;  Laterality: Left;   Social History   Occupational History   Not on file  Tobacco Use   Smoking status: Former    Current packs/day: 0.00    Types: Cigarettes    Quit date: 11/27/2021    Years since quitting: 2.4   Smokeless tobacco: Never  Vaping Use   Vaping status: Never Used  Substance and Sexual Activity   Alcohol use: No    Comment: 2 drinks a month   Drug use: No   Sexual activity: Not on file

## 2024-04-28 ENCOUNTER — Ambulatory Visit (HOSPITAL_BASED_OUTPATIENT_CLINIC_OR_DEPARTMENT_OTHER)
Admit: 2024-04-28 | Discharge: 2024-04-28 | Disposition: A | Discharge status: Home / Self Care | Attending: Vascular Surgery | Admitting: Vascular Surgery

## 2024-04-28 ENCOUNTER — Ambulatory Visit: Admitting: Orthopedic Surgery

## 2024-04-28 ENCOUNTER — Encounter: Admitting: Physician Assistant

## 2024-04-28 ENCOUNTER — Ambulatory Visit (HOSPITAL_COMMUNITY)
Admission: RE | Admit: 2024-04-28 | Discharge: 2024-04-28 | Disposition: A | Discharge status: Home / Self Care | Source: Ambulatory Visit | Attending: Vascular Surgery | Admitting: Vascular Surgery

## 2024-04-28 ENCOUNTER — Ambulatory Visit (INDEPENDENT_AMBULATORY_CARE_PROVIDER_SITE_OTHER): Admitting: Physician Assistant

## 2024-04-28 VITALS — BP 162/68 | HR 69 | Temp 97.7°F | Wt 155.0 lb

## 2024-04-28 DIAGNOSIS — I70235 Atherosclerosis of native arteries of right leg with ulceration of other part of foot: Secondary | ICD-10-CM | POA: Insufficient documentation

## 2024-04-28 DIAGNOSIS — T8189XA Other complications of procedures, not elsewhere classified, initial encounter: Secondary | ICD-10-CM | POA: Insufficient documentation

## 2024-04-28 DIAGNOSIS — I6522 Occlusion and stenosis of left carotid artery: Secondary | ICD-10-CM | POA: Diagnosis not present

## 2024-04-28 DIAGNOSIS — I739 Peripheral vascular disease, unspecified: Secondary | ICD-10-CM | POA: Insufficient documentation

## 2024-04-28 DIAGNOSIS — I70229 Atherosclerosis of native arteries of extremities with rest pain, unspecified extremity: Secondary | ICD-10-CM

## 2024-04-28 LAB — VAS US ABI WITH/WO TBI
Left ABI: 1.13
Right ABI: 0.95

## 2024-04-28 NOTE — Progress Notes (Signed)
 Office Note   History of Present Illness   Michelle Nicholson is a 63 y.o. ([birth year]) female who presents for post angio follow-up.  She recently underwent right lower extremity angiogram with right common iliac artery stenting, SFA balloon lithotripsy, and drug eluted stenting of the SFA by Dr. Lanis on 03/23/2024.  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 Harden on 03/25/2024.  She returns today for follow-up.  She says that she has been having a difficult time recently.  Her brother passed away in the past couple of weeks.  She also reports that her third toe amputation site has not been healing well.  She notes a couple of days ago her incision opened up and had a foul smell to it.  She also recently started developing some redness around her incision site.  She denies any fevers or chills.  She denies any drainage from her incision.  She saw Dr.Duda's office yesterday and they placed her on antibiotics and recommended use of nitroglycerin patches on her foot to improve her blood flow.  Currently she is changing the bandage on her right foot daily and cleans her wound with Vashe.  She denies any rest pain.  She is taking her aspirin , Plavix , and statin.  Current Outpatient Medications  Medication Sig Dispense Refill   albuterol  (VENTOLIN  HFA) 108 (90 Base) MCG/ACT inhaler Inhale 1-2 puffs into the lungs every 6 (six) hours as needed for wheezing or shortness of breath.     amLODipine  (NORVASC ) 2.5 MG tablet Take 7.5 mg by mouth every evening.     aspirin  EC 81 MG tablet Take 1 tablet (81 mg total) by mouth daily. Swallow whole. 30 tablet 12   atorvastatin  (LIPITOR ) 80 MG tablet Take 80 mg by mouth in the morning.     buPROPion  (WELLBUTRIN  XL) 150 MG 24 hr tablet Take 150 mg by mouth every morning.     Calcium -Magnesium -Zinc (CAL-MAG-ZINC PO) Take 1 tablet by mouth every morning.     cloNIDine (CATAPRES) 0.1 MG tablet  Take 0.1 mg by mouth 2 (two) times daily as needed (high BP).     clopidogrel  (PLAVIX ) 75 MG tablet Take 1 tablet (75 mg total) by mouth daily. 30 tablet 0   Continuous Glucose Sensor (FREESTYLE LIBRE 3 PLUS SENSOR) MISC Inject 1 Application into the skin once a week.     doxycycline  (VIBRA -TABS) 100 MG tablet Take 1 tablet (100 mg total) by mouth 2 (two) times daily. 30 tablet 0   estradiol (ESTRACE) 0.1 MG/GM vaginal cream Place 1 Applicatorful vaginally at bedtime as needed (vaginal irritation).     fluorouracil  (EFUDEX ) 5 % cream Apply 1 application. topically daily as needed (skin cancer breakouts).     gabapentin  (NEURONTIN ) 300 MG capsule Take 2 capsules (600 mg total) by mouth 3 (three) times daily. 180 capsule 1   GEMTESA 75 MG TABS Take 1 tablet by mouth daily.     Glucagon (GVOKE HYPOPEN 2-PACK) 1 MG/0.2ML SOAJ Inject 1 mg into the skin as needed (hypoglycemia).     insulin  aspart (NOVOLOG ) 100 UNIT/ML FlexPen Inject 4 Units into the skin 3 (three) times daily with meals. Per sliding scale: Inject 4 units subcutaneously three times daily after meals - plus sliding scale adjustment - add 1 unit for every 50 units over 200     Insulin  Degludec (TRESIBA  FLEXTOUCH) 100 UNIT/ML SOPN Inject 7 Units into the skin  daily. (Patient taking differently: Inject 15 Units into the skin daily after breakfast.)     latanoprost  (XALATAN ) 0.005 % ophthalmic solution Place 1 drop into both eyes at bedtime.     levothyroxine  (SYNTHROID ) 50 MCG tablet Take 50 mcg by mouth daily before breakfast.     MAGNESIUM -OXIDE 400 (240 Mg) MG tablet Take 1 tablet by mouth daily.     methazolamide  (NEPTAZANE ) 50 MG tablet Take 50 mg by mouth 2 (two) times daily.     methenamine  (HIPREX ) 1 g tablet Take 1 g by mouth 2 (two) times daily.     Multiple Vitamin (MULTIVITAMIN WITH MINERALS) TABS tablet Take 1 tablet by mouth every morning.     nitroGLYCERIN (NITRODUR - DOSED IN MG/24 HR) 0.2 mg/hr patch Place 1 patch (0.2 mg  total) onto the skin daily. 30 patch 1   nystatin  (MYCOSTATIN ) 100000 UNIT/ML suspension Take 5 mLs by mouth 4 (four) times daily as needed (throat pain).     oxyCODONE -acetaminophen  (PERCOCET/ROXICET) 5-325 MG tablet Take 1 tablet by mouth every 8 (eight) hours as needed. 30 tablet 0   Pancrelipase , Lip-Prot-Amyl, 24000-76000 units CPEP Take 2 capsules by mouth in the morning, at noon, and at bedtime.     pantoprazole  (PROTONIX ) 40 MG tablet Take 40 mg by mouth 2 (two) times daily.     sertraline  (ZOLOFT ) 100 MG tablet Take 100 mg by mouth every morning.     sodium chloride  1 g tablet Take 1 g by mouth daily.     sulfamethoxazole-trimethoprim (BACTRIM DS) 800-160 MG tablet Take 1 tablet by mouth 2 (two) times daily. 20 tablet 0   tacrolimus (PROTOPIC) 0.1 % ointment Apply 1 Application topically 2 (two) times daily.     traZODone  (DESYREL ) 100 MG tablet Take 100 mg by mouth at bedtime.     triamcinolone cream (KENALOG) 0.1 % Apply 1 Application topically 2 (two) times daily.     valsartan (DIOVAN) 320 MG tablet Take 320 mg by mouth daily.     Vitamin D, Ergocalciferol, (DRISDOL) 1.25 MG (50000 UNIT) CAPS capsule Take 50,000 Units by mouth every Sunday.     No current facility-administered medications for this visit.    REVIEW OF SYSTEMS (negative unless checked):   Cardiac:  []  Chest pain or chest pressure? []  Shortness of breath upon activity? []  Shortness of breath when lying flat? []  Irregular heart rhythm?  Vascular:  []  Pain in calf, thigh, or hip brought on by walking? []  Pain in feet at night that wakes you up from your sleep? []  Blood clot in your veins? []  Leg swelling?  Pulmonary:  []  Oxygen at home? []  Productive cough? []  Wheezing?  Neurologic:  []  Sudden weakness in arms or legs? []  Sudden numbness in arms or legs? []  Sudden onset of difficult speaking or slurred speech? []  Temporary loss of vision in one eye? []  Problems with dizziness?  Gastrointestinal:   []  Blood in stool? []  Vomited blood?  Genitourinary:  []  Burning when urinating? []  Blood in urine?  Psychiatric:  []  Major depression  Hematologic:  []  Bleeding problems? []  Problems with blood clotting?  Dermatologic:  []  Rashes or ulcers?  Constitutional:  []  Fever or chills?  Ear/Nose/Throat:  []  Change in hearing? []  Nose bleeds? []  Sore throat?  Musculoskeletal:  []  Back pain? []  Joint pain? []  Muscle pain?   Physical Examination   Vitals:   04/28/24 0943  BP: (!) 162/68  Pulse: 69  Temp: 97.7 F (36.5 C)  TempSrc: Temporal  Weight: 155 lb (70.3 kg)   Body mass index is 23.57 kg/m.  General:  WDWN in NAD; vital signs documented above Gait: Not observed HENT: WNL, normocephalic Pulmonary: normal non-labored breathing , without rales, rhonchi,  wheezing Cardiac: regular Abdomen: soft, NT, no masses Skin: without rashes Vascular Exam/Pulses: Palpable right femoral pulse.  Nonpalpable right popliteal or pedal pulses.  Brisk monophasic right DP and peroneal Doppler signals Extremities: Dehiscence of right third toe amputation site with slight surrounding erythema Musculoskeletal: no muscle wasting or atrophy  Neurologic: A&O X 3;  No focal weakness or paresthesias are detected Psychiatric:  The pt has Normal affect.    Non-Invasive Vascular imaging   ABI (04/28/2024) R:  ABI: 0.95 (0.51),  PT: mono DP: mono TBI: 0.4 L:  ABI: 1.13 (0.93),  PT: tri DP: tri TBI: 0.83  Aortoiliac Duplex (04/28/2024) +-------------------+--------+--------------+--------+--------+  Common Iliac ArteryPSV cm/sStenosis      WaveformComments  +-------------------+--------+--------------+--------+--------+  Prox to Stent      125                   biphasic          +-------------------+--------+--------------+--------+--------+  Proximal Stent     165                   biphasic           +-------------------+--------+--------------+--------+--------+  Mid Stent          175                   biphasic          +-------------------+--------+--------------+--------+--------+  Distal Stent       185                   biphasic          +-------------------+--------+--------------+--------+--------+  Distal to Stent    238     1-49% stenosisbiphasic          +-------------------+--------+--------------+--------+--------+   RLE Arterial Duplex (04/28/2024) +----------+--------+-----+---------------+----------+--------------+  RIGHT    PSV cm/sRatioStenosis       Waveform  Comments        +----------+--------+-----+---------------+----------+--------------+  CFA Prox  190          30-49% stenosisbiphasic  high end range  +----------+--------+-----+---------------+----------+--------------+  DFA      116                         biphasic                  +----------+--------+-----+---------------+----------+--------------+  SFA Prox  253     1.6  30-49% stenosismonophasichigh end range  +----------+--------+-----+---------------+----------+--------------+  SFA Mid   138                         monophasic                +----------+--------+-----+---------------+----------+--------------+  POP Prox  249          30-49% stenosismonophasichigh end range  +----------+--------+-----+---------------+----------+--------------+  POP Mid   115                         biphasic                  +----------+--------+-----+---------------+----------+--------------+  POP [id number]          50-74% stenosismonophasic                +----------+--------+-----+---------------+----------+--------------+  TP Trunk  179          30-49% stenosis                          +----------+--------+-----+---------------+----------+--------------+    Patent right SFA stent without stenosis  Carotid duplex (04/28/2024) 1 to  39% right ICA stenosis.  Patent left carotid endarterectomy site without restenosis  Medical Decision Making   Michelle Nicholson is a 63 y.o. female who presents for post angio follow-up  Based on the patient's studies, her ABIs on the right appear improved from 0.51 to 0.95.  Her toe pressure on the right has increased from 51 to 59 mmHg.  Aortoiliac duplex demonstrates a patent common iliac artery stent without stenosis.  There appears to be mild stenosis in the iliac artery just past the stent.  This does not appear to be flow-limiting. Right lower extremity arterial duplex demonstrates a patent right SFA stent without stenosis.  She does have diffuse atherosclerosis throughout the right lower extremity.  Most of her disease does not appear flow-limiting, except for some disease in the distal popliteal artery.  There is 50 to 74% stenosis within this artery, with a PSV of 384 cm/s. Carotid duplex demonstrates 1 to 39% right ICA stenosis.  She has a patent left carotid endarterectomy site without restenosis On exam she has a palpable right femoral pulse.  She has nonpalpable right popliteal and pedal pulses.  She does have brisk right DP and peroneal monophasic Doppler signals.  Her right third toe amputation site has dehisced recently, likely due to insufficient blood flow.  There does appear to be possible cellulitis of the skin surrounding her amputation.  She was just placed on antibiotics.  I have reviewed the patient's studies.  I discussed with the patient that her blood flow in the right lower extremity does appear improved after her recent intervention, however it remains marginal in terms of wound healing.  She could continue with local wound care with hopes of healing her amputation site.  I have also offered repeat angiography for the patient, given that there appears to be some popliteal stenosis amenable for intervention.  I have explained to the patient that ultimately even with repeat  intervention, there is a moderate chance that she still does not heal her amputation site.  She understands that she is at risk for more proximal amputation.  She would like to try another angiogram again in efforts to heal her amputation site. She will continue local wound care to the right foot.  She will continue her aspirin , Plavix , and statin.  We can schedule the patient for abdominal aortogram with right lower extremity runoff and possible popliteal and/or tibial intervention on Monday with Dr. Sheree.   Ahmed Holster PA-C [facility] Office: [phone]  Clinic MD: Lanis

## 2024-04-28 NOTE — H&P (View-Only) (Signed)
 Office Note   History of Present Illness   Michelle Nicholson is a 63 y.o. ([birth year]) female who presents for post angio follow-up.  She recently underwent right lower extremity angiogram with right common iliac artery stenting, SFA balloon lithotripsy, and drug eluted stenting of the SFA by Dr. Lanis on 03/23/2024.  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 Harden on 03/25/2024.  She returns today for follow-up.  She says that she has been having a difficult time recently.  Her brother passed away in the past couple of weeks.  She also reports that her third toe amputation site has not been healing well.  She notes a couple of days ago her incision opened up and had a foul smell to it.  She also recently started developing some redness around her incision site.  She denies any fevers or chills.  She denies any drainage from her incision.  She saw Dr.Duda's office yesterday and they placed her on antibiotics and recommended use of nitroglycerin patches on her foot to improve her blood flow.  Currently she is changing the bandage on her right foot daily and cleans her wound with Vashe.  She denies any rest pain.  She is taking her aspirin , Plavix , and statin.  Current Outpatient Medications  Medication Sig Dispense Refill   albuterol  (VENTOLIN  HFA) 108 (90 Base) MCG/ACT inhaler Inhale 1-2 puffs into the lungs every 6 (six) hours as needed for wheezing or shortness of breath.     amLODipine  (NORVASC ) 2.5 MG tablet Take 7.5 mg by mouth every evening.     aspirin  EC 81 MG tablet Take 1 tablet (81 mg total) by mouth daily. Swallow whole. 30 tablet 12   atorvastatin  (LIPITOR ) 80 MG tablet Take 80 mg by mouth in the morning.     buPROPion  (WELLBUTRIN  XL) 150 MG 24 hr tablet Take 150 mg by mouth every morning.     Calcium -Magnesium -Zinc (CAL-MAG-ZINC PO) Take 1 tablet by mouth every morning.     cloNIDine (CATAPRES) 0.1 MG tablet  Take 0.1 mg by mouth 2 (two) times daily as needed (high BP).     clopidogrel  (PLAVIX ) 75 MG tablet Take 1 tablet (75 mg total) by mouth daily. 30 tablet 0   Continuous Glucose Sensor (FREESTYLE LIBRE 3 PLUS SENSOR) MISC Inject 1 Application into the skin once a week.     doxycycline  (VIBRA -TABS) 100 MG tablet Take 1 tablet (100 mg total) by mouth 2 (two) times daily. 30 tablet 0   estradiol (ESTRACE) 0.1 MG/GM vaginal cream Place 1 Applicatorful vaginally at bedtime as needed (vaginal irritation).     fluorouracil  (EFUDEX ) 5 % cream Apply 1 application. topically daily as needed (skin cancer breakouts).     gabapentin  (NEURONTIN ) 300 MG capsule Take 2 capsules (600 mg total) by mouth 3 (three) times daily. 180 capsule 1   GEMTESA 75 MG TABS Take 1 tablet by mouth daily.     Glucagon (GVOKE HYPOPEN 2-PACK) 1 MG/0.2ML SOAJ Inject 1 mg into the skin as needed (hypoglycemia).     insulin  aspart (NOVOLOG ) 100 UNIT/ML FlexPen Inject 4 Units into the skin 3 (three) times daily with meals. Per sliding scale: Inject 4 units subcutaneously three times daily after meals - plus sliding scale adjustment - add 1 unit for every 50 units over 200     Insulin  Degludec (TRESIBA  FLEXTOUCH) 100 UNIT/ML SOPN Inject 7 Units into the skin  daily. (Patient taking differently: Inject 15 Units into the skin daily after breakfast.)     latanoprost  (XALATAN ) 0.005 % ophthalmic solution Place 1 drop into both eyes at bedtime.     levothyroxine  (SYNTHROID ) 50 MCG tablet Take 50 mcg by mouth daily before breakfast.     MAGNESIUM -OXIDE 400 (240 Mg) MG tablet Take 1 tablet by mouth daily.     methazolamide  (NEPTAZANE ) 50 MG tablet Take 50 mg by mouth 2 (two) times daily.     methenamine  (HIPREX ) 1 g tablet Take 1 g by mouth 2 (two) times daily.     Multiple Vitamin (MULTIVITAMIN WITH MINERALS) TABS tablet Take 1 tablet by mouth every morning.     nitroGLYCERIN (NITRODUR - DOSED IN MG/24 HR) 0.2 mg/hr patch Place 1 patch (0.2 mg  total) onto the skin daily. 30 patch 1   nystatin  (MYCOSTATIN ) 100000 UNIT/ML suspension Take 5 mLs by mouth 4 (four) times daily as needed (throat pain).     oxyCODONE -acetaminophen  (PERCOCET/ROXICET) 5-325 MG tablet Take 1 tablet by mouth every 8 (eight) hours as needed. 30 tablet 0   Pancrelipase , Lip-Prot-Amyl, 24000-76000 units CPEP Take 2 capsules by mouth in the morning, at noon, and at bedtime.     pantoprazole  (PROTONIX ) 40 MG tablet Take 40 mg by mouth 2 (two) times daily.     sertraline  (ZOLOFT ) 100 MG tablet Take 100 mg by mouth every morning.     sodium chloride  1 g tablet Take 1 g by mouth daily.     sulfamethoxazole-trimethoprim (BACTRIM DS) 800-160 MG tablet Take 1 tablet by mouth 2 (two) times daily. 20 tablet 0   tacrolimus (PROTOPIC) 0.1 % ointment Apply 1 Application topically 2 (two) times daily.     traZODone  (DESYREL ) 100 MG tablet Take 100 mg by mouth at bedtime.     triamcinolone cream (KENALOG) 0.1 % Apply 1 Application topically 2 (two) times daily.     valsartan (DIOVAN) 320 MG tablet Take 320 mg by mouth daily.     Vitamin D, Ergocalciferol, (DRISDOL) 1.25 MG (50000 UNIT) CAPS capsule Take 50,000 Units by mouth every Sunday.     No current facility-administered medications for this visit.    REVIEW OF SYSTEMS (negative unless checked):   Cardiac:  []  Chest pain or chest pressure? []  Shortness of breath upon activity? []  Shortness of breath when lying flat? []  Irregular heart rhythm?  Vascular:  []  Pain in calf, thigh, or hip brought on by walking? []  Pain in feet at night that wakes you up from your sleep? []  Blood clot in your veins? []  Leg swelling?  Pulmonary:  []  Oxygen at home? []  Productive cough? []  Wheezing?  Neurologic:  []  Sudden weakness in arms or legs? []  Sudden numbness in arms or legs? []  Sudden onset of difficult speaking or slurred speech? []  Temporary loss of vision in one eye? []  Problems with dizziness?  Gastrointestinal:   []  Blood in stool? []  Vomited blood?  Genitourinary:  []  Burning when urinating? []  Blood in urine?  Psychiatric:  []  Major depression  Hematologic:  []  Bleeding problems? []  Problems with blood clotting?  Dermatologic:  []  Rashes or ulcers?  Constitutional:  []  Fever or chills?  Ear/Nose/Throat:  []  Change in hearing? []  Nose bleeds? []  Sore throat?  Musculoskeletal:  []  Back pain? []  Joint pain? []  Muscle pain?   Physical Examination   Vitals:   04/28/24 0943  BP: (!) 162/68  Pulse: 69  Temp: 97.7 F (36.5 C)  TempSrc: Temporal  Weight: 155 lb (70.3 kg)   Body mass index is 23.57 kg/m.  General:  WDWN in NAD; vital signs documented above Gait: Not observed HENT: WNL, normocephalic Pulmonary: normal non-labored breathing , without rales, rhonchi,  wheezing Cardiac: regular Abdomen: soft, NT, no masses Skin: without rashes Vascular Exam/Pulses: Palpable right femoral pulse.  Nonpalpable right popliteal or pedal pulses.  Brisk monophasic right DP and peroneal Doppler signals Extremities: Dehiscence of right third toe amputation site with slight surrounding erythema Musculoskeletal: no muscle wasting or atrophy  Neurologic: A&O X 3;  No focal weakness or paresthesias are detected Psychiatric:  The pt has Normal affect.    Non-Invasive Vascular imaging   ABI (04/28/2024) R:  ABI: 0.95 (0.51),  PT: mono DP: mono TBI: 0.4 L:  ABI: 1.13 (0.93),  PT: tri DP: tri TBI: 0.83  Aortoiliac Duplex (04/28/2024) +-------------------+--------+--------------+--------+--------+  Common Iliac ArteryPSV cm/sStenosis      WaveformComments  +-------------------+--------+--------------+--------+--------+  Prox to Stent      125                   biphasic          +-------------------+--------+--------------+--------+--------+  Proximal Stent     165                   biphasic           +-------------------+--------+--------------+--------+--------+  Mid Stent          175                   biphasic          +-------------------+--------+--------------+--------+--------+  Distal Stent       185                   biphasic          +-------------------+--------+--------------+--------+--------+  Distal to Stent    238     1-49% stenosisbiphasic          +-------------------+--------+--------------+--------+--------+   RLE Arterial Duplex (04/28/2024) +----------+--------+-----+---------------+----------+--------------+  RIGHT    PSV cm/sRatioStenosis       Waveform  Comments        +----------+--------+-----+---------------+----------+--------------+  CFA Prox  190          30-49% stenosisbiphasic  high end range  +----------+--------+-----+---------------+----------+--------------+  DFA      116                         biphasic                  +----------+--------+-----+---------------+----------+--------------+  SFA Prox  253     1.6  30-49% stenosismonophasichigh end range  +----------+--------+-----+---------------+----------+--------------+  SFA Mid   138                         monophasic                +----------+--------+-----+---------------+----------+--------------+  POP Prox  249          30-49% stenosismonophasichigh end range  +----------+--------+-----+---------------+----------+--------------+  POP Mid   115                         biphasic                  +----------+--------+-----+---------------+----------+--------------+  POP [id number]          50-74% stenosismonophasic                +----------+--------+-----+---------------+----------+--------------+  TP Trunk  179          30-49% stenosis                          +----------+--------+-----+---------------+----------+--------------+    Patent right SFA stent without stenosis  Carotid duplex (04/28/2024) 1 to  39% right ICA stenosis.  Patent left carotid endarterectomy site without restenosis  Medical Decision Making   Michelle Nicholson is a 63 y.o. female who presents for post angio follow-up  Based on the patient's studies, her ABIs on the right appear improved from 0.51 to 0.95.  Her toe pressure on the right has increased from 51 to 59 mmHg.  Aortoiliac duplex demonstrates a patent common iliac artery stent without stenosis.  There appears to be mild stenosis in the iliac artery just past the stent.  This does not appear to be flow-limiting. Right lower extremity arterial duplex demonstrates a patent right SFA stent without stenosis.  She does have diffuse atherosclerosis throughout the right lower extremity.  Most of her disease does not appear flow-limiting, except for some disease in the distal popliteal artery.  There is 50 to 74% stenosis within this artery, with a PSV of 384 cm/s. Carotid duplex demonstrates 1 to 39% right ICA stenosis.  She has a patent left carotid endarterectomy site without restenosis On exam she has a palpable right femoral pulse.  She has nonpalpable right popliteal and pedal pulses.  She does have brisk right DP and peroneal monophasic Doppler signals.  Her right third toe amputation site has dehisced recently, likely due to insufficient blood flow.  There does appear to be possible cellulitis of the skin surrounding her amputation.  She was just placed on antibiotics.  I have reviewed the patient's studies.  I discussed with the patient that her blood flow in the right lower extremity does appear improved after her recent intervention, however it remains marginal in terms of wound healing.  She could continue with local wound care with hopes of healing her amputation site.  I have also offered repeat angiography for the patient, given that there appears to be some popliteal stenosis amenable for intervention.  I have explained to the patient that ultimately even with repeat  intervention, there is a moderate chance that she still does not heal her amputation site.  She understands that she is at risk for more proximal amputation.  She would like to try another angiogram again in efforts to heal her amputation site. She will continue local wound care to the right foot.  She will continue her aspirin , Plavix , and statin.  We can schedule the patient for abdominal aortogram with right lower extremity runoff and possible popliteal and/or tibial intervention on Monday with Dr. Sheree.   Ahmed Holster PA-C [facility] Office: [phone]  Clinic MD: Lanis

## 2024-04-29 ENCOUNTER — Encounter: Payer: Self-pay | Admitting: Family

## 2024-05-02 ENCOUNTER — Encounter (HOSPITAL_COMMUNITY): Payer: Self-pay | Admitting: Vascular Surgery

## 2024-05-02 ENCOUNTER — Ambulatory Visit (HOSPITAL_COMMUNITY)
Admission: RE | Admit: 2024-05-02 | Discharge: 2024-05-02 | Disposition: A | Discharge status: Home / Self Care | Attending: Vascular Surgery | Admitting: Vascular Surgery

## 2024-05-02 ENCOUNTER — Other Ambulatory Visit: Payer: Self-pay

## 2024-05-02 ENCOUNTER — Encounter (HOSPITAL_COMMUNITY)
Admission: RE | Disposition: A | Discharge status: Home / Self Care | Payer: Self-pay | Source: Home / Self Care | Attending: Vascular Surgery

## 2024-05-02 DIAGNOSIS — Z95820 Peripheral vascular angioplasty status with implants and grafts: Secondary | ICD-10-CM

## 2024-05-02 DIAGNOSIS — T8789 Other complications of amputation stump: Secondary | ICD-10-CM | POA: Diagnosis present

## 2024-05-02 DIAGNOSIS — Z79899 Other long term (current) drug therapy: Secondary | ICD-10-CM | POA: Diagnosis not present

## 2024-05-02 DIAGNOSIS — Z95828 Presence of other vascular implants and grafts: Secondary | ICD-10-CM | POA: Diagnosis not present

## 2024-05-02 DIAGNOSIS — Y835 Amputation of limb(s) as the cause of abnormal reaction of the patient, or of later complication, without mention of misadventure at the time of the procedure: Secondary | ICD-10-CM | POA: Diagnosis not present

## 2024-05-02 DIAGNOSIS — Z7902 Long term (current) use of antithrombotics/antiplatelets: Secondary | ICD-10-CM | POA: Diagnosis not present

## 2024-05-02 DIAGNOSIS — Z7982 Long term (current) use of aspirin: Secondary | ICD-10-CM | POA: Insufficient documentation

## 2024-05-02 DIAGNOSIS — Z634 Disappearance and death of family member: Secondary | ICD-10-CM | POA: Diagnosis not present

## 2024-05-02 DIAGNOSIS — Z89421 Acquired absence of other right toe(s): Secondary | ICD-10-CM | POA: Insufficient documentation

## 2024-05-02 HISTORY — PX: LOWER EXTREMITY ANGIOGRAPHY: CATH118251

## 2024-05-02 HISTORY — PX: ABDOMINAL AORTOGRAM: CATH118222

## 2024-05-02 LAB — POCT I-STAT, CHEM 8
BUN: 30 mg/dL — ABNORMAL HIGH (ref 8–23)
Calcium, Ion: 1.21 mmol/L (ref 1.15–1.40)
Chloride: 107 mmol/L (ref 98–111)
Creatinine, Ser: 1.9 mg/dL — ABNORMAL HIGH (ref 0.44–1.00)
Glucose, Bld: 161 mg/dL — ABNORMAL HIGH (ref 70–99)
HCT: 29 % — ABNORMAL LOW (ref 36.0–46.0)
Hemoglobin: 9.9 g/dL — ABNORMAL LOW (ref 12.0–15.0)
Potassium: 4.5 mmol/L (ref 3.5–5.1)
Sodium: 135 mmol/L (ref 135–145)
TCO2: 18 mmol/L — ABNORMAL LOW (ref 22–32)

## 2024-05-02 LAB — GLUCOSE, CAPILLARY: Glucose-Capillary: 102 mg/dL — ABNORMAL HIGH (ref 70–99)

## 2024-05-02 SURGERY — ABDOMINAL AORTOGRAM
Anesthesia: LOCAL

## 2024-05-02 MED ORDER — LIDOCAINE HCL (PF) 1 % IJ SOLN
INTRAMUSCULAR | Status: DC | PRN
  Administered 2024-05-02: 10 mL via INTRADERMAL

## 2024-05-02 MED ORDER — MIDAZOLAM HCL (PF) 2 MG/2ML IJ SOLN
INTRAMUSCULAR | Status: DC | PRN
  Administered 2024-05-02: 1 mg via INTRAVENOUS

## 2024-05-02 MED ORDER — FENTANYL CITRATE (PF) 100 MCG/2ML IJ SOLN
INTRAMUSCULAR | Status: DC | PRN
  Administered 2024-05-02: 50 ug via INTRAVENOUS

## 2024-05-02 MED ORDER — SODIUM CHLORIDE 0.9 % IV SOLN: 250.0000 mL | INTRAVENOUS | Status: DC | PRN

## 2024-05-02 MED ORDER — SODIUM CHLORIDE 0.9% FLUSH: 3.0000 mL | INTRAVENOUS | Status: DC | PRN

## 2024-05-02 MED ORDER — ONDANSETRON HCL 4 MG/2ML IJ SOLN: 4.0000 mg | Freq: Four times a day (QID) | INTRAMUSCULAR | Status: DC | PRN

## 2024-05-02 MED ORDER — SODIUM CHLORIDE 0.9 % WEIGHT BASED INFUSION: 1.0000 mL/kg/h | INTRAVENOUS | Status: DC

## 2024-05-02 MED ORDER — ACETAMINOPHEN 325 MG PO TABS: 650.0000 mg | ORAL_TABLET | ORAL | Status: DC | PRN

## 2024-05-02 MED ORDER — SODIUM CHLORIDE 0.9% FLUSH: 3.0000 mL | Freq: Two times a day (BID) | INTRAVENOUS | Status: DC

## 2024-05-02 MED ORDER — MIDAZOLAM HCL 2 MG/2ML IJ SOLN
INTRAMUSCULAR | Status: AC
  Filled 2024-05-02: qty 2

## 2024-05-02 MED ORDER — HYDRALAZINE HCL 20 MG/ML IJ SOLN: 5.0000 mg | INTRAMUSCULAR | Status: DC | PRN

## 2024-05-02 MED ORDER — HEPARIN (PORCINE) IN NACL 1000-0.9 UT/500ML-% IV SOLN
INTRAVENOUS | Status: DC | PRN
  Administered 2024-05-02: 1000 mL

## 2024-05-02 MED ORDER — IODIXANOL 320 MG/ML IV SOLN
INTRAVENOUS | Status: DC | PRN
  Administered 2024-05-02: 5 mL via INTRA_ARTERIAL

## 2024-05-02 MED ORDER — SODIUM CHLORIDE 0.9 % IV SOLN: INTRAVENOUS | Status: DC

## 2024-05-02 MED ORDER — LIDOCAINE HCL (PF) 1 % IJ SOLN
INTRAMUSCULAR | Status: AC
  Filled 2024-05-02: qty 30

## 2024-05-02 MED ORDER — FENTANYL CITRATE (PF) 100 MCG/2ML IJ SOLN
INTRAMUSCULAR | Status: AC
  Filled 2024-05-02: qty 2

## 2024-05-02 MED ORDER — OXYCODONE HCL 5 MG PO TABS: 5.0000 mg | ORAL_TABLET | ORAL | Status: DC | PRN

## 2024-05-02 SURGICAL SUPPLY — 8 items
CATH OMNI FLUSH 5F 65CM (CATHETERS) IMPLANT
CLOSURE MYNX CONTROL 5F (Vascular Products) IMPLANT
KIT ANGIASSIST CO2 SYSTEM (KITS) IMPLANT
KIT MICROPUNCTURE NIT STIFF (SHEATH) IMPLANT
SET ATX-X65L (MISCELLANEOUS) IMPLANT
SHEATH PINNACLE 5F 10CM (SHEATH) IMPLANT
TRAY PV CATH (CUSTOM PROCEDURE TRAY) ×2 IMPLANT
WIRE BENTSON .035X145CM (WIRE) IMPLANT

## 2024-05-02 NOTE — Interval H&P Note (Signed)
 History and Physical Interval Note:  05/02/2024 7:31 AM  Michelle Nicholson  has presented today for surgery, with the diagnosis of ichemia right lower with tissue loss.  The various methods of treatment have been discussed with the patient and family. After consideration of risks, benefits and other options for treatment, the patient has consented to  Procedure(s): ABDOMINAL AORTOGRAM (N/A) Lower Extremity Angiography (N/A) LOWER EXTREMITY INTERVENTION (N/A) as a surgical intervention.  The patient's history has been reviewed, patient examined, no change in status, stable for surgery.  I have reviewed the patient's chart and labs.  Questions were answered to the patient's satisfaction.     [place]enne [place]

## 2024-05-02 NOTE — Discharge Instructions (Signed)
 Femoral Site Care The following information offers guidance on how to care for yourself after your procedure. Your health care provider may also give you more specific instructions. If you have problems or questions, contact your health care provider. What can I expect after the procedure? After the procedure, it is common to have bruising and tenderness at the incision site. This usually fades within 1-2 weeks. Follow these instructions at home: Incision site care  Follow instructions from your health care provider about how to take care of your incision site. Make sure you: Wash your hands with soap and water for at least 20 seconds before and after you change your bandage (dressing). If soap and water are not available, use hand sanitizer. Remove your dressing in 24 hours. Leave stitches (sutures), skin glue, or adhesive strips in place. These skin closures may need to stay in place for 2 weeks or longer. If adhesive strip edges start to loosen and curl up, you may trim the loose edges. Do not remove adhesive strips completely unless your health care provider tells you to do that. Do not take baths, swim, or use a hot tub for at least 1 week. You may shower 24 hours after the procedure or as told by your health care provider. Gently wash the incision site with plain soap and water. Pat the area dry with a clean towel. Do not rub the site. This may cause bleeding. Do not apply powder or lotion to the site. Keep the site clean and dry. Check your femoral site every day for signs of infection. Check for: Redness, swelling, or pain. Fluid or blood. Warmth. Pus or a bad smell. Activity If you were given a sedative during the procedure, it can affect you for several hours. Do not drive or operate machinery until your health care provider says that it is safe. Rest as told by your health care provider. Avoid sitting for a long time without moving. Get up to take short walks every 1-2 hours. This  is important to improve blood flow and breathing. Ask for help if you feel weak or unsteady. Return to your normal activities as told by your health care provider. Ask your health care provider what activities are safe for you and when you can return to work. Avoid activities that take a lot of effort for the first 2-3 days after your procedure, or as long as directed. Do not lift anything that is heavier than 10 lb (4.5 kg), or the limit that you are told, until your health care provider says that it is safe. General instructions Take over-the-counter and prescription medicines only as told by your health care provider. If you will be going home right after the procedure, plan to have a responsible adult care for you for the time you are told. This is important. Keep all follow-up visits. This is important. Contact a health care provider if: You have a fever or chills. You have any of these signs of infection at your incision site: Redness, swelling, or pain. Fluid or blood. Warmth. Pus or a bad smell. Get help right away if: The incision area swells very fast. The incision area is bleeding, and the bleeding does not stop when you hold steady pressure on the area. Your leg or foot becomes pale, cool, tingly, or numb. These symptoms may represent a serious problem that is an emergency. Do not wait to see if the symptoms will go away. Get medical help right away. Call your local emergency  services (911 in the [place] Do not drive yourself to the hospital. Summary After the procedure, it is common to have bruising and tenderness that fade within 1-2 weeks. Check your femoral site every day for signs of infection. Do not lift anything that is heavier than 10 lb (4.5 kg), or the limit that you are told, until your health care provider says that it is safe. Get help right away if the incision area swells very fast, you have bleeding at the incision area that does not stop, or your leg or foot  becomes pale, cool, or numb. This information is not intended to replace advice given to you by your health care provider. Make sure you discuss any questions you have with your health care provider. Document Revised: 03/20/2021 Document Reviewed: 08/19/2020 Elsevier Patient Education  2024 [employer].

## 2024-05-02 NOTE — Progress Notes (Signed)
 Discharge instructions reviewed with family at bedside. Site clean, dry, intact, and soft at discharge. IV removed. VSS. No additional question endorsed at this time.

## 2024-05-02 NOTE — Op Note (Signed)
    Patient name: Michelle Nicholson MRN: [id number] DOB: 1961-06-24 Sex: female  05/02/2024 Pre-operative Diagnosis: Nonhealing right toe amputation Post-operative diagnosis:  Same Surgeon:  Penne C. Sheree, MD Procedure Performed: 1.  Percutaneous ultrasound-guided cannulation and Mynx device closure left common femoral artery 2.  Catheter selection of aorta with aortogram and bilateral lower extremity angiography 3.  Catheter selection right common femoral artery and right lower extremity angiogram with CO2 and contrast 4.  Moderate sedation with fentanyl  and Versed  for 19 minutes  Indications: 63 year old female with history of right lower extremity revascularization with stenting of the right common iliac artery and right SFA and status post toe amputation which has been slow to heal.  She is now indicated for repeat angiography.  Findings: Aorta and iliac segments are calcified however there is no flow-limiting stenosis in the right common iliac artery stent is patent.  The bilateral common femoral arteries are patent without flow-limiting stenosis.  The right SFA is patent there are 2 areas of calcification which are nonflow limiting and the stent remains patent.  Popliteal artery remains patent dominant runoff via the anterior tibial artery.  She has a palpable dorsalis pedis at the foot.  No intervention was undertaken.  Patient is optimized from vascular standpoint for wound healing.   Procedure:  The patient was identified in the holding area and taken to room 8.  The patient was then placed supine on the table and prepped and draped in the usual sterile fashion.  A time out was called.  Ultrasound was used to evaluate the left common femoral artery.  There was some calcification distally.  More proximally I was able to anesthetize around the artery and cannulated with micropuncture needle followed by wire and sheath using direct ultrasound visualization, and ultrasound images saved to the  permanent record.  We placed a Bentson wire followed by 5 French sheath and Omni catheter was placed to the level of L1 and CO2 aortogram was performed.  The catheter was then pulled to the aortic bifurcation and pelvic angiography with bilateral common femoral artery evaluation was performed.  We then crossed the bifurcation with Omni catheter and Bentson wire performed right lower extremity angiography with CO2 followed by contrasted angiography of the mid and distal thigh.  I elected for no intervention.  The catheter was removed over a wire.  Mynx device was deployed.  She tolerated the procedure well or may complication.    Contrast: 5 cc    Tiasha Helvie C. Sheree, MD [facility] Office: [phone] Pager: [phone]

## 2024-05-05 ENCOUNTER — Ambulatory Visit (INDEPENDENT_AMBULATORY_CARE_PROVIDER_SITE_OTHER): Admitting: Orthopedic Surgery

## 2024-05-05 DIAGNOSIS — T8781 Dehiscence of amputation stump: Secondary | ICD-10-CM

## 2024-05-06 ENCOUNTER — Encounter: Payer: Self-pay | Admitting: Orthopedic Surgery

## 2024-05-06 NOTE — Progress Notes (Signed)
 Office Visit Note   Patient: Ronal Grippe           Date of Birth: July 30, 1960           MRN: [id number] Visit Date: 05/05/2024              Requested by: Darilyn Rosalva Bruckner, PA-C [id number] [place],  [place] [address] PCP: Podraza, Cole Christopher, PA-C  Chief Complaint  Patient presents with   Right Foot - Routine Post Op    03/25/2024 right 3rd toe amputation      HPI: Discussed the use of AI scribe software for clinical note transcription with the patient, who gave verbal consent to proceed.  History of Present Illness Corlis Angelica is a 63 year old female who presents for wound care management of ischemic changes along a surgical incision.  She is managing a surgical wound following a ray amputation. She applies silver nitrate on the incision at night and uses a nitroglycerin patch to improve circulation. Vashe is used for wound cleaning. Despite these measures, she notes poor circulation in the lower foot, confirmed by a recent ABI test. A scheduled procedure to open veins was deemed unnecessary as the larger blood vessels were found to be open.  She is taking doxycycline  twice daily for an infection and has one day of antibiotics remaining.  She has limited physical activity to improve circulation to the foot. She has not been driving and keeps her foot elevated above heart level to aid blood flow. She mentions recent emotional stress due to the passing of her brother-in-law, for whom she was the guardian and power of attorney, which has been physically and emotionally taxing.  She reports a sore area with a knot under the skin near a previous surgical site, which is tender when she coughs or moves. This area has not caused issues in the past, and she is concerned about the new discomfort.     Assessment & Plan: Visit Diagnoses: No diagnosis found.  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 Caused by poor microscopic circulation. Doxycycline  improved condition but risks gut flora disruption. - Complete current course of doxycycline . - Switch to Vashe dressing changes. - Continue nitroglycerin patch daily. - Advise foot elevation at heart level. - Advise against driving and prolonged standing. - Instruct to monitor for infection signs and return if present.  Small hematoma at endovascular incision site Small hematoma due to minor bleeding, no aneurysm, soreness present but no major complications.      Follow-Up Instructions: No follow-ups on file.   Ortho Exam  Patient is alert, oriented, no adenopathy, well-dressed, normal affect, normal respiratory effort. Physical Exam CARDIOVASCULAR: Palpable dorsalis pedis pulse. SKIN: Ischemic changes along surgical incision from ray amputation. Wound gaped open 2mm with dry fibrinous tissue. No cellulitis, odor, drainage, or signs of infection. Small hematoma at incision from endovascular evaluation, no aneurysm.      Imaging: No results found. No images are attached to the encounter.  Labs: Lab Results  Component Value Date   HGBA1C 8.5 (H) 03/19/2024   HGBA1C 7.8 (H) 11/28/2021   HGBA1C 9.0 (H) 04/24/2021   ESRSEDRATE 59 (H) 03/19/2024   CRP 1.0 (H) 03/19/2024   REPTSTATUS 03/24/2024 FINAL 03/19/2024   GRAMSTAIN  03/05/2023    FEW WBC PRESENT,BOTH PMN AND MONONUCLEAR NO ORGANISMS SEEN    GRAMSTAIN  03/05/2023    FEW WBC PRESENT, PREDOMINANTLY PMN NO ORGANISMS SEEN    CULT  03/19/2024    NO GROWTH 5 DAYS Performed at [facility], [address], [place], [place] [address]      Lab Results  Component Value Date   ALBUMIN  3.0 (L) 03/22/2024   ALBUMIN  3.2 (L) 03/18/2024   ALBUMIN  3.8 01/23/2024    Lab Results  Component Value Date   MG 1.7 03/25/2024   MG 1.7 03/24/2024   MG 2.1 04/21/2021   MG 2.2 04/21/2021    No results found for: VD25OH  No results found for: PREALBUMIN    Latest Ref Rng & Units 05/02/2024    7:35 AM 03/27/2024    2:20 AM 03/26/2024    3:04 AM  CBC EXTENDED  WBC 4.0 - 10.5 K/uL   7.6   RBC 3.87 - 5.11 MIL/uL   2.84   Hemoglobin 12.0 - 15.0 g/dL 9.9  9.0  8.5   HCT 63.9 - 46.0 % 29.0  27.9  26.1   Platelets 150 - 400 K/uL   275      There is no height or weight on file to calculate BMI.  Orders:  No orders of the defined types were placed in this encounter.  No orders of the defined types were placed in this encounter.    Procedures: No procedures performed  Clinical Data: No additional findings.  ROS:  All other systems negative, except as noted in the HPI. Review of Systems  Objective: Vital Signs: There were no vitals taken for this visit.  Specialty Comments:  No specialty comments available.  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) 03/19/2024   Arthritis of right hip, degenerative labral tear 03/07/2023   Labral tear of right hip joint 03/07/2023   Right hip pain 03/04/2023   Pain in right hip 03/04/2023   Carotid artery stenosis 12/02/2021   Pyuria 11/29/2021   Preop cardiovascular exam    Acute ischemic stroke (HCC) 11/28/2021   Recurrent UTI 11/28/2021   Internal carotid artery stenosis, left 11/28/2021   Hypokalemia 11/28/2021   Dyslipidemia 11/28/2021   Tobacco abuse 11/28/2021   Fatigue 11/28/2021   Severe sepsis (HCC) 04/21/2021   Shortness of breath 04/21/2021   Acute respiratory failure with hypoxia (HCC) 04/21/2021   Chronic hyponatremia 04/21/2021   AKI (acute kidney injury) 04/21/2021   Pneumonia 04/21/2021   Bilateral pneumonia 04/20/2021   Uncontrolled  diabetes mellitus with hyperglycemia, with long-term current use of insulin  (HCC) 12/09/2015   Essential hypertension 12/09/2015   Meniere syndrome 12/09/2015   Past Medical History:  Diagnosis Date   Anemia    Arthritis    Arthritis of right hip 03/07/2023   Cancer (HCC)    skin ca, squamous, basal   COPD (chronic obstructive pulmonary disease) (HCC)    Diabetes mellitus    Dyspnea    Family history of adverse reaction to anesthesia    `mother - N/V, Brother N/V   Hypertension    Hypothyroidism    Labral tear of right hip joint 03/07/2023   Neuropathy    Pancreas disorder    Peripheral vascular disease    Pneumonia    04/2021, 2003   Stroke (  HCC) 2017   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, equilibrium    Family History  Problem Relation Age of Onset   Cancer Mother    Hypertension Father    Diabetes Father    Cancer Sister    Diabetes Sister    Diabetes Brother    Kidney failure Brother    CAD Brother    Diabetes Other     Past Surgical History:  Procedure Laterality Date   ABDOMINAL AORTOGRAM N/A 03/23/2024   Procedure: ABDOMINAL AORTOGRAM;  Surgeon: Lanis Fonda BRAVO, MD;  Location: [facility] INVASIVE CV LAB;  Service: Cardiovascular;  Laterality: N/A;   ABDOMINAL AORTOGRAM N/A 05/02/2024   Procedure: ABDOMINAL AORTOGRAM;  Surgeon: Sheree Penne Bruckner, MD;  Location: [facility] INVASIVE CV LAB;  Service: Cardiovascular;  Laterality: N/A;   AMPUTATION TOE Right 03/25/2024   Procedure: AMPUTATION, TOE;  Surgeon: Harden Jerona GAILS, MD;  Location: [facility] OR;  Service: Orthopedics;  Laterality: Right;  RIGHT 3RD TOE AMPUTATION   CARDIAC CATHETERIZATION     ENDARTERECTOMY Left 12/02/2021   Procedure: LEFT CAROTID ENDARTERECTOMY;  Surgeon: Lanis Fonda BRAVO, MD;  Location: [facility] OR;  Service: Vascular;  Laterality: Left;   LOWER EXTREMITY ANGIOGRAPHY N/A 03/23/2024   Procedure: Lower Extremity Angiography;  Surgeon: Lanis Fonda BRAVO, MD;  Location: [facility] INVASIVE CV LAB;  Service: Cardiovascular;   Laterality: N/A;   LOWER EXTREMITY ANGIOGRAPHY N/A 05/02/2024   Procedure: Lower Extremity Angiography;  Surgeon: Sheree Penne Bruckner, MD;  Location: [facility] INVASIVE CV LAB;  Service: Cardiovascular;  Laterality: N/A;   LOWER EXTREMITY INTERVENTION  03/23/2024   Procedure: LOWER EXTREMITY INTERVENTION;  Surgeon: Lanis Fonda BRAVO, MD;  Location: [facility] INVASIVE CV LAB;  Service: Cardiovascular;;   PATCH ANGIOPLASTY Left 12/02/2021   Procedure: PATCH ANGIOPLASTY USING [employer]GE [employer] 1cm x 6cm PATCH;  Surgeon: Lanis Fonda BRAVO, MD;  Location: [facility] OR;  Service: Vascular;  Laterality: Left;   Social History   Occupational History   Not on file  Tobacco Use   Smoking status: Former    Current packs/day: 0.00    Types: Cigarettes    Quit date: 11/27/2021    Years since quitting: 2.4   Smokeless tobacco: Never  Vaping Use   Vaping status: Never Used  Substance and Sexual Activity   Alcohol use: No    Comment: 2 drinks a month   Drug use: No   Sexual activity: Not on file

## 2024-05-12 ENCOUNTER — Other Ambulatory Visit: Payer: Self-pay | Admitting: Vascular Surgery

## 2024-05-12 DIAGNOSIS — I6522 Occlusion and stenosis of left carotid artery: Secondary | ICD-10-CM

## 2024-05-16 ENCOUNTER — Encounter: Payer: Self-pay | Admitting: Radiology

## 2024-05-19 ENCOUNTER — Ambulatory Visit (INDEPENDENT_AMBULATORY_CARE_PROVIDER_SITE_OTHER): Admitting: Physician Assistant

## 2024-05-19 DIAGNOSIS — I739 Peripheral vascular disease, unspecified: Secondary | ICD-10-CM

## 2024-05-19 DIAGNOSIS — T8781 Dehiscence of amputation stump: Secondary | ICD-10-CM

## 2024-05-19 NOTE — Progress Notes (Unsigned)
 Office Visit Note   Patient: Michelle Nicholson           Date of Birth: 1960/08/07           MRN: [id number] Visit Date: 05/19/2024              Requested by: Darilyn Rosalva Bruckner, PA-C [address] [place],  [place] [address] PCP: Podraza, Cole Christopher, PA-C  Chief Complaint  Patient presents with  . Right Foot - Routine Post Op    03/25/2024 right 3rd toe amputation      HPI: ***  Assessment & Plan: Visit Diagnoses: No diagnosis found.  Plan: ***  Follow-Up Instructions: No follow-ups on file.   Ortho Exam  Patient is alert, oriented, no adenopathy, well-dressed, normal affect, normal respiratory effort. ***    Imaging: No results found. No images are attached to the encounter.  Labs: Lab Results  Component Value Date   HGBA1C 8.5 (H) 03/19/2024   HGBA1C 7.8 (H) 11/28/2021   HGBA1C 9.0 (H) 04/24/2021   ESRSEDRATE 59 (H) 03/19/2024   CRP 1.0 (H) 03/19/2024   REPTSTATUS 03/24/2024 FINAL 03/19/2024   GRAMSTAIN  03/05/2023    FEW WBC PRESENT,BOTH PMN AND MONONUCLEAR NO ORGANISMS SEEN    GRAMSTAIN  03/05/2023    FEW WBC PRESENT, PREDOMINANTLY PMN NO ORGANISMS SEEN    CULT  03/19/2024    NO GROWTH 5 DAYS Performed at [facility], [address], [place], [place] [address]      Lab Results  Component Value Date   ALBUMIN  3.0 (L) 03/22/2024   ALBUMIN  3.2 (L) 03/18/2024   ALBUMIN  3.8 01/23/2024    Lab Results  Component Value Date   MG 1.7 03/25/2024   MG 1.7 03/24/2024   MG 2.1 04/21/2021   MG 2.2 04/21/2021   No results found for: VD25OH  No results found for: PREALBUMIN    Latest Ref Rng & Units 05/02/2024    7:35 AM 03/27/2024    2:20 AM 03/26/2024    3:04 AM  CBC EXTENDED  WBC 4.0 - 10.5 K/uL   7.6   RBC 3.87 - 5.11 MIL/uL   2.84   Hemoglobin 12.0 - 15.0 g/dL 9.9  9.0  8.5   HCT 63.9 - 46.0 % 29.0  27.9  26.1   Platelets 150 - 400 K/uL   275      There is no height or weight on file to calculate  BMI.  Orders:  No orders of the defined types were placed in this encounter.  No orders of the defined types were placed in this encounter.    Procedures: No procedures performed  Clinical Data: No additional findings.  ROS:  All other systems negative, except as noted in the HPI. Review of Systems  Objective: Vital Signs: There were no vitals taken for this visit.  Specialty Comments:  No specialty comments available.  PMFS History: Patient Active Problem List   Diagnosis Date Noted  . PAD (peripheral artery disease) 03/20/2024  . Gangrene of toe of right foot (HCC) 03/20/2024  . COPD (chronic obstructive pulmonary disease) (HCC) 03/20/2024  . Hypothyroidism 03/20/2024  . Osteomyelitis of third toe of right foot (HCC) 03/19/2024  . Arthritis of right hip, degenerative labral tear 03/07/2023  . Labral tear of right hip joint 03/07/2023  . Right hip pain 03/04/2023  . Pain in right hip 03/04/2023  . Carotid artery stenosis 12/02/2021  . Pyuria 11/29/2021  . Preop cardiovascular exam   .  Acute ischemic stroke (HCC) 11/28/2021  . Recurrent UTI 11/28/2021  . Internal carotid artery stenosis, left 11/28/2021  . Hypokalemia 11/28/2021  . Dyslipidemia 11/28/2021  . Tobacco abuse 11/28/2021  . Fatigue 11/28/2021  . Severe sepsis (HCC) 04/21/2021  . Shortness of breath 04/21/2021  . Acute respiratory failure with hypoxia (HCC) 04/21/2021  . Chronic hyponatremia 04/21/2021  . AKI (acute kidney injury) 04/21/2021  . Pneumonia 04/21/2021  . Bilateral pneumonia 04/20/2021  . Uncontrolled diabetes mellitus with hyperglycemia, with long-term current use of insulin  (HCC) 12/09/2015  . Essential hypertension 12/09/2015  . Meniere syndrome 12/09/2015   Past Medical History:  Diagnosis Date  . Anemia   . Arthritis   . Arthritis of right hip 03/07/2023  . Cancer (HCC)    skin ca, squamous, basal  . COPD (chronic obstructive pulmonary disease) (HCC)   . Diabetes mellitus    . Dyspnea   . Family history of adverse reaction to anesthesia    `mother - N/V, Brother N/V  . Hypertension   . Hypothyroidism   . Labral tear of right hip joint 03/07/2023  . Neuropathy   . Pancreas disorder   . Peripheral vascular disease   . Pneumonia    04/2021, 2003  . 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 Cancer Mother   . Hypertension Father   . Diabetes Father   . Cancer Sister   . Diabetes Sister   . Diabetes Brother   . Kidney failure Brother   . CAD Brother   . Diabetes Other     Past Surgical History:  Procedure Laterality Date  . ABDOMINAL AORTOGRAM N/A 03/23/2024   Procedure: ABDOMINAL AORTOGRAM;  Surgeon: Lanis Fonda BRAVO, MD;  Location: [facility] INVASIVE CV LAB;  Service: Cardiovascular;  Laterality: N/A;  . ABDOMINAL AORTOGRAM N/A 05/02/2024   Procedure: ABDOMINAL AORTOGRAM;  Surgeon: Sheree Penne Bruckner, MD;  Location[facility] INVASIVE CV LAB;  Service: Cardiovascular;  Laterality: N/A;  . AMPUTATION TOE Right 03/25/2024   Procedure: AMPUTATION, TOE;  Surgeon: Harden Jerona GAILS, MD;  Location: [facility] OR;  Service: Orthopedics;  Laterality: Right;  RIGHT 3RD TOE AMPUTATION  . CARDIAC CATHETERIZATION    . ENDARTERECTOMY Left 12/02/2021   Procedure: LEFT CAROTID ENDARTERECTOMY;  Surgeon: Lanis Fonda BRAVO, MD;  Location: [facility] OR;  Service: Vascular;  Laterality: Left;  . LOWER EXTREMITY ANGIOGRAPHY N/A 03/23/2024   Procedure: Lower Extremity Angiography;  Surgeon: Lanis Fonda BRAVO, MD;  Location: [facility] INVASIVE CV LAB;  Service: Cardiovascular;  Laterality: N/A;  . LOWER EXTREMITY ANGIOGRAPHY N/A 05/02/2024   Procedure: Lower Extremity Angiography;  Surgeon: Sheree Penne Bruckner, MD;  Location: [facility] INVASIVE CV LAB;  Service: Cardiovascular;  Laterality: N/A;  . LOWER EXTREMITY INTERVENTION  03/23/2024   Procedure: LOWER EXTREMITY INTERVENTION;  Surgeon: Lanis Fonda BRAVO, MD;  Location: [facility] INVASIVE CV LAB;   Service: Cardiovascular;;  . PATCH ANGIOPLASTY Left 12/02/2021   Procedure: PATCH ANGIOPLASTY USING [employer] 1cm x 6cm PATCH;  Surgeon: Lanis Fonda BRAVO, MD;  Location: [facility] OR;  Service: Vascular;  Laterality: Left;   Social History   Occupational History  . Not on file  Tobacco Use  . Smoking status: Former    Current packs/day: 0.00    Types: Cigarettes    Quit date: 11/27/2021    Years since quitting: 2.4  . Smokeless tobacco: Never  Vaping Use  . Vaping status: Never Used  Substance and Sexual Activity  . Alcohol use: No    Comment: 2 drinks a month  . Drug use: No  . Sexual activity: Not on file

## 2024-05-20 ENCOUNTER — Encounter: Payer: Self-pay | Admitting: Physician Assistant

## 2024-05-31 NOTE — Progress Notes (Signed)
 Subjective:- Michelle Nicholson is a 63 y.o.  female who was seen today for Podraza, Rosalva Palma* for t2d, insulin  dependent (low cpeptide, neg antibodies). A1c today is 8.9%, previously 8.4%.   Sadly, she underwent a 3rd right toe amputation since last visit (only been home x 2 weeks). She had a corn that eventually turned into an abscess/cellulitis. Due to PVD, this is likely was contributed to the amputation. She was seen by vascular at an outside facility.   She is wearing libre.    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  She tried jardiance after last visit but she had UTIs and hypoglycemia.   Hypothyroid: Has been on levothyroxine  50 mcg. Compliant.  Lab Results  Component Value Date   TSH 3.32 10/02/2022     Diabetes Health Care Maintenance: Eye exam current (within one year): yes with evidence of Retinopathy and glaucoma. 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 Taking sodium tabs prn. This is followed by her PCP   PCP Darilyn Rosalva Bruckner, PA PHONE: [phone] FAX: [phone]     Medical history: Past Medical History:  Diagnosis Date  . Acquired hypothyroidism 09/25/2016  . Cataract cortical, senile   . Chronic pancreatitis (CMS/HHS-HCC)   . Degenerative disc disease, lumbar   . Depression   . Diabetes mellitus type I (CMS/HHS-HCC)   . Diabetic retinopathy associated with type 2 diabetes  mellitus (CMS/HHS-HCC)   . Fecal incontinence   . Glaucoma (increased eye pressure)   . H/O TIA (transient ischemic attack) and stroke 11/27/2021   Patient stated she has 3 Mini TIA's.  . Hypertension   . Liver lesion    on imaging, followed by Dr. Tobias Cleaves  . Neuropathy   . PAD (peripheral artery disease)   . Stroke (CMS/HHS-HCC)   . Type 2 diabetes mellitus without complication, with long-term current use of insulin  (CMS/HHS-HCC) 05/29/2015  . Urinary incontinence   . Vision abnormalities     Past surgical history: Past Surgical History:  Procedure Laterality Date  . TUBAL LIGATION  2003  . Artery Clean out  12/02/2021   Left Carotid Artery  . LENS EYE SURGERY Right 08/12/2022   [facility] Dr Nilsa  . LENS EYE SURGERY Left 09/09/2022   [facility] -Dr. Nilsa  . AMPUTATION TOE Right 03/2024   Sept 2025 [place]  . UNLISTED PROCEDURE VASCULAR SURGERY  03/2024   Stint placed to get the blood flow to feet  . left wrist surgery    . TONSILLECTOMY    . VITREOUS RETINAL SURGERY Right    Avastin injection    Family History: Family History  Problem Relation Name Age of Onset  . Ovarian cancer Mother    . Colon cancer Sister    . Thyroid  cancer Sister    . Heart disease Sister    . Diabetes Father    .  Heart disease Father    . Diabetes Paternal Grandmother    . Diabetes Paternal Grandfather    . Diabetes Sister    . Diabetes Brother    . Heart disease Brother    . Diabetes Daughter    . No Known Problems Maternal Aunt    . No Known Problems Maternal Uncle    . No Known Problems Paternal Aunt    . No Known Problems Paternal Uncle    . No Known Problems Maternal Grandmother    . No Known Problems Maternal Grandfather    . Allergic rhinitis Neg Hx    . Amblyopia Neg Hx    . Ankylosing spondylitis Neg Hx    . Atrial fibrillation (Abnormal heart rhythm sometimes requiring treatment with blood thinners) Neg Hx    . Basal cell carcinoma Neg Hx    .  Blindness Neg Hx    . Cataracts Neg Hx    . Coronary Artery Disease (Blocked arteries around heart) Neg Hx    . Diabetes type I Neg Hx    . Diabetes type II Neg Hx    . Glaucoma Neg Hx    . Graves' disease Neg Hx    . Hashimoto's thyroiditis Neg Hx    . High blood pressure (Hypertension) Neg Hx    . Hyperthyroidism Neg Hx    . Hypothyroidism Neg Hx    . Macular degeneration Neg Hx    . Melanoma Neg Hx    . Neuropathy Neg Hx    . Renal Insufficiency Neg Hx    . Retinal degeneration Neg Hx    . Retinopathy of prematurity Neg Hx    . Rheum arthritis Neg Hx    . Sarcoidosis Neg Hx    . Sinusitis Neg Hx    . Sleep apnea Neg Hx    . Strabismus Neg Hx    . Thyroid  disease Neg Hx    . Vision loss Neg Hx      Medications:  Current Outpatient Medications:  .  acetaminophen  (TYLENOL ) 500 MG tablet, Take by mouth at bedtime as needed, Disp: , Rfl:  .  albuterol  90 mcg/actuation inhaler, Inhale into the lungs, Disp: , Rfl:  .  amLODIPine  (NORVASC ) 2.5 MG tablet, Take 3 tablets (7.5 mg total) by mouth at bedtime, Disp: , Rfl:  .  aspirin  325 MG tablet, 1 tab by mouth daily, Disp: , Rfl:  .  atorvastatin  (LIPITOR ) 80 MG tablet, Take 80 mg by mouth once daily, Disp: , Rfl:  .  blood glucose diagnostic (ONETOUCH ULTRA BLUE TEST STRIP) test strip, 3 (three) times daily Use as instructed., Disp: 400 each, Rfl: 4 .  blood-glucose sensor (FREESTYLE LIBRE 3 PLUS SENSOR) Devi, Use 1 each every 15 (fifteen) days, Disp: 6 each, Rfl: 3 .  buPROPion  (WELLBUTRIN  XL) 150 MG XL tablet, Take 150 mg by mouth once daily, Disp: , Rfl:  .  CALCIUM -MAGNESIUM -ZINC ORAL, Take 1 tablet by mouth every morning, Disp: , Rfl:  .  clopidogreL  (PLAVIX ) 75 mg tablet, Take 75 mg by mouth once daily, Disp: , Rfl:  .  co-enzyme Q-10, ubiquinone, 100 mg capsule, Take 100 mg by mouth once daily, Disp: , Rfl:  .  CREON  24,000-76,000 -120,000 unit DR capsule, Take 2 capsules by mouth once daily.  , Disp: , Rfl:  .   ergocalciferol, vitamin D2, 1,250 mcg (50,000 unit) capsule, Take 50,000 Units by mouth once a week, Disp: , Rfl:  .  estradioL (  ESTRACE) 0.01 % (0.1 mg/gram) vaginal cream, APPLY SMALL AMOUNT IN VAGINA WITH INDEX FINGER EVERY OTHER NIGHT, Disp: , Rfl:  .  FEROSUL 325 mg (65 mg iron) tablet, Take 325 mg by mouth daily with breakfast, Disp: , Rfl:  .  fluorouraciL  (EFUDEX ) 5 % cream, Apply 1 Application topically at bedtime as needed, Disp: , Rfl:  .  fluticasone  propion-salmeteroL (ADVAIR DISKUS) 100-50 mcg/dose diskus inhaler, Inhale into the lungs, Disp: , Rfl:  .  gabapentin  (NEURONTIN ) 300 MG capsule, Take 300 mg by mouth 2 (two) times daily, Disp: , Rfl:  .  GEMTESA 75 mg Tab, Take 1 tablet by mouth once daily, Disp: , Rfl:  .  glucagon (GVOKE HYPOPEN 2-PACK) 1 mg/0.2 mL auto-injector, Use as directed for severe low BG., Disp: 0.4 mL, Rfl: 12 .  lancets, , Disp: , Rfl:  .  latanoprost  (XALATAN ) 0.005 % ophthalmic solution, INSTILL 1 DROP INTO EACH EYE AT BEDTIME, Disp: 10 mL, Rfl: 3 .  levothyroxine  (SYNTHROID ) 50 MCG tablet, Take 1 tablet (50 mcg total) by mouth every morning before breakfast (0630) for 360 days ON AN EMPTY STOMACH WITH A GLASS OF WATER 30 TO 60 MINUTES BEFORE BREAKFAST, Disp: 90 tablet, Rfl: 3 .  magnesium  oxide (MAG-OX) 400 mg (241.3 mg magnesium ) tablet, Take 1 tablet by mouth once daily, Disp: , Rfl:  .  meclizine  (ANTIVERT ) 12.5 mg tablet, Take 12.5 mg by mouth once daily as needed.  , Disp: , Rfl:  .  methazolAMIDE  (NEPTAZANE ) 50 MG tablet, Take 1 tablet (50 mg total) by mouth 2 (two) times daily, Disp: 60 tablet, Rfl: 11 .  methenamine  hippurate (HIPREX ) 1 gram tablet, TAKE 1 TABLET BY MOUTH IN THE MORNING AND 1 IN THE EVENING WITH MEALS. MOST EFFECTIVE WHEN TAKEN WITH A DAILY VITAMIN C SUPPLEMENT., Disp: , Rfl:  .  multivitamin tablet, Take 1 tablet by mouth once daily.  , Disp: , Rfl:  .  nitroGLYcerin (NITRODUR) 0.2 mg/hr patch, Place 1 patch onto the skin once daily,  Disp: , Rfl:  .  NOVOLOG  FLEXPEN U-100 INSULIN  pen injector (concentration 100 units/mL), Inject up to 15 u TID AC plus SSI for up to 50 u TDD, Disp: 45 mL, Rfl: 3 .  nystatin -triamcinolone cream, , Disp: , Rfl:  .  ONETOUCH ULTRA2 METER Misc, 1 each by XX route as directed, Disp: 1 kit, Rfl: 1 .  peg 400-propylene glycol (SYSTANE ULTRA) 0.4-0.3 % drops, Apply to eye, Disp: , Rfl:  .  sertraline  (ZOLOFT ) 100 MG tablet, Take 100 mg by mouth once daily, Disp: , Rfl:  .  sodium chloride  1,000 mg soluble tablet, Take 1 g by mouth 3 (three) times daily as needed, Disp: , Rfl:  .  tacrolimus (PROTOPIC) 0.1 % ointment, APPLY OINTMENT EXTERNALLY TWICE DAILY TO RASH ON LEGS FOR 2 WEEKS ON, 2 WEEKS OFF, THEN REPEAT, Disp: , Rfl:  .  traZODone  (DESYREL ) 100 MG tablet, Take 100 mg by mouth at bedtime as needed for Sleep, Disp: , Rfl:  .  TRESIBA  FLEXTOUCH U-100 pen injector (concentration 100 units/mL), Inject 40 Units subcutaneously once daily, Disp: 15 mL, Rfl: 4 .  valsartan (DIOVAN) 160 MG tablet, Take 320 mg by mouth 2 (two) times daily, Disp: , Rfl:  .  amoxicillin  (AMOXIL ) 250 MG capsule, Take 250 mg by mouth 3 (three) times daily (Patient not taking: Reported on 09/28/2023), Disp: , Rfl:  .  amoxicillin -clavulanate (AUGMENTIN ) 875-125 mg tablet, Take 1 tablet by mouth 2 (  two) times daily (Patient not taking: Reported on 05/31/2024), Disp: , Rfl:  .  atorvastatin  (LIPITOR ) 20 MG tablet, Take 20 mg by mouth once daily (Patient not taking: Reported on 05/31/2024), Disp: , Rfl:  .  atorvastatin  (LIPITOR ) 40 MG tablet, Take 40 mg by mouth once daily APPOINTMENT REQUIRED FOR FUTURE REFILLS (Patient not taking: Reported on 05/31/2024), Disp: , Rfl:  .  cholecalciferol (VITAMIN D3) 2,000 unit capsule, Take 4,000 Units by mouth once daily (Patient not taking: Reported on 05/31/2024), Disp: , Rfl:  .  cilostazoL (PLETAL) 100 MG tablet, Take 100 mg by mouth 2 (two) times daily (Patient not taking: Reported on  11/30/2023), Disp: , Rfl:  .  cinnamon bark (CINNAMON ORAL), Take 500 mg by mouth 2 (two) times daily (Patient not taking: Reported on 11/30/2023), Disp: , Rfl:  .  cyanocobalamin  (VITAMIN B12) 1000 MCG tablet, Take 1,000 mcg by mouth once daily (Patient not taking: Reported on 05/31/2024), Disp: , Rfl:  .  d-mannose 500 mg Cap, Take 500 mg by mouth once daily (Patient not taking: Reported on 11/30/2023), Disp: , Rfl:  .  folic acid  (FOLVITE ) 1 MG tablet, Take 1,000 mcg by mouth once daily (Patient not taking: Reported on 11/30/2023), Disp: , Rfl:  .  fosfomycin (MONUROL) 3 gram packet, Take 3 g by mouth every 10 (ten) days (Patient not taking: Reported on 11/30/2023), Disp: , Rfl:  .  FREESTYLE LIBRE 2 READER reader, Use 1 Device as directed (Patient not taking: Reported on 05/31/2024), Disp: 1 each, Rfl: 1 .  losartan  (COZAAR ) 50 MG tablet, Take 50 mg by mouth once daily   (Patient not taking: Reported on 11/30/2023), Disp: , Rfl:  .  melatonin 5 mg Tab, Take by mouth at bedtime as needed (Patient not taking: Reported on 05/31/2024), Disp: , Rfl:  .  meloxicam (MOBIC) 7.5 MG tablet, , Disp: , Rfl: 2 .  moxifloxacin (VIGAMOX) 0.5 % ophthalmic solution, Place 1 drop into the left eye 3 (three) times daily (Patient not taking: Reported on 05/31/2024), Disp: , Rfl:  .  pen needle, diabetic (PEN NEEDLE) 32 gauge x 5/32 Ndle, Use with insulin  injection 4 times a day, Disp: 150 each, Rfl: 11  Allergies: Allergies  Allergen Reactions  . Brimonidine Other (See Comments)    Follicular conjunctivitis  . Dorzolamide-Timolol Other (See Comments)    Makes her eyes burn VA blurry  . Nsaids (Non-Steroidal Anti-Inflammatory Drug) Other (See Comments)    Renal insufficiency   . Rocklatan [Netarsudil-Latanoprost ] Other (See Comments)    Redness, tearing, burning     Physical Exam:  There were no vitals filed for this visit.   General:  Well appearing, no acute distress, appears stated age, cooperative    Psych:   Appropriate affect.  Alert and oriented x 3.   Assessment and plan:   1. 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 Will adjust to 5 u TID AC plus SSI as 1 u for every 50 >150. Discussed importance of carb consistency.  - continue libre 3  for now but interested in dexcom g7 15 d wear when available - DM maintenance: labs UTD. Eye UTD  The beneficiary [facility]) requires a therapeutic CGM. The beneficiary [facility]) has diabetes mellitus. She is insulin -treated with 3 or more daily injections (MDI) of insulin   The beneficiary's insulin  treatment regimen requires frequent adjustments by the beneficiary on the  basis of therapeutic CGM testing results. Ronal Olives Boeh has had life threatening hypoglycemia or is at risk for life threatening hypoglycemia. Glucose control is erratic and glycemic control would improve on therapeutic CGM.  The beneficiary has been using a home blood glucose monitor (BGM) and performing frequent (four or more times a day) BGM testing for past 30 days; Within six (6) months prior to ordering the CGM, the treating practitioner has an in-person visit with the beneficiary to evaluate their diabetes control and determine that the above criteria are met; and, Last Diabetes visit were done at the time of these A1cs: Lab Results  Component Value Date   HGBA1C 8.9 (H) 01/21/2024   HGBA1C 8.4 (H) 03/19/2023   Every six (6) months following the initial prescription of the CGM, the treating practitioner has an in-person visit with the beneficiary to assess adherence to their CGM regimen and diabetes    2. Acquired hypothyroidism, unspecified - continue 50 mcg daily  This video encounter was conducted with the patient's (or proxy's) verbal consent via secure, interactive audio and video telecommunications while away from clinic/office/hospital.  The  patient (or proxy) was instructed to have this encounter in a suitably private space and to only have persons present to whom they give permission to participate. In addition, patient identity was confirmed by use of name plus two identifiers.  28-Jun-2020 E&M) This visit was coded based on time. I spent a total of 30 minutes in both face-to-face and non-face-to-face activities for this visit on the date of this encounter.      Attestation Statement:   I personally performed the service, non-incident to. (WP)   JENNA NAT FOUNDS, PA Electronically signed by Andriette Founds, PA-C.   05/31/2024

## 2024-06-02 ENCOUNTER — Other Ambulatory Visit: Payer: Self-pay

## 2024-06-02 ENCOUNTER — Ambulatory Visit: Admitting: Physician Assistant

## 2024-06-02 DIAGNOSIS — S8001XA Contusion of right knee, initial encounter: Secondary | ICD-10-CM

## 2024-06-02 DIAGNOSIS — M25561 Pain in right knee: Secondary | ICD-10-CM

## 2024-06-02 NOTE — Progress Notes (Signed)
 Office Visit Note   Patient: Michelle Nicholson           Date of Birth: 1961/05/06           MRN: [id number] Visit Date: 06/02/2024              Requested by: Darilyn Rosalva Bruckner, PA-C [id number] [place],  [place] [address] PCP: Podraza, Cole Christopher, PA-C  Chief Complaint  Patient presents with   Right Foot - Routine Post Op    03/25/2024 right 3rd toe amputation      HPI: 63 y/o female with history of right third toe osteomyelitis.  She is s/p third toe amputation on 03/25/24.  Prior to the amputation she had a lower extremity angiogram by Dr. Lanis with right common iliac artery stenting, SFA balloon lithotripsy, and drug eluted stenting of the SFA  to improve her inflow to the right foot to assist with healing.  Based on her follow visit with Vascular  her ABIs on the right appear improved from 0.51 to 0.95.  Her toe pressure on the right has increased from 51 to 59 mmHg.              She is here for incision check.  She has been applying silver alginate over the open incision and dry dressing.  She places a new nitroglycerine patch over th inflow AT/DP dorsal foot area to increase the blood flow to the wound every 24 hours.   She States that her dog ran into her right knee on the lateral side and it is still very painful.  She broke a bone on the right knee, possibly the patella many years ago and wanted to make sure she did not injury herself again.   Assessment & Plan: Visit Diagnoses:  1. Acute pain of right knee   2. Contusion of right knee, initial encounter     Plan: elevation multiple times a day to decrease edema in the right foot, and to decrease maceration of the wound. Vashe wet to dry packing in the wound with ace wrap compression. Minimal weight bearing.  Cont Nitrodur patch along the AT/DP pathway anterior ankle and foot dorsum daily.  The right knee is stable with contusion.    Follow-Up Instructions: No follow-ups on file.   Ortho  Exam  Patient is alert, oriented, no adenopathy, well-dressed, normal affect, normal respiratory effort. The wounds appear to be getting smaller. No cellulitis or active drainage. Doppler signal in the DP. Moderate edema in the foot. With consent I debrided the peri wound of callus formation.  The wound bed was cleaned with 4 x 4 -Q-tip and Vashe.  100% granulation tissue at the wound base.  5 mm depth.      The right knee is tender laterally over the fibular head and patella.  No ecchymosis or cellulitis.  The patella is mobile and does not cause pain.  No joint line tenderness or effusion.   Imaging: No tissue edema on x ary surrounding the knee joint or patella.  No fracture noted.  Labs: Lab Results  Component Value Date   HGBA1C 8.5 (H) 03/19/2024   HGBA1C 7.8 (H) 11/28/2021   HGBA1C 9.0 (H) 04/24/2021   ESRSEDRATE 59 (H) 03/19/2024   CRP 1.0 (H) 03/19/2024   REPTSTATUS 03/24/2024 FINAL 03/19/2024   GRAMSTAIN  03/05/2023    FEW WBC PRESENT,BOTH PMN AND MONONUCLEAR NO ORGANISMS SEEN    GRAMSTAIN  03/05/2023    FEW WBC  PRESENT, PREDOMINANTLY PMN NO ORGANISMS SEEN    CULT  03/19/2024    NO GROWTH 5 DAYS Performed at [facility], [address], [place], [place] [address]      Lab Results  Component Value Date   ALBUMIN  3.0 (L) 03/22/2024   ALBUMIN  3.2 (L) 03/18/2024   ALBUMIN  3.8 01/23/2024    Lab Results  Component Value Date   MG 1.7 03/25/2024   MG 1.7 03/24/2024   MG 2.1 04/21/2021   MG 2.2 04/21/2021   No results found for: VD25OH  No results found for: PREALBUMIN    Latest Ref Rng & Units 05/02/2024    7:35 AM 03/27/2024    2:20 AM 03/26/2024    3:04 AM  CBC EXTENDED  WBC 4.0 - 10.5 K/uL   7.6   RBC 3.87 - 5.11 MIL/uL   2.84   Hemoglobin 12.0 - 15.0 g/dL 9.9  9.0  8.5   HCT 63.9 - 46.0 % 29.0  27.9  26.1   Platelets 150 - 400 K/uL   275      There is no height or weight on file to calculate BMI.  Orders:  Orders Placed This  Encounter  Procedures   XR Knee 1-2 Views Right   No orders of the defined types were placed in this encounter.    Procedures: No procedures performed  Clinical Data: No additional findings.  ROS:  All other systems negative, except as noted in the HPI. Review of Systems  Objective: Vital Signs: There were no vitals taken for this visit.  Specialty Comments:  No specialty comments available.  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) 03/19/2024   Arthritis of right hip, degenerative labral tear 03/07/2023   Labral tear of right hip joint 03/07/2023   Right hip pain 03/04/2023   Pain in right hip 03/04/2023   Carotid artery stenosis 12/02/2021   Pyuria 11/29/2021   Preop cardiovascular exam    Acute ischemic stroke (HCC) 11/28/2021   Recurrent UTI 11/28/2021   Internal carotid artery stenosis, left 11/28/2021   Hypokalemia 11/28/2021   Dyslipidemia 11/28/2021   Tobacco abuse 11/28/2021   Fatigue 11/28/2021   Severe sepsis (HCC) 04/21/2021   Shortness of breath 04/21/2021   Acute respiratory failure with hypoxia (HCC) 04/21/2021   Chronic hyponatremia 04/21/2021   AKI (acute kidney injury) 04/21/2021   Pneumonia 04/21/2021   Bilateral pneumonia 04/20/2021   Uncontrolled diabetes mellitus with hyperglycemia, with long-term current use of insulin  (HCC) 12/09/2015   Essential hypertension 12/09/2015   Meniere syndrome 12/09/2015   Past Medical History:  Diagnosis Date   Anemia    Arthritis    Arthritis of right hip 03/07/2023   Cancer (HCC)    skin ca, squamous, basal   COPD (chronic obstructive pulmonary disease) (HCC)    Diabetes mellitus    Dyspnea    Family history of adverse reaction to anesthesia     `mother - N/V, Brother N/V   Hypertension    Hypothyroidism    Labral tear of right hip joint 03/07/2023   Neuropathy    Pancreas disorder    Peripheral vascular disease    Pneumonia    04/2021, 2003   Stroke [facility]) 2017  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, equilibrium    Family History  Problem Relation Age of Onset   Cancer Mother    Hypertension Father    Diabetes Father    Cancer Sister    Diabetes Sister    Diabetes Brother    Kidney failure Brother    CAD Brother    Diabetes Other     Past Surgical History:  Procedure Laterality Date   ABDOMINAL AORTOGRAM N/A 03/23/2024   Procedure: ABDOMINAL AORTOGRAM;  Surgeon: Lanis Fonda BRAVO, MD;  Location: [facility] INVASIVE CV LAB;  Service: Cardiovascular;  Laterality: N/A;   ABDOMINAL AORTOGRAM N/A 05/02/2024   Procedure: ABDOMINAL AORTOGRAM;  Surgeon: Sheree Penne Bruckner, MD;  Location: [facility] INVASIVE CV LAB;  Service: Cardiovascular;  Laterality: N/A;   AMPUTATION TOE Right 03/25/2024   Procedure: AMPUTATION, TOE;  Surgeon: Harden Jerona GAILS, MD;  Location: [facility] OR;  Service: Orthopedics;  Laterality: Right;  RIGHT 3RD TOE AMPUTATION   CARDIAC CATHETERIZATION     ENDARTERECTOMY Left 12/02/2021   Procedure: LEFT CAROTID ENDARTERECTOMY;  Surgeon: Lanis Fonda BRAVO, MD;  Location: [facility] OR;  Service: Vascular;  Laterality: Left;   LOWER EXTREMITY ANGIOGRAPHY N/A 03/23/2024   Procedure: Lower Extremity Angiography;  Surgeon: Lanis Fonda BRAVO, MD;  Location: [facility] INVASIVE CV LAB;  Service: Cardiovascular;  Laterality: N/A;   LOWER EXTREMITY ANGIOGRAPHY N/A 05/02/2024   Procedure: Lower Extremity Angiography;  Surgeon: Sheree Penne Bruckner, MD;  Location: [facility] INVASIVE CV LAB;  Service: Cardiovascular;  Laterality: N/A;   LOWER EXTREMITY INTERVENTION  03/23/2024   Procedure: LOWER EXTREMITY INTERVENTION;  Surgeon: Lanis Fonda BRAVO, MD;  Location: [facility] INVASIVE CV LAB;  Service: Cardiovascular;;   PATCH ANGIOPLASTY Left 12/02/2021   Procedure: PATCH  ANGIOPLASTY USING [employer]GE [employer] 1cm x 6cm PATCH;  Surgeon: Lanis Fonda BRAVO, MD;  Location: [facility] OR;  Service: Vascular;  Laterality: Left;   Social History   Occupational History   Not on file  Tobacco Use   Smoking status: Former    Current packs/day: 0.00    Types: Cigarettes    Quit date: 11/27/2021    Years since quitting: 2.5   Smokeless tobacco: Never  Vaping Use   Vaping status: Never Used  Substance and Sexual Activity   Alcohol use: No    Comment: 2 drinks a month   Drug use: No   Sexual activity: Not on file

## 2024-06-23 ENCOUNTER — Ambulatory Visit: Admitting: Physician Assistant

## 2024-06-23 ENCOUNTER — Encounter: Payer: Self-pay | Admitting: Physician Assistant

## 2024-06-23 DIAGNOSIS — T8789 Other complications of amputation stump: Secondary | ICD-10-CM

## 2024-06-23 NOTE — Progress Notes (Signed)
 Office Visit Note   Patient: Michelle Nicholson           Date of Birth: 1961-04-08           MRN: [id number] Visit Date: 06/23/2024              Requested by: Darilyn Rosalva Bruckner, PA-C [address] [place],  [place] [address] PCP: Podraza, Cole Christopher, PA-C  Chief Complaint  Patient presents with   Right Foot - Routine Post Op    03/25/2024 right 3rd toe amputation      HPI: 63 y/o female with history of right third toe osteomyelitis.  She is s/p third toe amputation on 03/25/24.  Prior to the amputation she had a lower extremity angiogram by Dr. Lanis with right common iliac artery stenting, SFA balloon lithotripsy, and drug eluted stenting of the SFA  to improve her inflow to the right foot to assist with healing.  Based on her follow visit with Vascular  her ABIs on the right appear improved from 0.51 to 0.95.  Her toe pressure on the right has increased from 51 to 59 mmHg.              She is here for incision check. She has still been using Vashe daily and a dry dressing.  She still has very minimal yellowish drainage on the guaze daily.  She denies fever and chills.  Of note she told me she had a blood vessel burst behind her left eye and has mild vision loss.  She had an injection in the eye to stop the bleeding.    Assessment & Plan: Visit Diagnoses:  1. Delayed surgical wound healing of toe amputation stump (HCC)     Plan: Slowly healing right third toe amputation site.  No cellulitis or edema.  She will alternate Betadine  with Vashe and a Band aide.  She will start  a walking program to increase the inflow demand.  If she develops cellulitis, increased edema or pain she will call.  Otherwise she will follow up in 3 weeks.    Follow-Up Instructions: Return in about 3 weeks (around 07/14/2024).   Ortho Exam  Patient is alert, oriented, no adenopathy, well-dressed, normal affect, normal respiratory effort. The third toe amputation site has healed with a distal  pin point opening that is approximately 2 mm deep.  No cellulitis or edema.  She has brisk dopplers to the ankle AT/PT.  Monophasic weak signals in the DP.    Last ABI was performed 04/28/24 +---------+------------------+-----+----------+--------+  Right   Rt Pressure (mmHg)IndexWaveform  Comment   +---------+------------------+-----+----------+--------+  Brachial 143                                        +---------+------------------+-----+----------+--------+  PTA     119               0.80 monophasic          +---------+------------------+-----+----------+--------+  DP      142               0.95 monophasic          +---------+------------------+-----+----------+--------+  Great Toe59                0.40 Abnormal            +---------+------------------+-----+----------+--------+     Imaging: No results found. No  images are attached to the encounter.  Labs: Lab Results  Component Value Date   HGBA1C 8.5 (H) 03/19/2024   HGBA1C 7.8 (H) 11/28/2021   HGBA1C 9.0 (H) 04/24/2021   ESRSEDRATE 59 (H) 03/19/2024   CRP 1.0 (H) 03/19/2024   REPTSTATUS 03/24/2024 FINAL 03/19/2024   GRAMSTAIN  03/05/2023    FEW WBC PRESENT,BOTH PMN AND MONONUCLEAR NO ORGANISMS SEEN    GRAMSTAIN  03/05/2023    FEW WBC PRESENT, PREDOMINANTLY PMN NO ORGANISMS SEEN    CULT  03/19/2024    NO GROWTH 5 DAYS Performed at [facility], [address] [place], [place] [address]      Lab Results  Component Value Date   ALBUMIN  3.0 (L) 03/22/2024   ALBUMIN  3.2 (L) 03/18/2024   ALBUMIN  3.8 01/23/2024    Lab Results  Component Value Date   MG 1.7 03/25/2024   MG 1.7 03/24/2024   MG 2.1 04/21/2021   MG 2.2 04/21/2021   No results found for: VD25OH  No results found for: PREALBUMIN    Latest Ref Rng & Units 05/02/2024    7:35 AM 03/27/2024    2:20 AM 03/26/2024    3:04 AM  CBC EXTENDED  WBC 4.0 - 10.5 K/uL   7.6   RBC 3.87 - 5.11 MIL/uL   2.84    Hemoglobin 12.0 - 15.0 g/dL 9.9  9.0  8.5   HCT 63.9 - 46.0 % 29.0  27.9  26.1   Platelets 150 - 400 K/uL   275      There is no height or weight on file to calculate BMI.  Orders:  No orders of the defined types were placed in this encounter.  No orders of the defined types were placed in this encounter.    Procedures: No procedures performed  Clinical Data: No additional findings.  ROS:  All other systems negative, except as noted in the HPI. Review of Systems  Objective: Vital Signs: There were no vitals taken for this visit.  Specialty Comments:  No specialty comments available.  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) 03/19/2024   Arthritis of right hip, degenerative labral tear 03/07/2023   Labral tear of right hip joint 03/07/2023   Right hip pain 03/04/2023   Pain in right hip 03/04/2023   Carotid artery stenosis 12/02/2021   Pyuria 11/29/2021   Preop cardiovascular exam    Acute ischemic stroke (HCC) 11/28/2021   Recurrent UTI 11/28/2021   Internal carotid artery stenosis, left 11/28/2021   Hypokalemia 11/28/2021   Dyslipidemia 11/28/2021   Tobacco abuse 11/28/2021   Fatigue 11/28/2021   Severe sepsis (HCC) 04/21/2021   Shortness of breath 04/21/2021   Acute respiratory failure with hypoxia (HCC) 04/21/2021   Chronic hyponatremia 04/21/2021   AKI (acute kidney injury) 04/21/2021   Pneumonia 04/21/2021   Bilateral pneumonia 04/20/2021   Uncontrolled diabetes mellitus with hyperglycemia, with long-term current use of insulin  (HCC) 12/09/2015   Essential hypertension 12/09/2015   Meniere syndrome 12/09/2015   Past Medical History:  Diagnosis Date   Anemia    Arthritis    Arthritis of  right hip 03/07/2023   Cancer (HCC)    skin ca, squamous, basal   COPD (chronic obstructive pulmonary  disease) (HCC)    Diabetes mellitus    Dyspnea    Family history of adverse reaction to anesthesia    `mother - N/V, Brother N/V   Hypertension    Hypothyroidism    Labral tear of right hip joint 03/07/2023   Neuropathy    Pancreas disorder    Peripheral vascular disease    Pneumonia    04/2021, 2003   Stroke [facility]) 2017   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, equilibrium    Family History  Problem Relation Age of Onset   Cancer Mother    Hypertension Father    Diabetes Father    Cancer Sister    Diabetes Sister    Diabetes Brother    Kidney failure Brother    CAD Brother    Diabetes Other     Past Surgical History:  Procedure Laterality Date   ABDOMINAL AORTOGRAM N/A 03/23/2024   Procedure: ABDOMINAL AORTOGRAM;  Surgeon: Lanis Fonda BRAVO, MD;  Location: [facility] INVASIVE CV LAB;  Service: Cardiovascular;  Laterality: N/A;   ABDOMINAL AORTOGRAM N/A 05/02/2024   Procedure: ABDOMINAL AORTOGRAM;  Surgeon: Sheree Penne Bruckner, MD;  Location: [facility] INVASIVE CV LAB;  Service: Cardiovascular;  Laterality: N/A;   AMPUTATION TOE Right 03/25/2024   Procedure: AMPUTATION, TOE;  Surgeon: Harden Jerona GAILS, MD;  Location: [facility] OR;  Service: Orthopedics;  Laterality: Right;  RIGHT 3RD TOE AMPUTATION   CARDIAC CATHETERIZATION     ENDARTERECTOMY Left 12/02/2021   Procedure: LEFT CAROTID ENDARTERECTOMY;  Surgeon: Lanis Fonda BRAVO, MD;  Location: [facility] OR;  Service: Vascular;  Laterality: Left;   LOWER EXTREMITY ANGIOGRAPHY N/A 03/23/2024   Procedure: Lower Extremity Angiography;  Surgeon: Lanis Fonda BRAVO, MD;  Location: [facility] INVASIVE CV LAB;  Service: Cardiovascular;  Laterality: N/A;   LOWER EXTREMITY ANGIOGRAPHY N/A 05/02/2024   Procedure: Lower Extremity Angiography;  Surgeon: Sheree Penne Bruckner, MD;  Location: [facility] INVASIVE CV LAB;  Service: Cardiovascular;  Laterality: N/A;   LOWER EXTREMITY INTERVENTION   03/23/2024   Procedure: LOWER EXTREMITY INTERVENTION;  Surgeon: Lanis Fonda BRAVO, MD;  Location: [facility] INVASIVE CV LAB;  Service: Cardiovascular;;   PATCH ANGIOPLASTY Left 12/02/2021   Procedure: PATCH ANGIOPLASTY USING GEORGE BI[employer] 1cm x 6cm PATCH;  Surgeon: Lanis Fonda BRAVO, MD;  Location: [facility] OR;  Service: Vascular;  Laterality: Left;   Social History   Occupational History   Not on file  Tobacco Use   Smoking status: Former    Current packs/day: 0.00    Types: Cigarettes    Quit date: 11/27/2021    Years since quitting: 2.5   Smokeless tobacco: Never  Vaping Use   Vaping status: Never Used  Substance and Sexual Activity   Alcohol use: No    Comment: 2 drinks a month   Drug use: No   Sexual activity: Not on file

## 2024-07-12 ENCOUNTER — Other Ambulatory Visit: Payer: Self-pay

## 2024-07-12 DIAGNOSIS — I739 Peripheral vascular disease, unspecified: Secondary | ICD-10-CM

## 2024-07-15 ENCOUNTER — Encounter: Payer: Self-pay | Admitting: Physician Assistant

## 2024-07-15 ENCOUNTER — Ambulatory Visit: Admitting: Physician Assistant

## 2024-07-15 DIAGNOSIS — T8789 Other complications of amputation stump: Secondary | ICD-10-CM | POA: Diagnosis not present

## 2024-07-15 NOTE — Progress Notes (Signed)
 "  Office Visit Note   Patient: Michelle Nicholson           Date of Birth: 1961/06/27           MRN: [id number] Visit Date: 07/15/2024              Requested by: Darilyn Rosalva Bruckner, PA-C [address] [place],  [place] [address] PCP: Podraza, Cole Christopher, PA-C  Chief Complaint  Patient presents with   Right Foot - Follow-up      HPI: 64 y/o female with history of right third toe osteomyelitis.  She is s/p third toe amputation on 03/25/24.  Prior to the amputation she had a lower extremity angiogram by Dr. Lanis with right common iliac artery stenting, SFA balloon lithotripsy, and drug eluted stenting of the SFA  to improve her inflow to the right foot to assist with healing.  She denies drainage, erythema or open wounds.  She has worn a pair of dress shoes over the Christmas holiday.    Assessment & Plan: Visit Diagnoses:  1. Delayed surgical wound healing of toe amputation stump (HCC)     Plan: Activity as tolerates.  She wear as tolerates.  Elevate as needed for edema which can occur on/off for 6 months.   Moisturize: Apply lotion to tops, bottoms, and heels, but not between toes.  Follow-Up Instructions: Return if symptoms worsen or fail to improve.   Ortho Exam  Patient is alert, oriented, no adenopathy, well-dressed, normal affect, normal respiratory effort. Minimal edema in the right forefoot compared to the left.  Well healed incision without cellulitis.  Palpable scar tissue along the incision.         Imaging: No results found. No images are attached to the encounter.  Labs: Lab Results  Component Value Date   HGBA1C 8.5 (H) 03/19/2024   HGBA1C 7.8 (H) 11/28/2021   HGBA1C 9.0 (H) 04/24/2021   ESRSEDRATE 59 (H) 03/19/2024   CRP 1.0 (H) 03/19/2024   REPTSTATUS 03/24/2024 FINAL 03/19/2024   GRAMSTAIN  03/05/2023    FEW WBC PRESENT,BOTH PMN AND MONONUCLEAR NO ORGANISMS SEEN    GRAMSTAIN  03/05/2023    FEW WBC PRESENT, PREDOMINANTLY  PMN NO ORGANISMS SEEN    CULT  03/19/2024    NO GROWTH 5 DAYS Performed at [facility], [address], [place], [place] [address]      Lab Results  Component Value Date   ALBUMIN  3.0 (L) 03/22/2024   ALBUMIN  3.2 (L) 03/18/2024   ALBUMIN  3.8 01/23/2024    Lab Results  Component Value Date   MG 1.7 03/25/2024   MG 1.7 03/24/2024   MG 2.1 04/21/2021   MG 2.2 04/21/2021   No results found for: VD25OH  No results found for: PREALBUMIN    Latest Ref Rng & Units 05/02/2024    7:35 AM 03/27/2024    2:20 AM 03/26/2024    3:04 AM  CBC EXTENDED  WBC 4.0 - 10.5 K/uL   7.6   RBC 3.87 - 5.11 MIL/uL   2.84   Hemoglobin 12.0 - 15.0 g/dL 9.9  9.0  8.5   HCT 63.9 - 46.0 % 29.0  27.9  26.1   Platelets 150 - 400 K/uL   275      There is no height or weight on file to calculate BMI.  Orders:  No orders of the defined types were placed in this encounter.  No orders of the defined types were placed in  this encounter.    Procedures: No procedures performed  Clinical Data: No additional findings.  ROS:  All other systems negative, except as noted in the HPI. Review of Systems  Objective: Vital Signs: There were no vitals taken for this visit.  Specialty Comments:  No specialty comments available.  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) 03/19/2024   Arthritis of right hip, degenerative labral tear 03/07/2023   Labral tear of right hip joint 03/07/2023   Right hip pain 03/04/2023   Pain in right hip 03/04/2023   Carotid artery stenosis 12/02/2021   Pyuria 11/29/2021   Preop cardiovascular exam    Acute ischemic stroke (HCC) 11/28/2021   Recurrent UTI 11/28/2021   Internal carotid artery  stenosis, left 11/28/2021   Hypokalemia 11/28/2021   Dyslipidemia 11/28/2021   Tobacco abuse 11/28/2021   Fatigue 11/28/2021   Severe sepsis (HCC) 04/21/2021   Shortness of breath 04/21/2021   Acute respiratory failure with hypoxia (HCC) 04/21/2021   Chronic hyponatremia 04/21/2021   AKI (acute kidney injury) 04/21/2021   Pneumonia 04/21/2021   Bilateral pneumonia 04/20/2021   Uncontrolled diabetes mellitus with hyperglycemia, with long-term current use of insulin  (HCC) 12/09/2015   Essential hypertension 12/09/2015   Meniere syndrome 12/09/2015   Past Medical History:  Diagnosis Date   Anemia    Arthritis    Arthritis of right hip 03/07/2023   Cancer (HCC)    skin ca, squamous, basal   COPD (chronic obstructive pulmonary disease) (HCC)    Diabetes mellitus    Dyspnea    Family history of adverse reaction to anesthesia    `mother - N/V, Brother N/V   Hypertension    Hypothyroidism    Labral tear of right hip joint 03/07/2023   Neuropathy    Pancreas disorder    Peripheral vascular disease    Pneumonia    04/2021, 2003   Stroke [facility]) 2017   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, equilibrium    Family History  Problem Relation Age of Onset   Cancer Mother    Hypertension Father    Diabetes Father    Cancer Sister    Diabetes Sister    Diabetes Brother    Kidney failure Brother    CAD Brother    Diabetes Other     Past Surgical History:  Procedure Laterality Date   ABDOMINAL AORTOGRAM N/A 03/23/2024   Procedure: ABDOMINAL AORTOGRAM;  Surgeon: Lanis Fonda BRAVO, MD;  Location: [facility] INVASIVE CV LAB;  Service: Cardiovascular;  Laterality: N/A;   ABDOMINAL AORTOGRAM N/A 05/02/2024   Procedure: ABDOMINAL AORTOGRAM;  Surgeon: Sheree Penne Bruckner, MD;  Location: [facility] INVASIVE CV LAB;  Service: Cardiovascular;  Laterality: N/A;   AMPUTATION TOE Right 03/25/2024   Procedure: AMPUTATION, TOE;  Surgeon: Harden Jerona GAILS, MD;  Location: [facility] OR;  Service: Orthopedics;  Laterality: Right;   RIGHT 3RD TOE AMPUTATION   CARDIAC CATHETERIZATION     ENDARTERECTOMY Left 12/02/2021   Procedure: LEFT CAROTID ENDARTERECTOMY;  Surgeon: Lanis Fonda BRAVO, MD;  Location: [facility] OR;  Service: Vascular;  Laterality: Left;   LOWER EXTREMITY ANGIOGRAPHY N/A 03/23/2024   Procedure: Lower Extremity Angiography;  Surgeon: Lanis Fonda BRAVO, MD;  Location: [facility] INVASIVE CV LAB;  Service: Cardiovascular;  Laterality:  N/A;   LOWER EXTREMITY ANGIOGRAPHY N/A 05/02/2024   Procedure: Lower Extremity Angiography;  Surgeon: Sheree Penne Bruckner, MD;  Location: [facility] INVASIVE CV LAB;  Service: Cardiovascular;  Laterality: N/A;   LOWER EXTREMITY INTERVENTION  03/23/2024   Procedure: LOWER EXTREMITY INTERVENTION;  Surgeon: Lanis Fonda BRAVO, MD;  Location: [facility] INVASIVE CV LAB;  Service: Cardiovascular;;   PATCH ANGIOPLASTY Left 12/02/2021   Procedure: PATCH ANGIOPLASTY USING GE[employer]GE [employer] 1cm x 6cm PATCH;  Surgeon: Lanis Fonda BRAVO, MD;  Location: [facility] OR;  Service: Vascular;  Laterality: Left;   Social History   Occupational History   Not on file  Tobacco Use   Smoking status: Former    Current packs/day: 0.00    Types: Cigarettes    Quit date: 11/27/2021    Years since quitting: 2.6   Smokeless tobacco: Never  Vaping Use   Vaping status: Never Used  Substance and Sexual Activity   Alcohol use: No    Comment: 2 drinks a month   Drug use: No   Sexual activity: Not on file       "

## 2024-07-22 ENCOUNTER — Telehealth: Payer: Self-pay

## 2024-07-22 NOTE — Telephone Encounter (Signed)
 Patient called asking about holding her Plavix  and aspirin  for eye surgery to be done on 07/26/24.  Advised she can hold Plavix  5 days and continue to take the aspirin .

## 2024-07-28 ENCOUNTER — Ambulatory Visit: Admitting: Physician Assistant

## 2024-07-28 DIAGNOSIS — E08621 Diabetes mellitus due to underlying condition with foot ulcer: Secondary | ICD-10-CM

## 2024-07-28 DIAGNOSIS — L97511 Non-pressure chronic ulcer of other part of right foot limited to breakdown of skin: Secondary | ICD-10-CM | POA: Diagnosis not present

## 2024-07-29 ENCOUNTER — Encounter: Payer: Self-pay | Admitting: Physician Assistant

## 2024-07-29 NOTE — Progress Notes (Signed)
 "  Office Visit Note   Patient: Michelle Nicholson           Date of Birth: 1960/07/25           MRN: [id number] Visit Date: 07/28/2024              Requested by: Darilyn Rosalva Bruckner, PA-C [address] [place],  [place] [address] PCP: Podraza, Cole Christopher, PA-C  Chief Complaint  Patient presents with   Right Foot - Wound Check      HPI: 64 y/o female who has recently recovered from right third toe osteomyelitis.   She is s/p third toe amputation on 03/25/24.  She put on some cute shoes for an event and now has a new ulcer on the second toe of the right foot.  She denies fever and chills.  She is wearing a silicon band around the toe for protection.    Prior to the amputation she had a lower extremity angiogram by Dr. Lanis with right common iliac artery stenting, SFA balloon lithotripsy, and drug eluted stenting of the SFA  to improve her inflow to the right foot to assist with healing.   Assessment & Plan: Visit Diagnoses:  1. Diabetic ulcer of toe of right foot associated with diabetes mellitus due to underlying condition, limited to breakdown of skin (HCC)     Plan: Second toe ulcer with DM and known PAD Vashe cleaning, wet to dry Vashe on band aide daily.   She will use a Nitrodur patch daily along the DP artery q 24 hours.    Follow-Up Instructions: Return in about 15 days (around 08/12/2024).   Ortho Exam  Patient is alert, oriented, no adenopathy, well-dressed, normal affect, normal respiratory effort. Distal second toe ulcer without cellulitis or drainage. No edema in the toe.  Doppler signals multiphasic flow in the DP/PT.      Imaging: No results found.    Labs: Lab Results  Component Value Date   HGBA1C 8.5 (H) 03/19/2024   HGBA1C 7.8 (H) 11/28/2021   HGBA1C 9.0 (H) 04/24/2021   ESRSEDRATE 59 (H) 03/19/2024   CRP 1.0 (H) 03/19/2024   REPTSTATUS 03/24/2024 FINAL 03/19/2024   GRAMSTAIN  03/05/2023    FEW WBC PRESENT,BOTH PMN AND  MONONUCLEAR NO ORGANISMS SEEN    GRAMSTAIN  03/05/2023    FEW WBC PRESENT, PREDOMINANTLY PMN NO ORGANISMS SEEN    CULT  03/19/2024    NO GROWTH 5 DAYS Performed at [facility], [address], [place], [place] [address]      Lab Results  Component Value Date   ALBUMIN  3.0 (L) 03/22/2024   ALBUMIN  3.2 (L) 03/18/2024   ALBUMIN  3.8 01/23/2024    Lab Results  Component Value Date   MG 1.7 03/25/2024   MG 1.7 03/24/2024   MG 2.1 04/21/2021   MG 2.2 04/21/2021   No results found for: VD25OH  No results found for: PREALBUMIN    Latest Ref Rng & Units 05/02/2024    7:35 AM 03/27/2024    2:20 AM 03/26/2024    3:04 AM  CBC EXTENDED  WBC 4.0 - 10.5 K/uL   7.6   RBC 3.87 - 5.11 MIL/uL   2.84   Hemoglobin 12.0 - 15.0 g/dL 9.9  9.0  8.5   HCT 63.9 - 46.0 % 29.0  27.9  26.1   Platelets 150 - 400 K/uL   275      There is no height or weight on  file to calculate BMI.  Orders:  No orders of the defined types were placed in this encounter.  No orders of the defined types were placed in this encounter.    Procedures: No procedures performed  Clinical Data: No additional findings.  ROS:  All other systems negative, except as noted in the HPI. Review of Systems  Objective: Vital Signs: There were no vitals taken for this visit.  Specialty Comments:  No specialty comments available.  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) 03/19/2024   Arthritis of right hip, degenerative labral tear 03/07/2023   Labral tear of right hip joint 03/07/2023   Right hip pain 03/04/2023   Pain in right hip 03/04/2023   Carotid artery stenosis 12/02/2021   Pyuria 11/29/2021   Preop cardiovascular exam    Acute  ischemic stroke (HCC) 11/28/2021   Recurrent UTI 11/28/2021   Internal carotid artery stenosis, left 11/28/2021   Hypokalemia 11/28/2021   Dyslipidemia 11/28/2021   Tobacco abuse 11/28/2021   Fatigue 11/28/2021   Severe sepsis (HCC) 04/21/2021   Shortness of breath 04/21/2021   Acute respiratory failure with hypoxia (HCC) 04/21/2021   Chronic hyponatremia 04/21/2021   AKI (acute kidney injury) 04/21/2021   Pneumonia 04/21/2021   Bilateral pneumonia 04/20/2021   Uncontrolled diabetes mellitus with hyperglycemia, with long-term current use of insulin  (HCC) 12/09/2015   Essential hypertension 12/09/2015   Meniere syndrome 12/09/2015   Past Medical History:  Diagnosis Date   Anemia    Arthritis    Arthritis of right hip 03/07/2023   Cancer (HCC)    skin ca, squamous, basal   COPD (chronic obstructive pulmonary disease) (HCC)    Diabetes mellitus    Dyspnea    Family history of adverse reaction to anesthesia    `mother - N/V, Brother N/V   Hypertension    Hypothyroidism    Labral tear of right hip joint 03/07/2023   Neuropathy    Pancreas disorder    Peripheral vascular disease    Pneumonia    04/2021, 2003   Stroke [facility]) 2017   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, equilibrium    Family History  Problem Relation Age of Onset   Cancer Mother    Hypertension Father    Diabetes Father    Cancer Sister    Diabetes Sister    Diabetes Brother    Kidney failure Brother    CAD Brother    Diabetes Other     Past Surgical History:  Procedure Laterality Date   ABDOMINAL AORTOGRAM N/A 03/23/2024   Procedure: ABDOMINAL AORTOGRAM;  Surgeon: Lanis Fonda BRAVO, MD;  Location: [facility] INVASIVE CV LAB;  Service: Cardiovascular;  Laterality: N/A;   ABDOMINAL AORTOGRAM N/A 05/02/2024   Procedure: ABDOMINAL AORTOGRAM;  Surgeon: Sheree Penne Bruckner, MD;  Location: [facility] INVASIVE CV LAB;  Service: Cardiovascular;  Laterality: N/A;   AMPUTATION TOE Right 03/25/2024   Procedure: AMPUTATION, TOE;   Surgeon: Harden Jerona GAILS, MD;  Location: [facility] OR;  Service: Orthopedics;  Laterality: Right;  RIGHT 3RD TOE AMPUTATION   CARDIAC CATHETERIZATION     ENDARTERECTOMY Left 12/02/2021   Procedure: LEFT CAROTID ENDARTERECTOMY;  Surgeon: Lanis Fonda BRAVO, MD;  Location: [facility] OR;  Service: Vascular;  Laterality: Left;  LOWER EXTREMITY ANGIOGRAPHY N/A 03/23/2024   Procedure: Lower Extremity Angiography;  Surgeon: Lanis Fonda BRAVO, MD;  Location: [facility] INVASIVE CV LAB;  Service: Cardiovascular;  Laterality: N/A;   LOWER EXTREMITY ANGIOGRAPHY N/A 05/02/2024   Procedure: Lower Extremity Angiography;  Surgeon: Sheree Penne Bruckner, MD;  Location: [facility] INVASIVE CV LAB;  Service: Cardiovascular;  Laterality: N/A;   LOWER EXTREMITY INTERVENTION  03/23/2024   Procedure: LOWER EXTREMITY INTERVENTION;  Surgeon: Lanis Fonda BRAVO, MD;  Location: [facility] INVASIVE CV LAB;  Service: Cardiovascular;;   PATCH ANGIOPLASTY Left 12/02/2021   Procedure: PATCH ANGIOPLASTY USING GE[employer]GE [employer] 1cm x 6cm PATCH;  Surgeon: Lanis Fonda BRAVO, MD;  Location: [facility] OR;  Service: Vascular;  Laterality: Left;   Social History   Occupational History   Not on file  Tobacco Use   Smoking status: Former    Current packs/day: 0.00    Types: Cigarettes    Quit date: 11/27/2021    Years since quitting: 2.6   Smokeless tobacco: Never  Vaping Use   Vaping status: Never Used  Substance and Sexual Activity   Alcohol use: No    Comment: 2 drinks a month   Drug use: No   Sexual activity: Not on file       "

## 2024-08-11 ENCOUNTER — Ambulatory Visit (HOSPITAL_COMMUNITY)

## 2024-08-11 ENCOUNTER — Ambulatory Visit (HOSPITAL_COMMUNITY): Admitting: Vascular Surgery

## 2024-08-12 ENCOUNTER — Ambulatory Visit: Admitting: Physician Assistant

## 2024-08-12 DIAGNOSIS — E08621 Diabetes mellitus due to underlying condition with foot ulcer: Secondary | ICD-10-CM | POA: Diagnosis not present

## 2024-08-12 DIAGNOSIS — L97511 Non-pressure chronic ulcer of other part of right foot limited to breakdown of skin: Secondary | ICD-10-CM | POA: Diagnosis not present

## 2024-08-12 NOTE — Progress Notes (Unsigned)
 "  Office Visit Note   Patient: Michelle Nicholson           Date of Birth: [birth year]           MRN: [id number] Visit Date: 08/12/2024              Requested by: Darilyn Rosalva Bruckner, PA-C [id number] [place],  [place] [address] PCP: Podraza, Cole Christopher, PA-C  Chief Complaint  Patient presents with   Right Foot - Follow-up      HPI:   Assessment & Plan: Visit Diagnoses: No diagnosis found.  Plan: ***  Follow-Up Instructions: No follow-ups on file.   Ortho Exam  Patient is alert, oriented, no adenopathy, well-dressed, normal affect, normal respiratory effort. ***    Imaging: No results found. No images are attached to the encounter.  Labs: Lab Results  Component Value Date   HGBA1C 8.5 (H) 03/19/2024   HGBA1C 7.8 (H) 11/28/2021   HGBA1C 9.0 (H) 04/24/2021   ESRSEDRATE 59 (H) 03/19/2024   CRP 1.0 (H) 03/19/2024   REPTSTATUS 03/24/2024 FINAL 03/19/2024   GRAMSTAIN  03/05/2023    FEW WBC PRESENT,BOTH PMN AND MONONUCLEAR NO ORGANISMS SEEN    GRAMSTAIN  03/05/2023    FEW WBC PRESENT, PREDOMINANTLY PMN NO ORGANISMS SEEN    CULT  03/19/2024    NO GROWTH 5 DAYS Performed at [facility], [address] [place], [place] [address]      Lab Results  Component Value Date   ALBUMIN  3.0 (L) 03/22/2024   ALBUMIN  3.2 (L) 03/18/2024   ALBUMIN  3.8 01/23/2024    Lab Results  Component Value Date   MG 1.7 03/25/2024   MG 1.7 03/24/2024   MG 2.1 04/21/2021   MG 2.2 04/21/2021   No results found for: VD25OH  No results found for: PREALBUMIN    Latest Ref Rng & Units 05/02/2024    7:35 AM 03/27/2024    2:20 AM 03/26/2024    3:04 AM  CBC EXTENDED  WBC 4.0 - 10.5 K/uL   7.6   RBC 3.87 - 5.11 MIL/uL   2.84   Hemoglobin 12.0 - 15.0 g/dL 9.9  9.0  8.5   HCT 63.9 - 46.0 % 29.0  27.9  26.1   Platelets 150 - 400 K/uL   275      There is no height or weight on file to calculate BMI.  Orders:  No orders of the defined types were  placed in this encounter.  No orders of the defined types were placed in this encounter.    Procedures: No procedures performed  Clinical Data: No additional findings.  ROS:  All other systems negative, except as noted in the HPI. Review of Systems  Objective: Vital Signs: There were no vitals taken for this visit.  Specialty Comments:  No specialty comments available.  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) 03/19/2024   Arthritis of right hip, degenerative labral tear 03/07/2023   Labral tear of right hip joint 03/07/2023   Right hip pain 03/04/2023   Pain in right hip 03/04/2023   Carotid artery stenosis 12/02/2021   Pyuria 11/29/2021   Preop cardiovascular exam    Acute ischemic stroke (HCC) 11/28/2021  Recurrent UTI 11/28/2021   Internal carotid artery stenosis, left 11/28/2021   Hypokalemia 11/28/2021   Dyslipidemia 11/28/2021   Tobacco abuse 11/28/2021   Fatigue 11/28/2021   Severe sepsis (HCC) 04/21/2021   Shortness of breath 04/21/2021   Acute respiratory failure with hypoxia (HCC) 04/21/2021   Chronic hyponatremia 04/21/2021   AKI (acute kidney injury) 04/21/2021   Pneumonia 04/21/2021   Bilateral pneumonia 04/20/2021   Uncontrolled diabetes mellitus with hyperglycemia, with long-term current use of insulin  (HCC) 12/09/2015   Essential hypertension 12/09/2015   Meniere syndrome 12/09/2015   Past Medical History:  Diagnosis Date   Anemia    Arthritis    Arthritis of right hip 03/07/2023   Cancer (HCC)    skin ca, squamous, basal   COPD (chronic obstructive pulmonary disease) (HCC)    Diabetes mellitus    Dyspnea    Family history of adverse reaction  to anesthesia    `mother - N/V, Brother N/V   Hypertension    Hypothyroidism    Labral tear of right hip joint 03/07/2023   Neuropathy    Pancreas disorder    Peripheral vascular disease    Pneumonia    04/2021, 2003   Stroke [facility]) 2017   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, equilibrium    Family History  Problem Relation Age of Onset   Cancer Mother    Hypertension Father    Diabetes Father    Cancer Sister    Diabetes Sister    Diabetes Brother    Kidney failure Brother    CAD Brother    Diabetes Other     Past Surgical History:  Procedure Laterality Date   ABDOMINAL AORTOGRAM N/A 03/23/2024   Procedure: ABDOMINAL AORTOGRAM;  Surgeon: Lanis Fonda BRAVO, MD;  Location: [facility] INVASIVE CV LAB;  Service: Cardiovascular;  Laterality: N/A;   ABDOMINAL AORTOGRAM N/A 05/02/2024   Procedure: ABDOMINAL AORTOGRAM;  Surgeon: Sheree Penne Bruckner, MD;  Location: [facility] INVASIVE CV LAB;  Service: Cardiovascular;  Laterality: N/A;   AMPUTATION TOE Right 03/25/2024   Procedure: AMPUTATION, TOE;  Surgeon: Harden Jerona GAILS, MD;  Location: [facility] OR;  Service: Orthopedics;  Laterality: Right;  RIGHT 3RD TOE AMPUTATION   CARDIAC CATHETERIZATION     ENDARTERECTOMY Left 12/02/2021   Procedure: LEFT CAROTID ENDARTERECTOMY;  Surgeon: Lanis Fonda BRAVO, MD;  Location: [facility] OR;  Service: Vascular;  Laterality: Left;   LOWER EXTREMITY ANGIOGRAPHY N/A 03/23/2024   Procedure: Lower Extremity Angiography;  Surgeon: Lanis Fonda BRAVO, MD;  Location: [facility] INVASIVE CV LAB;  Service: Cardiovascular;  Laterality: N/A;   LOWER EXTREMITY ANGIOGRAPHY N/A 05/02/2024   Procedure: Lower Extremity Angiography;  Surgeon: Sheree Penne Bruckner, MD;  Location: [facility] INVASIVE CV LAB;  Service: Cardiovascular;  Laterality: N/A;   LOWER EXTREMITY INTERVENTION  03/23/2024   Procedure: LOWER EXTREMITY INTERVENTION;  Surgeon: Lanis Fonda BRAVO, MD;  Location: [facility] INVASIVE CV LAB;  Service: Cardiovascular;;   PATCH ANGIOPLASTY  Left 12/02/2021   Procedure: PATCH ANGIOPLASTY USING [employer] 1cm x 6cm PATCH;  Surgeon: Lanis Fonda BRAVO, MD;  Location: [facility] OR;  Service: Vascular;  Laterality: Left;   Social History   Occupational History   Not on file  Tobacco Use   Smoking status: Former    Current packs/day: 0.00    Types: Cigarettes    Quit date: 11/27/2021    Years since quitting: 2.7   Smokeless tobacco: Never  Vaping Use   Vaping status: Never Used  Substance and Sexual Activity   Alcohol  use: No    Comment: 2 drinks a month   Drug use: No   Sexual activity: Not on file       "

## 2024-08-14 ENCOUNTER — Encounter: Payer: Self-pay | Admitting: Physician Assistant

## 2024-09-15 ENCOUNTER — Ambulatory Visit (HOSPITAL_COMMUNITY)

## 2024-09-15 ENCOUNTER — Ambulatory Visit: Admitting: Vascular Surgery
